# Patient Record
Sex: Male | Born: 1955 | Race: White | Hispanic: No | Marital: Married | Smoking: Former smoker
Health system: Southern US, Community
[De-identification: ages and names within clinical notes are randomized; demographics above are authoritative.]

## PROBLEM LIST (undated history)

## (undated) DIAGNOSIS — M1990 Unspecified osteoarthritis, unspecified site: Secondary | ICD-10-CM

## (undated) DIAGNOSIS — K279 Peptic ulcer, site unspecified, unspecified as acute or chronic, without hemorrhage or perforation: Secondary | ICD-10-CM

## (undated) DIAGNOSIS — I10 Essential (primary) hypertension: Secondary | ICD-10-CM

## (undated) DIAGNOSIS — W319XXA Contact with unspecified machinery, initial encounter: Secondary | ICD-10-CM

## (undated) DIAGNOSIS — D649 Anemia, unspecified: Secondary | ICD-10-CM

## (undated) DIAGNOSIS — C801 Malignant (primary) neoplasm, unspecified: Secondary | ICD-10-CM

## (undated) DIAGNOSIS — K219 Gastro-esophageal reflux disease without esophagitis: Secondary | ICD-10-CM

## (undated) DIAGNOSIS — K284 Chronic or unspecified gastrojejunal ulcer with hemorrhage: Secondary | ICD-10-CM

## (undated) DIAGNOSIS — K274 Chronic or unspecified peptic ulcer, site unspecified, with hemorrhage: Secondary | ICD-10-CM

## (undated) DIAGNOSIS — J189 Pneumonia, unspecified organism: Secondary | ICD-10-CM

## (undated) DIAGNOSIS — E43 Unspecified severe protein-calorie malnutrition: Secondary | ICD-10-CM

## (undated) DIAGNOSIS — G894 Chronic pain syndrome: Secondary | ICD-10-CM

## (undated) HISTORY — PX: JOINT REPLACEMENT: SHX530

## (undated) HISTORY — PX: TONGUE BIOPSY: SHX1075

## (undated) HISTORY — PX: LEG SURGERY: SHX1003

## (undated) HISTORY — PX: TONGUE SURGERY: SHX810

---

## 2000-11-16 ENCOUNTER — Inpatient Hospital Stay (HOSPITAL_COMMUNITY): Admission: EM | Admit: 2000-11-16 | Discharge: 2000-11-17 | Payer: Self-pay | Admitting: Emergency Medicine

## 2005-03-25 ENCOUNTER — Ambulatory Visit: Payer: Self-pay | Admitting: Otolaryngology

## 2005-03-26 ENCOUNTER — Other Ambulatory Visit: Payer: Self-pay

## 2005-03-27 ENCOUNTER — Inpatient Hospital Stay: Payer: Self-pay | Admitting: Otolaryngology

## 2005-04-10 ENCOUNTER — Ambulatory Visit: Payer: Self-pay | Admitting: Radiation Oncology

## 2005-05-10 ENCOUNTER — Ambulatory Visit: Payer: Self-pay | Admitting: Radiation Oncology

## 2005-05-26 ENCOUNTER — Emergency Department: Payer: Self-pay | Admitting: General Practice

## 2005-05-26 ENCOUNTER — Other Ambulatory Visit: Payer: Self-pay

## 2005-06-10 ENCOUNTER — Ambulatory Visit: Payer: Self-pay | Admitting: Radiation Oncology

## 2005-07-11 ENCOUNTER — Ambulatory Visit: Payer: Self-pay | Admitting: Radiation Oncology

## 2005-08-13 ENCOUNTER — Ambulatory Visit: Payer: Self-pay | Admitting: Radiation Oncology

## 2005-08-28 ENCOUNTER — Other Ambulatory Visit: Payer: Self-pay

## 2005-08-28 ENCOUNTER — Emergency Department: Payer: Self-pay | Admitting: Unknown Physician Specialty

## 2005-09-05 ENCOUNTER — Ambulatory Visit: Payer: Self-pay | Admitting: Radiation Oncology

## 2005-11-13 ENCOUNTER — Ambulatory Visit: Payer: Self-pay | Admitting: Radiation Oncology

## 2006-01-30 ENCOUNTER — Ambulatory Visit: Payer: Self-pay | Admitting: Radiation Oncology

## 2006-03-19 ENCOUNTER — Other Ambulatory Visit: Payer: Self-pay

## 2006-03-19 ENCOUNTER — Emergency Department: Payer: Self-pay | Admitting: Emergency Medicine

## 2006-03-24 ENCOUNTER — Ambulatory Visit: Payer: Self-pay | Admitting: Radiation Oncology

## 2006-04-03 ENCOUNTER — Ambulatory Visit: Payer: Self-pay | Admitting: Radiation Oncology

## 2006-04-10 ENCOUNTER — Ambulatory Visit: Payer: Self-pay | Admitting: Radiation Oncology

## 2006-05-25 ENCOUNTER — Ambulatory Visit: Payer: Self-pay | Admitting: Oncology

## 2006-06-10 ENCOUNTER — Ambulatory Visit: Payer: Self-pay | Admitting: Oncology

## 2006-08-17 ENCOUNTER — Inpatient Hospital Stay: Payer: Self-pay | Admitting: General Surgery

## 2006-09-15 ENCOUNTER — Ambulatory Visit: Payer: Self-pay | Admitting: Pain Medicine

## 2006-12-18 ENCOUNTER — Ambulatory Visit: Payer: Self-pay | Admitting: Radiation Oncology

## 2007-03-08 ENCOUNTER — Ambulatory Visit: Payer: Self-pay | Admitting: Radiation Oncology

## 2007-09-19 ENCOUNTER — Other Ambulatory Visit: Payer: Self-pay

## 2007-09-19 ENCOUNTER — Emergency Department: Payer: Self-pay | Admitting: Emergency Medicine

## 2007-12-15 ENCOUNTER — Inpatient Hospital Stay (HOSPITAL_COMMUNITY): Admission: RE | Admit: 2007-12-15 | Discharge: 2007-12-18 | Payer: Self-pay | Admitting: Orthopedic Surgery

## 2008-12-01 ENCOUNTER — Ambulatory Visit (HOSPITAL_COMMUNITY): Admission: RE | Admit: 2008-12-01 | Discharge: 2008-12-01 | Payer: Self-pay | Admitting: Orthopedic Surgery

## 2008-12-25 ENCOUNTER — Ambulatory Visit: Payer: Self-pay | Admitting: General Surgery

## 2008-12-28 ENCOUNTER — Ambulatory Visit: Payer: Self-pay | Admitting: General Surgery

## 2011-03-25 NOTE — Op Note (Signed)
NAME:  Grant Lee, Grant Lee         ACCOUNT NO.:  [id number]   MEDICAL RECORD NO.:  [id number]          PATIENT TYPE:  INP   LOCATION:  NA                           FACILITY:  [facility]   PHYSICIAN:  Ollen Gross, M.D.    DATE OF BIRTH:  November 29, 1955   DATE OF PROCEDURE:  12/15/2007  DATE OF DISCHARGE:                               OPERATIVE REPORT   PREOPERATIVE DIAGNOSIS:  Avascular necrosis, right hip.   POSTOPERATIVE DIAGNOSIS:  Avascular necrosis, right hip.   PROCEDURE:  Right total hip arthroplasty.   SURGEON:  Ollen Gross, M.D.   ASSISTANT:  Alexzandrew L. Perkins, P.A.C.   ANESTHESIA:  General.   ESTIMATED BLOOD LOSS:  400 mL.   DRAINS:  Hemovac x1.   COMPLICATIONS:  None.   CONDITION:  Stable to recovery.   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  He has failed nonoperative management and presents for  total hip arthroplasty.   PROCEDURE IN DETAIL:  After the successful administration of general  anesthetic, the patient was placed in the left lateral decubitus  position with the right side up and held with the hip positioner.  The  right lower extremity was isolated from his perineum with plastic drapes  and prepped and draped in the usual sterile fashion.  A short  posterolateral incision was made with a 10 blade through the  subcutaneous tissue to the level of fascia lata which was incised in  line with the skin incision.  The sciatic nerve was palpated and  protected and the short external rotators isolated off the femur.  Capsulectomy is performed and the hip was dislocated.  The center of the  femoral head is marked and a trial prosthesis placed such that the  center of the trial head corresponds to the center of the native femoral  head.  An osteotomy line was marked on the femoral neck  and osteotomy  made with an oscillating saw.  The femoral head removed and the femur  retracted anteriorly to gain acetabular exposure.   The acetabular retractors were placed and labrum and osteophytes  removed.  Reaming starts at 45 mm in coursing increments of 2 to 55 mm  and then a 56 mm Pinnacle acetabular shell was placed in anatomic  position and transfixed with two dome screws.  A trial 36 mm neutral +4  liner was placed.   The femur was prepared with the canal finder and irrigation.  Axial  reaming is performed to 17.5 mm with proximal reaming to 23F and the  sleeve machined to an extra/extra large.  A 23F extra/extra large sleeve  is placed with a 22 x 17 stem, a 36 plus 12 neck, about 10 degrees  beyond native anteversion.  A 36 plus 0 head is placed and the hip was  reduced.  Easy reduction was with a 36 plus 3 which had more appropriate  soft tissue tension.  She had fantastic stability with full extension,  full external rotation, 70 degrees flexion, 40 degrees adduction, 90  degrees internal rotation and 90 degrees of flexion, and 70 degrees of  internal rotation.  By placing the right leg on top of the left, it felt  as though the leg lengths were equal.  The hip was then dislocated and  trials removed.  The permanent apex hole eliminator and permanent 36 mm  neutral plus 4 Marathon liner was placed.  On the femoral side, the 2F  extra/extra large sleeve is placed with a 22 x 17 stem and 36 plus 12  neck 10 degrees beyond native anteversion.  A 36 plus 3 head is placed  and the hip was reduced with the same stability parameters.  The wounds  were copiously irrigated with saline solution and the short rotators  reattached to the femur through drill holes.  The fascia lata was closed  over a Hemovac drain with interrupted #1 Vicryl, the subcu closed with 1-  0 and 2-0 Vicryl and subcuticular running 4-0 Monocryl.  The drain was  hooked to suction.  The incision cleaned  and dried and Steri-Strips and  sterile dressing applied.  His right lower extremity was then placed  into a knee immobilizer.  He was awakened and transferred to recovery in  stable condition.      Ollen Gross, M.D.  Electronically Signed     FA/MEDQ  D:  12/15/2007  T:  12/16/2007  Job:  [id number]

## 2011-03-25 NOTE — H&P (Signed)
NAME:  Grant Lee, Grant Lee         ACCOUNT NO.:  [id number]   MEDICAL RECORD NO.:  [id number]          PATIENT TYPE:  INP   LOCATION:  NA                           [facility]ACILITY:  [facility]   PHYSICIAN:  Ollen Gross, M.D.    DATE OF BIRTH:  1956/08/06   DATE OF ADMISSION:  12/15/2007  DATE OF DISCHARGE:                              HISTORY & PHYSICAL   Date of office visit history and physical December 09, 2007.   CHIEF COMPLAINT:  Right hip pain.   HISTORY OF PRESENT ILLNESS:  The patient is a 55 year old male who has  seen by Dr. Lequita Halt for ongoing right hip and leg pain.  He is referred  over by Dr. Ethelene Hal for second opinion.  His problems date back to  industrial accident on the job for 2004 when his leg got crushed under a  heavy piece of equipment with very bad tibia and ankle fractures.  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  He has been treated with  injections per Dr. Isaiah Serge for the RSD.  He has had progressively  worsening problems with the hip above with loss of motion, difficulty  weightbearing.  He had a fall about a year and a half ago.  At that  point, he was told he had a previous fracture of his hip.  He was seen  in second opinion and found to have high-grade osteonecrosis with  collapse.  There was no obvious evidence of fracture, though.  It is  very possibly he had some has post-traumatic osteonecrosis in nature,  but given the appearance, I do feel this is more of a longstanding  process.  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  It is felt at this point the  only predictable way to improve his pain and function is hip  replacement.  Risks and benefits have been discussed, and he elects to  proceed with surgery.   ALLERGIES:  NO KNOWN DRUG ALLERGIES.   CURRENT MEDICATIONS:  Lisinopril and oxycodone.   PAST MEDICAL HISTORY:  1. Mouth/tongue cancer.  2. Hypertension.  3. Allergic rhinitis.  4. Hypercholesterolemia.  5. Gastroesophageal reflux disease.  6. Episodic/situational depression.  7. History of alcohol use.  8. History of tobacco use.   PAST SURGICAL HISTORY:  Multiple leg and ankle surgery secondary to  industrial accident between 2004 and 2007.  He also has had mouth  surgery in 2006 for oral/tongue cancer.   FAMILY HISTORY:  Heart disease and leukemia in his grandfather.   SOCIAL HISTORY:  Married, currently disabled.  Dipped for 18 years.  Still smokes about 15 cigarettes a day.  Has about 6-8 beers daily.  Two  children.  Wife will be assisting with care after surgery.   REVIEW OF SYSTEMS:  GENERAL:  No fevers, chills, or night sweats.  NEUROLOGICAL:  No seizures, syncope, or paralysis.  RESPIRATORY:  A  little bit of shortness breath on exertion but no shortness breath at  rest.  No productive cough or hemoptysis.  CARDIOVASCULAR:  No chest  pain, angina, orthopnea.  GASTROINTESTINAL:  No nausea, vomiting,  diarrhea, or constipation.  Does have reflux.  GENITOURINARY:  No  dysuria, hematuria, or discharge.  MUSCULOSKELETAL:  Right hip.   VITAL SIGNS:  Pulse 78, respirations 14, blood pressure 110/74.  GENERAL:  A 55 year old white male, tall frame, no acute distress.  He  is alert, oriented and cooperative.  HEENT: Normocephalic.  He has had previous mouth oral surgery.  EOMs  intact.  NECK:  Is supple.  CHEST:  Clear anterior and posterior chest walls.  HEART:  Regular rate and rhythm.  No murmur, S1-S2 noted.  ABDOMEN:  Soft, nontender.  Bowel sounds present.  RECTAL/BREAST/GENITALIA:  Not done, not pertinent to present illness.  EXTREMITIES:  Right hip:  He has about 90 degrees of flexion.  There is  zero internal rotation, zero external  rotation, about 10-20 degrees  abduction, but he does ambulate with an antalgic gait.   IMPRESSION:  1. Osteonecrosis/avascular necrosis of the right hip.  2. History of tongue cancer.  3. Hypertension.  4. Allergic rhinitis.  5. Hypercholesterolemia.  6. Gastroesophageal reflux disease.  7. Episodic/situational depression.  8. History of alcohol use.  9. History of tobacco use.   PLAN:  The patient was admitted to [facility] to undergo a  right total replacement arthroplasty.  Surgery will be performed by Dr.  Ollen Gross.      Alexzandrew L. Perkins, P.A.C.      Ollen Gross, M.D.  Electronically Signed    ALP/MEDQ  D:  12/14/2007  T:  12/15/2007  Job:  [id number]   cc:   Ollen Gross, M.D.  Fax: [phone]   Lonie Peak, P.A.-C.  [facility]  [facility]  [address]  [address], [place], [place] [address]  [phone]

## 2011-03-28 NOTE — Discharge Summary (Signed)
[facility]  Patient:    Grant Lee, Grant Lee                  MRN: [id number] Adm. Date:  [id number] Disc. Date: 11/17/00 Attending:  Junious Silk Dictator:   Tereso Newcomer, P.A.                           Discharge Summary  DATE OF BIRTH:  1956-04-30  REASON FOR ADMISSION:  Chest pain.  PROCEDURES PERFORMED THIS ADMISSION: 1. Gaited exercise treadmill Cardiolite.  The patient exercised for 7 minutes    47 seconds with a maximum heart rate of 157.  Images revealed no ischemia,    no scarring, EF 44%. 2. Duplex carotid ultrasound revealed no significant ICA stenosis bilaterally.    Anterior vertebral artery flow bilaterally.  DISCHARGE DIAGNOSES: 1. Chest pain. 2. Hypertension. 3. Positive tobacco use. 4. Positive cocaine use and positive marijuana use. 5. Probable gastroesophageal reflux disease. 6. Alcohol abuse.  The patient drinks a six-pack of beer per day.  ADMISSION HISTORY:  This 55 year old male with no known CAD was admitted on November 16, 2000, with substernal chest pain for three days.  He describes it as indigestion associated with intermittent nausea, shortness of breath, and sweats.  His symptoms were better after nitroglycerin in the emergency room. His EKG was without acute changes.  ALLERGIES:  No known drug allergies.  INITIAL PHYSICAL EXAMINATION:  GENERAL:  Well-developed, well-nourished male in no acute distress.  VITAL SIGNS:  Blood pressure 139/94, pulse 80, temperature 98.3.  NECK:  Without bruits or JVD.  CHEST:  Clear.  CARDIAC: Regular rate and rhythm without murmurs or rubs.  Normal S1 and S2.  ABDOMEN:  Nontender.  EXTREMITIES:  Without edema.  HOSPITAL COURSE:  The patient was admitted for chest pain.  He denied any exertional chest pain or shortness of breath.  He noted that, over the past three days, he had chest pain in the left chest and numbness in his left upper extremity as well  as intermittent diaphoresis.  He denied any pleuritic or positional pain or pain related to food.  He was ruled out for myocardial infarction by enzymes.  Carotid Dopplers were checked due to his left upper extremity numbness, and these were negative for any internal artery stenosis as noted above.  On November 27, 2000, he went for his treadmill Cardiolite test.  The images were negative as noted above.  Due to the negative Cardiolite and carotid Dopplers, it was decided he was stable enough for discharge to home.  The patient was counselled on the importance of stopping tobacco, cocaine, and alcohol abuse.  He was also advised that he should find a primary care physician, and he should probably see this physician within one to two weeks after discharge.  He will go home on Protonix as well as his Avapro for hypertension and a baby aspirin a day.  LABORATORY DATA:  White count 8300, hemoglobin 14.9, hematocrit 43.6, platelet count 304,000.  Sodium 137, potassium 3.8, chloride 106, CO2 21, BUN 10, creatinine 1.1, glucose 100, total protein 6.3, albumin 3.4, AST 58, ALT 53, alkaline phosphatase 84.  INR 0.8.  TSH 1.849.  Urine drug screen positive for THC, positive for cocaine.  DISCHARGE MEDICATIONS: 1. Avapro 150 mg q.d. 2. Protonix 40 mg q.d. 3. Coated aspirin 81 mg q.d.  ACTIVITY:  As tolerated.  DIET:  Low fat, low  sodium.  WOUND CARE:  Not applicable.  FOLLOWUP:  The patient has been advised that he should seek out a family doctor and see that physician in one to two weeks.  He lives near [place]. He knows of a few physicians in that area that he may ask for.  He has been advised to call our internal medicine office in [place] if he is unable to find a physician, and he has been provided with that phone number.  He will need to follow up for probable GERD to assess his response to Protonix. DD:  11/17/00 TD:  11/17/00 Job: [id number]

## 2011-03-28 NOTE — Discharge Summary (Signed)
NAME:  Grant Lee, Grant Lee         ACCOUNT NO.:  [id number]   MEDICAL RECORD NO.:  [id number]          PATIENT TYPE:  INP   LOCATION:  1535                         FACILITY:  [facility]   PHYSICIAN:  Ollen Gross, M.D.    DATE OF BIRTH:  11-Jan-1956   DATE OF ADMISSION:  12/15/2007  DATE OF DISCHARGE:  12/18/2007                               DISCHARGE SUMMARY   ADMITTING DIAGNOSES:  1. Osteonecrosis or avascular necrosis, right hip.  2. History of tongue cancer.  3. Hypertension.  4. Allergic rhinitis.  5. Hypercholesterolemia.  6. Gastroesophageal reflux disease.  7. Episodic situational depression.  8. History of alcohol use.  9. History tobacco use.   DISCHARGE DIAGNOSES:  1. Avascular necrosis, right hip, status post right total hip      arthroplasty.  2. Postoperative blood loss anemia, did not require transfusion.  3. Postoperative hyponatremia, improved.  4. History of tongue cancer.  5. Hypertension.  6. Allergic rhinitis.  7. Hypercholesterolemia.  8. Gastroesophageal reflux disease.  9. Episodic situational depression.  10.History of alcohol use.  11.History tobacco use.   PROCEDURE:  December 15, 2007, right total hip.  Surgeon: Dr. Lequita Halt.  Assistant:  Avel Peace, PA-C.  Anesthesia:  General.   CONSULTATIONS:  None.Marland Kitchen   BRIEF HISTORY:  Grant Lee is a 55 year old male who developed severe  osteonecrosis of the right hip posttraumatic in origin and now has  severe right hip pain and dysfunction.  Failed nonoperative management,  now presents for total arthroplasty.   LABORATORY DATA:  Preop CBC showed hemoglobin of 14.8, hematocrit 44.3,  white cell count 7.4, red cell count 4.45, platelets 287.  Chemistry  panel on admission all within normal limits.  Serial CBCs were followed.  Hemoglobin did drop down a 10.3-9.8.  Last hemoglobin 9.3, hematocrit  26.4.  BMET followed.  Sodium did drop down to 133, back up to 136.  Preop PT 12.8, INR 0.9 with a PTT of 31.   Serial pro times followed.  Last PT 19, INR 1.6.  Preop UA negative.  Blood group type O+.   EKG dated December 07, 2007:  Normal sinus rhythm, normal axis, no acute  ST changes.  Poor R-wave progression is chronic   HOSPITAL COURSE:  The patient was admitted to [facility] and  tolerated procedure well, later transferred to the recovery room and  then orthopedic floor. Started on PCA and p.o. analgesic for pain  control following surgery. Did not get much sleep on the evening of  surgery due to pain.  He could tell, though, the deep pain that he had  preop was better, Left his PCA for one more day due to the pain level.  Hemovac drain placed under the surgery was pulled.  Started out partial  weightbearing 25-50%.  We held his ACE inhibitor. Would resume it if  there was any increase in his pressure.  He was normotensive postop with  a systolic of 117. Had a history of alcohol abuse, so we added p.r.n.  medications for that.  Had a little bit of low sodium, had good output,  so we decreased his  fluids. By day #2, the sodium had improved.  Hemoglobin was 9.8.  He was asymptomatic.  Incision looked good after  the dressing change.  Blood pressure remained stable with systolic of  116. Excellent urinary output. Continued to progress well with  physical  therapy, walking greater than 200 feet, did extremely well.  Continued  to progress, and by day #3 was meeting goals, tolerating his medications  and was discharged home.   DISCHARGE PLAN:  1. The patient was discharged home on December 18, 2007.  2. Discharge diagnoses:  Please see above.  3. Discharge medications:  Percocet, Robaxin, Coumadin, Nu-Iron/  4. Activity:  Partial weightbearing 25 50% right leg. Hip precautions.      Total hip protocol.  Do not submerge incision under water.  5. Follow up with Dr. Lequita Halt 2 weeks.   DISPOSITION:  Home.   CONDITION ON DISCHARGE:  Improved.      Grant Lee,  P.A.C.      Ollen Gross, M.D.  Electronically Signed    ALP/MEDQ  D:  01/18/2008  T:  01/19/2008  Job:  [id number]   cc:   Lonie Peak, PA-C  [facility]  [facility]  [address]  [place], [place] [address]

## 2011-07-31 LAB — URINALYSIS, ROUTINE W REFLEX MICROSCOPIC
Glucose, UA: NEGATIVE
Ketones, ur: NEGATIVE
Nitrite: NEGATIVE
Protein, ur: NEGATIVE

## 2011-07-31 LAB — ABO/RH: ABO/RH(D): O POS

## 2011-07-31 LAB — TYPE AND SCREEN: ABO/RH(D): O POS

## 2011-07-31 LAB — APTT: aPTT: 31

## 2011-08-01 LAB — PROTIME-INR
INR: 1.1
INR: 1.6 — ABNORMAL HIGH
INR: 1.7 — ABNORMAL HIGH
Prothrombin Time: 14.7
Prothrombin Time: 20.9 — ABNORMAL HIGH

## 2011-08-01 LAB — BASIC METABOLIC PANEL
BUN: 4 — ABNORMAL LOW
CO2: 26
Calcium: 8.2 — ABNORMAL LOW
Calcium: 8.2 — ABNORMAL LOW
Creatinine, Ser: 0.73
Creatinine, Ser: 0.79
GFR calc Af Amer: >60
GFR calc non Af Amer: >60
GFR calc non Af Amer: >60
Glucose, Bld: 105 — ABNORMAL HIGH
Potassium: 3.8
Sodium: 136

## 2011-08-01 LAB — CBC
Hemoglobin: 9.3 — ABNORMAL LOW
Hemoglobin: 9.8 — ABNORMAL LOW
MCHC: 35.4
Platelets: 245
RBC: 2.85 — ABNORMAL LOW
RDW: 13
RDW: 13.2
WBC: 5.3
WBC: 8.4
WBC: 8.5

## 2011-09-10 ENCOUNTER — Emergency Department: Payer: Self-pay | Admitting: *Deleted

## 2011-09-30 ENCOUNTER — Inpatient Hospital Stay: Payer: Self-pay | Admitting: Internal Medicine

## 2011-10-06 LAB — PATHOLOGY REPORT

## 2011-12-01 ENCOUNTER — Ambulatory Visit: Payer: Self-pay | Admitting: Physician Assistant

## 2012-08-28 ENCOUNTER — Emergency Department: Payer: Self-pay | Admitting: Emergency Medicine

## 2012-10-01 ENCOUNTER — Emergency Department: Payer: Self-pay | Admitting: Emergency Medicine

## 2013-01-24 ENCOUNTER — Ambulatory Visit: Payer: Self-pay | Admitting: Pain Medicine

## 2013-06-04 ENCOUNTER — Emergency Department: Payer: Self-pay | Admitting: Emergency Medicine

## 2013-06-04 LAB — URINALYSIS, COMPLETE
Bacteria: NONE SEEN
Blood: NEGATIVE
Hyaline Cast: 4
Ph: 5 (ref 4.5–8.0)
RBC,UR: 1 /HPF (ref 0–5)

## 2013-06-04 LAB — COMPREHENSIVE METABOLIC PANEL
Albumin: 3.6 g/dL (ref 3.4–5.0)
Anion Gap: 4 — ABNORMAL LOW (ref 7–16)
Bilirubin,Total: 0.3 mg/dL (ref 0.2–1.0)
Calcium, Total: 9 mg/dL (ref 8.5–10.1)
Co2: 28 mmol/L (ref 21–32)
EGFR (African American): >60
Glucose: 106 mg/dL — ABNORMAL HIGH (ref 65–99)

## 2013-06-04 LAB — CBC
MCH: 31 pg (ref 26.0–34.0)
MCV: 98 fL (ref 80–100)
Platelet: 267 10*3/uL (ref 150–440)

## 2013-07-19 ENCOUNTER — Ambulatory Visit: Payer: Self-pay | Admitting: Physician Assistant

## 2014-02-20 ENCOUNTER — Emergency Department: Payer: Self-pay | Admitting: Emergency Medicine

## 2014-02-20 LAB — CBC WITH DIFFERENTIAL/PLATELET
BASOS ABS: 0.3 10*3/uL — AB (ref 0.0–0.1)
BASOS PCT: 2.8 %
EOS PCT: 0 %
Eosinophil #: 0 10*3/uL (ref 0.0–0.7)
HCT: 42.6 % (ref 40.0–52.0)
HGB: 13.5 g/dL (ref 13.0–18.0)
Lymphocyte #: 0.3 10*3/uL — ABNORMAL LOW (ref 1.0–3.6)
Lymphocyte %: 2.3 %
MCH: 31.5 pg (ref 26.0–34.0)
MCHC: 31.8 g/dL — AB (ref 32.0–36.0)
MCV: 99 fL (ref 80–100)
MONO ABS: 0.9 x10 3/mm (ref 0.2–1.0)
MONOS PCT: 8 %
NEUTROS PCT: 86.9 %
Neutrophil #: 10.2 10*3/uL — ABNORMAL HIGH (ref 1.4–6.5)
Platelet: 245 10*3/uL (ref 150–440)
RBC: 4.29 10*6/uL — ABNORMAL LOW (ref 4.40–5.90)
RDW: 13.5 % (ref 11.5–14.5)
WBC: 11.8 10*3/uL — AB (ref 3.8–10.6)

## 2014-02-20 LAB — COMPREHENSIVE METABOLIC PANEL
ALK PHOS: 89 U/L
ALT: 45 U/L (ref 12–78)
ANION GAP: 10 (ref 7–16)
Albumin: 3.6 g/dL (ref 3.4–5.0)
BUN: 6 mg/dL — ABNORMAL LOW (ref 7–18)
Bilirubin,Total: 0.6 mg/dL (ref 0.2–1.0)
CHLORIDE: 102 mmol/L (ref 98–107)
CO2: 20 mmol/L — AB (ref 21–32)
CREATININE: 0.77 mg/dL (ref 0.60–1.30)
Calcium, Total: 8.7 mg/dL (ref 8.5–10.1)
EGFR (African American): >60
EGFR (Non-African Amer.): >60
Glucose: 106 mg/dL — ABNORMAL HIGH (ref 65–99)
Osmolality: 263 (ref 275–301)
POTASSIUM: 3.7 mmol/L (ref 3.5–5.1)
SGOT(AST): 56 U/L — ABNORMAL HIGH (ref 15–37)
Sodium: 132 mmol/L — ABNORMAL LOW (ref 136–145)
TOTAL PROTEIN: 7.9 g/dL (ref 6.4–8.2)

## 2014-02-20 LAB — URINALYSIS, COMPLETE
Bacteria: NONE SEEN
Bilirubin,UR: NEGATIVE
Blood: NEGATIVE
GLUCOSE, UR: NEGATIVE mg/dL (ref 0–75)
Leukocyte Esterase: NEGATIVE
NITRITE: NEGATIVE
PH: 6 (ref 4.5–8.0)
PROTEIN: NEGATIVE
RBC, UR: NONE SEEN /HPF (ref 0–5)
SPECIFIC GRAVITY: 1.015 (ref 1.003–1.030)
Squamous Epithelial: <1
WBC UR: 2 /HPF (ref 0–5)

## 2014-02-20 LAB — LIPASE, BLOOD: LIPASE: 88 U/L (ref 73–393)

## 2014-02-21 LAB — CLOSTRIDIUM DIFFICILE(ARMC)

## 2014-03-23 ENCOUNTER — Ambulatory Visit: Payer: Self-pay | Admitting: Physician Assistant

## 2014-10-09 DIAGNOSIS — R634 Abnormal weight loss: Secondary | ICD-10-CM | POA: Insufficient documentation

## 2014-10-19 ENCOUNTER — Ambulatory Visit: Payer: Self-pay | Admitting: Unknown Physician Specialty

## 2015-01-22 ENCOUNTER — Ambulatory Visit: Payer: Self-pay | Admitting: Orthopedic Surgery

## 2015-01-24 ENCOUNTER — Emergency Department: Payer: Self-pay | Admitting: Emergency Medicine

## 2015-02-15 DIAGNOSIS — Z96641 Presence of right artificial hip joint: Secondary | ICD-10-CM | POA: Diagnosis not present

## 2015-02-15 DIAGNOSIS — M25551 Pain in right hip: Secondary | ICD-10-CM | POA: Diagnosis not present

## 2015-02-21 DIAGNOSIS — G90521 Complex regional pain syndrome I of right lower limb: Secondary | ICD-10-CM | POA: Diagnosis not present

## 2015-02-21 DIAGNOSIS — Z1389 Encounter for screening for other disorder: Secondary | ICD-10-CM | POA: Diagnosis not present

## 2015-02-21 DIAGNOSIS — I10 Essential (primary) hypertension: Secondary | ICD-10-CM | POA: Diagnosis not present

## 2015-02-21 DIAGNOSIS — M25559 Pain in unspecified hip: Secondary | ICD-10-CM | POA: Diagnosis not present

## 2015-02-21 DIAGNOSIS — F418 Other specified anxiety disorders: Secondary | ICD-10-CM | POA: Diagnosis not present

## 2015-03-26 DIAGNOSIS — Z681 Body mass index (BMI) 19 or less, adult: Secondary | ICD-10-CM | POA: Diagnosis not present

## 2015-03-26 DIAGNOSIS — J019 Acute sinusitis, unspecified: Secondary | ICD-10-CM | POA: Diagnosis not present

## 2015-03-26 DIAGNOSIS — G90521 Complex regional pain syndrome I of right lower limb: Secondary | ICD-10-CM | POA: Diagnosis not present

## 2015-04-11 DIAGNOSIS — C029 Malignant neoplasm of tongue, unspecified: Secondary | ICD-10-CM | POA: Diagnosis not present

## 2015-04-11 DIAGNOSIS — B370 Candidal stomatitis: Secondary | ICD-10-CM | POA: Diagnosis not present

## 2015-04-11 DIAGNOSIS — Z681 Body mass index (BMI) 19 or less, adult: Secondary | ICD-10-CM | POA: Diagnosis not present

## 2015-04-11 DIAGNOSIS — G4700 Insomnia, unspecified: Secondary | ICD-10-CM | POA: Diagnosis not present

## 2015-04-11 DIAGNOSIS — J019 Acute sinusitis, unspecified: Secondary | ICD-10-CM | POA: Diagnosis not present

## 2015-05-18 DIAGNOSIS — G90521 Complex regional pain syndrome I of right lower limb: Secondary | ICD-10-CM | POA: Diagnosis not present

## 2015-05-18 DIAGNOSIS — Z681 Body mass index (BMI) 19 or less, adult: Secondary | ICD-10-CM | POA: Diagnosis not present

## 2015-05-18 DIAGNOSIS — K140 Glossitis: Secondary | ICD-10-CM | POA: Diagnosis not present

## 2015-05-18 DIAGNOSIS — I10 Essential (primary) hypertension: Secondary | ICD-10-CM | POA: Diagnosis not present

## 2015-05-18 DIAGNOSIS — J329 Chronic sinusitis, unspecified: Secondary | ICD-10-CM | POA: Diagnosis not present

## 2015-05-28 DIAGNOSIS — K121 Other forms of stomatitis: Secondary | ICD-10-CM | POA: Diagnosis not present

## 2015-05-28 DIAGNOSIS — C024 Malignant neoplasm of lingual tonsil: Secondary | ICD-10-CM | POA: Diagnosis not present

## 2015-06-11 ENCOUNTER — Inpatient Hospital Stay: Payer: Medicare Other | Attending: Oncology | Admitting: Oncology

## 2015-06-11 ENCOUNTER — Encounter: Payer: Self-pay | Admitting: Oncology

## 2015-06-11 ENCOUNTER — Inpatient Hospital Stay: Payer: Medicare Other

## 2015-06-11 ENCOUNTER — Other Ambulatory Visit: Payer: Self-pay | Admitting: *Deleted

## 2015-06-11 VITALS — BP 152/100 | HR 71 | Temp 95.8°F | Wt 142.9 lb

## 2015-06-11 DIAGNOSIS — C029 Malignant neoplasm of tongue, unspecified: Secondary | ICD-10-CM

## 2015-06-11 DIAGNOSIS — E860 Dehydration: Secondary | ICD-10-CM | POA: Insufficient documentation

## 2015-06-11 DIAGNOSIS — K269 Duodenal ulcer, unspecified as acute or chronic, without hemorrhage or perforation: Secondary | ICD-10-CM | POA: Insufficient documentation

## 2015-06-11 DIAGNOSIS — Z79899 Other long term (current) drug therapy: Secondary | ICD-10-CM | POA: Insufficient documentation

## 2015-06-11 DIAGNOSIS — Z85810 Personal history of malignant neoplasm of tongue: Secondary | ICD-10-CM | POA: Diagnosis not present

## 2015-06-11 DIAGNOSIS — Z923 Personal history of irradiation: Secondary | ICD-10-CM | POA: Insufficient documentation

## 2015-06-11 DIAGNOSIS — I10 Essential (primary) hypertension: Secondary | ICD-10-CM | POA: Diagnosis not present

## 2015-06-11 DIAGNOSIS — R948 Abnormal results of function studies of other organs and systems: Secondary | ICD-10-CM | POA: Diagnosis not present

## 2015-06-11 DIAGNOSIS — R1310 Dysphagia, unspecified: Secondary | ICD-10-CM | POA: Diagnosis not present

## 2015-06-11 DIAGNOSIS — Z87891 Personal history of nicotine dependence: Secondary | ICD-10-CM | POA: Insufficient documentation

## 2015-06-11 DIAGNOSIS — K219 Gastro-esophageal reflux disease without esophagitis: Secondary | ICD-10-CM | POA: Diagnosis not present

## 2015-06-11 DIAGNOSIS — E780 Pure hypercholesterolemia: Secondary | ICD-10-CM | POA: Insufficient documentation

## 2015-06-11 DIAGNOSIS — C024 Malignant neoplasm of lingual tonsil: Secondary | ICD-10-CM | POA: Diagnosis not present

## 2015-06-11 DIAGNOSIS — R42 Dizziness and giddiness: Secondary | ICD-10-CM | POA: Diagnosis not present

## 2015-06-11 DIAGNOSIS — K121 Other forms of stomatitis: Secondary | ICD-10-CM | POA: Diagnosis not present

## 2015-06-11 LAB — CBC WITH DIFFERENTIAL/PLATELET
Basophils Absolute: 0 10*3/uL (ref 0–0.1)
Basophils Relative: 0 %
EOS PCT: 1 %
Eosinophils Absolute: 0 10*3/uL (ref 0–0.7)
HCT: 40.6 % (ref 40.0–52.0)
HEMOGLOBIN: 13.2 g/dL (ref 13.0–18.0)
LYMPHS ABS: 1.2 10*3/uL (ref 1.0–3.6)
Lymphocytes Relative: 20 %
MCH: 32.9 pg (ref 26.0–34.0)
MCHC: 32.5 g/dL (ref 32.0–36.0)
MCV: 101.1 fL — AB (ref 80.0–100.0)
Monocytes Absolute: 0.7 10*3/uL (ref 0.2–1.0)
Monocytes Relative: 12 %
NEUTROS PCT: 67 %
Neutro Abs: 3.9 10*3/uL (ref 1.4–6.5)
Platelets: 351 10*3/uL (ref 150–440)
RBC: 4.02 MIL/uL — AB (ref 4.40–5.90)
RDW: 15.6 % — AB (ref 11.5–14.5)
WBC: 5.9 10*3/uL (ref 3.8–10.6)

## 2015-06-11 LAB — COMPREHENSIVE METABOLIC PANEL
ALK PHOS: 104 U/L (ref 38–126)
ALT: 18 U/L (ref 17–63)
ANION GAP: 7 (ref 5–15)
AST: 37 U/L (ref 15–41)
Albumin: 3.1 g/dL — ABNORMAL LOW (ref 3.5–5.0)
CALCIUM: 8.5 mg/dL — AB (ref 8.9–10.3)
CHLORIDE: 99 mmol/L — AB (ref 101–111)
CO2: 28 mmol/L (ref 22–32)
Creatinine, Ser: 0.72 mg/dL (ref 0.61–1.24)
GFR calc Af Amer: >60 mL/min (ref >60)
GLUCOSE: 107 mg/dL — AB (ref 65–99)
Potassium: 4.8 mmol/L (ref 3.5–5.1)
SODIUM: 134 mmol/L — AB (ref 135–145)
Total Bilirubin: 0.3 mg/dL (ref 0.3–1.2)
Total Protein: 7.3 g/dL (ref 6.5–8.1)

## 2015-06-11 LAB — TSH: TSH: 1.02 u[IU]/mL (ref 0.350–4.500)

## 2015-06-11 MED ORDER — SODIUM CHLORIDE 0.9 % IV SOLN
10.0000 mg | Freq: Once | INTRAVENOUS | Status: AC
  Administered 2015-06-11: 10 mg via INTRAVENOUS
  Filled 2015-06-11: qty 1

## 2015-06-11 MED ORDER — SODIUM CHLORIDE 0.9 % IV SOLN
Freq: Once | INTRAVENOUS | Status: AC
  Administered 2015-06-11: 12:00:00 via INTRAVENOUS
  Filled 2015-06-11: qty 1000

## 2015-06-11 NOTE — Progress Notes (Signed)
[place] @ [facility] [phone]  Fax:(336) [place] OB: 1955-12-28  MR#: [id number]  MHW#:[id number]  Patient Care Team: Cyndi Bender, PA-C as PCP - General (Physician Assistant)  CHIEF COMPLAINT:  Chief Complaint  Patient presents with  . New Evaluation   1.  Has a history of cancer of the tongue in 2006.  Patient underwent resection followed by radiation therapy 2.  Increasing difficulty in swallowing for last 2 or 3 weeks.  Patient had upper endoscopy done in December of 2015. 3.  Significant weight loss   VISIT DIAGNOSIS:  Significant weight loss. Difficulty swallowing. History of carcinoma of tongue    Oncology Flowsheet 06/11/2015  dexamethasone (DECADRON) IV 10 mg    INTERVAL HISTORY:  I received a phone call from ENT surgeon to see this 59 year old gentleman who had been seen by me several years ago.  Patient had a history of carcinoma of lung (exact state not known) old chart being reviewed.  At present old records are not available on EMR. According to patient he had been doing fairly good.  He quit smoking in November.  December had upper endoscopy because of difficulty swallowing.  No abnormality detected patient underwent esophageal dilation. Recently he has increasing difficulty in swallowing but that is in right at the level off larynx and beginning of esophagus. Has not lost significant amount of weight.  Soreness in the mouth.  Patient has been treated with antifungal antibiotics without much relief.  Patient has lost approximately 4050 pounds of weight. Also has increasing cough.  Yellowish expectoration.  Patient had been a chronic smoker and quit smoking in November of 2015  REVIEW OF SYSTEMS:   Gen. status: Patient is feeling weak and tired.  Has lost significant weight Poor appetite Increasing difficulty swallowing as mentioned in history of present illness HEENT: As mentioned in history of present  illness patient had history of carcinoma of tongue status post radiation therapy recently having increasing soreness in the mouth. Lungs: Increasing cough shortness of breath yellowish expectoration no fever no hemoptysis GI no nausea no vomiting no diarrhea GU no dysuria hematuria skin: No rash neurological system no tingling numbness.. [place] close skeletal system no bony pain  As per HPI. Otherwise, a complete review of systems is negatve.  PAST MEDICAL HISTORY: Carcinoma of tongue Hypertension Hypercholesterolemia Previous substance abuse Gastroesophageal reflux disease  PAST SURGICAL HISTORY: Treated for carcinoma of tongue FAMILY HISTORY There is no significant family history of breast cancer, ovarian cancer, colon cancer    ADVANCED DIRECTIVES:  Patient does not have any living will or healthcare power of attorney.  Information was given .  Available resources had been discussed.  We will follow-up on subsequent appointments regarding this issue  HEALTH MAINTENANCE: History  Substance Use Topics  . Smoking status: Former Research scientist (life sciences)  . Smokeless tobacco: Not on file  . Alcohol Use: Not on file    Quit smoking in November of 2015.  History of smoking for several years in the past.  No Known Allergies  Current Outpatient Prescriptions  Medication Sig Dispense Refill  . lisinopril (PRINIVIL,ZESTRIL) 10 MG tablet 10 mg once daily.    Marland Kitchen omeprazole (PRILOSEC) 40 MG capsule 40 mg once daily.    . Oxycodone HCl 20 MG TABS 20 mg every 6 (six) hours.    . pravastatin (PRAVACHOL) 40 MG tablet 40 mg once daily.    . traZODone (DESYREL) 50 MG tablet 50 mg nightly.  No current facility-administered medications for this visit.    OBJECTIVE: PHYSICAL EXAM: Gen. status: Patient is seen lean and cachectic. HEENT: No soreness in the mouth.  But swelling which is diffuse Lymphatic system: Supraclavicular, cervical, axillary, inguinal lymph nodes are not palpable Lungs: Bilateral  rhonchi and occasional crepitation. Cardiac: Tachycardia Examination of the skin revealed no evidence of significant rashes, suspicious appearing nevi or other concerning lesions.. Abdominal exam revealed normal bowel sounds. The abdomen was soft, non-tender, and without masses, organomegaly, or appreciable enlargement of the abdominal aorta.. Psychiatric system: Depression and anxiety  Filed Vitals:   06/11/15 1053  BP: 152/100  Pulse: 71  Temp: 95.8 F (35.4 C)     There is no height on file to calculate BMI.    ECOG FS:1 - Symptomatic but completely ambulatory  LAB RESULTS:  Appointment on 06/11/2015  Component Date Value Ref Range Status  . WBC 06/11/2015 5.9  3.8 - 10.6 K/uL Final  . RBC 06/11/2015 4.02* 4.40 - 5.90 MIL/uL Final  . Hemoglobin 06/11/2015 13.2  13.0 - 18.0 g/dL Final  . HCT 06/11/2015 40.6  40.0 - 52.0 % Final  . MCV 06/11/2015 101.1* 80.0 - 100.0 fL Final  . MCH 06/11/2015 32.9  26.0 - 34.0 pg Final  . MCHC 06/11/2015 32.5  32.0 - 36.0 g/dL Final  . RDW 06/11/2015 15.6* 11.5 - 14.5 % Final  . Platelets 06/11/2015 351  150 - 440 K/uL Final  . Neutrophils Relative % 06/11/2015 67   Final  . Neutro Abs 06/11/2015 3.9  1.4 - 6.5 K/uL Final  . Lymphocytes Relative 06/11/2015 20   Final  . Lymphs Abs 06/11/2015 1.2  1.0 - 3.6 K/uL Final  . Monocytes Relative 06/11/2015 12   Final  . Monocytes Absolute 06/11/2015 0.7  0.2 - 1.0 K/uL Final  . Eosinophils Relative 06/11/2015 1   Final  . Eosinophils Absolute 06/11/2015 0.0  0 - 0.7 K/uL Final  . Basophils Relative 06/11/2015 0   Final  . Basophils Absolute 06/11/2015 0.0  0 - 0.1 K/uL Final  . Sodium 06/11/2015 134* 135 - 145 mmol/L Final  . Potassium 06/11/2015 4.8  3.5 - 5.1 mmol/L Final  . Chloride 06/11/2015 99* 101 - 111 mmol/L Final  . CO2 06/11/2015 28  22 - 32 mmol/L Final  . Glucose, Bld 06/11/2015 107* 65 - 99 mg/dL Final  . BUN 06/11/2015 <5* 6 - 20 mg/dL Final  . Creatinine, Ser 06/11/2015 0.72  0.61  - 1.24 mg/dL Final  . Calcium 06/11/2015 8.5* 8.9 - 10.3 mg/dL Final  . Total Protein 06/11/2015 7.3  6.5 - 8.1 g/dL Final  . Albumin 06/11/2015 3.1* 3.5 - 5.0 g/dL Final  . AST 06/11/2015 37  15 - 41 U/L Final  . ALT 06/11/2015 18  17 - 63 U/L Final  . Alkaline Phosphatase 06/11/2015 104  38 - 126 U/L Final  . Total Bilirubin 06/11/2015 0.3  0.3 - 1.2 mg/dL Final  . GFR calc non Af Amer 06/11/2015 >60  >60 mL/min Final  . GFR calc Af Amer 06/11/2015 >60  >60 mL/min Final   Comment: (NOTE) The eGFR has been calculated using the CKD EPI equation. This calculation has not been validated in all clinical situations. eGFR's persistently <60 mL/min signify possible Chronic Kidney Disease.   . Anion gap 06/11/2015 7  5 - 15 Final  . TSH 06/11/2015 1.020  0.350 - 4.500 uIU/mL Final       ASSESSMENT:  1.  Carcinoma of tongue in 2006 status post resection and radiation therapy exit staging not known and old records being off pain for review 2.  Difficulty in swallowing 2-3 weeks duration.  Had a normal upper endoscopy in December of 2015 with esophageal dilated Patient history of peptic ulcer disease 3, significant weight loss 4, previous history of chronic drug abuse.  Chronic tobacco abuse  PLAN:   Patient may be mildly dehydrated so intravenous fluids.  Intravenous steroid and nausea medication was given.   PET scan for complete restaging evaluation  If needed upper endoscopy will be repeated  Patient expressed understanding and was in agreement with this plan. He also understands that He can call clinic at any time with any questions, concerns, or complaints.    No matching staging information was found for the patient.  Forest Gleason, MD   06/11/2015 1:24 PM

## 2015-06-11 NOTE — Progress Notes (Signed)
Patient referred by Dr. Tami Ribas regarding ongoing sore throat for 2 weeks.  Patient states he is unable to eat and has lost 40 lbs in past 7 mos.  Patient does not have living will.  Declined information.  Former smoker.  States he stopped in November 2015.

## 2015-06-12 LAB — T4: T4, Total: 5.9 ug/dL (ref 4.5–12.0)

## 2015-06-13 ENCOUNTER — Ambulatory Visit: Payer: Self-pay | Admitting: Oncology

## 2015-06-14 ENCOUNTER — Ambulatory Visit
Admission: RE | Admit: 2015-06-14 | Discharge: 2015-06-14 | Disposition: A | Discharge status: Home / Self Care | Payer: Medicare Other | Source: Ambulatory Visit | Attending: Oncology | Admitting: Oncology

## 2015-06-14 DIAGNOSIS — C029 Malignant neoplasm of tongue, unspecified: Secondary | ICD-10-CM | POA: Insufficient documentation

## 2015-06-14 DIAGNOSIS — C024 Malignant neoplasm of lingual tonsil: Secondary | ICD-10-CM | POA: Diagnosis not present

## 2015-06-14 DIAGNOSIS — C760 Malignant neoplasm of head, face and neck: Secondary | ICD-10-CM | POA: Diagnosis not present

## 2015-06-14 LAB — GLUCOSE, CAPILLARY: GLUCOSE-CAPILLARY: 71 mg/dL (ref 65–99)

## 2015-06-14 MED ORDER — FLUDEOXYGLUCOSE F - 18 (FDG) INJECTION
13.1100 | Freq: Once | INTRAVENOUS | Status: AC | PRN
  Administered 2015-06-14: 13.11 via INTRAVENOUS

## 2015-06-18 ENCOUNTER — Inpatient Hospital Stay (HOSPITAL_BASED_OUTPATIENT_CLINIC_OR_DEPARTMENT_OTHER): Payer: Medicare Other | Admitting: Oncology

## 2015-06-18 ENCOUNTER — Encounter: Payer: Self-pay | Admitting: Oncology

## 2015-06-18 VITALS — BP 153/91 | HR 80 | Temp 97.6°F | Wt 142.0 lb

## 2015-06-18 DIAGNOSIS — R1310 Dysphagia, unspecified: Secondary | ICD-10-CM | POA: Diagnosis not present

## 2015-06-18 DIAGNOSIS — Z79899 Other long term (current) drug therapy: Secondary | ICD-10-CM

## 2015-06-18 DIAGNOSIS — R948 Abnormal results of function studies of other organs and systems: Secondary | ICD-10-CM

## 2015-06-18 DIAGNOSIS — C029 Malignant neoplasm of tongue, unspecified: Secondary | ICD-10-CM

## 2015-06-18 DIAGNOSIS — E860 Dehydration: Secondary | ICD-10-CM | POA: Diagnosis not present

## 2015-06-18 DIAGNOSIS — Z87891 Personal history of nicotine dependence: Secondary | ICD-10-CM | POA: Diagnosis not present

## 2015-06-18 DIAGNOSIS — K219 Gastro-esophageal reflux disease without esophagitis: Secondary | ICD-10-CM | POA: Diagnosis not present

## 2015-06-18 DIAGNOSIS — I10 Essential (primary) hypertension: Secondary | ICD-10-CM | POA: Diagnosis not present

## 2015-06-18 DIAGNOSIS — E780 Pure hypercholesterolemia: Secondary | ICD-10-CM | POA: Diagnosis not present

## 2015-06-18 DIAGNOSIS — Z923 Personal history of irradiation: Secondary | ICD-10-CM | POA: Diagnosis not present

## 2015-06-18 DIAGNOSIS — Z85810 Personal history of malignant neoplasm of tongue: Secondary | ICD-10-CM

## 2015-06-18 NOTE — Progress Notes (Signed)
Patient does not have living will. Former smoker.  Patient here for PET results.  States he has throbbing pain from his hips down. Also complains of sore throat.  Further complains of burning on urination with some odor.

## 2015-06-19 DIAGNOSIS — C01 Malignant neoplasm of base of tongue: Secondary | ICD-10-CM | POA: Diagnosis not present

## 2015-06-19 DIAGNOSIS — R070 Pain in throat: Secondary | ICD-10-CM | POA: Diagnosis not present

## 2015-06-21 ENCOUNTER — Ambulatory Visit
Admission: RE | Admit: 2015-06-21 | Discharge: 2015-06-21 | Disposition: A | Discharge status: Home / Self Care | Payer: Medicare Other | Source: Ambulatory Visit | Attending: Oncology | Admitting: Oncology

## 2015-06-21 DIAGNOSIS — C029 Malignant neoplasm of tongue, unspecified: Secondary | ICD-10-CM | POA: Insufficient documentation

## 2015-06-21 HISTORY — DX: Essential (primary) hypertension: I10

## 2015-06-21 MED ORDER — IOHEXOL 300 MG/ML  SOLN
75.0000 mL | Freq: Once | INTRAMUSCULAR | Status: AC | PRN
  Administered 2015-06-21: 75 mL via INTRAVENOUS

## 2015-06-22 ENCOUNTER — Other Ambulatory Visit: Payer: Medicare Other

## 2015-06-22 ENCOUNTER — Encounter: Payer: Self-pay | Admitting: *Deleted

## 2015-06-22 NOTE — Patient Instructions (Signed)
  Your procedure is scheduled on: 06-26-15 Report to [place] To find out your arrival time please call [phone] between 1PM - 3PM on 06-25-15  Remember: Instructions that are not followed completely may result in serious medical risk, up to and including death, or upon the discretion of your surgeon and anesthesiologist your surgery may need to be rescheduled.    __X__ 1. Do not eat food or drink liquids after midnight. No gum chewing or hard candies.     __X__ 2. No Alcohol for 24 hours before or after surgery.   ____ 3. Bring all medications with you on the day of surgery if instructed.    ____ 4. Notify your doctor if there is any change in your medical condition     (cold, fever, infections).     Do not wear jewelry, make-up, hairpins, clips or nail polish.  Do not wear lotions, powders, or perfumes. You may wear deodorant.  Do not shave 48 hours prior to surgery. Men may shave face and neck.  Do not bring valuables to the hospital.    [facility] is not responsible for any belongings or valuables.               Contacts, dentures or bridgework may not be worn into surgery.  Leave your suitcase in the car. After surgery it may be brought to your room.  For patients admitted to the hospital, discharge time is determined by your  treatment team.   Patients discharged the day of surgery will not be allowed to drive home.   Please read over the following fact sheets that you were given:   _X___ Take these medicines the morning of surgery with A SIP OF WATER:    1. PRILOSEC  2. TAKE AN EXTRA PRILOSEC Monday NIGHT  3.   4.  5.  6.  ____ Fleet Enema (as directed)   ____ Use CHG Soap as directed  ____ Use inhalers on the day of surgery  ____ Stop metformin 2 days prior to surgery    ____ Take 1/2 of usual insulin dose the night before surgery and none on the morning of surgery.   ____ Stop Coumadin/Plavix/aspirin-N/A  ____ Stop  Anti-inflammatories-NO NSAIDS OR ASA PRODUCTS-TYLENOL/OXYCODONE OK   _X___ Stop supplements until after surgery-STOP PROBIOTIC NOW  ____ Bring C-Pap to the hospital.

## 2015-06-23 ENCOUNTER — Encounter: Payer: Self-pay | Admitting: Oncology

## 2015-06-23 NOTE — Progress Notes (Signed)
[place] @ [facility] [phone]  Fax:([phone]) [place] OB: February 04, 1956  MR#: [id number]  FBP#:[id number]  Patient Care Team: Cyndi Bender, PA-C as PCP - General (Physician Assistant)  CHIEF COMPLAINT:  Chief Complaint  Patient presents with  . Follow-up   1.  Has a history of cancer of the tongue in 2006.  Patient underwent resection followed by radiation therapy 2.  Increasing difficulty in swallowing for last 2 or 3 weeks.  Patient had upper endoscopy done in December of 2015. 3.  Significant weight loss 4.  Abnormal PET scan (August of 2016)  VISIT DIAGNOSIS:  Significant weight loss. Difficulty swallowing. History of carcinoma of tongue    Oncology Flowsheet 06/11/2015  dexamethasone (DECADRON) IV 10 mg    INTERVAL HISTORY:  I received a phone call from ENT surgeon to see this 59 year old gentleman who had been seen by me several years ago.  Patient had a history of carcinoma of lung (exact state not known) old chart being reviewed.  At present old records are not available on EMR. According to patient he had been doing fairly good.  He quit smoking in November.  December had upper endoscopy because of difficulty swallowing.  No abnormality detected patient underwent esophageal dilation. Recently he has increasing difficulty in swallowing but that is in right at the level off larynx and beginning of esophagus. Has not lost significant amount of weight.  Soreness in the mouth.  Patient has been treated with antifungal antibiotics without much relief.  Patient has lost approximately 40-50 pounds of weight. Also has increasing cough.  Yellowish expectoration.  Patient had been a chronic smoker and quit smoking in November of 2015 June 18, 2015 Patient is here for ongoing evaluation and treatment consideration.  Since last evaluation patient had a PET scan done.  Patient continues to have problems with pain and difficulty  swallowing.  PET scan has been reviewed independently and shows abnormality on the left side of the base of the tongue extending all the way to epiglottis  REVIEW OF SYSTEMS:   Gen. status: Patient is feeling weak and tired.  Has lost significant weight Poor appetite Increasing difficulty swallowing as mentioned in history of present illness HEENT: As mentioned in history of present illness patient had history of carcinoma of tongue status post radiation therapy recently having increasing soreness in the mouth. Lungs: Increasing cough shortness of breath yellowish expectoration no fever no hemoptysis GI no nausea no vomiting no diarrhea GU no dysuria hematuria skin: No rash neurological system no tingling numbness.. [place] close skeletal system no bony pain  As per HPI. Otherwise, a complete review of systems is negatve.  PAST MEDICAL HISTORY: Carcinoma of tongue Hypertension Hypercholesterolemia Previous substance abuse Gastroesophageal reflux disease  PAST SURGICAL HISTORY: Treated for carcinoma of tongue FAMILY HISTORY There is no significant family history of breast cancer, ovarian cancer, colon cancer    ADVANCED DIRECTIVES:  Patient does not have any living will or healthcare power of attorney.  Information was given .  Available resources had been discussed.  We will follow-up on subsequent appointments regarding this issue  HEALTH MAINTENANCE: Social History  Substance Use Topics  . Smoking status: Former Smoker -- 1.00 packs/day for 0 years    Types: Cigarettes    Quit date: 09/21/2014  . Smokeless tobacco: None  . Alcohol Use: Yes     Comment: beer/wine every day    Quit smoking in November of 2015.  History  of smoking for several years in the past.  No Known Allergies  Current Outpatient Prescriptions  Medication Sig Dispense Refill  . lisinopril (PRINIVIL,ZESTRIL) 10 MG tablet 10 mg once at bedtime    . omeprazole (PRILOSEC) 40 MG capsule 40 mg once in the  morning    . Oxycodone HCl 20 MG TABS 30 mg every 6 (six) hours.    . pravastatin (PRAVACHOL) 40 MG tablet 40 mg once bedtime    . traZODone (DESYREL) 50 MG tablet 50 mg nightly.    . Probiotic Product (PROBIOTIC DAILY) CAPS Take by mouth.     No current facility-administered medications for this visit.    OBJECTIVE: PHYSICAL EXAM: Gen. status: Patient is seen lean and cachectic. HEENT: No soreness in the mouth.  But swelling which is diffuse Lymphatic system: Supraclavicular, cervical, axillary, inguinal lymph nodes are not palpable Lungs: Bilateral rhonchi and occasional crepitation. Cardiac: Tachycardia Examination of the skin revealed no evidence of significant rashes, suspicious appearing nevi or other concerning lesions.. Abdominal exam revealed normal bowel sounds. The abdomen was soft, non-tender, and without masses, organomegaly, or appreciable enlargement of the abdominal aorta.. Psychiatric system: Depression and anxiety  Filed Vitals:   06/18/15 1532  BP: 153/91  Pulse: 80  Temp: 97.6 F (36.4 C)     There is no height on file to calculate BMI.    ECOG FS:1 - Symptomatic but completely ambulatory  LAB RESULTS:  No visits with results within 2 Day(s) from this visit. Latest known visit with results is:  Hospital Outpatient Visit on 06/14/2015  Component Date Value Ref Range Status  . Glucose-Capillary 06/14/2015 71  65 - 99 mg/dL Final       ASSESSMENT:  1.  Carcinoma of tongue in 2006 status post resection and radiation therapy exit staging not known and old records being off pain for review 2.  Difficulty in swallowing 2-3 weeks duration.  Had a normal upper endoscopy in December of 2015 with esophageal dilated Patient history of peptic ulcer disease 3, significant weight loss 4, previous history of chronic drug abuse.  Chronic tobacco abuse  PLAN:   PET scan has been reviewed independently and reviewed with the patient  There is significant abnormality  on the left side of the base of the tongue extending all the way to epiglottis.  Case was discussed in tumor conference as well as I give a phone call to Dr. Tami Ribas.  Dr. Tami Ribas will arrange for endoscopy as well as biopsy Total duration of visit was 45 minutes.  30% or more time was spent in counseling patient and family regarding prognosis and options of treatment and available resources Patient will be followed after biopsies done   No matching staging information was found for the patient.  Forest Gleason, MD   06/23/2015 9:00 AM

## 2015-06-26 ENCOUNTER — Encounter
Admission: RE | Disposition: A | Discharge status: Home / Self Care | Payer: Self-pay | Source: Ambulatory Visit | Attending: Unknown Physician Specialty

## 2015-06-26 ENCOUNTER — Ambulatory Visit
Admission: RE | Admit: 2015-06-26 | Discharge: 2015-06-26 | Disposition: A | Discharge status: Home / Self Care | Payer: Medicare Other | Source: Ambulatory Visit | Attending: Unknown Physician Specialty | Admitting: Unknown Physician Specialty

## 2015-06-26 ENCOUNTER — Encounter: Payer: Self-pay | Admitting: *Deleted

## 2015-06-26 ENCOUNTER — Ambulatory Visit: Payer: Medicare Other | Admitting: Anesthesiology

## 2015-06-26 DIAGNOSIS — K219 Gastro-esophageal reflux disease without esophagitis: Secondary | ICD-10-CM | POA: Diagnosis not present

## 2015-06-26 DIAGNOSIS — C01 Malignant neoplasm of base of tongue: Secondary | ICD-10-CM | POA: Diagnosis not present

## 2015-06-26 DIAGNOSIS — I10 Essential (primary) hypertension: Secondary | ICD-10-CM | POA: Diagnosis not present

## 2015-06-26 DIAGNOSIS — Z87891 Personal history of nicotine dependence: Secondary | ICD-10-CM | POA: Diagnosis not present

## 2015-06-26 DIAGNOSIS — D649 Anemia, unspecified: Secondary | ICD-10-CM | POA: Insufficient documentation

## 2015-06-26 DIAGNOSIS — Z79899 Other long term (current) drug therapy: Secondary | ICD-10-CM | POA: Insufficient documentation

## 2015-06-26 DIAGNOSIS — Z85810 Personal history of malignant neoplasm of tongue: Secondary | ICD-10-CM | POA: Diagnosis not present

## 2015-06-26 HISTORY — DX: Malignant (primary) neoplasm, unspecified: C80.1

## 2015-06-26 HISTORY — DX: Anemia, unspecified: D64.9

## 2015-06-26 HISTORY — DX: Unspecified osteoarthritis, unspecified site: M19.90

## 2015-06-26 HISTORY — PX: DIRECT LARYNGOSCOPY: SHX5326

## 2015-06-26 HISTORY — DX: Chronic or unspecified gastrojejunal ulcer with hemorrhage: K28.4

## 2015-06-26 HISTORY — DX: Gastro-esophageal reflux disease without esophagitis: K21.9

## 2015-06-26 HISTORY — DX: Contact with unspecified machinery, initial encounter: W31.9XXA

## 2015-06-26 HISTORY — DX: Pneumonia, unspecified organism: J18.9

## 2015-06-26 HISTORY — DX: Chronic or unspecified peptic ulcer, site unspecified, with hemorrhage: K27.4

## 2015-06-26 SURGERY — LARYNGOSCOPY, DIRECT
Anesthesia: General | Wound class: Clean Contaminated

## 2015-06-26 MED ORDER — LIDOCAINE HCL (CARDIAC) 20 MG/ML IV SOLN
INTRAVENOUS | Status: DC | PRN
  Administered 2015-06-26: 50 mg via INTRAVENOUS

## 2015-06-26 MED ORDER — SUCCINYLCHOLINE CHLORIDE 20 MG/ML IJ SOLN
INTRAMUSCULAR | Status: DC | PRN
  Administered 2015-06-26: 80 mg via INTRAVENOUS

## 2015-06-26 MED ORDER — OXYCODONE-ACETAMINOPHEN 7.5-325 MG PO TABS
1.0000 | ORAL_TABLET | ORAL | Status: DC | PRN
  Administered 2015-06-26: 1 via ORAL

## 2015-06-26 MED ORDER — LIDOCAINE-EPINEPHRINE 1 %-1:100000 IJ SOLN
INTRAMUSCULAR | Status: AC
  Filled 2015-06-26: qty 1

## 2015-06-26 MED ORDER — PROPOFOL 10 MG/ML IV BOLUS
INTRAVENOUS | Status: DC | PRN
  Administered 2015-06-26: 150 mg via INTRAVENOUS
  Administered 2015-06-26: 50 mg via INTRAVENOUS

## 2015-06-26 MED ORDER — ONDANSETRON HCL 4 MG/2ML IJ SOLN: 4.0000 mg | Freq: Once | INTRAMUSCULAR | Status: DC | PRN

## 2015-06-26 MED ORDER — ONDANSETRON HCL 4 MG/2ML IJ SOLN
INTRAMUSCULAR | Status: DC | PRN
  Administered 2015-06-26: 4 mg via INTRAVENOUS

## 2015-06-26 MED ORDER — LACTATED RINGERS IV SOLN
INTRAVENOUS | Status: DC
  Administered 2015-06-26: 09:00:00 via INTRAVENOUS

## 2015-06-26 MED ORDER — DEXAMETHASONE SODIUM PHOSPHATE 4 MG/ML IJ SOLN
INTRAMUSCULAR | Status: DC | PRN
  Administered 2015-06-26: 10 mg via INTRAVENOUS

## 2015-06-26 MED ORDER — FENTANYL CITRATE (PF) 100 MCG/2ML IJ SOLN: 25.0000 ug | INTRAMUSCULAR | Status: DC | PRN

## 2015-06-26 MED ORDER — PHENYLEPHRINE HCL 10 % OP SOLN
Freq: Once | OPHTHALMIC | Status: AC
  Administered 2015-06-26: 1 mL via TOPICAL
  Filled 2015-06-26: qty 10

## 2015-06-26 MED ORDER — OXYCODONE-ACETAMINOPHEN 7.5-325 MG PO TABS: 1.0000 | ORAL_TABLET | ORAL | Status: DC | PRN

## 2015-06-26 MED ORDER — FENTANYL CITRATE (PF) 100 MCG/2ML IJ SOLN
INTRAMUSCULAR | Status: DC | PRN
  Administered 2015-06-26 (×2): 50 ug via INTRAVENOUS

## 2015-06-26 MED ORDER — OXYCODONE-ACETAMINOPHEN 7.5-325 MG PO TABS
ORAL_TABLET | ORAL | Status: AC
  Filled 2015-06-26: qty 1

## 2015-06-26 MED ORDER — ROCURONIUM BROMIDE 100 MG/10ML IV SOLN
INTRAVENOUS | Status: DC | PRN
  Administered 2015-06-26: 10 mg via INTRAVENOUS

## 2015-06-26 MED ORDER — MIDAZOLAM HCL 2 MG/2ML IJ SOLN
INTRAMUSCULAR | Status: DC | PRN
  Administered 2015-06-26: 2 mg via INTRAVENOUS

## 2015-06-26 SURGICAL SUPPLY — 17 items
CANISTER SUCT 1200ML W/VALVE (MISCELLANEOUS) ×3 IMPLANT
CUP MEDICINE 2OZ PLAST GRAD ST (MISCELLANEOUS) IMPLANT
DRAPE TABLE BACK 80X90 (DRAPES) IMPLANT
DRESSING TELFA 4X3 1S ST N-ADH (GAUZE/BANDAGES/DRESSINGS) ×3 IMPLANT
GLOVE BIO SURGEON STRL SZ7.5 (GLOVE) ×6 IMPLANT
GOWN STRL REUS W/ TWL LRG LVL3 (GOWN DISPOSABLE) ×1 IMPLANT
GOWN STRL REUS W/TWL LRG LVL3 (GOWN DISPOSABLE) ×2
NDL SAFETY 18GX1.5 (NEEDLE) ×3 IMPLANT
NEEDLE HYPO 25GX1X1/2 BEV (NEEDLE) IMPLANT
PACK HEAD/NECK (MISCELLANEOUS) ×3 IMPLANT
PATTIES SURGICAL .5 X.5 (GAUZE/BANDAGES/DRESSINGS) ×3 IMPLANT
SOL ANTI-FOG 6CC FOG-OUT (MISCELLANEOUS) ×1 IMPLANT
SOL FOG-OUT ANTI-FOG 6CC (MISCELLANEOUS) ×2
SPONGE XRAY 4X4 16PLY STRL (MISCELLANEOUS) IMPLANT
SYRINGE 10CC LL (SYRINGE) IMPLANT
TUBING CONNECTING 10 (TUBING) IMPLANT
TUBING CONNECTING 10' (TUBING)

## 2015-06-26 NOTE — Op Note (Signed)
06/26/2015  10:57 AM      Grant Lee  [id number]   Pre-Op Dx: HISTORY OF MALIGNANT NEOPLASM BASE OF TONGUE  Post-op Dx: SAME  Proc: Direct laryngoscopy with tongue base biopsy.   Surg:  Beverly Gust T  Anes:  GOT  EBL:  Less than 20 mL  Comp:  None  Findings:  Firm necrotic mass left tongue base. Middle tongue base right tongue base unremarkable  Procedure: [place] was identified in the holding area and taken to the operating room. He was placed in supine position after general endotracheal anesthesia the table was turned 90. A soft gauze was placed against the upper edentulous maxilla a Dedo laryngoscope was introduced into the airway. Examination of the oropharynx was unremarkable. Examination the right tongue base was unremarkable the vallecula and middle tongue base are clear the left tongue base and vallecula had necrotic debris overlying what appeared to be a firm mass. The suction was used to clean the debris free from this area of the tongue base. Cup forceps and then used to perform biopsies of the left tongue base mass middle tongue base vallecula and right tongue base. Cottonoid pledgets with a phenylephrine lidocaine solution were then placed over the biopsy sites for hemostasis. The larynx was also examined and appeared to be unremarkable. The patient was then returned to anesthesia where he was extubated in the operative room take the pacu in  stable condition.  Dispo:   Excellent condition  Plan:  Discharge to PACU then to home  Specimens: Left tongue base, middle tongue base, right tongue base.  Cultures: None  Grant Lee T  06/26/2015 10:57 AM

## 2015-06-26 NOTE — Anesthesia Procedure Notes (Signed)
Procedure Name: Intubation Date/Time: 06/26/2015 10:39 AM Performed by: Rolla Plate Pre-anesthesia Checklist: Patient identified, Patient being monitored, Timeout performed, Emergency Drugs available and Suction available Patient Re-evaluated:Patient Re-evaluated prior to inductionOxygen Delivery Method: Circle system utilized Preoxygenation: Pre-oxygenation with 100% oxygen Intubation Type: IV induction Ventilation: Mask ventilation without difficulty Laryngoscope Size: Miller and 2 Grade View: Grade I Tube type: MLT Tube size: 6.5 mm Number of attempts: 1 Airway Equipment and Method: Stylet Placement Confirmation: ETT inserted through vocal cords under direct vision,  positive ETCO2 and breath sounds checked- equal and bilateral Secured at: 24 cm Tube secured with: Tape Dental Injury: Teeth and Oropharynx as per pre-operative assessment

## 2015-06-26 NOTE — H&P (Signed)
  H+P  Reviewed and will be scanned in later. No changes noted. 

## 2015-06-26 NOTE — Discharge Instructions (Signed)
AMBULATORY SURGERY  °DISCHARGE INSTRUCTIONS ° ° °1) The drugs that you were given will stay in your system until tomorrow so for the next 24 hours you should not: ° °A) Drive an automobile °B) Make any legal decisions °C) Drink any alcoholic beverage ° ° °2) You may resume regular meals tomorrow.  Today it is better to start with liquids and gradually work up to solid foods. ° °You may eat anything you prefer, but it is better to start with liquids, then soup and crackers, and gradually work up to solid foods. ° ° °3) Please notify your doctor immediately if you have any unusual bleeding, trouble breathing, redness and pain at the surgery site, drainage, fever, or pain not relieved by medication. ° ° ° °4) Additional Instructions: ° ° ° ° ° ° ° °Please contact your physician with any problems or Same Day Surgery at [phone], Monday through Friday 6 am to 4 pm, or [place] at [place] Main number at [phone].AMBULATORY SURGERY  °DISCHARGE INSTRUCTIONS ° ° °5) The drugs that you were given will stay in your system until tomorrow so for the next 24 hours you should not: ° °D) Drive an automobile °E) Make any legal decisions °F) Drink any alcoholic beverage ° ° °6) You may resume regular meals tomorrow.  Today it is better to start with liquids and gradually work up to solid foods. ° °You may eat anything you prefer, but it is better to start with liquids, then soup and crackers, and gradually work up to solid foods. ° ° °7) Please notify your doctor immediately if you have any unusual bleeding, trouble breathing, redness and pain at the surgery site, drainage, fever, or pain not relieved by medication. ° ° ° °8) Additional Instructions: ° ° ° ° ° ° ° °Please contact your physician with any problems or Same Day Surgery at [phone], Monday through Friday 6 am to 4 pm, or [place] at [place] Main number at [phone]. °

## 2015-06-26 NOTE — Anesthesia Postprocedure Evaluation (Signed)
  Anesthesia Post-op Note  Patient: Grant Lee  Procedure(s) Performed: Procedure(s): Laryngoscopy with tongue base biopsy  (N/A)  Anesthesia type:General  Patient location: PACU  Post pain: Pain level controlled  Post assessment: Post-op Vital signs reviewed, Patient's Cardiovascular Status Stable, Respiratory Function Stable, Patent Airway and No signs of Nausea or vomiting  Post vital signs: Reviewed and stable  Last Vitals:  Filed Vitals:   06/26/15 1205  BP: 151/87  Pulse: 68  Temp: 36.8 C  Resp: 16    Level of consciousness: awake, alert  and patient cooperative  Complications: No apparent anesthesia complications

## 2015-06-26 NOTE — Anesthesia Preprocedure Evaluation (Signed)
Anesthesia Evaluation  Patient identified by MRN, date of birth, ID band Patient awake    Reviewed: Allergy & Precautions, NPO status , Patient's Chart, lab work & pertinent test results  History of Anesthesia Complications Negative for: history of anesthetic complications  Airway Mallampati: II  TM Distance: >3 FB Neck ROM: Full    Dental  (+) Edentulous Upper, Edentulous Lower   Pulmonary former smoker (quit x 9 months),          Cardiovascular hypertension, Pt. on medications     Neuro/Psych    GI/Hepatic PUD, GERD-  Medicated,  Endo/Other    Renal/GU      Musculoskeletal   Abdominal   Peds  Hematology  (+) anemia ,   Anesthesia Other Findings   Reproductive/Obstetrics                             Anesthesia Physical Anesthesia Plan  ASA: III  Anesthesia Plan: General   Post-op Pain Management:    Induction: Intravenous  Airway Management Planned: Oral ETT  Additional Equipment:   Intra-op Plan:   Post-operative Plan:   Informed Consent: I have reviewed the patients History and Physical, chart, labs and discussed the procedure including the risks, benefits and alternatives for the proposed anesthesia with the patient or authorized representative who has indicated his/her understanding and acceptance.     Plan Discussed with:   Anesthesia Plan Comments:         Anesthesia Quick Evaluation

## 2015-06-26 NOTE — Transfer of Care (Signed)
Immediate Anesthesia Transfer of Care Note  Patient: Grant Lee [place]  Procedure(s) Performed: Procedure(s): Laryngoscopy with tongue base biopsy  (N/A)  Patient Location: PACU  Anesthesia Type:General  Level of Consciousness: awake  Airway & Oxygen Therapy: Patient Spontanous Breathing and Patient connected to face mask oxygen  Post-op Assessment: Report given to RN  Post vital signs: Reviewed  Last Vitals:  Filed Vitals:   06/26/15 1108  BP: 133/92  Pulse:   Temp:   Resp: 21    Complications: No apparent anesthesia complications

## 2015-06-26 NOTE — Progress Notes (Signed)
Pain  Level 9    States wants to go home

## 2015-06-28 LAB — SURGICAL PATHOLOGY

## 2015-07-03 ENCOUNTER — Inpatient Hospital Stay: Payer: Medicare Other

## 2015-07-03 ENCOUNTER — Inpatient Hospital Stay
Admission: EM | Admit: 2015-07-03 | Discharge: 2015-07-07 | DRG: 640 | Disposition: A | Discharge status: Home / Self Care | Payer: Medicare Other | Attending: Internal Medicine | Admitting: Internal Medicine

## 2015-07-03 ENCOUNTER — Encounter: Payer: Self-pay | Admitting: Emergency Medicine

## 2015-07-03 DIAGNOSIS — Z79891 Long term (current) use of opiate analgesic: Secondary | ICD-10-CM

## 2015-07-03 DIAGNOSIS — K219 Gastro-esophageal reflux disease without esophagitis: Secondary | ICD-10-CM | POA: Diagnosis not present

## 2015-07-03 DIAGNOSIS — K1379 Other lesions of oral mucosa: Secondary | ICD-10-CM | POA: Diagnosis not present

## 2015-07-03 DIAGNOSIS — Z96641 Presence of right artificial hip joint: Secondary | ICD-10-CM | POA: Diagnosis not present

## 2015-07-03 DIAGNOSIS — Z8701 Personal history of pneumonia (recurrent): Secondary | ICD-10-CM | POA: Diagnosis not present

## 2015-07-03 DIAGNOSIS — Z87891 Personal history of nicotine dependence: Secondary | ICD-10-CM | POA: Diagnosis not present

## 2015-07-03 DIAGNOSIS — K259 Gastric ulcer, unspecified as acute or chronic, without hemorrhage or perforation: Secondary | ICD-10-CM | POA: Diagnosis not present

## 2015-07-03 DIAGNOSIS — Y842 Radiological procedure and radiotherapy as the cause of abnormal reaction of the patient, or of later complication, without mention of misadventure at the time of the procedure: Secondary | ICD-10-CM | POA: Diagnosis present

## 2015-07-03 DIAGNOSIS — Z85810 Personal history of malignant neoplasm of tongue: Secondary | ICD-10-CM

## 2015-07-03 DIAGNOSIS — R05 Cough: Secondary | ICD-10-CM | POA: Diagnosis not present

## 2015-07-03 DIAGNOSIS — C3490 Malignant neoplasm of unspecified part of unspecified bronchus or lung: Secondary | ICD-10-CM | POA: Diagnosis present

## 2015-07-03 DIAGNOSIS — Z8711 Personal history of peptic ulcer disease: Secondary | ICD-10-CM | POA: Diagnosis not present

## 2015-07-03 DIAGNOSIS — R948 Abnormal results of function studies of other organs and systems: Secondary | ICD-10-CM | POA: Diagnosis not present

## 2015-07-03 DIAGNOSIS — K269 Duodenal ulcer, unspecified as acute or chronic, without hemorrhage or perforation: Secondary | ICD-10-CM | POA: Diagnosis not present

## 2015-07-03 DIAGNOSIS — G894 Chronic pain syndrome: Secondary | ICD-10-CM | POA: Diagnosis present

## 2015-07-03 DIAGNOSIS — M1990 Unspecified osteoarthritis, unspecified site: Secondary | ICD-10-CM | POA: Diagnosis present

## 2015-07-03 DIAGNOSIS — K2970 Gastritis, unspecified, without bleeding: Secondary | ICD-10-CM | POA: Diagnosis present

## 2015-07-03 DIAGNOSIS — C140 Malignant neoplasm of pharynx, unspecified: Secondary | ICD-10-CM | POA: Diagnosis not present

## 2015-07-03 DIAGNOSIS — J690 Pneumonitis due to inhalation of food and vomit: Secondary | ICD-10-CM | POA: Diagnosis not present

## 2015-07-03 DIAGNOSIS — R4702 Dysphasia: Secondary | ICD-10-CM | POA: Diagnosis not present

## 2015-07-03 DIAGNOSIS — I10 Essential (primary) hypertension: Secondary | ICD-10-CM | POA: Diagnosis not present

## 2015-07-03 DIAGNOSIS — Z681 Body mass index (BMI) 19 or less, adult: Secondary | ICD-10-CM

## 2015-07-03 DIAGNOSIS — Z923 Personal history of irradiation: Secondary | ICD-10-CM | POA: Diagnosis not present

## 2015-07-03 DIAGNOSIS — K149 Disease of tongue, unspecified: Secondary | ICD-10-CM | POA: Diagnosis not present

## 2015-07-03 DIAGNOSIS — D649 Anemia, unspecified: Secondary | ICD-10-CM | POA: Diagnosis present

## 2015-07-03 DIAGNOSIS — C029 Malignant neoplasm of tongue, unspecified: Secondary | ICD-10-CM | POA: Diagnosis not present

## 2015-07-03 DIAGNOSIS — Z79899 Other long term (current) drug therapy: Secondary | ICD-10-CM | POA: Diagnosis not present

## 2015-07-03 DIAGNOSIS — R1310 Dysphagia, unspecified: Secondary | ICD-10-CM | POA: Diagnosis present

## 2015-07-03 DIAGNOSIS — R112 Nausea with vomiting, unspecified: Secondary | ICD-10-CM | POA: Diagnosis not present

## 2015-07-03 DIAGNOSIS — R634 Abnormal weight loss: Secondary | ICD-10-CM | POA: Diagnosis not present

## 2015-07-03 DIAGNOSIS — C060 Malignant neoplasm of cheek mucosa: Secondary | ICD-10-CM | POA: Diagnosis not present

## 2015-07-03 DIAGNOSIS — E43 Unspecified severe protein-calorie malnutrition: Principal | ICD-10-CM | POA: Diagnosis present

## 2015-07-03 DIAGNOSIS — E46 Unspecified protein-calorie malnutrition: Secondary | ICD-10-CM | POA: Diagnosis not present

## 2015-07-03 DIAGNOSIS — E860 Dehydration: Secondary | ICD-10-CM | POA: Diagnosis present

## 2015-07-03 DIAGNOSIS — R059 Cough, unspecified: Secondary | ICD-10-CM

## 2015-07-03 DIAGNOSIS — Z431 Encounter for attention to gastrostomy: Secondary | ICD-10-CM | POA: Diagnosis not present

## 2015-07-03 DIAGNOSIS — R633 Feeding difficulties: Secondary | ICD-10-CM | POA: Diagnosis not present

## 2015-07-03 DIAGNOSIS — C01 Malignant neoplasm of base of tongue: Secondary | ICD-10-CM | POA: Diagnosis not present

## 2015-07-03 DIAGNOSIS — K2950 Unspecified chronic gastritis without bleeding: Secondary | ICD-10-CM | POA: Diagnosis not present

## 2015-07-03 LAB — CBC WITH DIFFERENTIAL/PLATELET
BASOS PCT: 0 %
Basophils Absolute: 0 10*3/uL (ref 0–0.1)
EOS ABS: 0 10*3/uL (ref 0–0.7)
Eosinophils Relative: 0 %
HCT: 35.8 % — ABNORMAL LOW (ref 40.0–52.0)
Hemoglobin: 11.9 g/dL — ABNORMAL LOW (ref 13.0–18.0)
Lymphocytes Relative: 13 %
Lymphs Abs: 1.2 10*3/uL (ref 1.0–3.6)
MCH: 33.7 pg (ref 26.0–34.0)
MCHC: 33.3 g/dL (ref 32.0–36.0)
MCV: 101 fL — ABNORMAL HIGH (ref 80.0–100.0)
MONO ABS: 0.8 10*3/uL (ref 0.2–1.0)
MONOS PCT: 8 %
Neutro Abs: 7.6 10*3/uL — ABNORMAL HIGH (ref 1.4–6.5)
Neutrophils Relative %: 79 %
Platelets: 327 10*3/uL (ref 150–440)
RBC: 3.54 MIL/uL — ABNORMAL LOW (ref 4.40–5.90)
RDW: 14.1 % (ref 11.5–14.5)
WBC: 9.7 10*3/uL (ref 3.8–10.6)

## 2015-07-03 LAB — COMPREHENSIVE METABOLIC PANEL
ALBUMIN: 2.6 g/dL — AB (ref 3.5–5.0)
ALK PHOS: 81 U/L (ref 38–126)
ALT: 8 U/L — AB (ref 17–63)
AST: 23 U/L (ref 15–41)
Anion gap: 7 (ref 5–15)
BUN: <5 mg/dL — ABNORMAL LOW (ref 6–20)
CALCIUM: 8.3 mg/dL — AB (ref 8.9–10.3)
CO2: 27 mmol/L (ref 22–32)
CREATININE: 0.73 mg/dL (ref 0.61–1.24)
Chloride: 102 mmol/L (ref 101–111)
GFR calc Af Amer: >60 mL/min (ref >60)
GFR calc non Af Amer: >60 mL/min (ref >60)
GLUCOSE: 82 mg/dL (ref 65–99)
Potassium: 4 mmol/L (ref 3.5–5.1)
SODIUM: 136 mmol/L (ref 135–145)
Total Bilirubin: 0.1 mg/dL — ABNORMAL LOW (ref 0.3–1.2)
Total Protein: 6 g/dL — ABNORMAL LOW (ref 6.5–8.1)

## 2015-07-03 MED ORDER — CETYLPYRIDINIUM CHLORIDE 0.05 % MT LIQD
7.0000 mL | Freq: Two times a day (BID) | OROMUCOSAL | Status: DC
  Administered 2015-07-03 – 2015-07-05 (×3): 7 mL via OROMUCOSAL

## 2015-07-03 MED ORDER — DIPHENHYDRAMINE HCL 50 MG/ML IJ SOLN
25.0000 mg | Freq: Once | INTRAMUSCULAR | Status: AC
  Administered 2015-07-03: 25 mg via INTRAVENOUS
  Filled 2015-07-03: qty 1

## 2015-07-03 MED ORDER — HYDROMORPHONE HCL 1 MG/ML IJ SOLN
1.0000 mg | INTRAMUSCULAR | Status: DC | PRN
  Administered 2015-07-03 – 2015-07-04 (×9): 1 mg via INTRAVENOUS
  Filled 2015-07-03 (×9): qty 1

## 2015-07-03 MED ORDER — PIPERACILLIN-TAZOBACTAM 3.375 G IVPB
3.3750 g | Freq: Three times a day (TID) | INTRAVENOUS | Status: DC
  Administered 2015-07-03 – 2015-07-04 (×3): 3.375 g via INTRAVENOUS
  Filled 2015-07-03 (×6): qty 50

## 2015-07-03 MED ORDER — MORPHINE SULFATE (PF) 2 MG/ML IV SOLN: 2.0000 mg | INTRAVENOUS | Status: DC | PRN

## 2015-07-03 MED ORDER — ENOXAPARIN SODIUM 40 MG/0.4ML ~~LOC~~ SOLN
40.0000 mg | SUBCUTANEOUS | Status: DC
  Administered 2015-07-03 – 2015-07-05 (×3): 40 mg via SUBCUTANEOUS
  Filled 2015-07-03 (×4): qty 0.4

## 2015-07-03 MED ORDER — LIDOCAINE VISCOUS 2 % MT SOLN: 15.0000 mL | OROMUCOSAL | Status: DC | PRN

## 2015-07-03 MED ORDER — SENNA 8.6 MG PO TABS: 1.0000 | ORAL_TABLET | Freq: Two times a day (BID) | ORAL | Status: DC

## 2015-07-03 MED ORDER — DOCUSATE SODIUM 100 MG PO CAPS: 100.0000 mg | ORAL_CAPSULE | Freq: Two times a day (BID) | ORAL | Status: DC

## 2015-07-03 MED ORDER — LISINOPRIL 10 MG PO TABS: 10.0000 mg | ORAL_TABLET | Freq: Every day | ORAL | Status: DC

## 2015-07-03 MED ORDER — IPRATROPIUM-ALBUTEROL 0.5-2.5 (3) MG/3ML IN SOLN
3.0000 mL | RESPIRATORY_TRACT | Status: DC
  Administered 2015-07-03 (×2): 3 mL via RESPIRATORY_TRACT
  Filled 2015-07-03 (×2): qty 3

## 2015-07-03 MED ORDER — OXYCODONE-ACETAMINOPHEN 7.5-325 MG PO TABS: 1.0000 | ORAL_TABLET | ORAL | Status: DC | PRN

## 2015-07-03 MED ORDER — DEXTROSE-NACL 5-0.45 % IV SOLN
INTRAVENOUS | Status: DC
  Administered 2015-07-03 – 2015-07-07 (×11): via INTRAVENOUS

## 2015-07-03 MED ORDER — PRAVASTATIN SODIUM 20 MG PO TABS: 40.0000 mg | ORAL_TABLET | Freq: Every day | ORAL | Status: DC

## 2015-07-03 MED ORDER — TRAZODONE HCL 50 MG PO TABS: 50.0000 mg | ORAL_TABLET | Freq: Every day | ORAL | Status: DC

## 2015-07-03 MED ORDER — HYDROMORPHONE HCL 1 MG/ML IJ SOLN
1.0000 mg | Freq: Once | INTRAMUSCULAR | Status: AC
  Administered 2015-07-03: 1 mg via INTRAVENOUS
  Filled 2015-07-03: qty 1

## 2015-07-03 MED ORDER — BUDESONIDE-FORMOTEROL FUMARATE 160-4.5 MCG/ACT IN AERO
2.0000 | INHALATION_SPRAY | Freq: Two times a day (BID) | RESPIRATORY_TRACT | Status: DC
  Administered 2015-07-03 – 2015-07-06 (×7): 2 via RESPIRATORY_TRACT
  Filled 2015-07-03 (×2): qty 6

## 2015-07-03 MED ORDER — HYDROCOD POLST-CPM POLST ER 10-8 MG/5ML PO SUER: 5.0000 mL | Freq: Two times a day (BID) | ORAL | Status: DC | PRN

## 2015-07-03 MED ORDER — SODIUM CHLORIDE 0.9 % IV BOLUS (SEPSIS)
1000.0000 mL | Freq: Once | INTRAVENOUS | Status: AC
  Administered 2015-07-03: 1000 mL via INTRAVENOUS

## 2015-07-03 MED ORDER — PANTOPRAZOLE SODIUM 40 MG PO TBEC: 40.0000 mg | DELAYED_RELEASE_TABLET | Freq: Every day | ORAL | Status: DC

## 2015-07-03 MED ORDER — GUAIFENESIN ER 600 MG PO TB12
600.0000 mg | ORAL_TABLET | Freq: Two times a day (BID) | ORAL | Status: DC
  Filled 2015-07-03 (×2): qty 1

## 2015-07-03 MED ORDER — ONDANSETRON HCL 4 MG/2ML IJ SOLN
4.0000 mg | Freq: Once | INTRAMUSCULAR | Status: AC
  Administered 2015-07-03: 4 mg via INTRAVENOUS
  Filled 2015-07-03: qty 2

## 2015-07-03 MED ORDER — ACETAMINOPHEN 10 MG/ML IV SOLN
1000.0000 mg | Freq: Once | INTRAVENOUS | Status: AC
  Administered 2015-07-03: 1000 mg via INTRAVENOUS
  Filled 2015-07-03: qty 100

## 2015-07-03 MED ORDER — MORPHINE SULFATE (PF) 2 MG/ML IV SOLN
2.0000 mg | INTRAVENOUS | Status: DC | PRN
  Administered 2015-07-03: 16:00:00 2 mg via INTRAVENOUS
  Filled 2015-07-03: qty 1

## 2015-07-03 MED ORDER — RISAQUAD PO CAPS: 1.0000 | ORAL_CAPSULE | Freq: Every day | ORAL | Status: DC

## 2015-07-03 MED ORDER — CHLORHEXIDINE GLUCONATE 0.12 % MT SOLN
15.0000 mL | Freq: Two times a day (BID) | OROMUCOSAL | Status: DC
  Administered 2015-07-03 – 2015-07-06 (×4): 15 mL via OROMUCOSAL
  Filled 2015-07-03 (×7): qty 15

## 2015-07-03 NOTE — Plan of Care (Signed)
Problem: Discharge Progression Outcomes Goal: Discharge plan in place and appropriate Individualization of Care Pt prefers to be called Grant Lee Pt with Hx of throat cancer, recent weight loss of more than 40 pounds in 2 months, unable to swollow without pain,  HIGH fall precautions r/t mulitple falls from weakness at home    Goal: Other Discharge Outcomes/Goals Plan of Care Progress to Goal:  Pt admitted to room 125, oriented to room, pt with c/o pain and morphine given per orders, pt failed bedside swallow test, made NPO per MD.

## 2015-07-03 NOTE — ED Provider Notes (Signed)
Time Seen: Approximately 10 AM  I have reviewed the triage notes  Chief Complaint: Nausea   History of Present Illness: Grant Lee is a 59 y.o. male who was referred here by his ENT physician for further evaluation by the hospitalist team and also by the patient's oncologist. 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 Patient has had decreased food and fluid intake and or some suggestion that he may need to chemotherapy and radiation therapy. He reports some nausea but no persistent vomiting or diarrhea. He denies any changes as for this or trouble with mechanical swallowing. He is able tolerate his own secretions. He's lost approximately 35 pounds in the last 4 months.   Past Medical History  Diagnosis Date  . Hypertension   . Pneumonia     2004  . GERD (gastroesophageal reflux disease)   . Arthritis   . Cancer   . Anemia     blood transfusion 2012  . Bleeding ulcer   . Machinery accident     LEG INJURY    Patient Active Problem List   Diagnosis Date Noted  . Malnutrition 07/03/2015  . Aspiration pneumonitis 07/03/2015  . DU (duodenal ulcer) 06/11/2015  . Tongue cancer 06/11/2015  . Abnormal loss of weight 10/09/2014  . Can't get food down 10/09/2014    Past Surgical History  Procedure Laterality Date  . Leg surgery    . Joint replacement      right hip  . Tongue biopsy    . Tongue surgery      multiple surgeries  . Direct laryngoscopy N/A 06/26/2015    Procedure: Laryngoscopy with tongue base biopsy ;  Surgeon: Beverly Gust, MD;  Location: [facility] ORS;  Service: ENT;  Laterality: N/A;    Past Surgical History  Procedure Laterality Date  . Leg surgery    . Joint replacement      right hip  . Tongue biopsy    . Tongue surgery      multiple surgeries  . Direct laryngoscopy N/A 06/26/2015    Procedure: Laryngoscopy with tongue base biopsy ;  Surgeon: Beverly Gust, MD;  Location: [facility] ORS;  Service: ENT;   Laterality: N/A;    No current outpatient prescriptions on file.  Allergies:  Review of patient's allergies indicates no known allergies.  Family History: History reviewed. No pertinent family history.  Social History: Social History  Substance Use Topics  . Smoking status: Former Smoker -- 1.00 packs/day for 0 years    Types: Cigarettes    Quit date: 09/21/2014  . Smokeless tobacco: None  . Alcohol Use: Yes     Comment: beer/wine every day     Review of Systems:   10 point review of systems was performed and was otherwise negative:  Constitutional: No fever Eyes: No visual disturbances ENT: No sore throat, ear pain, dry mouth Cardiac: No chest pain Respiratory: No shortness of breath, wheezing, or stridor Abdomen: No abdominal pain, no vomiting, No diarrhea Endocrine: Weight loss mentioned in H&P, No night sweats Extremities: No peripheral edema, cyanosis Skin: No rashes, easy bruising Neurologic: No focal weakness, trouble with speech or swollowing Urologic: No dysuria, Hematuria, or urinary frequency   Physical Exam:  ED Triage Vitals  Enc Vitals Group     BP 07/03/15 0947 150/98 mmHg     Pulse Rate 07/03/15 0947 79     Resp 07/03/15 0947 18     Temp 07/03/15 0947 98.1 F (  36.7 C)     Temp Source 07/03/15 0947 Oral     SpO2 07/03/15 0953 100 %     Weight 07/03/15 0953 143 lb (64.864 kg)     Height 07/03/15 0953 '6\' 2"'$  (1.88 m)     Head Cir --      Peak Flow --      Pain Score 07/03/15 0954 10     Pain Loc --      Pain Edu? --      Excl. in [place]? --     General: Awake , Alert , and Oriented times 3; GCS 15 Head: Normal cephalic , atraumatic Eyes: Pupils equal , round, reactive to light Nose/Throat: No nasal drainage, patent upper airway without erythema or exudate. Induration especially the left side of his tongue extending posteriorly. No soft palate abnormalities. Patent upper airway Neck: Supple, Full range of motion, No anterior adenopathy or  palpable thyroid masses. No stridor Lungs: Clear to ascultation without wheezes , rhonchi, or rales Heart: Regular rate, regular rhythm without murmurs , gallops , or rubs Abdomen: Soft, non tender without rebound, guarding , or rigidity; bowel sounds positive and symmetric in all 4 quadrants. No organomegaly .        Extremities: 2 plus symmetric pulses. No edema, clubbing or cyanosis Neurologic: normal ambulation, Motor symmetric without deficits, sensory intact Skin: warm, dry, no rashes   Labs:   All laboratory work was reviewed including any pertinent negatives or positives listed below:  Labs Reviewed  COMPREHENSIVE METABOLIC PANEL - Abnormal; Notable for the following:    BUN <5 (*)    Calcium 8.3 (*)    Total Protein 6.0 (*)    Albumin 2.6 (*)    ALT 8 (*)    Total Bilirubin 0.1 (*)    All other components within normal limits  CBC WITH DIFFERENTIAL/PLATELET - Abnormal; Notable for the following:    RBC 3.54 (*)    Hemoglobin 11.9 (*)    HCT 35.8 (*)    MCV 101.0 (*)    Neutro Abs 7.6 (*)    All other components within normal limits  CULTURE, EXPECTORATED SPUTUM-ASSESSMENT  OCCULT BLOOD X 1 CARD TO LAB, STOOL   Laboratory work was reviewed which showed low protein.  Procedures: None   Critical Care: None  ED Course: Patient had IV inserted and was given IV fluids along with anti-medic and pain medication. Patient had consultation with the hospitalist team and otherwise appears to be hemodynamically stable and has a stable airway. 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

## 2015-07-03 NOTE — ED Notes (Signed)
Pt states he has lost 35 lbs in the past 4 months.  His CA doctor told him to come in for admission to get gtube placed.

## 2015-07-03 NOTE — Consult Note (Signed)
Subjective: Patient seen for possible PEG placement, malnutrition, in the setting of recently diagnosed recurrent head and neck cancer. Patient seen and examined please see full GI consult by Ms Jacqulyn Liner.  Objective: Vital signs in last 24 hours: Temp:  [98 F (36.7 C)-98.4 F (36.9 C)] 98.4 F (36.9 C) (08/23 7341) Pulse Rate:  [66-90] 73 (08/23 2058) Resp:  [18-19] 19 (08/23 1449) BP: (128-159)/(76-108) 159/90 mmHg (08/23 2058) SpO2:  [89 %-100 %] 100 % (08/23 2058) Weight:  [64.864 kg (143 lb)] 64.864 kg (143 lb) (08/23 0953) Blood pressure 159/90, pulse 73, temperature 98.4 F (36.9 C), temperature source Oral, resp. rate 19, height '6\' 2"'$  (1.88 m), weight 64.864 kg (143 lb), SpO2 100 %.   Intake/Output from previous day:    Intake/Output this shift:     General appearance:  Thin, no acute distress Resp:  Clear to auscultation Cardio:  Regular rate and rhythm GI:  Soft nontender nondistended bowel sounds positive normoactive Extremities:  No clubbing cyanosis or edema   Lab Results: Results for orders placed or performed during the hospital encounter of 07/03/15 (from the past 24 hour(s))  Comprehensive metabolic panel     Status: Abnormal   Collection Time: 07/03/15 11:04 AM  Result Value Ref Range   Sodium 136 135 - 145 mmol/L   Potassium 4.0 3.5 - 5.1 mmol/L   Chloride 102 101 - 111 mmol/L   CO2 27 22 - 32 mmol/L   Glucose, Bld 82 65 - 99 mg/dL   BUN <5 (L) 6 - 20 mg/dL   Creatinine, Ser 0.73 0.61 - 1.24 mg/dL   Calcium 8.3 (L) 8.9 - 10.3 mg/dL   Total Protein 6.0 (L) 6.5 - 8.1 g/dL   Albumin 2.6 (L) 3.5 - 5.0 g/dL   AST 23 15 - 41 U/L   ALT 8 (L) 17 - 63 U/L   Alkaline Phosphatase 81 38 - 126 U/L   Total Bilirubin 0.1 (L) 0.3 - 1.2 mg/dL   GFR calc non Af Amer >60 >60 mL/min   GFR calc Af Amer >60 >60 mL/min   Anion gap 7 5 - 15  CBC with Differential/Platelet     Status: Abnormal   Collection Time: 07/03/15 11:04 AM  Result Value Ref Range   WBC 9.7 3.8 -  10.6 K/uL   RBC 3.54 (L) 4.40 - 5.90 MIL/uL   Hemoglobin 11.9 (L) 13.0 - 18.0 g/dL   HCT 35.8 (L) 40.0 - 52.0 %   MCV 101.0 (H) 80.0 - 100.0 fL   MCH 33.7 26.0 - 34.0 pg   MCHC 33.3 32.0 - 36.0 g/dL   RDW 14.1 11.5 - 14.5 %   Platelets 327 150 - 440 K/uL   Neutrophils Relative % 79 %   Neutro Abs 7.6 (H) 1.4 - 6.5 K/uL   Lymphocytes Relative 13 %   Lymphs Abs 1.2 1.0 - 3.6 K/uL   Monocytes Relative 8 %   Monocytes Absolute 0.8 0.2 - 1.0 K/uL   Eosinophils Relative 0 %   Eosinophils Absolute 0.0 0 - 0.7 K/uL   Basophils Relative 0 %   Basophils Absolute 0.0 0 - 0.1 K/uL      Recent Labs  07/03/15 1104  WBC 9.7  HGB 11.9*  HCT 35.8*  PLT 327   BMET  Recent Labs  07/03/15 1104  NA 136  K 4.0  CL 102  CO2 27  GLUCOSE 82  BUN <5*  CREATININE 0.73  CALCIUM 8.3*   LFT  Recent Labs  07/03/15 1104  PROT 6.0*  ALBUMIN 2.6*  AST 23  ALT 8*  ALKPHOS 81  BILITOT 0.1*   PT/INR No results for input(s): LABPROT, INR in the last 72 hours. Hepatitis Panel No results for input(s): HEPBSAG, HCVAB, HEPAIGM, HEPBIGM in the last 72 hours. C-Diff No results for input(s): CDIFFTOX in the last 72 hours. No results for input(s): CDIFFPCR in the last 72 hours.   Studies/Results: Dg Chest 2 View  07/03/2015   CLINICAL DATA:  Cough, history of tongue cancer  EXAM: CHEST  2 VIEW  COMPARISON:  01/24/2015  FINDINGS: Cardiomediastinal silhouette is stable. Hyperinflation again noted. Acute infiltrate or pulmonary edema. Stable chronic interstitial prominence. Old left rib fracture again noted.  IMPRESSION: No active disease. Hyperinflation again noted. Stable chronic interstitial prominence.   Electronically Signed   By: Lahoma Crocker M.D.   On: 07/03/2015 14:13    Scheduled Inpatient Medications:   . antiseptic oral rinse  7 mL Mouth Rinse q12n4p  . budesonide-formoterol  2 puff Inhalation BID  . chlorhexidine  15 mL Mouth Rinse BID  . enoxaparin (LOVENOX) injection  40 mg  Subcutaneous Q24H  . ipratropium-albuterol  3 mL Nebulization Q4H  . piperacillin-tazobactam (ZOSYN)  IV  3.375 g Intravenous Q8H    Continuous Inpatient Infusions:   . dextrose 5 % and 0.45% NaCl 125 mL/hr at 07/03/15 1633    PRN Inpatient Medications:  HYDROmorphone (DILAUDID) injection  Miscellaneous:   Assessment:  1. Weight loss, malnutrition,  dysphagia in the setting of recurrent tongue cancer. Agree with alternative to peroral  Feedings. I will need to discuss with Dr Tami Ribas.  Placement of peg endoscopically involves pulling a "bumper" attached to the feeding tube down the esophagus after site location with EGD.  Agree with speech evaluation to determine extent of disability.  I will plan for about Thursday depending on the above.    Plan:  As noted above.   Lollie Sails MD 07/03/2015, 9:31 PM

## 2015-07-03 NOTE — Progress Notes (Signed)
Order for bedside swollowing study performed, pt unable to tolerate po water without coughing and painful swollowing. Dr. Ether Griffins notified, pt made NPO Donnita Falls, RN 07/03/2015 4:02 PM.

## 2015-07-03 NOTE — Progress Notes (Signed)
Patient requests sleep medication, NPO. MD notified, to put in orders

## 2015-07-03 NOTE — ED Notes (Addendum)
Pt sent in by PCP/oncologist to check in for admission for placement of feeding tube and port. Pt reports pain "12/10" in his throat (location of cancer). He states that he takes percocet for his pain but is out of it. He also reports constant nausea, but denies vomiting or diarrhea. Pt is alert & oriented with NAD.

## 2015-07-03 NOTE — Progress Notes (Signed)
[facility] NAME: Grant Lee    MR#:  [id number]  DATE OF BIRTH:  1956/03/09  DATE OF ADMISSION:  07/03/2015  PRIMARY CARE PHYSICIAN: Fae Pippin   REQUESTING/REFERRING PHYSICIAN:   CHIEF COMPLAINT:   Chief Complaint  Patient presents with  . Nausea    HISTORY OF PRESENT ILLNESS: Grant Lee  is a 59 y.o. male with a known history of right tongue cancer status post operation in 2006, who presents to the hospital with complaints of difficulty swallowing, pain in her throat area on the left and significant weight loss. Upon intubation, he was doing well up until approximately 8 weeks or so ago when he started having difficulty swallowing. He lost approximately 35-40 pounds over the past 8 weeks. He feels dehydrated and has significant pain in the throat area on the left. He cannot swallow well. Aggressive pain. He underwent direct laryngoscopy was tongue base biopsy on 06/26/2015 by Dr. Tami Ribas, which revealed invasive squamous cell carcinoma. Patient underwent PET scanning on 06/14/2015 which revealed left side of base of tongue hypermetabolic focus extending to epiglottis. Patient now presents with hopes that he will undergo radiation or chemotherapy for his lung cancer and the PEG tube is going to be placed during this admission. The patient is complaining of cough and yellow to green sputum production for the past few weeks now. PET scan done a few weeks ago reveals aspiration pneumonitis.   PAST MEDICAL HISTORY:   Past Medical History  Diagnosis Date  . Hypertension   . Pneumonia     2004  . GERD (gastroesophageal reflux disease)   . Arthritis   . Cancer   . Anemia     blood transfusion 2012  . Bleeding ulcer   . Machinery accident     LEG INJURY    PAST SURGICAL HISTORY:  Past Surgical History  Procedure Laterality Date  . Leg surgery    . Joint replacement      right hip  . Tongue biopsy     . Tongue surgery      multiple surgeries  . Direct laryngoscopy N/A 06/26/2015    Procedure: Laryngoscopy with tongue base biopsy ;  Surgeon: Beverly Gust, MD;  Location: [facility] ORS;  Service: ENT;  Laterality: N/A;    SOCIAL HISTORY:  Social History  Substance Use Topics  . Smoking status: Former Smoker -- 1.00 packs/day for 0 years    Types: Cigarettes    Quit date: 09/21/2014  . Smokeless tobacco: Not on file  . Alcohol Use: Yes     Comment: beer/wine every day    FAMILY HISTORY: No significant family history of breast cancer, ovarian cancer or colon cancer  DRUG ALLERGIES: No Known Allergies  Review of Systems  Constitutional: Positive for fever, chills, weight loss and malaise/fatigue.  HENT: Positive for sore throat. Negative for congestion.   Eyes: Positive for blurred vision. Negative for double vision.  Respiratory: Positive for cough, sputum production and wheezing. Negative for shortness of breath.   Cardiovascular: Negative for chest pain, palpitations, orthopnea, leg swelling and PND.  Gastrointestinal: Positive for nausea. Negative for vomiting, abdominal pain, diarrhea, constipation, blood in stool and melena.  Genitourinary: Negative for dysuria, urgency, frequency and hematuria.  Musculoskeletal: Positive for neck pain. Negative for falls.  Skin: Negative for rash.  Neurological: Positive for dizziness and weakness.  Psychiatric/Behavioral: Negative for depression and memory loss. The patient is not nervous/anxious.  MEDICATIONS AT HOME:  Prior to Admission medications   Medication Sig Start Date End Date Taking? Authorizing Provider  lisinopril (PRINIVIL,ZESTRIL) 10 MG tablet 10 mg once at bedtime 08/29/14  Yes Historical Provider, MD  omeprazole (PRILOSEC) 40 MG capsule 40 mg once in the morning 09/29/14  Yes Historical Provider, MD  Oxycodone HCl 20 MG TABS 30 mg every 6 (six) hours. 09/12/14  Yes Historical Provider, MD  oxyCODONE-acetaminophen  (PERCOCET) 7.5-325 MG per tablet Take 1 tablet by mouth every 4 (four) hours as needed for severe pain. 06/26/15  Yes Beverly Gust, MD  pravastatin (PRAVACHOL) 40 MG tablet 40 mg once bedtime 08/28/14  Yes Historical Provider, MD  Probiotic Product (PROBIOTIC DAILY) CAPS Take 1 capsule by mouth daily.    Yes Historical Provider, MD  traZODone (DESYREL) 50 MG tablet 50 mg nightly. 09/12/14  Yes Historical Provider, MD      PHYSICAL EXAMINATION:   VITAL SIGNS: Blood pressure 145/91, pulse 72, temperature 98.1 F (36.7 C), temperature source Oral, resp. rate 18, height '6\' 2"'$  (1.88 m), weight 64.864 kg (143 lb), SpO2 100 %.  GENERAL:  59 y.o.-year-old patient lying in the bed with no acute distress. Thin and malnourished with muscle wasting EYES: Pupils equal, round, reactive to light and accommodation. No scleral icterus. Extraocular muscles intact.  HEENT: Head atraumatic, normocephalic. Oropharynx and nasopharynx clear. Patient has a well-healed scar on the right side of his neck. Some thickening of the skin and subcutaneous tissues on the left of the neck at the  lower jaw, no lymphadenopathy. Tenderness to palpation was felt just beneath the mandibular angle.  NECK:  Supple, no jugular venous distention. No thyroid enlargement, tenderness on palpation in upper neck area on the left.  LUNGS: Diminished breath sounds bilaterally, no wheezing, right sided rales,rhonchi and crepitations noted on auscultation anteriorly, less so posteriorly. No use of accessory muscles of respiration. Intermittent cough and sputum production CARDIOVASCULAR: S1, S2 normal. No murmurs, rubs, or gallops.  ABDOMEN: Soft, nontender, nondistended. Bowel sounds present. No organomegaly or mass.  EXTREMITIES: No pedal edema, cyanosis, or clubbing.  NEUROLOGIC: Cranial nerves II through XII are intact. Muscle strength 5/5 in all extremities. Sensation intact. Gait not checked.  PSYCHIATRIC: The patient is alert and oriented  x 3.  SKIN: No obvious rash, lesion, or ulcer.   LABORATORY PANEL:   CBC  Recent Labs Lab 07/03/15 1104  WBC 9.7  HGB 11.9*  HCT 35.8*  PLT 327  MCV 101.0*  MCH 33.7  MCHC 33.3  RDW 14.1  LYMPHSABS 1.2  MONOABS 0.8  EOSABS 0.0  BASOSABS 0.0   ------------------------------------------------------------------------------------------------------------------  Chemistries   Recent Labs Lab 07/03/15 1104  NA 136  K 4.0  CL 102  CO2 27  GLUCOSE 82  BUN <5*  CREATININE 0.73  CALCIUM 8.3*  AST 23  ALT 8*  ALKPHOS 81  BILITOT 0.1*   ------------------------------------------------------------------------------------------------------------------  Cardiac Enzymes No results for input(s): TROPONINI in the last 168 hours. ------------------------------------------------------------------------------------------------------------------  RADIOLOGY: No results found.  EKG: Orders placed or performed in visit on 09/30/11  . EKG 12-Lead    IMPRESSION AND PLAN:  Active Problems:   Malnutrition   Aspiration pneumonitis 1. Severe protein calorie malnutrition due to odynophagia, dysphagia due to tongue cancer, admit patient to medical floor, start him on IV fluids. Get speech therapist evaluation, ask constant neurologist to see patient for PEG tube placement, dietary consultation after PEG is placed to initiate tube feedings 2. Dysphagia, odynophagia, as above. Medications  will be continued as needed. Speech therapist evaluation will be done. Patient would benefit from modified  swallowing study 3. Aspiration pneumonitis. Initiate patient on Zosyn, get sputum culture 4. Anemia, guaiac follow with hydration and transfuse patient as needed 5. Tongue cancer get oncology consultation for advice in regards to therapy   All the records are reviewed and case discussed with ED provider. Management plans discussed with the patient, family and they are in agreement.  CODE  STATUS:  Full code  TOTAL TIME TAKING CARE OF THIS PATIENT: 60 minutes.  Evaluation, treatment plan was discussed with patient as well as his wife, all questions were answered, and they voiced understanding  Grant Lee M.D on 07/03/2015 at 1:34 PM  Between 7am to 6pm - Pager - [phone] After 6pm go to [email or web address] - password [email or web address] [facility]  Office  [phone]  CC: Primary care physician; Cyndi Bender, [name]

## 2015-07-03 NOTE — Progress Notes (Signed)
Patient c/o headache, patient is NPO, hx of recent bleeding ulcer. MD to put in orders for IV Tylenol.

## 2015-07-03 NOTE — Consult Note (Signed)
Consultation  Referring Provider:     Dr Ether Griffins Admit date   07/03/15 Consult date   07/03/15      Reason for Consultation:     PEG placement/malnutrition         HPI:   Grant Lee is a 59 y.o. 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 Over the last year or so,devlopled some dysphagia/odynophagia. Had EGD 10/19/14 for same with negative findings, done by Dr Tiffany Kocher. Has been on PPI. Stopped tobacco 11/15. Has been on Omeprazole '40mg'$  po qd for GERD and history PUD 2012.  Symptoms increased significantly over the last few weeks, associated with weight loss of 35-40lb. Had PET scan 06/14/15 showing hypermetabolic area from tongue base to epiglottis concerning for tumor regrowth, aspiration pneumonia, and post surgical changes to the groin. Head CT 06/21/15 unremarkable. 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 CXR 07/03/15 unremarkable. Currently on Zosyn for above pneumonia. NPO due to significant coughing and choking on speech therapist exam today.  Patient states speech eval to continue later, and that he understands he may have both chemotherapy and radiation and may need a PEG to aid with nutrition. . States that otherwise he feels well in his abdomen, and denies prior abdominal surgeries, abdominal pain/NVD/bowel habit changes/melena/hematochezia, and all other GI complaints.               Past Medical History  Diagnosis Date  . Hypertension   . Pneumonia     2004  . GERD (gastroesophageal reflux disease)   . Arthritis   . Cancer   . Anemia     blood transfusion 2012  . Bleeding ulcer   . Machinery accident     LEG INJURY    Past Surgical History  Procedure Laterality Date  . Leg surgery    . Joint replacement      right hip  . Tongue biopsy    . Tongue surgery      multiple surgeries  . Direct laryngoscopy N/A 06/26/2015    Procedure:  Laryngoscopy with tongue base biopsy ;  Surgeon: Beverly Gust, MD;  Location: [facility] ORS;  Service: ENT;  Laterality: N/A;    History reviewed. No pertinent family history. Denies family history of GI malignancies  Social History  Substance Use Topics  . Smoking status: Former Smoker -- 1.00 packs/day for 0 years    Types: Cigarettes    Quit date: 09/21/2014  . Smokeless tobacco: None  . Alcohol Use: Yes     States he is not currently intaking any etoh    Prior to Admission medications   Medication Sig Start Date End Date Taking? Authorizing Provider  lisinopril (PRINIVIL,ZESTRIL) 10 MG tablet 10 mg once at bedtime 08/29/14  Yes Historical Provider, MD  omeprazole (PRILOSEC) 40 MG capsule 40 mg once in the morning 09/29/14  Yes Historical Provider, MD  Oxycodone HCl 20 MG TABS 30 mg every 6 (six) hours. 09/12/14  Yes Historical Provider, MD  oxyCODONE-acetaminophen (PERCOCET) 7.5-325 MG per tablet Take 1 tablet by mouth every 4 (four) hours as needed for severe pain. 06/26/15  Yes Beverly Gust, MD  pravastatin (PRAVACHOL) 40 MG tablet 40 mg once bedtime 08/28/14  Yes Historical Provider, MD  Probiotic Product (PROBIOTIC DAILY) CAPS Take 1 capsule by mouth daily.    Yes Historical Provider, MD  traZODone (DESYREL) 50 MG tablet 50 mg nightly. 09/12/14  Yes Historical Provider,  MD    Current Facility-Administered Medications  Medication Dose Route Frequency Provider Last Rate Last Dose  . antiseptic oral rinse (CPC / CETYLPYRIDINIUM CHLORIDE 0.05%) solution 7 mL  7 mL Mouth Rinse q12n4p Theodoro Grist, MD      . budesonide-formoterol (SYMBICORT) 160-4.5 MCG/ACT inhaler 2 puff  2 puff Inhalation BID Theodoro Grist, MD      . chlorhexidine (PERIDEX) 0.12 % solution 15 mL  15 mL Mouth Rinse BID Theodoro Grist, MD      . dextrose 5 %-0.45 % sodium chloride infusion   Intravenous Continuous Theodoro Grist, MD      . enoxaparin (LOVENOX) injection 40 mg  40 mg Subcutaneous Q24H Theodoro Grist, MD       . ipratropium-albuterol (DUONEB) 0.5-2.5 (3) MG/3ML nebulizer solution 3 mL  3 mL Nebulization Q4H Theodoro Grist, MD   3 mL at 07/03/15 1611  . morphine 2 MG/ML injection 2 mg  2 mg Intravenous Q3H PRN Theodoro Grist, MD      . piperacillin-tazobactam (ZOSYN) IVPB 3.375 g  3.375 g Intravenous Q8H Theodoro Grist, MD        Allergies as of 07/03/2015  . (No Known Allergies)     Review of Systems:     Gen: Fatigue/ malaise. Denies any fever, chills, sweats, anorexia, weakness, , and sleep disorder CV: Denies chest pain, angina, palpitations, syncope, orthopnea, PND, peripheral edema, and claudication. Resp: Intermittent cough and yellow phlegm. Denies dyspnea at rest, dyspnea with exercise,  wheezing, coughing up blood, and pleurisy. GI: Denies vomiting blood, jaundice, and fecal incontinence.   Denies dysphagia or odynophagia. GU : Denies urinary burning, blood in urine, urinary frequency, urinary hesitancy, nocturnal urination, and urinary incontinence. MS: Denies joint pain, limitation of movement, and swelling, stiffness, low back pain, extremity pain. Denies muscle weakness, cramps, atrophy.  Derm: Denies rash, itching, dry skin, hives, moles, warts, or unhealing ulcers.  Psych: Some anxiety and sadness regarding disease recurrence. Denies depression,memory loss, suicidal ideation, hallucinations, paranoia, and confusion. Heme: Denies bruising, bleeding, and enlarged lymph nodes. Neuro:  Denies any headaches, dizziness, paresthesias. Endo:  Denies any problems with DM, thyroid, adrenal function.    Physical Exam:  Vital signs in last 24 hours: Temp:  [98 F (36.7 C)-98.1 F (36.7 C)] 98 F (36.7 C) (08/23 1449) Pulse Rate:  [66-90] 84 (08/23 1449) Resp:  [18-19] 19 (08/23 1449) BP: (128-159)/(76-108) 142/76 mmHg (08/23 1449) SpO2:  [89 %-100 %] 99 % (08/23 1449) Weight:  [64.864 kg (143 lb)] 64.864 kg (143 lb) (08/23 0953) Last BM Date: 07/03/15 General:   Pleasant man in  NAD Head:  Normocephalic and atraumatic. Eyes:   No icterus.   Conjunctiva pink. Ears:  Normal auditory acuity. Mouth: Mucosa pink moist, no lesions. Neck:  Supple; no masses felt. Do note well healed scar to right side of neck.  Lungs:  Respirations even and unlabored. Lungs clear to auscultation bilaterally.   No wheezes, crackles, or rhonchi.  Heart:  S1S2, RRR, no MRG. No edema. Abdomen:   Flat, soft, nondistended, nontender. Normal bowel sounds. No appreciable masses or hepatomegaly. No rebound signs or other peritoneal signs. Rectal:  Not performed.  Msk:  MAEW x4, No clubbing or cyanosis. Strength 5/5. Symmetrical without gross deformities. Neurologic:  Alert and  oriented x4;  Cranial nerves II-XII intact.  Skin:  Warm, dry, pink without significant lesions or rashes. Psych:  Alert and cooperative. Normal affect.  LAB RESULTS:  Recent Labs  07/03/15 1104  WBC 9.7  HGB 11.9*  HCT 35.8*  PLT 327   BMET  Recent Labs  07/03/15 1104  NA 136  K 4.0  CL 102  CO2 27  GLUCOSE 82  BUN <5*  CREATININE 0.73  CALCIUM 8.3*   LFT  Recent Labs  07/03/15 1104  PROT 6.0*  ALBUMIN 2.6*  AST 23  ALT 8*  ALKPHOS 81  BILITOT 0.1*   PT/INR No results for input(s): LABPROT, INR in the last 72 hours.  STUDIES: Dg Chest 2 View  07/03/2015   CLINICAL DATA:  Cough, history of tongue cancer  EXAM: CHEST  2 VIEW  COMPARISON:  01/24/2015  FINDINGS: Cardiomediastinal silhouette is stable. Hyperinflation again noted. Acute infiltrate or pulmonary edema. Stable chronic interstitial prominence. Old left rib fracture again noted.  IMPRESSION: No active disease. Hyperinflation again noted. Stable chronic interstitial prominence.   Electronically Signed   By: Lahoma Crocker M.D.   On: 07/03/2015 14:13       Impression / Plan:   1. Weight loss, dysphagia/odynophagia, malnutrition. Will discuss PEG placement with Dr Gustavo Lah. Recommend completing speech evaluation to determine if patient  is able to eat anything safely, also consider NG feedings v. TPN as well.  Thank you very much for this consult. These services were provided by Stephens November, NP-C, in collaboration with Lollie Sails, MD, with whom I have discussed this patient in full.   Stephens November, NP-C  Addendum: did discuss with Dr Gustavo Lah: will need to coordinate with Dr Rayann Heman and plan for PEG placement in the next 1-2d.  Will assess PT/INR in am due to etoh history

## 2015-07-03 NOTE — Progress Notes (Signed)
ANTIBIOTIC CONSULT NOTE - INITIAL  Pharmacy Consult for zosyn Indication: aspiration pneumonia  No Known Allergies  Patient Measurements: Height: '6\' 2"'$  (188 cm) Weight: 143 lb (64.864 kg) IBW/kg (Calculated) : 82.2  Vital Signs: Temp: 98.1 F (36.7 C) (08/23 0953) Temp Source: Oral (08/23 0953) BP: 146/90 mmHg (08/23 1330) Pulse Rate: 78 (08/23 1330)  Labs:  Recent Labs  07/03/15 1104  WBC 9.7  HGB 11.9*  PLT 327  CREATININE 0.73   Estimated Creatinine Clearance: 91.3 mL/min (by C-G formula based on Cr of 0.73). No results for input(s): VANCOTROUGH, VANCOPEAK, VANCORANDOM, GENTTROUGH, GENTPEAK, GENTRANDOM, TOBRATROUGH, TOBRAPEAK, TOBRARND, AMIKACINPEAK, AMIKACINTROU, AMIKACIN in the last 72 hours.   Microbiology: No results found for this or any previous visit (from the past 720 hour(s)).  Medical History: Past Medical History  Diagnosis Date  . Hypertension   . Pneumonia     2004  . GERD (gastroesophageal reflux disease)   . Arthritis   . Cancer   . Anemia     blood transfusion 2012  . Bleeding ulcer   . Machinery accident     LEG INJURY   Assessment: Pharmacy consulted to dose zosyn for aspiration pneumonia in this 59 year old male.  Plan:  Ordered zosyn 3.375gm IV Q8H extended infusion. Pharmacy to follow per consult.  Rexene Edison, PharmD Clinical Pharmacist  07/03/2015,2:12 PM

## 2015-07-03 NOTE — Consult Note (Signed)
ONCOLOGY CONSULTATION NOTE  -  Reason for Consultation: Recently diagnosed recurrent head and neck cancer, admitted with unintentional weight loss, inability to eat/drink, dehydration.   History of Present Illness:  Patient is a 59 year old gentleman with past medical history as stated below, patient has a history of right-sided tongue cancer in 2006. States that he did continue intermittently smoking up until December 2015 and has quit since then. More recently he was found to have positive left tongue base lesion and underwent biopsy on 06/26/2015 which is positive for invasive squamous cell carcinoma. Patient states that he has been trying to eat better but has had 30-40 pound unintentional weight loss. He has become extremely weak and intermittently lightheaded on getting up and ambulating. Denies any falls, loss of consciousness or headaches. No bleeding symptoms. He has been referred to emergency room by her ENT doctor Tami Ribas and has been hospitalized for IV hydration and is planned for GI consultation to consider PEG tube placement. Currently this finishes. Denies fever or chills. No vomiting or diarrhea.   Past Medical History  Diagnosis Date  . Hypertension   . Pneumonia     2004  . GERD (gastroesophageal reflux disease)   . Arthritis   . Cancer   . Anemia     blood transfusion 2012  . Bleeding ulcer   . Machinery accident     LEG INJURY    Patient Active Problem List   Diagnosis Date Noted  . Malnutrition 07/03/2015  . Aspiration pneumonitis 07/03/2015  . DU (duodenal ulcer) 06/11/2015  . Tongue cancer 06/11/2015  . Abnormal loss of weight 10/09/2014  . Can't get food down 10/09/2014    Past Surgical History  Procedure Laterality Date  . Leg surgery    . Joint replacement      right hip  . Tongue biopsy    . Tongue surgery      multiple surgeries  . Direct laryngoscopy N/A  06/26/2015    Procedure: Laryngoscopy with tongue base biopsy ; Surgeon: Beverly Gust, MD; Location: [facility] ORS; Service: ENT; Laterality: N/A;    Past Surgical History  Procedure Laterality Date  . Leg surgery    . Joint replacement      right hip  . Tongue biopsy    . Tongue surgery      multiple surgeries  . Direct laryngoscopy N/A 06/26/2015    Procedure: Laryngoscopy with tongue base biopsy ; Surgeon: Beverly Gust, MD; Location: [facility] OR[facility]; Service: ENT; Laterality: N/A;    No current outpatient prescriptions on file.  Allergies:  Review of patient's allergies indicates no known allergies.  Family History: History reviewed. No pertinent family history.  Social History: Social History  Substance Use Topics  . Smoking status: Former Smoker -- 1.00 packs/day for 0 years    Types: Cigarettes    Quit date: 09/21/2014  . Smokeless tobacco: None  . Alcohol Use: Yes     Comment: beer/wine every day     Review of Systems: Constitutional: As in history of present illness above. No fever or chills.   HEENT: No headaches, epistaxis, ear or jaw pain. Cardiac: No chest pain Respiratory: No shortness of breath, wheezing, or stridor Abdomen: No abdominal pain, no vomiting, No diarrhea Endocrine: Weight loss. No polyuria polydipsia Extremities: No new swelling or pain Skin: No rashes, easy bruising Urologic: No dysuria, Hematuria. Neurologic: No focal weakness or seizures  Physical Exam -   Vitals - 98, 84, 19, 1 4276, 95% on room air General: Patient is weak, poorly nourished thin individual, resting in bed, otherwise alert and oriented in no acute distress. No icterus.  HEENT - extraocular movements intact. No oral thrush, mouth is dry. Thickening left side of his tongue extending posteriorly. No stridor. CVS: S1 and S2,  regular Lungs: Clear to ascultation without wheezes , rhonchi, or rales Abdomen: Soft, nontender. No hepatomegaly .  Extremities:  No edema, clubbing or cyanosis Neurologic: Cranial nerves intact, moves all extremities spontaneously, gait not checked. Skin: no rashes   Labs:    COMPREHENSIVE METABOLIC PANEL - Abnormal; Notable for the following:    BUN <5 (*)    Calcium 8.3 (*)    Total Protein 6.0 (*)    Albumin 2.6 (*)    ALT 8 (*)    Total Bilirubin 0.1 (*)    All other components within normal limits  CBC WITH DIFFERENTIAL/PLATELET - Abnormal; Notable for the following:    RBC 3.54 (*)    Hemoglobin 11.9 (*)    HCT 35.8 (*)    MCV 101.0 (*)    Neutro Abs 7.6 (*)    All other components within normal limits       06/26/15 - Pathology Report: DIAGNOSIS:  A. TONGUE, LEFT BASE; BIOPSY:  INVASIVE SQUAMOUS CELL CARCINOMA, KERATINIZING TYPE. ULCERATION AND NECROSIS.  B. TONGUE, MIDDLE BASE: BIOPSY: NEGATIVE FOR DYSPLASIA AND MALIGNANCY.  C. TONGUE, RIGHT BASE; BIOPSY: NEGATIVE FOR DYSPLASIA AND MALIGNANCY.   IMPRESSION / RECOMMENDATIONS: Patient is a 59 year old gentleman with prior history of right-sided tongue cancer in 2006, more recently diagnosed to have a mass in the left tongue base abnormal PET scan and biopsy positive for invasive squamous cell carcinoma. Patient is currently admitted to the hospital with 30-40 pound unintentional weight loss despite trying to eat some, has difficulty eating and drinking due to malignancy. Agree with ongoing supportive treatment, IV fluids for dehydration, GI consultation for PEG tube placement since he would have further difficulty adequately taking oral nutrition once he starts on radiation and/or chemotherapy. Pain is fairly well-controlled at this time, continue on current medication. Will notify his primary oncologist Dr.Choksi for continuing follow-up when he returns on Thursday and  make further plan of management. Patient and family pleasant explained above, they are agreeable to this plan. Thank you for the referral, please feel free to contact me if any additional questions.

## 2015-07-04 ENCOUNTER — Inpatient Hospital Stay: Payer: Medicare Other

## 2015-07-04 LAB — CBC
HCT: 35 % — ABNORMAL LOW (ref 40.0–52.0)
Hemoglobin: 11.7 g/dL — ABNORMAL LOW (ref 13.0–18.0)
MCH: 34.1 pg — AB (ref 26.0–34.0)
MCHC: 33.5 g/dL (ref 32.0–36.0)
MCV: 101.8 fL — AB (ref 80.0–100.0)
PLATELETS: 295 10*3/uL (ref 150–440)
RBC: 3.44 MIL/uL — AB (ref 4.40–5.90)
RDW: 14.3 % (ref 11.5–14.5)
WBC: 10.6 10*3/uL (ref 3.8–10.6)

## 2015-07-04 LAB — PROTIME-INR
INR: 1.15
PROTHROMBIN TIME: 14.9 s (ref 11.4–15.0)

## 2015-07-04 LAB — BASIC METABOLIC PANEL
Anion gap: 4 — ABNORMAL LOW (ref 5–15)
CALCIUM: 8.1 mg/dL — AB (ref 8.9–10.3)
CHLORIDE: 106 mmol/L (ref 101–111)
CO2: 27 mmol/L (ref 22–32)
CREATININE: 0.59 mg/dL — AB (ref 0.61–1.24)
GFR calc Af Amer: >60 mL/min (ref >60)
GFR calc non Af Amer: >60 mL/min (ref >60)
GLUCOSE: 124 mg/dL — AB (ref 65–99)
Potassium: 3.6 mmol/L (ref 3.5–5.1)
Sodium: 137 mmol/L (ref 135–145)

## 2015-07-04 MED ORDER — IPRATROPIUM-ALBUTEROL 0.5-2.5 (3) MG/3ML IN SOLN: 3.0000 mL | RESPIRATORY_TRACT | Status: DC | PRN

## 2015-07-04 MED ORDER — ACETAMINOPHEN 10 MG/ML IV SOLN
1000.0000 mg | Freq: Once | INTRAVENOUS | Status: AC
  Administered 2015-07-04: 1000 mg via INTRAVENOUS
  Filled 2015-07-04: qty 100

## 2015-07-04 MED ORDER — PROMETHAZINE HCL 25 MG/ML IJ SOLN
25.0000 mg | Freq: Once | INTRAMUSCULAR | Status: AC
  Administered 2015-07-04: 11:00:00 25 mg via INTRAMUSCULAR
  Filled 2015-07-04: qty 1

## 2015-07-04 MED ORDER — MEPERIDINE HCL 25 MG/ML IJ SOLN
25.0000 mg | Freq: Once | INTRAMUSCULAR | Status: AC
  Administered 2015-07-04: 25 mg via INTRAMUSCULAR
  Filled 2015-07-04: qty 1

## 2015-07-04 MED ORDER — ENALAPRILAT 1.25 MG/ML IV SOLN
0.6250 mg | Freq: Four times a day (QID) | INTRAVENOUS | Status: DC
  Administered 2015-07-04 – 2015-07-05 (×4): 0.625 mg via INTRAVENOUS
  Filled 2015-07-04 (×8): qty 0.5

## 2015-07-04 MED ORDER — LORAZEPAM 2 MG/ML IJ SOLN
1.0000 mg | Freq: Once | INTRAMUSCULAR | Status: AC
  Administered 2015-07-04: 1 mg via INTRAVENOUS
  Filled 2015-07-04: qty 1

## 2015-07-04 MED ORDER — HYDROMORPHONE HCL 1 MG/ML IJ SOLN
2.0000 mg | INTRAMUSCULAR | Status: DC | PRN
  Administered 2015-07-04 – 2015-07-07 (×18): 2 mg via INTRAVENOUS
  Filled 2015-07-04 (×18): qty 2

## 2015-07-04 MED ORDER — FENTANYL 25 MCG/HR TD PT72
25.0000 ug | MEDICATED_PATCH | TRANSDERMAL | Status: DC
  Administered 2015-07-04: 11:00:00 25 ug via TRANSDERMAL
  Filled 2015-07-04: qty 1

## 2015-07-04 NOTE — Consult Note (Signed)
Subjective: Patient seen for peg placement.  Patient had SLP evaluation today.  Patient denies nausea or abdominal pain.  Objective: Vital signs in last 24 hours: Temp:  [98.2 F (36.8 C)-98.3 F (36.8 C)] 98.3 F (36.8 C) (08/24 2020) Pulse Rate:  [51-70] 70 (08/24 2020) BP: (160-177)/(78-98) 177/98 mmHg (08/24 2020) SpO2:  [99 %] 99 % (08/24 1310) Blood pressure 177/98, pulse 70, temperature 98.3 F (36.8 C), temperature source Oral, resp. rate 19, height '6\' 2"'$  (1.88 m), weight 64.864 kg (143 lb), SpO2 99 %.   Intake/Output from previous day: 08/23 0701 - 08/24 0700 In: -  Out: 250 [Urine:250]  Intake/Output this shift:     General appearance:  Thin,nad Resp:  bcta Cardio:  rrr GI:  Soft, nt/nd, bowel sounds positive normoactive.  Extremities:  No cce   Lab Results: Results for orders placed or performed during the hospital encounter of 07/03/15 (from the past 24 hour(s))  Basic metabolic panel     Status: Abnormal   Collection Time: 07/04/15  5:39 AM  Result Value Ref Range   Sodium 137 135 - 145 mmol/L   Potassium 3.6 3.5 - 5.1 mmol/L   Chloride 106 101 - 111 mmol/L   CO2 27 22 - 32 mmol/L   Glucose, Bld 124 (H) 65 - 99 mg/dL   BUN <5 (L) 6 - 20 mg/dL   Creatinine, Ser 0.59 (L) 0.61 - 1.24 mg/dL   Calcium 8.1 (L) 8.9 - 10.3 mg/dL   GFR calc non Af Amer >60 >60 mL/min   GFR calc Af Amer >60 >60 mL/min   Anion gap 4 (L) 5 - 15  CBC     Status: Abnormal   Collection Time: 07/04/15  5:39 AM  Result Value Ref Range   WBC 10.6 3.8 - 10.6 K/uL   RBC 3.44 (L) 4.40 - 5.90 MIL/uL   Hemoglobin 11.7 (L) 13.0 - 18.0 g/dL   HCT 35.0 (L) 40.0 - 52.0 %   MCV 101.8 (H) 80.0 - 100.0 fL   MCH 34.1 (H) 26.0 - 34.0 pg   MCHC 33.5 32.0 - 36.0 g/dL   RDW 14.3 11.5 - 14.5 %   Platelets 295 150 - 440 K/uL  Protime-INR     Status: None   Collection Time: 07/04/15  5:39 AM  Result Value Ref Range   Prothrombin Time 14.9 11.4 - 15.0 seconds   INR 1.15       Recent Labs  07/03/15 1104 07/04/15 0539  WBC 9.7 10.6  HGB 11.9* 11.7*  HCT 35.8* 35.0*  PLT 327 295   BMET  Recent Labs  07/03/15 1104 07/04/15 0539  NA 136 137  K 4.0 3.6  CL 102 106  CO2 27 27  GLUCOSE 82 124*  BUN <5* <5*  CREATININE 0.73 0.59*  CALCIUM 8.3* 8.1*   LFT  Recent Labs  07/03/15 1104  PROT 6.0*  ALBUMIN 2.6*  AST 23  ALT 8*  ALKPHOS 81  BILITOT 0.1*   PT/INR  Recent Labs  07/04/15 0539  LABPROT 14.9  INR 1.15   Hepatitis Panel No results for input(s): HEPBSAG, HCVAB, HEPAIGM, HEPBIGM in the last 72 hours. C-Diff No results for input(s): CDIFFTOX in the last 72 hours. No results for input(s): CDIFFPCR in the last 72 hours.   Studies/Results: Dg Chest 2 View  07/03/2015   CLINICAL DATA:  Cough, history of tongue cancer  EXAM: CHEST  2 VIEW  COMPARISON:  01/24/2015  FINDINGS: Cardiomediastinal silhouette is  stable. Hyperinflation again noted. Acute infiltrate or pulmonary edema. Stable chronic interstitial prominence. Old left rib fracture again noted.  IMPRESSION: No active disease. Hyperinflation again noted. Stable chronic interstitial prominence.   Electronically Signed   By: Lahoma Crocker M.D.   On: 07/03/2015 14:13    Scheduled Inpatient Medications:   . antiseptic oral rinse  7 mL Mouth Rinse q12n4p  . budesonide-formoterol  2 puff Inhalation BID  . chlorhexidine  15 mL Mouth Rinse BID  . enalaprilat  0.625 mg Intravenous 4 times per day  . enoxaparin (LOVENOX) injection  40 mg Subcutaneous Q24H  . fentaNYL  25 mcg Transdermal Q72H    Continuous Inpatient Infusions:   . dextrose 5 % and 0.45% NaCl 125 mL/hr at 07/04/15 1526    PRN Inpatient Medications:  HYDROmorphone (DILAUDID) injection, ipratropium-albuterol  Miscellaneous:   Assessment:  1) malnutrition in the setting of recurrent tongue cancer.  Case discussed with Dr Tami Ribas, posterior pharynx open enough for scope and passage of Peg during placement.   Plan:  1)peg scheduled  for Friday.  I will discuss SLP testing with SLP for possible dietary recs before then.   Lollie Sails MD 07/04/2015, 9:41 PM

## 2015-07-04 NOTE — Care Management (Signed)
Case management consult placed for medication needs.  Patient requested that I come back at a later time has he is resting at the time of attempted assessment

## 2015-07-04 NOTE — Progress Notes (Signed)
Dr. Lavetta Nielsen notified of pt complaint of headache, orders received.

## 2015-07-04 NOTE — Procedures (Signed)
Objective Swallowing Evaluation: Other (Comment) (MBSS)  Patient Details  Name: Grant Lee MRN: [id number] Date of Birth: December 11, 1955  Today's Date: 07/04/2015 Time: SLP Start Time (ACUTE ONLY): 1030-SLP Stop Time (ACUTE ONLY): 1130 SLP Time Calculation (min) (ACUTE ONLY): 60 min  Past Medical History:  Past Medical History  Diagnosis Date  . Hypertension   . Pneumonia     2004  . GERD (gastroesophageal reflux disease)   . Arthritis   . Cancer   . Anemia     blood transfusion 2012  . Bleeding ulcer   . Machinery accident     LEG INJURY   Past Surgical History:  Past Surgical History  Procedure Laterality Date  . Leg surgery    . Joint replacement      right hip  . Tongue biopsy    . Tongue surgery      multiple surgeries  . Direct laryngoscopy N/A 06/26/2015    Procedure: Laryngoscopy with tongue base biopsy ;  Surgeon: Beverly Gust, MD;  Location: [facility] ORS;  Service: ENT;  Laterality: N/A;   HPI:  Other Pertinent Information: Grant Lee is a 59 y.o. male with a known history of right tongue cancer status post operation in 2006, who presents to the hospital with complaints of difficulty swallowing, pain in her throat area on the left and significant weight loss. Upon intubation, he was doing well up until approximately 8 weeks or so ago when he started having difficulty swallowing. He lost approximately 35-40 pounds over the past 8 weeks. He feels dehydrated and has significant pain in the throat area on the left. He cannot swallow well. Aggressive pain. He underwent direct laryngoscopy was tongue base biopsy on 06/26/2015 by Dr. Tami Ribas, which revealed invasive squamous cell carcinoma. Patient underwent PET scanning on 06/14/2015 which revealed left side of base of tongue hypermetabolic focus extending to epiglottis. Patient now presents with hopes that he will undergo radiation or chemotherapy for his lung cancer and the PEG tube is going to be placed  during this admission. The patient is complaining of cough and yellow to green sputum production for the past few weeks now. PET scan done a few weeks ago reveals aspiration pneumonitis.   No Data Recorded  Assessment / Plan / Recommendation CHL IP CLINICAL IMPRESSIONS 07/04/2015  Therapy Diagnosis Mild oral phase dysphagia;Severe pharyngeal phase dysphagia  Clinical Impression Pt presents at significant risk of aspiration w/ all tested consistencies. Pt demonstrated mild oral phase deficits c/b decreased mastication of solid d/t pt edentulous. Pt presents w/severe pharyngeal dysphagia c/b delay in swallow initiation, reduced anterior laryngeal moblity, decreased airway closure and decreased clearing of residue. Pt also observed to have difficult to distinguish pyriform sinuse and epiglottis. Unsure if this is because of previous surgery on tongue cancer. These deficits resulted in laryngeal penetration and/or aspiration with all tested constistencies. The majority of laryngeal penetration and aspiration was silent. Cues to cough or throat clear appeared to clear most of aspirated material. Attempted multiple swallows, decreased bolus size and chin tuck. Multiple swallows decreased pharyngeal residue but did not clear and even small amounts of residue were observed to travel from pyriforms into airway where it was aspirated into posterior trachea. Recommend continue w/NPO with ice chips and strict aspiration precautions for pleasure. ST to f/u re: further exercises to aid in maintaining pharyngeal swallow as pt may begin further radiation tx.       CHL IP TREATMENT RECOMMENDATION 07/04/2015  Treatment Recommendations Therapy as outlined in  treatment plan below     CHL IP DIET RECOMMENDATION 07/04/2015  SLP Diet Recommendations NPO;Ice chips PRN after oral care  Liquid Administration via (None)  Medication Administration (None)  Compensations Other (Comment)  Postural Changes and/or Swallow Maneuvers  (None)     CHL IP OTHER RECOMMENDATIONS 07/04/2015  Recommended Consults Consider GI evaluation  Oral Care Recommendations Oral care QID  Other Recommendations (None)     No flowsheet data found.   CHL IP FREQUENCY AND DURATION 07/04/2015  Speech Therapy Frequency (ACUTE ONLY) min 3x week  Treatment Duration 1 week     Pertinent Vitals/Pain denies    SLP Swallow Goals No flowsheet data found.  No flowsheet data found.    CHL IP REASON FOR REFERRAL 07/04/2015  Reason for Referral Objectively evaluate swallowing function     CHL IP ORAL PHASE 07/04/2015  Lips (None)  Tongue (None)  Mucous membranes (None)  Nutritional status (None)  Other (None)  Oxygen therapy (None)  Oral Phase Impaired  Oral - Pudding Teaspoon (None)  Oral - Pudding Cup (None)  Oral - Honey Teaspoon (None)  Oral - Honey Cup (None)  Oral - Honey Syringe (None)  Oral - Nectar Teaspoon (None)  Oral - Nectar Cup (None)  Oral - Nectar Straw (None)  Oral - Nectar Syringe (None)  Oral - Ice Chips (None)  Oral - Thin Teaspoon (None)  Oral - Thin Cup (None)  Oral - Thin Straw (None)  Oral - Thin Syringe (None)  Oral - Puree (None)  Oral - Mechanical Soft (None)  Oral - Regular (None)  Oral - Multi-consistency (None)  Oral - Pill (None)  Oral Phase - Comment (None)  Pt demonstrated mild oral phase deficits d/t decreased mastication d/t pt edentulous.    CHL IP PHARYNGEAL PHASE 07/04/2015  Pharyngeal Phase Impaired  Pharyngeal - Pudding Teaspoon (None)  Penetration/Aspiration details (pudding teaspoon) (None)  Pharyngeal - Pudding Cup (None)  Penetration/Aspiration details (pudding cup) (None)  Pharyngeal - Honey Teaspoon (None)  Penetration/Aspiration details (honey teaspoon) (None)  Pharyngeal - Honey Cup (None)  Penetration/Aspiration details (honey cup) (None)  Pharyngeal - Honey Syringe (None)  Penetration/Aspiration details (honey syringe) (None)  Pharyngeal - Nectar Teaspoon (None)   Penetration/Aspiration details (nectar teaspoon) (None)  Pharyngeal - Nectar Cup (None)  Penetration/Aspiration details (nectar cup) (None)  Pharyngeal - Nectar Straw (None)  Penetration/Aspiration details (nectar straw) (None)  Pharyngeal - Nectar Syringe (None)  Penetration/Aspiration details (nectar syringe) (None)  Pharyngeal - Ice Chips (None)  Penetration/Aspiration details (ice chips) (None)  Pharyngeal - Thin Teaspoon (None)  Penetration/Aspiration details (thin teaspoon) (None)  Pharyngeal - Thin Cup (None)  Penetration/Aspiration details (thin cup) (None)  Pharyngeal - Thin Straw (None)  Penetration/Aspiration details (thin straw) (None)  Pharyngeal - Thin Syringe (None)  Penetration/Aspiration details (thin syringe') (None)  Pharyngeal - Puree (None)  Penetration/Aspiration details (puree) (None)  Pharyngeal - Mechanical Soft (None)  Penetration/Aspiration details (mechanical soft) (None)  Pharyngeal - Regular (None)  Penetration/Aspiration details (regular) (None)  Pharyngeal - Multi-consistency (None)  Penetration/Aspiration details (multi-consistency) (None)  Pharyngeal - Pill (None)  Penetration/Aspiration details (pill) (None)  Pharyngeal Comment Pt presents w/severe pharyngeal dysphagia c/b deficits in swallow initiation, reduced anterior laryngeal moblity, decreased airway closure and decreased clearing of residue. These deficits resulted in laryngeal penetration and/or aspiration with all tested constistencies. The majority of laryngeal penetration and aspiration was silent. Cues to cough or throat clear appeared to clear most of aspirated material. Attempted multiple swallows, decreased bolus size and  chin tuck. Multiple swallows decreased pharyngeal residue but did not clear and even small amounts of residue were observed to travel from pyriforms into airway where it was aspirated into posterior trachea      CHL IP CERVICAL ESOPHAGEAL PHASE 07/04/2015  Cervical  Esophageal Phase WFL  Pudding Teaspoon (None)  Pudding Cup (None)  Honey Teaspoon (None)  Honey Cup (None)  Honey Straw (None)  Nectar Teaspoon (None)  Nectar Cup (None)  Nectar Straw (None)  Nectar Sippy Cup (None)  Thin Teaspoon (None)  Thin Cup (None)  Thin Straw (None)  Thin Sippy Cup (None)  Cervical Esophageal Comment (None)    No flowsheet data found.         ,Maaran 07/04/2015, 12:59 PM

## 2015-07-04 NOTE — Progress Notes (Signed)
Initial Nutrition Assessment  DOCUMENTATION CODES:   Severe malnutrition in context of chronic illness  INTERVENTION:   Coordination of Care: will await poc and placement of PEG tube and will make recommendations accordingly. Will also recommend checking Mg and P as pt will be at risk for refeeding syndrome when nutrition able to be started.   NUTRITION DIAGNOSIS:   Malnutrition related to cancer and cancer related treatments, dysphagia as evidenced by percent weight loss, energy intake < 75% for > or equal to 1 month, severe depletion of body fat, severe depletion of muscle mass.  GOAL:   Patient will meet greater than or equal to 90% of their needs  MONITOR:    (Energy Intake, Anthropometrics, digestive system, Electrolyte and Renal Profile)  REASON FOR ASSESSMENT:   Consult, Malnutrition Screening Tool Poor PO  ASSESSMENT:   Pt admitted with dysphagia secondary to tongue cancer. Possible PEG placement tomorrow. Pt s/p MBSS today, no diet order recommended at this time,  Past Medical History  Diagnosis Date  . Hypertension   . Pneumonia     2004  . GERD (gastroesophageal reflux disease)   . Arthritis   . Cancer   . Anemia     blood transfusion 2012  . Bleeding ulcer   . Machinery accident     LEG INJURY    Diet Order:  Diet NPO time specified    Current Nutrition: Pt NPO  Food/Nutrition-Related History: Pt reports the last thing he had was 2.5 days ago and 'didn't really have much of anything then.' Pt reports poor po intake progressively for the past couple of months secondary to dysphagia. Pt reports no supplements consumed PTA.   Medications: fentanyl, dilaudid, D5 0.45%NS at 119m/hr (providing 510kcals in 24 hours)  Electrolyte/Renal Profile and Glucose Profile:   Recent Labs Lab 07/03/15 1104 07/04/15 0539  NA 136 137  K 4.0 3.6  CL 102 106  CO2 27 27  BUN <5* <5*  CREATININE 0.73 0.59*  CALCIUM 8.3* 8.1*  GLUCOSE 82 124*   Protein  Profile:  Recent Labs Lab 07/03/15 1104  ALBUMIN 2.6*    Gastrointestinal Profile: Last BM: 07/03/2015   Nutrition-Focused Physical Exam Findings: Nutrition-Focused physical exam completed. Findings are moderate-severe fat depletion, moderate-severe muscle depletion, and no edema.     Weight Change: Pt reports weight of 176lbs 6 months ago (19% weight loss)  Height:   Ht Readings from Last 1 Encounters:  07/03/15 '6\' 2"'$  (1.88 m)    Weight:   Wt Readings from Last 1 Encounters:  07/03/15 143 lb (64.864 kg)    Ideal Body Weight:   86kg  BMI:  Body mass index is 18.35 kg/(m^2).  Estimated Nutritional Needs:   Kcal:  BEE: 1744kcals, TEE: (IF 1.1-1.3)(AF 1.2) 2296-2713kcals, using IBW of 86kg  Protein:  86-103g of protein (1.0-1.2g/kg) using IBW of 86kg  Fluid:  2150-25869mof fluid (25-3017mg) using IBW of 86kg  EDUCATION NEEDS:   Education needs no appropriate at this time  HIGKawela BayD, LDN Pager [phone]

## 2015-07-04 NOTE — Progress Notes (Signed)
[facility] NAME: Grant Lee    MR#:  [id number]  DATE OF BIRTH:  1956/10/26  SUBJECTIVE:  Came in with poor po intake due to dysphagia. Dry oral mucosa  REVIEW OF SYSTEMS:   Review of Systems  Constitutional: Positive for malaise/fatigue. Negative for fever, chills and weight loss.  HENT: Negative for ear discharge, ear pain and nosebleeds.   Eyes: Negative for blurred vision, pain and discharge.  Respiratory: Negative for sputum production, shortness of breath, wheezing and stridor.   Cardiovascular: Negative for chest pain, palpitations, orthopnea and PND.  Gastrointestinal: Positive for nausea. Negative for vomiting, abdominal pain and diarrhea.  Genitourinary: Negative for urgency and frequency.  Musculoskeletal: Negative for back pain and joint pain.  Neurological: Positive for weakness and headaches. Negative for sensory change, speech change and focal weakness.  Psychiatric/Behavioral: Negative for depression and hallucinations. The patient is not nervous/anxious.   All other systems reviewed and are negative.  Tolerating Diet:no  DRUG ALLERGIES:  No Known Allergies  VITALS:  Blood pressure 177/91, pulse 51, temperature 98.2 F (36.8 C), temperature source Oral, resp. rate 19, height '6\' 2"'$  (1.88 m), weight 64.864 kg (143 lb), SpO2 99 %.  PHYSICAL EXAMINATION:   Physical Exam  GENERAL:  59 y.o.-year-old patient lying in the bed with no acute distress. Cachectic, thin EYES: Pupils equal, round, reactive to light and accommodation. No scleral icterus. Extraocular muscles intact.  HEENT: Head atraumatic, normocephalic. Oropharynx and nasopharynx clear. Oral chronic changes due to cancer/radiation NECK:  Supple, no jugular venous distention. No thyroid enlargement, no tenderness.  LUNGS: Normal breath sounds bilaterally, no wheezing, rales, rhonchi. No use of accessory muscles of respiration.   CARDIOVASCULAR: S1, S2 normal. No murmurs, rubs, or gallops.  ABDOMEN: Soft, nontender, nondistended. Bowel sounds present. No organomegaly or mass.  EXTREMITIES: No cyanosis, clubbing or edema b/l.    NEUROLOGIC: Cranial nerves II through XII are intact. No focal Motor or sensory deficits b/l.   PSYCHIATRIC: The patient is alert and oriented x 3.  SKIN: No obvious rash, lesion, or ulcer.    LABORATORY PANEL:   CBC  Recent Labs Lab 07/04/15 0539  WBC 10.6  HGB 11.7*  HCT 35.0*  PLT 295    Chemistries   Recent Labs Lab 07/03/15 1104 07/04/15 0539  NA 136 137  K 4.0 3.6  CL 102 106  CO2 27 27  GLUCOSE 82 124*  BUN <5* <5*  CREATININE 0.73 0.59*  CALCIUM 8.3* 8.1*  AST 23  --   ALT 8*  --   ALKPHOS 81  --   BILITOT 0.1*  --     Cardiac Enzymes No results for input(s): TROPONINI in the last 168 hours.  RADIOLOGY:  Dg Chest 2 View  07/03/2015   CLINICAL DATA:  Cough, history of tongue cancer  EXAM: CHEST  2 VIEW  COMPARISON:  01/24/2015  FINDINGS: Cardiomediastinal silhouette is stable. Hyperinflation again noted. Acute infiltrate or pulmonary edema. Stable chronic interstitial prominence. Old left rib fracture again noted.  IMPRESSION: No active disease. Hyperinflation again noted. Stable chronic interstitial prominence.   Electronically Signed   By: Lahoma Crocker M.D.   On: 07/03/2015 14:13     ASSESSMENT AND PLAN:  59 y.o. male with a known history of right tongue cancer status post operation in 2006, who presents to the hospital with complaints of difficulty swallowing, pain in her throat area on the left and significant weight loss  1. Severe protein calorie malnutrition due to odynophagia, dysphagia due to tongue cancer -seen by Oncology and GI and PEG placement is recommended -pt taking ice for pleasure due to severe dry oral mucosa. -seen by Speech therapist   2. Dysphagia, odynophagia, as above. Medications will be continued as needed. Speech therapist  evaluation  Noted and pt underwent modified swallowing study -shows s/o aspiration No cxr findings of aspiration -d/c iv zosyn  3. 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 Anemia, guaiac follow with hydration and transfuse patient as needed  Case discussed with Care Management/Social Worker. Management plans discussed with the patient, family and they are in agreement.  CODE STATUS: full  DVT Prophylaxis: lovenox  TOTAL TIME TAKING CARE OF THIS PATIENT: 35 minutes.  >50% time spent on counselling and coordination of care pt and RN  POSSIBLE D/C IN 1-2 DAYS, DEPENDING ON CLINICAL CONDITION.   Grant Lee M.D on 07/04/2015 at 1:42 PM  Between 7am to 6pm - Pager - [phone]  After 6pm go to [email or web address] - password [email or web address] [facility]  Office  [phone]  CC: Primary care physician; Cyndi Bender, [name]

## 2015-07-04 NOTE — Plan of Care (Signed)
Problem: Discharge Progression Outcomes Goal: Other Discharge Outcomes/Goals Outcome: Progressing Patient is alert and oriented, c/o pain in this throat. Remains NPO, awaiting swallow screen. Dilaudid given q2 hours per patient request. Pt began complaining of headache. 1 g of Tylenol IV given which helped to relieve the headache around 2330. Around 0215 headache is worsening again. Dr. Jannifer [facility] notified. D/t diet status, hx of bleeding ulcers, unable to proceed with further medication at this time. D5 with 1/2 NS at 125 ml/hr continued.

## 2015-07-05 ENCOUNTER — Ambulatory Visit: Payer: Medicare Other | Admitting: Radiation Oncology

## 2015-07-05 DIAGNOSIS — C01 Malignant neoplasm of base of tongue: Secondary | ICD-10-CM

## 2015-07-05 DIAGNOSIS — E43 Unspecified severe protein-calorie malnutrition: Secondary | ICD-10-CM | POA: Insufficient documentation

## 2015-07-05 HISTORY — DX: Unspecified severe protein-calorie malnutrition: E43

## 2015-07-05 MED ORDER — ACETAMINOPHEN 10 MG/ML IV SOLN
1000.0000 mg | Freq: Once | INTRAVENOUS | Status: AC
  Administered 2015-07-05: 12:00:00 1000 mg via INTRAVENOUS
  Filled 2015-07-05: qty 100

## 2015-07-05 MED ORDER — PHENOL 1.4 % MT LIQD
1.0000 | OROMUCOSAL | Status: DC | PRN
Start: 2015-07-05 — End: 2015-07-07
  Administered 2015-07-06 (×3): 1 via OROMUCOSAL
  Filled 2015-07-05 (×3): qty 177

## 2015-07-05 MED ORDER — ENALAPRILAT 1.25 MG/ML IV SOLN
1.2500 mg | Freq: Four times a day (QID) | INTRAVENOUS | Status: DC
  Administered 2015-07-05 – 2015-07-07 (×7): 1.25 mg via INTRAVENOUS
  Filled 2015-07-05 (×12): qty 1

## 2015-07-05 MED ORDER — FENTANYL 50 MCG/HR TD PT72
50.0000 ug | MEDICATED_PATCH | TRANSDERMAL | Status: DC
  Administered 2015-07-05: 10:00:00 50 ug via TRANSDERMAL
  Filled 2015-07-05: qty 1

## 2015-07-05 NOTE — Plan of Care (Signed)
Problem: Discharge Progression Outcomes Goal: Other Discharge Outcomes/Goals Plan of care progress to goal: - Pain control via PRN pain medication. - Tylenol IV given for headache with improvement. - Assist with urinal. - Continues IV fluids. Will continue to monitor.

## 2015-07-05 NOTE — Care Management Important Message (Signed)
Important Message  Patient Details  Name: Grant Lee MRN: [id number] Date of Birth: Sep 28, 1956   Medicare Important Message Given:  Yes-second notification given    [name] 07/05/2015, 10:46 AM

## 2015-07-05 NOTE — Consult Note (Signed)
ONCOLOGY CONSULTATION NOTE  -  Reason for Consultation: Recently diagnosed recurrent head and neck cancer, admitted with unintentional weight loss, inability to eat/drink, dehydration.   History of Present Illness:  Patient is a 59 year old gentleman with past medical history as stated below, patient has a history of right-sided tongue cancer in 2006. States that he did continue intermittently smoking up until December 2015 and has quit since then. More recently he was found to have positive left tongue base lesion and underwent biopsy on 06/26/2015 which is positive for invasive squamous cell carcinoma. Patient states that he has been trying to eat better but has had 30-40 pound unintentional weight loss. He has become extremely weak and intermittently lightheaded on getting up and ambulating. Denies any falls, loss of consciousness or headaches. No bleeding symptoms. He has been referred to emergency room by her ENT doctor Tami Ribas and has been hospitalized for IV hydration and is planned for GI consultation to consider PEG tube placement. Currently this finishes. Denies fever or chills. No vomiting or diarrhea.   Past Medical History  Diagnosis Date  . Hypertension   . Pneumonia     2004  . GERD (gastroesophageal reflux disease)   . Arthritis   . Cancer   . Anemia     blood transfusion 2012  . Bleeding ulcer   . Machinery accident     LEG INJURY    Patient Active Problem List   Diagnosis Date Noted  . Malnutrition 07/03/2015  . Aspiration pneumonitis 07/03/2015  . DU (duodenal ulcer) 06/11/2015  . Tongue cancer 06/11/2015  . Abnormal loss of weight 10/09/2014  . Can't get food down 10/09/2014    Past Surgical History  Procedure Laterality Date  . Leg surgery    . Joint replacement      right hip  . Tongue biopsy    . Tongue surgery      multiple surgeries  . Direct laryngoscopy N/A  06/26/2015    Procedure: Laryngoscopy with tongue base biopsy ; Surgeon: Beverly Gust, MD; Location: [facility] ORS; Service: ENT; Laterality: N/A;    Past Surgical History  Procedure Laterality Date  . Leg surgery    . Joint replacement      right hip  . Tongue biopsy    . Tongue surgery      multiple surgeries  . Direct laryngoscopy N/A 06/26/2015    Procedure: Laryngoscopy with tongue base biopsy ; Surgeon: Beverly Gust, MD; Location: [facility] ORS; Service: ENT; Laterality: N/A;    No current outpatient prescriptions on file.  Allergies:  Review of patient's allergies indicates no known allergies.  Family History: History reviewed. No pertinent family history.  Social History: Social History  Substance Use Topics  . Smoking status: Former Smoker -- 1.00 packs/day for 0 years    Types: Cigarettes    Quit date: 09/21/2014  . Smokeless tobacco: None  . Alcohol Use: Yes     Comment: beer/wine every day     Review of Systems: Constitutional: As in history of present illness above. No fever or chills.   HEENT: No headaches, epistaxis, ear or jaw pain. Cardiac: No chest pain Respiratory: No shortness of breath, wheezing, or stridor Abdomen: No abdominal pain, no vomiting, No diarrhea Endocrine: Weight loss. No polyuria polydipsia Extremities: No new swelling or pain Skin: No rashes, easy bruising Urologic: No dysuria, Hematuria. Neurologic: No focal weakness or seizures  Physical Exam -   Vitals - 98, 84, 19, 1 4276, 95% on room air General: Patient is weak, poorly nourished thin individual, resting in bed, otherwise alert and oriented in no acute distress. No icterus.  HEENT - extraocular movements intact. No oral thrush, mouth is dry. Thickening left side of his tongue extending posteriorly. No stridor. CVS: S1 and S2,  regular Lungs: Clear to ascultation without wheezes , rhonchi, or rales Abdomen: Soft, nontender. No hepatomegaly .  Extremities:  No edema, clubbing or cyanosis Neurologic: Cranial nerves intact, moves all extremities spontaneously, gait not checked. Skin: no rashes   Labs:    COMPREHENSIVE METABOLIC PANEL - Abnormal; Notable for the following:    BUN <5 (*)    Calcium 8.3 (*)    Total Protein 6.0 (*)    Albumin 2.6 (*)    ALT 8 (*)    Total Bilirubin 0.1 (*)    All other components within normal limits  CBC WITH DIFFERENTIAL/PLATELET - Abnormal; Notable for the following:    RBC 3.54 (*)    Hemoglobin 11.9 (*)    HCT 35.8 (*)    MCV 101.0 (*)    Neutro Abs 7.6 (*)    All other components within normal limits       06/26/15 - Pathology Report: DIAGNOSIS:  A. TONGUE, LEFT BASE; BIOPSY:  INVASIVE SQUAMOUS CELL CARCINOMA, KERATINIZING TYPE. ULCERATION AND NECROSIS.  B. TONGUE, MIDDLE BASE: BIOPSY: NEGATIVE FOR DYSPLASIA AND MALIGNANCY.  C. TONGUE, RIGHT BASE; BIOPSY: NEGATIVE FOR DYSPLASIA AND MALIGNANCY.   IMPRESSION / RECOMMENDATIONS: 59 year old gentleman with history of second primary tongue cancer.  T3 N0 M0 tumor Possible chemotherapy with concurrent radiation therapy has been planned.  Patient has mild nutrition and severe dysphagia secondary to malignancy Proceed with break tube placement tomorrow.  Discussed situation with ENT surgeon. We will hold Lovenox tonight for surgery tomorrow morning The patient has not been seen by any general surgeon and port placement has not been planned then vascular surgeon consultation has been ordered for port placement. The patient and can be discharged after patient is started PEG tube feeding to be started for chemoradiation therapy next week. Case was discussed in tumor conference today

## 2015-07-05 NOTE — Progress Notes (Signed)
Nutrition Follow-up  DOCUMENTATION CODES:   Severe malnutrition in context of chronic illness  INTERVENTION:   EN: Per MD note, pt to get PEG placement tomorrow. Once MD approval to initiate enteral feedings, will recommend Jevity 1.5 at 6 cans per day, recommending one and a half cans QID at goal. Will recommend starting at half of can (165m) QID and titrating by half of can (1245m until goal rate met, pending labs as pt is at high risk for refeeding syndrome. 6 cans of Jevity 1.5 will provide 2133kcals and 91g protein. Will recommend free water flushes of 5061mefore and after each bolus with total free water providing  1481m44m fee water. Will recommend additional flushes once pt off IVF. Spoke with MD, PatePosey Prontoeeable to checking Mg and P tomorrow am with lab work. Coordination of Care: will request new weight again tomorrow am.   NUTRITION DIAGNOSIS:   Malnutrition related to cancer and cancer related treatments, dysphagia as evidenced by percent weight loss, energy intake < 75% for > or equal to 1 month, severe depletion of body fat, severe depletion of muscle mass; to be addressed with placement of PEG tube.  GOAL:   Patient will meet greater than or equal to 90% of their needs; ongoing  MONITOR:    (Energy Intake, Anthropometrics, digestive system, Electrolyte and Renal Profile)   ASSESSMENT:   Per MD note, pt to have PEG placed Friday 07/06/2015.  Diet Order:  Diet NPO time specified    Current Nutrition: Pt remains NPO   Gastrointestinal Profile: Last BM: 07/03/2015   Medications: D5 0.45%NS at 100mL62m(providing 408kcals in 24 hours)  Electrolyte/Renal Profile and Glucose Profile:   Recent Labs Lab 07/03/15 1104 07/04/15 0539  NA 136 137  K 4.0 3.6  CL 102 106  CO2 27 27  BUN <5* <5*  CREATININE 0.73 0.59*  CALCIUM 8.3* 8.1*  GLUCOSE 82 124*   Protein Profile:   Recent Labs Lab 07/03/15 1104  ALBUMIN 2.6*     Weight Trend since  Admission: Filed Weights   07/03/15 0953  Weight: 143 lb (64.864 kg)    Ideal Body Weight:  86 kg  BMI:  Body mass index is 18.35 kg/(m^2).   Estimated Nutritional Needs:   Kcal:  BEE: 1744kcals, TEE: (IF 1.1-1.3)(AF 1.2) 2296-2713kcals, using IBW of 86kg  Protein:  86-103g of protein (1.0-1.2g/kg) using IBW of 86kg  Fluid:  2150-2580mL 9mluid (25-30mL/k58msing IBW of 86kg   EDUCATION NEEDS:   Education needs no appropriate at this time    HIGH Ca[place]DN Pager [phone]

## 2015-07-05 NOTE — Progress Notes (Signed)
[place] at [place] NAME: Grant Lee    MR#:  [id number]  DATE OF BIRTH:  [birth year]  SUBJECTIVE:  Came in with poor po intake due to dysphagia. Dry oral mucosa C/o headache  REVIEW OF SYSTEMS:   Review of Systems  Constitutional: Positive for malaise/fatigue. Negative for fever, chills and weight loss.  HENT: Negative for ear discharge, ear pain and nosebleeds.   Eyes: Negative for blurred vision, pain and discharge.  Respiratory: Negative for sputum production, shortness of breath, wheezing and stridor.   Cardiovascular: Negative for chest pain, palpitations, orthopnea and PND.  Gastrointestinal: Positive for nausea. Negative for vomiting, abdominal pain and diarrhea.  Genitourinary: Negative for urgency and frequency.  Musculoskeletal: Negative for back pain and joint pain.  Neurological: Positive for weakness and headaches. Negative for sensory change, speech change and focal weakness.  Psychiatric/Behavioral: Negative for depression and hallucinations. The patient is not nervous/anxious.   All other systems reviewed and are negative.  Tolerating Diet:no  DRUG ALLERGIES:  No Known Allergies  VITALS:  Blood pressure 167/98, pulse 66, temperature 98.2 F (36.8 C), temperature source Oral, resp. rate 18, height '6\' 2"'$  (1.88 m), weight 64.864 kg (143 lb), SpO2 98 %.  PHYSICAL EXAMINATION:   Physical Exam  GENERAL:  59 y.o.-year-old patient lying in the bed with no acute distress. Cachectic, thin EYES: Pupils equal, round, reactive to light and accommodation. No scleral icterus. Extraocular muscles intact.  HEENT: Head atraumatic, normocephalic. Oropharynx and nasopharynx clear. Oral chronic changes due to cancer/radiation NECK:  Supple, no jugular venous distention. No thyroid enlargement, no tenderness.  LUNGS: Normal breath sounds bilaterally, no wheezing, rales, rhonchi. No use of accessory muscles of respiration.   CARDIOVASCULAR: S1, S2 normal. No murmurs, rubs, or gallops.  ABDOMEN: Soft, nontender, nondistended. Bowel sounds present. No organomegaly or mass.  EXTREMITIES: No cyanosis, clubbing or edema b/l.    NEUROLOGIC: Cranial nerves II through XII are intact. No focal Motor or sensory deficits b/l.   PSYCHIATRIC: The patient is alert and oriented x 3.  SKIN: No obvious rash, lesion, or ulcer.    LABORATORY PANEL:   CBC  Recent Labs Lab 07/04/15 0539  WBC 10.6  HGB 11.7*  HCT 35.0*  PLT 295    Chemistries   Recent Labs Lab 07/03/15 1104 07/04/15 0539  NA 136 137  K 4.0 3.6  CL 102 106  CO2 27 27  GLUCOSE 82 124*  BUN <5* <5*  CREATININE 0.73 0.59*  CALCIUM 8.3* 8.1*  AST 23  --   ALT 8*  --   ALKPHOS 81  --   BILITOT 0.1*  --     Cardiac Enzymes No results for input(s): TROPONINI in the last 168 hours.  RADIOLOGY:  Dg Chest 2 View  07/03/2015   CLINICAL DATA:  Cough, history of tongue cancer  EXAM: CHEST  2 VIEW  COMPARISON:  01/24/2015  FINDINGS: Cardiomediastinal silhouette is stable. Hyperinflation again noted. Acute infiltrate or pulmonary edema. Stable chronic interstitial prominence. Old left rib fracture again noted.  IMPRESSION: No active disease. Hyperinflation again noted. Stable chronic interstitial prominence.   Electronically Signed   By: Lahoma Crocker M.D.   On: 07/03/2015 14:13     ASSESSMENT AND PLAN:  59 y.o. male with a known history of right tongue cancer status post operation in 2006, who presents to the hospital with complaints of difficulty swallowing, pain in her throat area on the left and significant  weight loss  1. Severe protein calorie malnutrition due to odynophagia, dysphagia due to tongue cancer -seen by Oncology and GI and PEG placement is recommended (on Friday)0 -pt taking ice for pleasure due to severe dry oral mucosa. -seen by Speech therapist   2. Dysphagia, odynophagia, as above. Medications will be continued as needed. Speech  therapist evaluation  Noted and pt underwent modified swallowing study -shows s/o aspiration No cxr findings of aspiration -d/c iv zosyn  3. 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 Anemia, guaiac follow with hydration and transfuse patient as needed  5. Oral pain with headache Prn dilaudid IV, fentanyl patch 50 mcg and IV tylenol x1 for headache  Case discussed with Care Management/Social Worker. Management plans discussed with the patient, family and they are in agreement.  CODE STATUS: full  DVT Prophylaxis: lovenox  TOTAL TIME TAKING CARE OF THIS PATIENT: 35 minutes.  >50% time spent on counselling and coordination of care pt and RN  POSSIBLE D/C IN 1-2 DAYS, DEPENDING ON CLINICAL CONDITION.   Karlye Ihrig M.D on 07/05/2015 at 12:50 PM  Between 7am to 6pm - Pager - [phone]  After 6pm go to [email or web address] - password [email or web address] [facility]  Office  [phone]  CC: Primary care physician; Cyndi Bender, [name]

## 2015-07-05 NOTE — Progress Notes (Addendum)
Notified Dr Posey Pronto that pt c/o severe headache. Pt verbalized that tylenol IV helped last night. MD verbalized to order Tylenol IV.

## 2015-07-05 NOTE — Care Management (Signed)
Patient presents with malnutrition.  Plan for PEG tube placement tomorrow.  Patient lives at home with his wife.  Patient obtains his medication at [facility].  Patient states that he is able to obtain all of his medications, and has no financial concerns.  Patient does not require home O2, and has no home equipment.  RNCM to follow for discharge planning.

## 2015-07-05 NOTE — Consult Note (Signed)
Subjective: Patient seen for  Dysphagia, malnutrition, need for PEG.  Stable, denies abdominalpain or nausea.  Results of MBSS discussed with SLP and patient.   Objective: Vital signs in last 24 hours: Temp:  [97.9 F (36.6 C)-98.3 F (36.8 C)] 98.2 F (36.8 C) (08/25 0537) Pulse Rate:  [59-70] 66 (08/25 0833) Resp:  [18] 18 (08/25 0537) BP: (157-183)/(92-116) 183/116 mmHg (08/25 1448) SpO2:  [98 %-99 %] 98 % (08/25 0537) Blood pressure 183/116, pulse 66, temperature 98.2 F (36.8 C), temperature source Oral, resp. rate 18, height '6\' 2"'$  (1.88 m), weight 64.864 kg (143 lb), SpO2 98 %.   Intake/Output from previous day: 08/24 0701 - 08/25 0700 In: 2979.2 [I.V.:2979.2] Out: 2075 [Urine:2075]  Intake/Output this shift: Total I/O In: -  Out: 650 [Urine:650]   General appearance:  Thin no acute distress Resp:  cta Cardio:  rrr GI:  Soft, nt/nd, bowel sounds positive  Extremities:  No cce   Lab Results: No results found for this or any previous visit (from the past 24 hour(s)).    Recent Labs  07/03/15 1104 07/04/15 0539  WBC 9.7 10.6  HGB 11.9* 11.7*  HCT 35.8* 35.0*  PLT 327 295   BMET  Recent Labs  07/03/15 1104 07/04/15 0539  NA 136 137  K 4.0 3.6  CL 102 106  CO2 27 27  GLUCOSE 82 124*  BUN <5* <5*  CREATININE 0.73 0.59*  CALCIUM 8.3* 8.1*   LFT  Recent Labs  07/03/15 1104  PROT 6.0*  ALBUMIN 2.6*  AST 23  ALT 8*  ALKPHOS 81  BILITOT 0.1*   PT/INR  Recent Labs  07/04/15 0539  LABPROT 14.9  INR 1.15   Hepatitis Panel No results for input(s): HEPBSAG, HCVAB, HEPAIGM, HEPBIGM in the last 72 hours. C-Diff No results for input(s): CDIFFTOX in the last 72 hours. No results for input(s): CDIFFPCR in the last 72 hours.   Studies/Results: No results found.  Scheduled Inpatient Medications:   . antiseptic oral rinse  7 mL Mouth Rinse q12n4p  . budesonide-formoterol  2 puff Inhalation BID  . chlorhexidine  15 mL Mouth Rinse BID  .  enalaprilat  1.25 mg Intravenous 4 times per day  . enoxaparin (LOVENOX) injection  40 mg Subcutaneous Q24H  . fentaNYL  50 mcg Transdermal Q72H    Continuous Inpatient Infusions:   . dextrose 5 % and 0.45% NaCl 100 mL/hr at 07/05/15 1156    PRN Inpatient Medications:  HYDROmorphone (DILAUDID) injection, ipratropium-albuterol, phenol  Miscellaneous:   Assessment:  1) dysphagia, malnutritiion due to recurrent tongue ca.   Plan:  1) EGD/PEG tomorrow.  I have discussed the risks benefits and complications of procedures to include not limited to bleeding, infection, perforation and the risk of sedation and the patient wishes to proceed.  Lollie Sails MD 07/05/2015, 3:26 PM

## 2015-07-06 ENCOUNTER — Inpatient Hospital Stay: Payer: Medicare Other | Admitting: Anesthesiology

## 2015-07-06 ENCOUNTER — Encounter
Admission: EM | Disposition: A | Discharge status: Home / Self Care | Payer: Self-pay | Source: Home / Self Care | Attending: Internal Medicine

## 2015-07-06 ENCOUNTER — Telehealth: Payer: Self-pay | Admitting: *Deleted

## 2015-07-06 ENCOUNTER — Encounter: Payer: Self-pay | Admitting: *Deleted

## 2015-07-06 DIAGNOSIS — E860 Dehydration: Secondary | ICD-10-CM

## 2015-07-06 DIAGNOSIS — R4702 Dysphasia: Secondary | ICD-10-CM

## 2015-07-06 DIAGNOSIS — C029 Malignant neoplasm of tongue, unspecified: Secondary | ICD-10-CM | POA: Insufficient documentation

## 2015-07-06 DIAGNOSIS — R634 Abnormal weight loss: Secondary | ICD-10-CM

## 2015-07-06 HISTORY — PX: ESOPHAGOGASTRODUODENOSCOPY: SHX5428

## 2015-07-06 LAB — PHOSPHORUS: PHOSPHORUS: 3.9 mg/dL (ref 2.5–4.6)

## 2015-07-06 LAB — MAGNESIUM: Magnesium: 1.5 mg/dL — ABNORMAL LOW (ref 1.7–2.4)

## 2015-07-06 SURGERY — EGD (ESOPHAGOGASTRODUODENOSCOPY)
Anesthesia: General

## 2015-07-06 SURGERY — PORTA CATH INSERTION
Anesthesia: Moderate Sedation

## 2015-07-06 MED ORDER — PROPOFOL 10 MG/ML IV BOLUS
INTRAVENOUS | Status: DC | PRN
  Administered 2015-07-06: 80 mg via INTRAVENOUS
  Administered 2015-07-06: 50 mg via INTRAVENOUS

## 2015-07-06 MED ORDER — FENTANYL CITRATE (PF) 100 MCG/2ML IJ SOLN
INTRAMUSCULAR | Status: DC | PRN
  Administered 2015-07-06: 50 ug via INTRAVENOUS

## 2015-07-06 MED ORDER — MIDAZOLAM HCL 2 MG/2ML IJ SOLN
INTRAMUSCULAR | Status: DC | PRN
  Administered 2015-07-06: 1 mg via INTRAVENOUS

## 2015-07-06 MED ORDER — CEFAZOLIN SODIUM 1-5 GM-% IV SOLN
1.0000 g | Freq: Once | INTRAVENOUS | Status: AC
  Administered 2015-07-06: 1 g via INTRAVENOUS
  Filled 2015-07-06: qty 50

## 2015-07-06 MED ORDER — MAGNESIUM SULFATE 2 GM/50ML IV SOLN
2.0000 g | Freq: Once | INTRAVENOUS | Status: AC
  Administered 2015-07-06: 11:00:00 2 g via INTRAVENOUS
  Filled 2015-07-06: qty 50

## 2015-07-06 MED ORDER — PHENYLEPHRINE HCL 10 MG/ML IJ SOLN
INTRAMUSCULAR | Status: DC | PRN
  Administered 2015-07-06 (×2): 100 ug via INTRAVENOUS
  Administered 2015-07-06: 50 ug via INTRAVENOUS

## 2015-07-06 MED ORDER — LABETALOL HCL 5 MG/ML IV SOLN
INTRAVENOUS | Status: DC | PRN
  Administered 2015-07-06 (×2): 5 mg via INTRAVENOUS

## 2015-07-06 MED ORDER — KETAMINE HCL 50 MG/ML IJ SOLN
INTRAMUSCULAR | Status: DC | PRN
  Administered 2015-07-06: 25 mg via INTRAMUSCULAR

## 2015-07-06 MED ORDER — PROPOFOL INFUSION 10 MG/ML OPTIME
INTRAVENOUS | Status: DC | PRN
  Administered 2015-07-06: 160 ug/kg/min via INTRAVENOUS

## 2015-07-06 MED ORDER — LIDOCAINE HCL (CARDIAC) 20 MG/ML IV SOLN
INTRAVENOUS | Status: DC | PRN
  Administered 2015-07-06: 40 mg via INTRAVENOUS

## 2015-07-06 MED ORDER — SODIUM CHLORIDE 0.9 % IV SOLN
INTRAVENOUS | Status: DC
Start: 2015-07-06 — End: 2015-07-06
  Administered 2015-07-06: 13:00:00 via INTRAVENOUS

## 2015-07-06 NOTE — Transfer of Care (Signed)
Immediate Anesthesia Transfer of Care Note  Patient: Grant Lee  Procedure(s) Performed: Procedure(s) with comments: ESOPHAGOGASTRODUODENOSCOPY (EGD) and percutaneous gastrostomy tyube placement (N/A) - Residual need to be done in the afternoon on 07/06/2015. Combined procedure with Dr. Gustavo Lah and Dr Rayann Heman  Patient Location: PACU  Anesthesia Type:General  Level of Consciousness: sedated  Airway & Oxygen Therapy: Patient Spontanous Breathing and Patient connected to nasal cannula oxygen  Post-op Assessment: Report given to RN  Post vital signs: Reviewed and stable  Last Vitals:  Filed Vitals:   07/06/15 1307  BP: 162/107  Pulse: 76  Temp: 36.4 C  Resp: 20    Complications: No apparent anesthesia complications

## 2015-07-06 NOTE — Progress Notes (Signed)
Notified Dr patel that Magnesium 1.5. MD verbalized to order 2grams Magnesium IV once.

## 2015-07-06 NOTE — Op Note (Addendum)
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 Rayann Heman, MD Referring MD:      Galen Daft. Deforest Hoyles, MD (Referring MD) Medicines:         Monitored Anesthesia Care Complications:     No immediate complications. Procedure:         Pre-Anesthesia Assessment:                    - ASA Grade Assessment: III - A patient with severe                     systemic disease.                    After obtaining informed consent, the endoscope was passed                     under direct vision. Throughout the procedure, the                     patient's blood pressure, pulse, and oxygen saturations                     were monitored continuously. The Endoscope was introduced                     through the mouth, and advanced to the third part of                     duodenum. The upper GI endoscopy was accomplished without                     difficulty. The patient tolerated the procedure well. Findings:      radiation changes are noted in the posterior pharynx and eschgar in the       left pharynx.      The exam of the esophagus was otherwise normal.      Patchy mild inflammation characterized by congestion (edema), erythema       and friability was found in the gastric body, in the prepyloric region       of the stomach and at the pylorus.      The examined duodenum was normal.      The cardia and gastric fundus were normal on retroflexion.      Placement of a direct stick  PEG with 4 T-fasteners was successfully       completed. The external bumper was at the 2.0 cm marking on the tube. Impression:        - Gastritis.                    - Normal  examined duodenum.                    - A direct stick PEG placement was successfully completed.                    - No specimens collected. Recommendation:    - Refer to a dietitian                    - do not use peg until ok by MD Lollie Sails, MD 07/06/2015 2:53:33 PM This report has been signed electronically. Mellody Life, MD Number of Addenda: 0 Note Initiated On: 07/06/2015 12:49 PM      [facility]

## 2015-07-06 NOTE — Progress Notes (Signed)
   07/06/15 1000  Clinical Encounter Type  Visited With Patient and family together  Visit Type Initial  Provided pastoral support and presence to patient and his spouse during visit.  [place] [phone]

## 2015-07-06 NOTE — H&P (Addendum)
Subjective: Patient seen for malnutrition, dysphagia. She denies any abdominal pain or nausea.  Objective: Vital signs in last 24 hours: Temp:  [97.8 F (36.6 C)-98.5 F (36.9 C)] 97.8 F (36.6 C) (08/26 0820) Pulse Rate:  [71-80] 79 (08/26 0820) Resp:  [18] 18 (08/26 0820) BP: (166-183)/(94-116) 173/98 mmHg (08/26 0910) SpO2:  [97 %-99 %] 98 % (08/26 0507) Weight:  [61.054 kg (134 lb 9.6 oz)-61.689 kg (136 lb)] 61.054 kg (134 lb 9.6 oz) (08/26 0507) Blood pressure 173/98, pulse 79, temperature 97.8 F (36.6 C), temperature source Oral, resp. rate 18, height '6\' 2"'$  (1.88 m), weight 61.054 kg (134 lb 9.6 oz), SpO2 98 %.   Intake/Output from previous day: 08/25 0701 - 08/26 0700 In: -  Out: 2500 [Urine:2500]  Intake/Output this shift:     General appearance:  Thin 59 year old male no acute distress Resp:  Laterally clear to auscultation Cardio:  Regular rate and rhythm GI:  Soft nontender nondistended bowel sounds positive normoactive Extremities:  No clubbing cyanosis or edema   Lab Results: Results for orders placed or performed during the hospital encounter of 07/03/15 (from the past 24 hour(s))  Magnesium     Status: Abnormal   Collection Time: 07/06/15  7:51 AM  Result Value Ref Range   Magnesium 1.5 (L) 1.7 - 2.4 mg/dL  Phosphorus     Status: None   Collection Time: 07/06/15  7:51 AM  Result Value Ref Range   Phosphorus 3.9 2.5 - 4.6 mg/dL      Recent Labs  07/04/15 0539  WBC 10.6  HGB 11.7*  HCT 35.0*  PLT 295   BMET  Recent Labs  07/04/15 0539  NA 137  K 3.6  CL 106  CO2 27  GLUCOSE 124*  BUN <5*  CREATININE 0.59*  CALCIUM 8.1*   LFT No results for input(s): PROT, ALBUMIN, AST, ALT, ALKPHOS, BILITOT, BILIDIR, IBILI in the last 72 hours. PT/INR  Recent Labs  07/04/15 0539  LABPROT 14.9  INR 1.15   Hepatitis Panel No results for input(s): HEPBSAG, HCVAB, HEPAIGM, HEPBIGM in the last 72 hours. C-Diff No results for input(s): CDIFFTOX  in the last 72 hours. No results for input(s): CDIFFPCR in the last 72 hours.   Studies/Results: No results found.  Scheduled Inpatient Medications:   . [facility] Hold] antiseptic oral rinse  7 mL Mouth Rinse q12n4p  . [MAR Hold] budesonide-formoterol  2 puff Inhalation BID  . [MAR Hold] chlorhexidine  15 mL Mouth Rinse BID  . [MAR Hold] enalaprilat  1.25 mg Intravenous 4 times per day  . [MAR Hold] fentaNYL  50 mcg Transdermal Q72H    Continuous Inpatient Infusions:   . sodium chloride 75 mL/hr at 07/06/15 1232  . dextrose 5 % and 0.45% NaCl 100 mL/hr at 07/06/15 1012    PRN Inpatient Medications:  [MAR Hold]  HYDROmorphone (DILAUDID) injection, [MAR Hold] ipratropium-albuterol, [MAR Hold] phenol  Miscellaneous:   Assessment:  1. malnutrition and dysphagia in the setting of recurrent tongue cancer.  Plan:  EGD and PEG placement. Bind procedure with Dr. Rayann Heman.  I have discussed the risks benefits and complications of procedures to include not limited to bleeding, infection, perforation and the risk of sedation and the patient wishes to proceed.  Lollie Sails MD 07/06/2015, 1:07 PM

## 2015-07-06 NOTE — Progress Notes (Signed)
[place] at [place] NAME: Grant Lee    MR#:  [id number]  DATE OF BIRTH:  1956-02-03  SUBJECTIVE:  Came in with poor po intake due to dysphagia. Dry oral mucosa Feels some better today Family in the room  REVIEW OF SYSTEMS:   Review of Systems  Constitutional: Positive for malaise/fatigue. Negative for fever, chills and weight loss.  HENT: Negative for ear discharge, ear pain and nosebleeds.   Eyes: Negative for blurred vision, pain and discharge.  Respiratory: Negative for sputum production, shortness of breath, wheezing and stridor.   Cardiovascular: Negative for chest pain, palpitations, orthopnea and PND.  Gastrointestinal: Positive for nausea. Negative for vomiting, abdominal pain and diarrhea.  Genitourinary: Negative for urgency and frequency.  Musculoskeletal: Negative for back pain and joint pain.  Neurological: Positive for weakness and headaches. Negative for sensory change, speech change and focal weakness.  Psychiatric/Behavioral: Negative for depression and hallucinations. The patient is not nervous/anxious.   All other systems reviewed and are negative.  Tolerating Diet:no  DRUG ALLERGIES:  No Known Allergies  VITALS:  Blood pressure 173/98, pulse 79, temperature 97.8 F (36.6 C), temperature source Oral, resp. rate 18, height '6\' 2"'$  (1.88 m), weight 61.054 kg (134 lb 9.6 oz), SpO2 98 %.  PHYSICAL EXAMINATION:   Physical Exam  GENERAL:  59 y.o.-year-old patient lying in the bed with no acute distress. Cachectic, thin EYES: Pupils equal, round, reactive to light and accommodation. No scleral icterus. Extraocular muscles intact.  HEENT: Head atraumatic, normocephalic. Oropharynx and nasopharynx clear. Oral chronic changes due to cancer/radiation NECK:  Supple, no jugular venous distention. No thyroid enlargement, no tenderness.  LUNGS: Normal breath sounds bilaterally, no wheezing, rales, rhonchi. No  use of accessory muscles of respiration.  CARDIOVASCULAR: S1, S2 normal. No murmurs, rubs, or gallops.  ABDOMEN: Soft, nontender, nondistended. Bowel sounds present. No organomegaly or mass.  EXTREMITIES: No cyanosis, clubbing or edema b/l.    NEUROLOGIC: Cranial nerves II through XII are intact. No focal Motor or sensory deficits b/l.   PSYCHIATRIC: The patient is alert and oriented x 3.  SKIN: No obvious rash, lesion, or ulcer.    LABORATORY PANEL:   CBC  Recent Labs Lab 07/04/15 0539  WBC 10.6  HGB 11.7*  HCT 35.0*  PLT 295    Chemistries   Recent Labs Lab 07/03/15 1104 07/04/15 0539 07/06/15 0751  NA 136 137  --   K 4.0 3.6  --   CL 102 106  --   CO2 27 27  --   GLUCOSE 82 124*  --   BUN <5* <5*  --   CREATININE 0.73 0.59*  --   CALCIUM 8.3* 8.1*  --   MG  --   --  1.5*  AST 23  --   --   ALT 8*  --   --   ALKPHOS 81  --   --   BILITOT 0.1*  --   --     Cardiac Enzymes No results for input(s): TROPONINI in the last 168 hours.  RADIOLOGY:  No results found.   ASSESSMENT AND PLAN:  59 y.o. male with a known history of right tongue cancer status post operation in 2006, who presents to the hospital with complaints of difficulty swallowing, pain in her throat area on the left and significant weight loss  1. Severe protein calorie malnutrition due to odynophagia, dysphagia due to tongue cancer -seen by Oncology and GI and  PEG placement is recommended (on Friday) -pt taking ice for pleasure due to severe dry oral mucosa. -seen by Speech therapist   2. Dysphagia, odynophagia, as above. Medications will be continued as needed. Speech therapist evaluation  Noted and pt underwent modified swallowing study -shows s/o aspiration No cxr findings of aspiration -d/c iv zosyn  3. 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 Anemia, guaiac follow with hydration and transfuse patient as needed  5. Oral pain with  headache Prn dilaudid IV, fentanyl patch 50 mcg and IV tylenol x1 for headache  Case discussed with Care Management/Social Worker. Management plans discussed with the patient, family and they are in agreement.  CODE STATUS: full  DVT Prophylaxis: lovenox  TOTAL TIME TAKING CARE OF THIS PATIENT: 35 minutes.  >50% time spent on counselling and coordination of care pt and RN  POSSIBLE D/C IN 1-2 DAYS, DEPENDING ON CLINICAL CONDITION.   Caralee Morea M.D on 07/06/2015 at 11:19 AM  Between 7am to 6pm - Pager - [phone]  After 6pm go to [email or web address] - password [email or web address] [place] [facility]  Office  [phone]  CC: Primary care physician; Cyndi Bender, [name]

## 2015-07-06 NOTE — Progress Notes (Signed)
Dr Posey Pronto verbalized to order PT consult.

## 2015-07-06 NOTE — Evaluation (Signed)
Physical Therapy Evaluation Patient Details Name: Grant Lee MRN: [id number] DOB: 1956-03-09 Today's Date: 07/06/2015   History of Present Illness  DAUNDRE BIEL is a 59 y.o. 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 He presents to hospital with difficulty swallowing. He has been evaluated by Speech for modified swallow test and gastroenterology. Patient is scheduled for PEG tube placement today.   Clinical Impression  59 yo Male with history of tongue cancer, presents to therapy with difficulty swallowing. Patient reports being independent in all self care ADLs prior to admittance. Currently he is modified independent in bed mobility, supervision for transfers and CGA with gait tasks using SPC. Patient ambulates with unsteady gait pattern, narrow base of support and staggered gait. He would benefit from additional skilled PT intervention to improve gait safety and reduce loss of balance. PT recommends home health PT upon discharge. Patient is thinking about it. He would benefit from additional PT to reduce fall risk and improve gait safety.    Follow Up Recommendations Home health PT    Equipment Recommendations  None recommended by PT    Recommendations for Other Services       Precautions / Restrictions Precautions Precautions: Fall Restrictions Weight Bearing Restrictions: No      Mobility  Bed Mobility Overal bed mobility: Independent             General bed mobility comments: uses rails  Transfers Overall transfer level: Needs assistance Equipment used: Straight cane Transfers: Sit to/from Stand Sit to Stand: Supervision         General transfer comment: with cues for hand placement for safety  Ambulation/Gait Ambulation/Gait assistance: Min guard Ambulation Distance (Feet): 160 Feet Assistive device: Straight cane Gait Pattern/deviations: Step-through pattern;Decreased step length -  right;Decreased step length - left     General Gait Details: decreased gait speed, narrow base of support and decreased foot clearance with shuffled gait  Stairs            Wheelchair Mobility    Modified Rankin (Stroke Patients Only)       Balance Overall balance assessment: Needs assistance Sitting-balance support: No upper extremity supported Sitting balance-Leahy Scale: Good     Standing balance support: Single extremity supported Standing balance-Leahy Scale: Fair Standing balance comment: requires assistive device for gait safety and CGA                             Pertinent Vitals/Pain Pain Assessment: No/denies pain    Home Living Family/patient expects to be discharged to:: Private residence Living Arrangements: Spouse/significant other Available Help at Discharge: Family Type of Home: House Home Access: Stairs to enter Entrance Stairs-Rails: Right;Left;Can reach both Entrance Stairs-Number of Steps: 3-6 Home Layout: One level Home Equipment: Walker - 2 wheels;Cane - single point;Shower seat;Bedside commode      Prior Function Level of Independence: Independent with assistive device(s)         Comments: reports being able to walk with SPC independently; independent in self care ADLs     Hand Dominance        Extremity/Trunk Assessment   Upper Extremity Assessment: Overall WFL for tasks assessed           Lower Extremity Assessment: Overall WFL for tasks assessed;Generalized weakness (BLE grossly 4/5 except RLE: knee 4-/5)      Cervical / Trunk Assessment: Kyphotic  Communication   Communication: No difficulties  Cognition Arousal/Alertness: Awake/alert Behavior During Therapy: WFL for tasks assessed/performed Overall Cognitive Status: Within Functional Limits for tasks assessed                      General Comments      Exercises        Assessment/Plan    PT Assessment Patient needs continued PT  services  PT Diagnosis Difficulty walking;Generalized weakness   PT Problem List Decreased strength;Decreased activity tolerance;Decreased balance;Decreased mobility  PT Treatment Interventions Gait training;Stair training;Functional mobility training;Therapeutic activities;Therapeutic exercise;Balance training   PT Goals (Current goals can be found in the Care Plan section) Acute Rehab PT Goals Patient Stated Goal: to go home PT Goal Formulation: With patient Time For Goal Achievement: 07/20/15 Potential to Achieve Goals: Good    Frequency Min 2X/week   Barriers to discharge Inaccessible home environment stair to enter,but able to reach railing; wife home 24/7    Co-evaluation               End of Session Equipment Utilized During Treatment: Gait belt Activity Tolerance: Patient tolerated treatment well Patient left: in bed;with call bell/phone within reach;with family/visitor present (pt refused bed alarm)           Time: 0211-1735 PT Time Calculation (min) (ACUTE ONLY): 9 min   Charges:   PT Evaluation $Initial PT Evaluation Tier I: 1 Procedure     PT G Codes:        Hopkins,Jaquanda Wickersham, PT, DPT 07/06/2015, 10:53 AM

## 2015-07-06 NOTE — Anesthesia Postprocedure Evaluation (Signed)
  Anesthesia Post-op Note  Patient: Grant Lee  Procedure(s) Performed: Procedure(s) with comments: ESOPHAGOGASTRODUODENOSCOPY (EGD) and percutaneous gastrostomy tyube placement (N/A) - Residual need to be done in the afternoon on 07/06/2015. Combined procedure with Dr. Gustavo Lah and Dr Rayann Heman  Anesthesia type:General  Patient location: PACU  Post pain: Pain level controlled  Post assessment: Post-op Vital signs reviewed, Patient's Cardiovascular Status Stable, Respiratory Function Stable, Patent Airway and No signs of Nausea or vomiting  Post vital signs: Reviewed and stable  Last Vitals:  Filed Vitals:   07/06/15 1307  BP: 162/107  Pulse: 76  Temp: 36.4 C  Resp: 20    Level of consciousness: awake, alert  and patient cooperative  Complications: No apparent anesthesia complications

## 2015-07-06 NOTE — Plan of Care (Signed)
Problem: Discharge Progression Outcomes Goal: Other Discharge Outcomes/Goals Outcome: Progressing VSS. PEG placed today, dressing dry and intact. Pain managed with dilaudid. Denies n/v.

## 2015-07-06 NOTE — Telephone Encounter (Signed)
Spoke with patient regarding appt on Monday 07/09/15 @ 1030 am.  Patient stated that he make the appointment unless he was still in the hospital.

## 2015-07-06 NOTE — Progress Notes (Signed)
Nutrition Follow-up  DOCUMENTATION CODES:   Severe malnutrition in context of chronic illness  INTERVENTION:   EN: Pt getting PEG today. Will await approval to initiate EN. Spoke with MD, Posey Pronto. Pt will go home at discharge therefore will continue to recommend 6 cans of Jevity 1.5 bolus feedings per day. RD to educate family tomorrow once PEG is placed on tube feedings.  Coordination of Care: MD Posey Pronto agreeable to checking BMP and Mg and P again tomorrow to monitor electrolytes. MD aware of possible risk of refeeding syndrome once tube feedings started. Will follow electrolytes.    NUTRITION DIAGNOSIS:   Malnutrition related to cancer and cancer related treatments, dysphagia as evidenced by percent weight loss, energy intake < 75% for > or equal to 1 month, severe depletion of body fat, severe depletion of muscle mass.  GOAL:   Patient will meet greater than or equal to 90% of their needs  MONITOR:    (Energy Intake, Anthropometrics, digestive system, Electrolyte and Renal Profile)   ASSESSMENT:   Per MD note, pt to have PEG placed Friday 07/06/2015.   Diet Order:  Diet NPO time specified    Gastrointestinal Profile: Last BM: 07/03/2015   Medications: NS at 7m/hr, MgS given  Electrolyte/Renal Profile and Glucose Profile:   Recent Labs Lab 07/03/15 1104 07/04/15 0539 07/06/15 0751  NA 136 137  --   K 4.0 3.6  --   CL 102 106  --   CO2 27 27  --   BUN <5* <5*  --   CREATININE 0.73 0.59*  --   CALCIUM 8.3* 8.1*  --   MG  --   --  1.5*  PHOS  --   --  3.9  GLUCOSE 82 124*  --    Protein Profile:  Recent Labs Lab 07/03/15 1104  ALBUMIN 2.6*    Weight Trend since Admission: Filed Weights   07/03/15 0953 07/05/15 1330 07/06/15 0507  Weight: 143 lb (64.864 kg) 136 lb (61.689 kg) 134 lb 9.6 oz (61.054 kg)    Ideal Body Weight:  86 kg  BMI:  Body mass index is 17.27 kg/(m^2).  Estimated Nutritional Needs:   Kcal:  BEE: 1744kcals, TEE: (IF 1.1-1.3)(AF  1.2) 2296-2713kcals, using IBW of 86kg  Protein:  86-103g of protein (1.0-1.2g/kg) using IBW of 86kg  Fluid:  2150-25832mof fluid (25-3044mg) using IBW of 86kg  EDUCATION NEEDS:   Education needs no appropriate at this time  HIGHewlettD, LDN Pager [phone]

## 2015-07-06 NOTE — Progress Notes (Signed)
Unable to get consent today patient had anesthesia while undergoing an endoscopy.  Patient's port can be done early next week as an outpatient if needed.

## 2015-07-06 NOTE — Progress Notes (Signed)
Speech Therapy Note: attempted to see pt today, however, he is out of room at procedure. ST will f/u next 1-3 days w/ further education and exs.

## 2015-07-06 NOTE — Consult Note (Signed)
ONCOLOGY CONSULTATION NOTE  -  Reason for Consultation: Recently diagnosed recurrent head and neck cancer, admitted with unintentional weight loss, inability to eat/drink, dehydration.   History of Present Illness:  Patient is a 59 year old gentleman with past medical history as stated below, patient has a history of right-sided tongue cancer in 2006. States that he did continue intermittently smoking up until December 2015 and has quit since then. More recently he was found to have positive left tongue base lesion and underwent biopsy on 06/26/2015 which is positive for invasive squamous cell carcinoma. Patient states that he has been trying to eat better but has had 30-40 pound unintentional weight loss. He has become extremely weak and intermittently lightheaded on getting up and ambulating. Denies any falls, loss of consciousness or headaches. No bleeding symptoms. He has been referred to emergency room by her ENT doctor Tami Ribas and has been hospitalized for IV hydration and is planned for GI consultation to consider PEG tube placement. Currently this finishes. Denies fever or chills. No vomiting or diarrhea.   Past Medical History  Diagnosis Date  . Hypertension   . Pneumonia     2004  . GERD (gastroesophageal reflux disease)   . Arthritis   . Cancer   . Anemia     blood transfusion 2012  . Bleeding ulcer   . Machinery accident     LEG INJURY    Patient Active Problem List   Diagnosis Date Noted  . Malnutrition 07/03/2015  . Aspiration pneumonitis 07/03/2015  . DU (duodenal ulcer) 06/11/2015  . Tongue cancer 06/11/2015  . Abnormal loss of weight 10/09/2014  . Can't get food down 10/09/2014    Past Surgical History  Procedure Laterality Date  . Leg surgery    . Joint replacement      right hip  . Tongue biopsy    . Tongue surgery      multiple surgeries  . Direct laryngoscopy N/A  06/26/2015    Procedure: Laryngoscopy with tongue base biopsy ; Surgeon: Beverly Gust, MD; Location: [facility] ORS; Service: ENT; Laterality: N/A;    Past Surgical History  Procedure Laterality Date  . Leg surgery    . Joint replacement      right hip  . Tongue biopsy    . Tongue surgery      multiple surgeries  . Direct laryngoscopy N/A 06/26/2015    Procedure: Laryngoscopy with tongue base biopsy ; Surgeon: Beverly Gust, MD; Location: [facility] ORS; Service: ENT; Laterality: N/A;    No current outpatient prescriptions on file.  Allergies:  Review of patient's allergies indicates no known allergies.  Family History: History reviewed. No pertinent family history.  Social History: Social History  Substance Use Topics  . Smoking status: Former Smoker -- 1.00 packs/day for 0 years    Types: Cigarettes    Quit date: 09/21/2014  . Smokeless tobacco: None  . Alcohol Use: Yes     Comment: beer/wine every day     Review of Systems: Constitutional: As in history of present illness above. No fever or chills.   HEENT: No headaches, epistaxis, ear or jaw pain. Cardiac: No chest pain Respiratory: No shortness of breath, wheezing, or stridor Abdomen: No abdominal pain, no vomiting, No diarrhea Endocrine: Weight loss. No polyuria polydipsia Extremities: No new swelling or pain Skin: No rashes, easy bruising Urologic: No dysuria, Hematuria. Neurologic: No focal weakness or seizures  GENERAL: No distress, well nourished. Sitting in bed reading magazines. SKIN:  No rashes or significant lesions  HEAD: Normocephalic, No masses, lesions, tenderness or abnormalities palpable LUNGS: Clear to auscultation, no crackles or wheezes HEART: Regular rate & rhythm, no murmurs, no gallops, S1 normal and S2 normal  ABDOMEN: Abdomen soft, non-tender, normal  bowel sounds, no masses or organomegaly and no hepatosplenomegaly  MSK: No CVA tenderness and no tenderness on percussion of the back or rib cage. EXTREMITIES: No edema, no skin discoloration or tenderness NEURO: Alert & oriented, no focal motor/sensory deficits.   Labs:    COMPREHENSIVE METABOLIC PANEL - Abnormal; Notable for the following:    BUN <5 (*)    Calcium 8.3 (*)    Total Protein 6.0 (*)    Albumin 2.6 (*)    ALT 8 (*)    Total Bilirubin 0.1 (*)    All other components within normal limits  CBC WITH DIFFERENTIAL/PLATELET - Abnormal; Notable for the following:    RBC 3.54 (*)    Hemoglobin 11.9 (*)    HCT 35.8 (*)    MCV 101.0 (*)    Neutro Abs 7.6 (*)    All other components within normal limits       06/26/15 - Pathology Report: DIAGNOSIS:  A. TONGUE, LEFT BASE; BIOPSY:  INVASIVE SQUAMOUS CELL CARCINOMA, KERATINIZING TYPE. ULCERATION AND NECROSIS.  B. TONGUE, MIDDLE BASE: BIOPSY: NEGATIVE FOR DYSPLASIA AND MALIGNANCY.  C. TONGUE, RIGHT BASE; BIOPSY: NEGATIVE FOR DYSPLASIA AND MALIGNANCY.   IMPRESSION / RECOMMENDATIONS: 1. Second primary tongue cancer. T3 N0 M0 tumor. Patient has been scheduled for outpatient follow-up with Dr. Oliva Bustard on Thursday, September 1 and for chemotherapy with cisplatin on Friday, September 2, to be treated with concurrent radiation therapy. 2. Malnutrition. Secondary to severe dysphasia related to malignancy. Patient is to have PEG tube placed today. 3. Difficulty with venous access. Vascular consult was entered yesterday and called this morning to Dr. Delana Meyer. Hopefully will have Port-A-Cath placed as soon as possible.   If patient discharges over the weekend please note follow-up appointments as above.   Dr. Oliva Bustard was available for consultation and review of plan of care for this patient.

## 2015-07-06 NOTE — Anesthesia Preprocedure Evaluation (Addendum)
Anesthesia Evaluation  Patient identified by MRN, date of birth, ID band Patient awake    Reviewed: Allergy & Precautions, NPO status , Patient's Chart, lab work & pertinent test results  History of Anesthesia Complications Negative for: history of anesthetic complications  Airway Mallampati: III  TM Distance: >3 FB Neck ROM: Full   Comment: Had a base of tongue lesion biopsied 06/26/15--left base, still sore and perhaps friable. Cannot swallow. Dental  (+) Edentulous Upper, Edentulous Lower   Pulmonary former smoker,    Pulmonary exam normal       Cardiovascular hypertension, Pt. on medications Normal cardiovascular exam    Neuro/Psych    GI/Hepatic PUD, GERD-  Medicated,  Endo/Other    Renal/GU      Musculoskeletal   Abdominal (+) + scaphoid  Abdomen: soft.    Peds  Hematology  (+) anemia ,   Anesthesia Other Findings   Reproductive/Obstetrics                           Anesthesia Physical  Anesthesia Plan  ASA: IV  Anesthesia Plan: General   Post-op Pain Management:    Induction: Intravenous  Airway Management Planned: Nasal Cannula  Additional Equipment:   Intra-op Plan:   Post-operative Plan:   Informed Consent: I have reviewed the patients History and Physical, chart, labs and discussed the procedure including the risks, benefits and alternatives for the proposed anesthesia with the patient or authorized representative who has indicated his/her understanding and acceptance.     Plan Discussed with: CRNA and Surgeon  Anesthesia Plan Comments: (I am worried about the GI scope irritating the recent biopsy site at the left base of tongue and causing bleeding. I have discussed this possibility with the patient and the endoscopist. We will have the glidescope in the room in case we have to intubate him. We may have to abort the case if there is significant bleeding.)       Anesthesia Quick Evaluation

## 2015-07-06 NOTE — Plan of Care (Signed)
Problem: Discharge Progression Outcomes Goal: Other Discharge Outcomes/Goals Outcome: Progressing Pt is alert and oriented, c/o throat pain, 2 mg Dilaudid given with relief. Phenol spray given as needed. Pt refuses bed alarm, educated and encouraged to call for assistance. D 5 with 1/2 NS continued at 100 ml/hr. Remains NPO at this time.

## 2015-07-07 LAB — BASIC METABOLIC PANEL
ANION GAP: 5 (ref 5–15)
BUN: <5 mg/dL — ABNORMAL LOW (ref 6–20)
CHLORIDE: 105 mmol/L (ref 101–111)
CO2: 29 mmol/L (ref 22–32)
Calcium: 8.3 mg/dL — ABNORMAL LOW (ref 8.9–10.3)
Creatinine, Ser: 0.69 mg/dL (ref 0.61–1.24)
GFR calc non Af Amer: >60 mL/min (ref >60)
Glucose, Bld: 96 mg/dL (ref 65–99)
Potassium: 3.5 mmol/L (ref 3.5–5.1)
Sodium: 139 mmol/L (ref 135–145)

## 2015-07-07 LAB — MAGNESIUM: Magnesium: 1.9 mg/dL (ref 1.7–2.4)

## 2015-07-07 LAB — PHOSPHORUS: PHOSPHORUS: 3.6 mg/dL (ref 2.5–4.6)

## 2015-07-07 MED ORDER — PRAVASTATIN SODIUM 20 MG PO TABS: 40.0000 mg | ORAL_TABLET | Freq: Every day | ORAL | Status: DC

## 2015-07-07 MED ORDER — PHENOL 1.4 % MT LIQD: 1.0000 | OROMUCOSAL | Status: AC | PRN

## 2015-07-07 MED ORDER — TRAZODONE HCL 50 MG PO TABS: 50.0000 mg | ORAL_TABLET | Freq: Every day | ORAL | Status: DC

## 2015-07-07 MED ORDER — JEVITY 1.5 CAL/FIBER PO LIQD: 237.0000 mL | Freq: Four times a day (QID) | ORAL | Status: DC

## 2015-07-07 MED ORDER — CHLORHEXIDINE GLUCONATE 0.12 % MT SOLN: 15.0000 mL | Freq: Two times a day (BID) | OROMUCOSAL | Status: DC

## 2015-07-07 MED ORDER — LISINOPRIL 10 MG PO TABS: 10.0000 mg | ORAL_TABLET | Freq: Every day | ORAL | Status: DC

## 2015-07-07 MED ORDER — OXYCODONE-ACETAMINOPHEN 7.5-325 MG PO TABS: 1.0000 | ORAL_TABLET | ORAL | Status: DC | PRN

## 2015-07-07 MED ORDER — OXYCODONE HCL 5 MG PO TABS
30.0000 mg | ORAL_TABLET | Freq: Four times a day (QID) | ORAL | Status: DC | PRN
  Administered 2015-07-07: 30 mg
  Filled 2015-07-07: qty 6

## 2015-07-07 NOTE — Progress Notes (Signed)
Pt was discharged today before I could see him.   I will have him come to clinic on Monday or Tuesday of next week for loosening of the external bumper and site check.

## 2015-07-07 NOTE — Procedures (Signed)
PEG Procedure Note:  Ind:  Malnutrition, ENT ca.   I performed the PEG procedure in conjunction with Dr. Gustavo Lah.  Due to the presence of throat cancer, the introducer technique was used instead of the standard pull-through technique.  After upper endoscopy, an abdominal site was identified with transillumination and finger indentation. The site was prepped in a sterile fashion. Site was then injected with 4 mL's of 1% lidocaine. 4 T-fasteners were inserted into the stomach. An small incision was made with the scalpel in the skin and the needle introducer was advanced into the stomach. A wire was then passed through the introducer and the introducer was removed. The dilator was then advanced over the wire and dilated to allow insertion of the introducer tube. The dilator was then removed and an 22 [place] Kangaroo G-tube was fed through the introducer tube. The balloon was inflated and the external bumper was pulled snug to 2 cm. The introducer tube was peeled away.  The external bumper was noted to slide freely along the G-tube without providing traction. 1 suture was placed to hold the external bumper near the skin.  Estimated blood loss: 5 mL's There was no pain postprocedure. Patient was returned to the floor in good condition.

## 2015-07-07 NOTE — Discharge Summary (Signed)
[facility] NAME: Grant Lee    MR#:  [id number]  DATE OF BIRTH:  10-03-1956  DATE OF ADMISSION:  07/03/2015 ADMITTING PHYSICIAN: Theodoro Grist, MD  DATE OF DISCHARGE: 07/06/2015  PRIMARY CARE PHYSICIAN: Cyndi Bender, PA-C    ADMISSION DIAGNOSIS:  Cough [R05] Cancer of tongue [C02.9]  DISCHARGE DIAGNOSIS:  Recurrent cancer of tongue Severe protein cal malnutrition due to dysphagia s/p PEG placement  SECONDARY DIAGNOSIS:   Past Medical History  Diagnosis Date  . Hypertension   . Pneumonia     2004  . GERD (gastroesophageal reflux disease)   . Arthritis   . Cancer   . Anemia     blood transfusion 2012  . Bleeding ulcer   . Machinery accident     [facility] COURSE:   59 y.o. male with a known history of right tongue cancer status post operation in 2006, who presents to the hospital with complaints of difficulty swallowing, pain in her throat area on the left and significant weight loss  1. Severe protein calorie malnutrition due to odynophagia, dysphagia due to tongue cancer -seen by Oncology and GI and PEG placement is recommended. toelrated proceudre on august 26th -dietitian to start PEG bolus feeding -pt taking ice for pleasure due to severe dry oral mucosa. -seen by Speech therapist   2. Dysphagia, odynophagia, as above. Medications will be continued as needed. Speech therapist evaluation  -MBSS shows s/o aspiration -No cxr findings of aspiration  3. 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 Pt has f/u appt for next week -pt to get port placed as outpt per dr Ronalee Belts  4. Anemia-chronic Stable  5. Chronic Oral pain Prn poe oxycodone and percocet(per pt's home dose schedule)   DISCHARGE CONDITIONS:   fiar  CONSULTS OBTAINED:  Treatment Team:  Leia Alf, MD Lollie Sails, MD  DRUG ALLERGIES:  No Known Allergies  DISCHARGE  MEDICATIONS:   Current Discharge Medication List    START taking these medications   Details  chlorhexidine (PERIDEX) 0.12 % solution 15 mLs by Mouth Rinse route 2 (two) times daily. Qty: 120 mL, Refills: 0    phenol (CHLORASEPTIC) 1.4 % LIQD Use as directed 1 spray in the mouth or throat as needed for throat irritation / pain. Qty: 177 mL, Refills: 0      CONTINUE these medications which have NOT CHANGED   Details  lisinopril (PRINIVIL,ZESTRIL) 10 MG tablet 10 mg once at bedtime    omeprazole (PRILOSEC) 40 MG capsule 40 mg once in the morning    Oxycodone HCl 20 MG TABS 30 mg every 6 (six) hours.    oxyCODONE-acetaminophen (PERCOCET) 7.5-325 MG per tablet Take 1 tablet by mouth every 4 (four) hours as needed for severe pain. Qty: 30 tablet, Refills: 0    pravastatin (PRAVACHOL) 40 MG tablet 40 mg once bedtime    Probiotic Product (PROBIOTIC DAILY) CAPS Take 1 capsule by mouth daily.     traZODone (DESYREL) 50 MG tablet 50 mg nightly.        If you experience worsening of your admission symptoms, develop shortness of breath, life threatening emergency, suicidal or homicidal thoughts you must seek medical attention immediately by calling 911 or calling your MD immediately  if symptoms less severe.  You Must read complete instructions/literature along with all the possible adverse reactions/side effects for all the Medicines you take and that have been prescribed to  you. Take any new Medicines after you have completely understood and accept all the possible adverse reactions/side effects.   Please note  You were cared for by a hospitalist during your hospital stay. If you have any questions about your discharge medications or the care you received while you were in the hospital after you are discharged, you can call the unit and asked to speak with the hospitalist on call if the hospitalist that took care of you is not available. Once you are discharged, your primary care  physician will handle any further medical issues. Please note that NO REFILLS for any discharge medications will be authorized once you are discharged, as it is imperative that you return to your primary care physician (or establish a relationship with a primary care physician if you do not have one) for your aftercare needs so that they can reassess your need for medications and monitor your lab values. Today   SUBJECTIVE   No new complaints  VITAL SIGNS:  Blood pressure 162/100, pulse 76, temperature 98.1 F (36.7 C), temperature source Oral, resp. rate 18, height '6\' 2"'$  (1.88 m), weight 61.054 kg (134 lb 9.6 oz), SpO2 97 %.  I/O:   Intake/Output Summary (Last 24 hours) at 07/07/15 0736 Last data filed at 07/07/15 0150  Gross per 24 hour  Intake    800 ml  Output    725 ml  Net     75 ml    PHYSICAL EXAMINATION:   GENERAL: 59 y.o.-year-old patient lying in the bed with no acute distress. Cachectic, thin EYES: Pupils equal, round, reactive to light and accommodation. No scleral icterus. Extraocular muscles intact.  HEENT: Head atraumatic, normocephalic. Oropharynx and nasopharynx clear. Oral chronic changes due to cancer/radiation NECK: Supple, no jugular venous distention. No thyroid enlargement, no tenderness.  LUNGS: Normal breath sounds bilaterally, no wheezing, rales, rhonchi. No use of accessory muscles of respiration.  CARDIOVASCULAR: S1, S2 normal. No murmurs, rubs, or gallops.  ABDOMEN: Soft, nontender, nondistended. Bowel sounds present. No organomegaly or mass. PEG + EXTREMITIES: No cyanosis, clubbing or edema b/l.  NEUROLOGIC: Cranial nerves II through XII are intact. No focal Motor or sensory deficits b/l.  PSYCHIATRIC: The patient is alert and oriented x 3.  SKIN: No obvious rash, lesion, or ulcer.  DATA REVIEW:   CBC   Recent Labs Lab 07/04/15 0539  WBC 10.6  HGB 11.7*  HCT 35.0*  PLT 295    Chemistries   Recent Labs Lab 07/03/15 1104   07/07/15 0606  NA 136  < > 139  K 4.0  < > 3.5  CL 102  < > 105  CO2 27  < > 29  GLUCOSE 82  < > 96  BUN <5*  < > <5*  CREATININE 0.73  < > 0.69  CALCIUM 8.3*  < > 8.3*  MG  --   < > 1.9  AST 23  --   --   ALT 8*  --   --   ALKPHOS 81  --   --   BILITOT 0.1*  --   --   < > = values in this interval not displayed.  Microbiology Results   No results found for this or any previous visit (from the past 240 hour(s)).  RADIOLOGY:  No results found.   Management plans discussed with the patient, family and they are in agreement.  CODE STATUS:     Code Status Orders        Start  Ordered   07/03/15 1514  Full code   Continuous     07/03/15 1513      TOTAL TIME TAKING CARE OF THIS PATIENT: 40 minutes.    Grant Lee M.D on 07/07/2015 at 7:36 AM  Between 7am to 6pm - Pager - [phone] After 6pm go to [email or web address] - password [email or web address] [facility]  Office  [phone]  CC: Primary care physician; Cyndi Bender, [name]

## 2015-07-07 NOTE — Plan of Care (Signed)
Problem: Discharge Progression Outcomes Goal: Other Discharge Outcomes/Goals Outcome: Progressing Plan of Care Progress to Goal:   Pt has reported pain multiple times during shift. BP is elevated. PEG site is clean dry and intact. No other signs of distress noted. Will continue to monitor.

## 2015-07-07 NOTE — Progress Notes (Signed)
SLP Note  Patient Details Name: Grant Lee MRN: [id number] DOB: 1956-10-10  Speech Therapy Note: Reviewed chart and spoke w/pt and family. Pt to be discharged home today following education on PEG feedings. Pt reports he is feeling a lot better. Discussed swallowing and oral motor exercises with pt as well as continue to swallow ice chips. Pt reports that he recalls using exercises to aid in jaw flexibility during last round of radiation tx. Recommend pt have repeat MBSS in next 2-3 mos. Pt in agreement. Pt is aware that he needs to inform MD if any further swallowing changes occur.            [place],Maaran 07/07/2015, 9:41 AM

## 2015-07-07 NOTE — Progress Notes (Signed)
Nutrition Follow-up  DOCUMENTATION CODES:   Severe malnutrition in context of chronic illness  INTERVENTION:  EN: Spoke with Dr. Posey Pronto and planning disharge today following tube feeding instruction. Discussed tube feeding regimen with pt and wife at bedside.  Tube feeding at home booklet given.  All questions answered. Teachback used.   NUTRITION DIAGNOSIS:   Malnutrition related to cancer and cancer related treatments, dysphagia as evidenced by percent weight loss, energy intake < 75% for > or equal to 1 month, severe depletion of body fat, severe depletion of muscle mass.    GOAL:   Patient will meet greater than or equal to 90% of their needs    MONITOR:    (Energy Intake, Anthropometrics, digestive system, Electrolyte and Renal Profile)  REASON FOR ASSESSMENT:   Consult, Malnutrition Screening Tool Poor PO  ASSESSMENT:   Per MD note, pt to have PEG placed Friday 07/06/2015.   Current Nutrition: NPO    Medications: reviewed  Electrolyte/Renal Profile and Glucose Profile:   Recent Labs Lab 07/03/15 1104 07/04/15 0539 07/06/15 0751 07/07/15 0606  NA 136 137  --  139  K 4.0 3.6  --  3.5  CL 102 106  --  105  CO2 27 27  --  29  BUN <5* <5*  --  <5*  CREATININE 0.73 0.59*  --  0.69  CALCIUM 8.3* 8.1*  --  8.3*  MG  --   --  1.5* 1.9  PHOS  --   --  3.9 3.6  GLUCOSE 82 124*  --  96   Protein Profile:  Recent Labs Lab 07/03/15 1104  ALBUMIN 2.6*     Weight Trend since Admission: Filed Weights   07/03/15 0953 07/05/15 1330 07/06/15 0507  Weight: 143 lb (64.864 kg) 136 lb (61.689 kg) 134 lb 9.6 oz (61.054 kg)      Diet Order:  Diet NPO time specified  Skin:   reviewed   Height:   Ht Readings from Last 1 Encounters:  07/03/15 '6\' 2"'$  (1.88 m)    Weight:   Wt Readings from Last 1 Encounters:  07/06/15 134 lb 9.6 oz (61.054 kg)    Ideal Body Weight:  86 kg  BMI:  Body mass index is 17.27 kg/(m^2).  Estimated Nutritional Needs:    Kcal:  BEE: 1744kcals, TEE: (IF 1.1-1.3)(AF 1.2) 2296-2713kcals, using IBW of 86kg  Protein:  86-103g of protein (1.0-1.2g/kg) using IBW of 86kg  Fluid:  2150-2552m of fluid (25-32mkg) using IBW of 86kg  EDUCATION NEEDS:   Education needs no appropriate at this time  MODERATE Care Level  Margrete Delude B. AlZenia ResidesRDSpring RidgeLDRuthvillepager)

## 2015-07-07 NOTE — Care Management Note (Signed)
Case Management Note  Patient Details  Name: SABRI TEAL MRN: [id number] Date of Birth: 13-Dec-1955  Subjective/Objective:       Faxed and called to [facility] after they were chosen from the list of providers by Mrs Snelgrove. Requested home health RN and PT. Requested that RN see Mr Bua on Sunday per new PEG and to replenish home supply of Jevity feeding cans.             Action/Plan:   Expected Discharge Date:                  Expected Discharge Plan:  Home/Self Care  In-House Referral:     Discharge planning Services  CM Consult  Post Acute Care Choice:    Choice offered to:     DME Arranged:    DME Agency:     HH Arranged:    HH Agency:     Status of Service:  In process, will continue to follow  Medicare Important Message Given:  Yes-second notification given Date Medicare IM Given:    Medicare IM give by:    Date Additional Medicare IM Given:    Additional Medicare Important Message give by:     If discussed at [place] of Stay Meetings, dates discussed:    Additional Comments:  Corrisa Gibby A, RN 07/07/2015, 10:43 AM

## 2015-07-07 NOTE — Discharge Instructions (Signed)
NPO, ice chips prn for pleasure PEG care per instructions Cancer of the Tongue Cancer of the tongue occurs when a group of cells on the tongue become abnormal and start to grow out of control. Most of the time, tongue cancer starts in very thin, flat cells that cover the surface of your tongue (squamous cells). Cancer cells can spread and form a mass of cells called a tumor. The tumor may spread deeper into the tongue, or it may spread to other areas of the body (metastasize). RISK FACTORS The exact cause of cancer of the tongue is not known. However, some risk factors make this more likely:  Use of tobacco products, including cigarettes, pipes, cigars, smokeless (chewing) tobacco, and snuff. This is the number one risk factor of cancer of the tongue.  Male gender.  Age of 53 years or older.  Poor oral hygiene (not brushing or flossing your teeth regularly).  Frequent use of alcohol.  Human papillomavirus (HPV) infection.  Family history of tongue cancer. SYMPTOMS Tongue cancer can start in 1 of 2 places. It can start at the front part of the tongue or at the base of the tongue at the back of your throat.Symptoms of cancer at the front part of your tongue may include:  A lump or sore on your tongue that may be painful (especially when you eat or speak) and may not heal. It also may bleed easily if you bite it or touch it.  Numbness on your tongue.  Difficulty moving your tongue.  Pain when chewing.  Difficulty pronouncing or saying certain words or making certain sounds.  A bad odor in your mouth.  A lump on your neck. Cancer at the base of the tongue is harder to see. Symptoms may not show up as soon as they do for cancer at the front part of your tongue. Symptoms may include:  A feeling in your throat that you are choking (especially when you are lying down).  Difficulty swallowing or pain when you swallow.  A  muffled voice.  Ear pain.  Pain when you try to open your mouth or difficulty opening your mouth.  A lump on your neck. DIAGNOSIS To diagnose tongue cancer, your caregiver may perform the following exams:  A physical exam of your mouth, throat, and neck for a sore or lump. Your caregiver may use a mirror with a long handle or a thin, flexible tube with a tiny light and camera at the end (fiberscope) to see the back of your mouth.  Removal and exam of a small number of cells (biopsy) from your tongue or a lump on your neck. The cells are checked for cancerous formations under a microscope.  Imaging exams, such as X-rays of your mouth and neck. The images can show if there is an abnormal mass. If you do have cancer, your caregiver will stage your cancer. Staging provides an idea of how advanced your cancer is. The stage will depend on how much your cancer has grown and if it has metastasized. The meaning of the stage depends on the type of cancer. For tongue cancer:  Stage I means the cancer is the size of a peanut or smaller. It has not metastasized.  Stage II means the cancer is larger than a peanut, but not larger than a walnut. It has not metastasized.  Stage III means the cancer has grown larger than a walnut. It may have spread to a lymph node or lymph gland on the same  side of your neck as the cancer. (Lymph is a fluid that carries white blood cells all over your body. White blood cells fight infection.)  Stage IV means the cancer has spread to nearby areas. It may have spread heavily into your lymph glands. TREATMENT Treatment for tongue cancer can vary. It will depend on the stage and location of your cancer and your overall health. Treatment options may include:  Radiation therapy. This uses waves of nuclear energy to kill cancer cells on your tongue. It may be used for stage I and II cancers. It often is used for cancers at the base of your tongue.  Surgery:  Surgery is done  if your tumor is small, has not spread, and is at the front of your tongue.  Surgery may be done to remove tumors that have spread into your neck or lymph nodes.  A combination of surgery, radiation, and drugs that kills cancer cells (chemotherapy). This may be done for stage III and stage IV cancers. SEEK MEDICAL CARE IF:  Your tongue hurts or is numb.  The way you speak changes.  The way you swallow changes.  You notice a lump on your neck.  You have an oral temperature above 100.5 F (38.1 C). SEEK IMMEDIATE MEDICAL CARE IF:   You have pain that gets worse.  Your tongue or mouth bleeds.  Your lip, mouth, or neck swells.  You have trouble swallowing.  You have trouble breathing.  You have an oral temperature above 102 F (38.9 C). Document Released: 02/11/2011 Document Revised: 01/19/2012 Document Reviewed: 02/11/2011 [facility] Patient Information 2015 [place], [place]. This information is not intended to replace advice given to you by your health care provider. Make sure you discuss any questions you have with your health care provider.  Chemotherapy Many people are apprehensive about chemotherapy due to concerns over uncomfortable side effects. However, managements for side effects have come a long way. Many side effects once associated with chemotherapy can be prevented and/or controlled. WHAT IS CHEMOTHERAPY? Chemotherapy is the general term for any treatment involving the use of chemical agents. Chemotherapy can be given through a vein, most commonly through an implanted port* or PICC line.* It can also be delivered by mouth (orally) in the form of a pill. The main goal of chemotherapy is to kill cancer cells and stop them from growing. It can destroy and eliminate cancer cells where the cancer started (primary tumor location) and throughout the body, often far away from the original cancer. It is a treatment that not only targets the original cancer location, but also the  entire body (systemic treatment) for full effect and results. Chemotherapy works by destroying cancer cells. Unfortunately, it cannot tell the difference between a cancer cell and some healthy cells. This results in the death of noncancerous cells, such as hair and blood cells. Harm to healthy cells is what causes side effects. These cells usually repair themselves after chemotherapy. Because some drugs work better together rather than alone, 2 or more drugs are often given at the same time. This is called combination chemotherapy. Depending on the type of cancer and how advanced it is, chemotherapy can be used for different goals:  Cure the cancer.  Keep the cancer from spreading.  Slow the cancer's growth.  Kill cancer cells that may have spread to other parts of the body from the original tumor.  Relieve symptoms caused by cancer. You and your caregiver will decide what drug or combination of drugs you  will get. Your caregiver will choose the doses, how the drugs will be given, how often, and how long you will get treatment. All of these decisions will depend on the type of cancer, where it is, how big it is, and how it is affecting your normal body functions and overall health. *Implanted port - A device that is implanted under your skin so that medicines may be delivered directly into your blood system. *PICC line (peripherally inserted central catheter) - A long, slender, flexible tube. This tube is often inserted into a vein, typically in the upper arm. The tip stops in the large central vein that leads to your heart. Document Released: 08/24/2007 Document Revised: 01/19/2012 Document Reviewed: 02/08/2009 [facility] Patient Information 2015 [place], [place]. This information is not intended to replace advice given to you by your health care provider. Make sure you discuss any questions you have with your health care provider.

## 2015-07-07 NOTE — Progress Notes (Signed)
Home Health PT, and RN for new ostomy teaching and monitoring. V.O. Dr Gus Height Patel/Eames Dibiasio, RN, BSN, CM, 0n 07/07/15 @ 10:30am.

## 2015-07-08 DIAGNOSIS — E43 Unspecified severe protein-calorie malnutrition: Secondary | ICD-10-CM | POA: Diagnosis not present

## 2015-07-08 DIAGNOSIS — I10 Essential (primary) hypertension: Secondary | ICD-10-CM | POA: Diagnosis not present

## 2015-07-08 DIAGNOSIS — D649 Anemia, unspecified: Secondary | ICD-10-CM | POA: Diagnosis not present

## 2015-07-08 DIAGNOSIS — K219 Gastro-esophageal reflux disease without esophagitis: Secondary | ICD-10-CM | POA: Diagnosis not present

## 2015-07-08 DIAGNOSIS — Z431 Encounter for attention to gastrostomy: Secondary | ICD-10-CM | POA: Diagnosis not present

## 2015-07-08 DIAGNOSIS — C029 Malignant neoplasm of tongue, unspecified: Secondary | ICD-10-CM | POA: Diagnosis not present

## 2015-07-08 DIAGNOSIS — R1310 Dysphagia, unspecified: Secondary | ICD-10-CM | POA: Diagnosis not present

## 2015-07-09 ENCOUNTER — Encounter: Payer: Self-pay | Admitting: Radiation Oncology

## 2015-07-09 ENCOUNTER — Ambulatory Visit
Admission: RE | Admit: 2015-07-09 | Discharge: 2015-07-09 | Disposition: A | Discharge status: Home / Self Care | Payer: Medicare Other | Source: Ambulatory Visit | Attending: Radiation Oncology | Admitting: Radiation Oncology

## 2015-07-09 VITALS — BP 106/97 | HR 90 | Temp 96.9°F | Wt 142.5 lb

## 2015-07-09 DIAGNOSIS — I10 Essential (primary) hypertension: Secondary | ICD-10-CM | POA: Diagnosis not present

## 2015-07-09 DIAGNOSIS — C029 Malignant neoplasm of tongue, unspecified: Secondary | ICD-10-CM

## 2015-07-09 DIAGNOSIS — Z87891 Personal history of nicotine dependence: Secondary | ICD-10-CM | POA: Insufficient documentation

## 2015-07-09 DIAGNOSIS — E43 Unspecified severe protein-calorie malnutrition: Secondary | ICD-10-CM | POA: Diagnosis not present

## 2015-07-09 DIAGNOSIS — Z510 Encounter for antineoplastic radiation therapy: Secondary | ICD-10-CM | POA: Insufficient documentation

## 2015-07-09 DIAGNOSIS — C01 Malignant neoplasm of base of tongue: Secondary | ICD-10-CM | POA: Diagnosis not present

## 2015-07-09 DIAGNOSIS — R1310 Dysphagia, unspecified: Secondary | ICD-10-CM | POA: Diagnosis not present

## 2015-07-09 DIAGNOSIS — D649 Anemia, unspecified: Secondary | ICD-10-CM | POA: Diagnosis not present

## 2015-07-09 DIAGNOSIS — K219 Gastro-esophageal reflux disease without esophagitis: Secondary | ICD-10-CM | POA: Diagnosis not present

## 2015-07-09 DIAGNOSIS — Z431 Encounter for attention to gastrostomy: Secondary | ICD-10-CM | POA: Diagnosis not present

## 2015-07-09 NOTE — Consult Note (Signed)
Except an outstanding is perfect of Radiation Oncology NEW PATIENT EVALUATION  Name: Grant Lee  MRN: [id number]  Date:   07/09/2015     DOB: 1956/10/02   This 59 y.o. male patient presents to the clinic for initial evaluation of recurrent base of tongue cancer.  REFERRING PHYSICIAN: Cyndi Bender, PA-C  CHIEF COMPLAINT: No chief complaint on file.   DIAGNOSIS: The encounter diagnosis was Tongue cancer.   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 He recently presented with increasing dysphagia and was found to have a lesion of the left tongue base biopsy was positive on 06/26/2015 for invasive squamous cell carcinoma. PET CT scan was performed showing hypermetabolic activity and thickening of the inferior aspect of the left base of tongue consistent with local tumor recurrence. 2 tumor extension left base of tongue inferiorly to involve the soft tissue of the midline anterior to the epiglottis. There is also an no evidence of metastatic disease in his neck nodes. Patient's had an unintentional 30-40 pound weight loss has continued to smoke intermittently was recently admitted for PEG tube placement after he was admitted for IV hydration and GI consult for weight loss. He is seen today for consideration of radiation treatment. PEG tube is functioning well. He has some CT evidence of aspiration pneumonia and has been cautioned not to eat food and reline his PEG tube for nutritional support at this time.  PLANNED TREATMENT REGIMEN: Concurrent chemoradiation with curative intent salvage radiation therapy to area of base of tongue by PET CT criteria  PAST MEDICAL HISTORY:  has a past medical history of Hypertension; Pneumonia; GERD  (gastroesophageal reflux disease); Arthritis; Cancer; Anemia; Bleeding ulcer; and Machinery accident.    PAST SURGICAL HISTORY:  Past Surgical History  Procedure Laterality Date  . Leg surgery    . Joint replacement      right hip  . Tongue biopsy    . Tongue surgery      multiple surgeries  . Direct laryngoscopy N/A 06/26/2015    Procedure: Laryngoscopy with tongue base biopsy ;  Surgeon: Beverly Gust, MD;  Location: [facility] ORS;  Service: ENT;  Laterality: N/A;    FAMILY HISTORY: family history is not on file.  SOCIAL HISTORY:  reports that he quit smoking about 9 months ago. His smoking use included Cigarettes. He smoked 1.00 pack per day for 0 years. He does not have any smokeless tobacco history on file. He reports that he drinks alcohol. He reports that he does not use illicit drugs.  ALLERGIES: Review of patient's allergies indicates no known allergies.  MEDICATIONS:  Current Outpatient Prescriptions  Medication Sig Dispense Refill  . chlorhexidine (PERIDEX) 0.12 % solution 15 mLs by Mouth Rinse route 2 (two) times daily. 120 mL 0  . lisinopril (PRINIVIL,ZESTRIL) 10 MG tablet 10 mg once at bedtime    . omeprazole (PRILOSEC) 40 MG capsule 40 mg once in the morning    . Oxycodone HCl 20 MG TABS 30 mg every 6 (six) hours.    Marland Kitchen oxyCODONE-acetaminophen (PERCOCET) 7.5-325 MG per tablet Take 1 tablet by mouth every 4 (four) hours as needed for severe pain. 30 tablet 0  . phenol (CHLORASEPTIC) 1.4 % LIQD Use as directed 1 spray in the mouth or throat as  needed for throat irritation / pain. 177 mL 0  . pravastatin (PRAVACHOL) 40 MG tablet 40 mg once bedtime    . Probiotic Product (PROBIOTIC DAILY) CAPS Take 1 capsule by mouth daily.     . traZODone (DESYREL) 50 MG tablet 50 mg nightly.     No current facility-administered medications for this encounter.    ECOG PERFORMANCE STATUS:  1 - Symptomatic but completely ambulatory  REVIEW OF SYSTEMS: Except for weight loss and  dysphasia Patient denies any weight loss, fatigue, weakness, fever, chills or night sweats. Patient denies any loss of vision, blurred vision. Patient denies any ringing  of the ears or hearing loss. No irregular heartbeat. Patient denies heart murmur or history of fainting. Patient denies any chest pain or pain radiating to her upper extremities. Patient denies any shortness of breath, difficulty breathing at night, cough or hemoptysis. Patient denies any swelling in the lower legs. Patient denies any nausea vomiting, vomiting of blood, or coffee ground material in the vomitus. Patient denies any stomach pain. Patient states has had normal bowel movements no significant constipation or diarrhea. Patient denies any dysuria, hematuria or significant nocturia. Patient denies any problems walking, swelling in the joints or loss of balance. Patient denies any skin changes, loss of hair or loss of weight. Patient denies any excessive worrying or anxiety or significant depression. Patient denies any problems with insomnia. Patient denies excessive thirst, polyuria, polydipsia. Patient denies any swollen glands, patient denies easy bruising or easy bleeding. Patient denies any recent infections, allergies or URI. Patient "s visual fields have not changed significantly in recent time.    PHYSICAL EXAM: BP 106/97 mmHg  Pulse 90  Temp(Src) 96.9 F (36.1 C)  Wt 142 lb 8.4 oz (64.65 kg) Oral cavity is clear no oral mucosal lesions are identified. Indirect mirror examination shows some asymmetry to the tongue base although no overt lesion is noted. No evidence of subject gastric cervical or supraclavicular adenopathy is appreciated. Patient is a functioning PEG tube placed. Well-developed well-nourished patient in NAD. HEENT reveals PERLA, EOMI, discs not visualized.  Oral cavity is clear. No oral mucosal lesions are identified. Neck is clear without evidence of cervical or supraclavicular adenopathy. Lungs are clear  to A&P. Cardiac examination is essentially unremarkable with regular rate and rhythm without murmur rub or thrill. Abdomen is benign with no organomegaly or masses noted. Motor sensory and DTR levels are equal and symmetric in the upper and lower extremities. Cranial nerves II through XII are grossly intact. Proprioception is intact. No peripheral adenopathy or edema is identified. No motor or sensory levels are noted. Crude visual fields are within normal range.   LABORATORY DATA: Pathology report reviewed showing invasive squamous cell carcinoma keratinizing type with ulceration and necrosis.    RADIOLOGY RESULTS: PET CT scan is reviewed   IMPRESSION: Recurrent squamous cell carcinoma the base of tongue in patient with squamous cell carcinoma of the tongue back in 2006 now seen for salvage treatment  PLAN: At this time I to go ahead with salvage radiation therapy using I am RT treatment planning and delivery to spare critical structures such as a salivary glands spinal cord and previously irradiated areas. Would plan on delivering 6000 cGy over 6 weeks. I would also favor using concurrent chemotherapy as radiation sensitizer. Risks and benefits of treatment including increasing dysphagia secondary to radiation esophagitis, fatigue, alteration of blood counts, and risks associated with retreatment of previously irradiated areas all were explained in detail to the patient.  He seems to comprehend my treatment plan well. I have set him up for CT simulation later this week. Have discussed the case personally with medical oncology. Case was also presented at our weekly tumor conference where similar opinion was rendered.  I would like to take this opportunity for allowing me to participate in the care of your patient.Armstead Peaks., MD

## 2015-07-10 ENCOUNTER — Ambulatory Visit
Admission: RE | Admit: 2015-07-10 | Discharge: 2015-07-10 | Disposition: A | Discharge status: Home / Self Care | Payer: Medicare Other | Source: Ambulatory Visit | Attending: Gastroenterology | Admitting: Gastroenterology

## 2015-07-10 ENCOUNTER — Other Ambulatory Visit: Payer: Self-pay | Admitting: Gastroenterology

## 2015-07-10 ENCOUNTER — Ambulatory Visit
Admission: RE | Admit: 2015-07-10 | Discharge: 2015-07-10 | Disposition: A | Discharge status: Home / Self Care | Payer: Medicare Other | Source: Ambulatory Visit | Attending: Radiation Oncology | Admitting: Radiation Oncology

## 2015-07-10 ENCOUNTER — Encounter: Payer: Self-pay | Admitting: Gastroenterology

## 2015-07-10 DIAGNOSIS — R634 Abnormal weight loss: Secondary | ICD-10-CM | POA: Diagnosis not present

## 2015-07-10 DIAGNOSIS — G8918 Other acute postprocedural pain: Secondary | ICD-10-CM | POA: Diagnosis not present

## 2015-07-10 DIAGNOSIS — J984 Other disorders of lung: Secondary | ICD-10-CM | POA: Diagnosis not present

## 2015-07-10 DIAGNOSIS — C029 Malignant neoplasm of tongue, unspecified: Secondary | ICD-10-CM | POA: Diagnosis not present

## 2015-07-10 DIAGNOSIS — R5082 Postprocedural fever: Secondary | ICD-10-CM | POA: Diagnosis not present

## 2015-07-10 DIAGNOSIS — I700 Atherosclerosis of aorta: Secondary | ICD-10-CM | POA: Insufficient documentation

## 2015-07-10 DIAGNOSIS — Z4682 Encounter for fitting and adjustment of non-vascular catheter: Secondary | ICD-10-CM | POA: Diagnosis not present

## 2015-07-10 DIAGNOSIS — Z510 Encounter for antineoplastic radiation therapy: Secondary | ICD-10-CM | POA: Diagnosis not present

## 2015-07-10 DIAGNOSIS — Z931 Gastrostomy status: Secondary | ICD-10-CM | POA: Insufficient documentation

## 2015-07-10 DIAGNOSIS — Z87891 Personal history of nicotine dependence: Secondary | ICD-10-CM | POA: Diagnosis not present

## 2015-07-10 DIAGNOSIS — C01 Malignant neoplasm of base of tongue: Secondary | ICD-10-CM | POA: Diagnosis not present

## 2015-07-10 DIAGNOSIS — C760 Malignant neoplasm of head, face and neck: Secondary | ICD-10-CM | POA: Diagnosis not present

## 2015-07-10 MED ORDER — IOHEXOL 300 MG/ML  SOLN
85.0000 mL | Freq: Once | INTRAMUSCULAR | Status: AC | PRN
  Administered 2015-07-10: 85 mL via INTRAVENOUS

## 2015-07-11 ENCOUNTER — Ambulatory Visit: Payer: Medicare Other | Admitting: Oncology

## 2015-07-12 ENCOUNTER — Inpatient Hospital Stay: Payer: Medicare Other | Admitting: Oncology

## 2015-07-12 DIAGNOSIS — Z510 Encounter for antineoplastic radiation therapy: Secondary | ICD-10-CM | POA: Diagnosis not present

## 2015-07-12 DIAGNOSIS — Z431 Encounter for attention to gastrostomy: Secondary | ICD-10-CM | POA: Diagnosis not present

## 2015-07-12 DIAGNOSIS — Z87891 Personal history of nicotine dependence: Secondary | ICD-10-CM | POA: Diagnosis not present

## 2015-07-12 DIAGNOSIS — K219 Gastro-esophageal reflux disease without esophagitis: Secondary | ICD-10-CM | POA: Diagnosis not present

## 2015-07-12 DIAGNOSIS — E43 Unspecified severe protein-calorie malnutrition: Secondary | ICD-10-CM | POA: Diagnosis not present

## 2015-07-12 DIAGNOSIS — C029 Malignant neoplasm of tongue, unspecified: Secondary | ICD-10-CM | POA: Diagnosis not present

## 2015-07-12 DIAGNOSIS — I10 Essential (primary) hypertension: Secondary | ICD-10-CM | POA: Diagnosis not present

## 2015-07-12 DIAGNOSIS — D649 Anemia, unspecified: Secondary | ICD-10-CM | POA: Diagnosis not present

## 2015-07-12 DIAGNOSIS — R1310 Dysphagia, unspecified: Secondary | ICD-10-CM | POA: Diagnosis not present

## 2015-07-13 ENCOUNTER — Inpatient Hospital Stay: Payer: Medicare Other

## 2015-07-13 DIAGNOSIS — C029 Malignant neoplasm of tongue, unspecified: Secondary | ICD-10-CM | POA: Diagnosis not present

## 2015-07-13 DIAGNOSIS — Z681 Body mass index (BMI) 19 or less, adult: Secondary | ICD-10-CM | POA: Diagnosis not present

## 2015-07-13 DIAGNOSIS — Z931 Gastrostomy status: Secondary | ICD-10-CM | POA: Diagnosis not present

## 2015-07-13 DIAGNOSIS — R1310 Dysphagia, unspecified: Secondary | ICD-10-CM | POA: Diagnosis not present

## 2015-07-13 DIAGNOSIS — K219 Gastro-esophageal reflux disease without esophagitis: Secondary | ICD-10-CM | POA: Diagnosis not present

## 2015-07-17 DIAGNOSIS — Z510 Encounter for antineoplastic radiation therapy: Secondary | ICD-10-CM | POA: Diagnosis not present

## 2015-07-17 DIAGNOSIS — C029 Malignant neoplasm of tongue, unspecified: Secondary | ICD-10-CM | POA: Diagnosis not present

## 2015-07-17 DIAGNOSIS — Z87891 Personal history of nicotine dependence: Secondary | ICD-10-CM | POA: Diagnosis not present

## 2015-07-17 DIAGNOSIS — C01 Malignant neoplasm of base of tongue: Secondary | ICD-10-CM | POA: Diagnosis not present

## 2015-07-18 DIAGNOSIS — K219 Gastro-esophageal reflux disease without esophagitis: Secondary | ICD-10-CM | POA: Diagnosis not present

## 2015-07-18 DIAGNOSIS — I10 Essential (primary) hypertension: Secondary | ICD-10-CM | POA: Diagnosis not present

## 2015-07-18 DIAGNOSIS — E43 Unspecified severe protein-calorie malnutrition: Secondary | ICD-10-CM | POA: Diagnosis not present

## 2015-07-18 DIAGNOSIS — D649 Anemia, unspecified: Secondary | ICD-10-CM | POA: Diagnosis not present

## 2015-07-18 DIAGNOSIS — R1310 Dysphagia, unspecified: Secondary | ICD-10-CM | POA: Diagnosis not present

## 2015-07-18 DIAGNOSIS — Z431 Encounter for attention to gastrostomy: Secondary | ICD-10-CM | POA: Diagnosis not present

## 2015-07-18 DIAGNOSIS — C029 Malignant neoplasm of tongue, unspecified: Secondary | ICD-10-CM | POA: Diagnosis not present

## 2015-07-19 ENCOUNTER — Inpatient Hospital Stay: Payer: Medicare Other | Attending: Oncology | Admitting: Oncology

## 2015-07-19 ENCOUNTER — Inpatient Hospital Stay: Payer: Medicare Other

## 2015-07-19 VITALS — BP 103/66 | HR 85 | Temp 96.1°F | Wt 140.0 lb

## 2015-07-19 DIAGNOSIS — Z5111 Encounter for antineoplastic chemotherapy: Secondary | ICD-10-CM | POA: Insufficient documentation

## 2015-07-19 DIAGNOSIS — C029 Malignant neoplasm of tongue, unspecified: Secondary | ICD-10-CM

## 2015-07-19 DIAGNOSIS — R1310 Dysphagia, unspecified: Secondary | ICD-10-CM | POA: Diagnosis not present

## 2015-07-19 DIAGNOSIS — I10 Essential (primary) hypertension: Secondary | ICD-10-CM | POA: Diagnosis not present

## 2015-07-19 DIAGNOSIS — Z79899 Other long term (current) drug therapy: Secondary | ICD-10-CM | POA: Diagnosis not present

## 2015-07-19 DIAGNOSIS — Z87898 Personal history of other specified conditions: Secondary | ICD-10-CM | POA: Insufficient documentation

## 2015-07-19 DIAGNOSIS — F329 Major depressive disorder, single episode, unspecified: Secondary | ICD-10-CM | POA: Insufficient documentation

## 2015-07-19 DIAGNOSIS — R634 Abnormal weight loss: Secondary | ICD-10-CM | POA: Diagnosis not present

## 2015-07-19 DIAGNOSIS — Z87891 Personal history of nicotine dependence: Secondary | ICD-10-CM | POA: Diagnosis not present

## 2015-07-19 DIAGNOSIS — E780 Pure hypercholesterolemia: Secondary | ICD-10-CM | POA: Insufficient documentation

## 2015-07-19 DIAGNOSIS — Z931 Gastrostomy status: Secondary | ICD-10-CM | POA: Diagnosis not present

## 2015-07-19 LAB — CBC WITH DIFFERENTIAL/PLATELET
BASOS PCT: 0 %
Basophils Absolute: 0 10*3/uL (ref 0–0.1)
EOS ABS: 0.1 10*3/uL (ref 0–0.7)
Eosinophils Relative: 1 %
HEMATOCRIT: 37.1 % — AB (ref 40.0–52.0)
Hemoglobin: 12.3 g/dL — ABNORMAL LOW (ref 13.0–18.0)
LYMPHS ABS: 1.8 10*3/uL (ref 1.0–3.6)
Lymphocytes Relative: 23 %
MCH: 32.8 pg (ref 26.0–34.0)
MCHC: 33.2 g/dL (ref 32.0–36.0)
MCV: 98.9 fL (ref 80.0–100.0)
MONO ABS: 0.8 10*3/uL (ref 0.2–1.0)
MONOS PCT: 10 %
Neutro Abs: 5.3 10*3/uL (ref 1.4–6.5)
Neutrophils Relative %: 66 %
Platelets: 439 10*3/uL (ref 150–440)
RBC: 3.75 MIL/uL — ABNORMAL LOW (ref 4.40–5.90)
RDW: 13.7 % (ref 11.5–14.5)
WBC: 8 10*3/uL (ref 3.8–10.6)

## 2015-07-19 LAB — BASIC METABOLIC PANEL
Anion gap: 8 (ref 5–15)
BUN: 15 mg/dL (ref 6–20)
CALCIUM: 9.5 mg/dL (ref 8.9–10.3)
CO2: 30 mmol/L (ref 22–32)
CREATININE: 0.9 mg/dL (ref 0.61–1.24)
Chloride: 95 mmol/L — ABNORMAL LOW (ref 101–111)
GFR calc non Af Amer: >60 mL/min (ref >60)
Glucose, Bld: 88 mg/dL (ref 65–99)
Potassium: 5 mmol/L (ref 3.5–5.1)
SODIUM: 133 mmol/L — AB (ref 135–145)

## 2015-07-19 MED ORDER — FENTANYL 25 MCG/HR TD PT72: 25.0000 ug | MEDICATED_PATCH | TRANSDERMAL | Status: DC

## 2015-07-19 NOTE — Progress Notes (Signed)
Patient does not have living will.  Former smoker. 

## 2015-07-20 ENCOUNTER — Encounter: Payer: Self-pay | Admitting: Oncology

## 2015-07-20 ENCOUNTER — Inpatient Hospital Stay: Payer: Medicare Other

## 2015-07-20 VITALS — BP 96/58 | HR 72 | Temp 97.0°F

## 2015-07-20 DIAGNOSIS — Z5111 Encounter for antineoplastic chemotherapy: Secondary | ICD-10-CM | POA: Diagnosis not present

## 2015-07-20 DIAGNOSIS — C029 Malignant neoplasm of tongue, unspecified: Secondary | ICD-10-CM | POA: Diagnosis not present

## 2015-07-20 DIAGNOSIS — Z87898 Personal history of other specified conditions: Secondary | ICD-10-CM | POA: Diagnosis not present

## 2015-07-20 DIAGNOSIS — Z931 Gastrostomy status: Secondary | ICD-10-CM | POA: Diagnosis not present

## 2015-07-20 DIAGNOSIS — I10 Essential (primary) hypertension: Secondary | ICD-10-CM | POA: Diagnosis not present

## 2015-07-20 DIAGNOSIS — F329 Major depressive disorder, single episode, unspecified: Secondary | ICD-10-CM | POA: Diagnosis not present

## 2015-07-20 DIAGNOSIS — Z87891 Personal history of nicotine dependence: Secondary | ICD-10-CM | POA: Diagnosis not present

## 2015-07-20 DIAGNOSIS — R1310 Dysphagia, unspecified: Secondary | ICD-10-CM | POA: Diagnosis not present

## 2015-07-20 DIAGNOSIS — R634 Abnormal weight loss: Secondary | ICD-10-CM | POA: Diagnosis not present

## 2015-07-20 DIAGNOSIS — Z79899 Other long term (current) drug therapy: Secondary | ICD-10-CM | POA: Diagnosis not present

## 2015-07-20 DIAGNOSIS — E780 Pure hypercholesterolemia: Secondary | ICD-10-CM | POA: Diagnosis not present

## 2015-07-20 MED ORDER — FOSAPREPITANT DIMEGLUMINE INJECTION 150 MG
Freq: Once | INTRAVENOUS | Status: AC
  Administered 2015-07-20: 12:00:00 via INTRAVENOUS
  Filled 2015-07-20: qty 5

## 2015-07-20 MED ORDER — SODIUM CHLORIDE 0.9 % IV SOLN
30.0000 mg/m2 | Freq: Once | INTRAVENOUS | Status: AC
  Administered 2015-07-20: 55 mg via INTRAVENOUS
  Filled 2015-07-20: qty 55

## 2015-07-20 MED ORDER — PALONOSETRON HCL INJECTION 0.25 MG/5ML
0.2500 mg | Freq: Once | INTRAVENOUS | Status: AC
  Administered 2015-07-20: 0.25 mg via INTRAVENOUS
  Filled 2015-07-20: qty 5

## 2015-07-20 MED ORDER — SODIUM CHLORIDE 0.9 % IV SOLN
Freq: Once | INTRAVENOUS | Status: AC
  Administered 2015-07-20: 09:00:00 via INTRAVENOUS
  Filled 2015-07-20: qty 1000

## 2015-07-20 MED ORDER — DEXTROSE-NACL 5-0.45 % IV SOLN
Freq: Once | INTRAVENOUS | Status: AC
  Administered 2015-07-20: 09:00:00 via INTRAVENOUS
  Filled 2015-07-20: qty 1000

## 2015-07-20 NOTE — Progress Notes (Signed)
[place] @ [facility] [phone]  Fax:([phone]) [place] OB: 07-19-1956  MR#: [id number]  GYF#:[id number]  Patient Care Team: Cyndi Bender, PA-C as PCP - General (Physician Assistant) Beverly Gust, MD (Unknown Physician Specialty)  CHIEF COMPLAINT:  Chief Complaint  Patient presents with  . Follow-up   1.  Has a history of cancer of the tongue in 2006.  Patient underwent resection followed by radiation therapy 2.  Increasing difficulty in swallowing for last 2 or 3 weeks.  Patient had upper endoscopy done in December of 2015. 3.  Significant weight loss 4.  Abnormal PET scan (August of 2016) 5.  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  (September 2 016) T3 N0 M0 tumor Status post PEG tube placement (September, 2016)  VISIT DIAGNOSIS:  Significant weight loss. Difficulty swallowing. History of carcinoma of tongue    Oncology Flowsheet 06/11/2015 06/26/2015 07/03/2015 07/04/2015 07/05/2015 07/20/2015  Day, Cycle - - - - - Day 1, Cycle 1  CISplatin (PLATINOL) IV - - - - - 30 mg/m2  dexamethasone (DECADRON) IJ - - - - - -  dexamethasone (DECADRON) IV 10 mg - - - - [ 12 mg ]  enoxaparin (LOVENOX) Joseph - - 40 mg 40 mg 40 mg -  fosaprepitant (EMEND) IV - - - - - [ 150 mg ]  LORazepam (ATIVAN) IV - - - 1 mg - -  ondansetron (ZOFRAN) IV - - 4 mg - - -  palonosetron (ALOXI) IV - - - - - 0.25 mg  promethazine (PHENERGAN) IM - - - 25 mg - -    INTERVAL HISTORY:  I received a phone call from ENT surgeon to see this 59 year old gentleman who had been seen by me several years ago.  Patient had a history of carcinoma of lung (exact state not known) old chart being reviewed.  At present old records are not available on EMR. According to patient he had been doing fairly good.  He quit smoking in November.  December had upper endoscopy because of difficulty swallowing.   No abnormality detected patient underwent esophageal dilation. Recently he has increasing difficulty in swallowing but that is in right at the level off larynx and beginning of esophagus. Has not lost significant amount of weight.  Soreness in the mouth.  Patient has been treated with antifungal antibiotics without much relief.  Patient has lost approximately 40-50 pounds of weight. Also has increasing cough.  Yellowish expectoration.  Patient had been a chronic smoker and quit smoking in November of 2015 June 18, 2015 shows pattern Patient is here for ongoing evaluation and treatment consideration.  Since last evaluation patient had a PET scan done.  Patient continues to have problems with pain and difficulty swallowing.  PET scan has been reviewed independently and shows abnormality on the left side of the base of the tongue extending all the way to epiglottis July 19, 2015 Patient came today further follow-up was hospitalized at a PEG tube placement.  Patient is extremely depressed.  Losing weight.  Patient is right now getting 6-8 cans of feeding.  Pain is somewhat better.  But continues to lose weight Starting radiation therapy on Monday (July 30, 2015)   REVIEW OF SYSTEMS:   Gen. status: Patient is feeling weak and tired.  Has lost significant weight Poor appetite patient is very depressed.  Losing weight. Increasing difficulty swallowing as mentioned in history  of present illness HEENT: As mentioned in history of present illness patient had history of carcinoma of tongue status post radiation therapy recently having increasing soreness in the mouth. Lungs: Increasing cough shortness of breath yellowish expectoration no fever no hemoptysis GI no nausea no vomiting no diarrhea GU no dysuria hematuria skin: No rash neurological system no tingling numbness.. [place] close skeletal system no bony pain Lower extremity no swelling Skin: No rash Abdomen: Had a PEG tube placement  As per  HPI. Otherwise, a complete review of systems is negatve.  PAST MEDICAL HISTORY: Carcinoma of tongue Hypertension Hypercholesterolemia Previous substance abuse Gastroesophageal reflux disease  PAST SURGICAL HISTORY: Treated for carcinoma of tongue FAMILY HISTORY There is no significant family history of breast cancer, ovarian cancer, colon cancer    ADVANCED DIRECTIVES:  Patient does not have any living will or healthcare power of attorney.  Information was given .  Available resources had been discussed.  We will follow-up on subsequent appointments regarding this issue  HEALTH MAINTENANCE: Social History  Substance Use Topics  . Smoking status: Former Smoker -- 1.00 packs/day for 0 years    Types: Cigarettes    Quit date: 09/21/2014  . Smokeless tobacco: None  . Alcohol Use: Yes     Comment: beer/wine every day    Quit smoking in November of 2015.  History of smoking for several years in the past.  No Known Allergies  Current Outpatient Prescriptions  Medication Sig Dispense Refill  . chlorhexidine (PERIDEX) 0.12 % solution 15 mLs by Mouth Rinse route 2 (two) times daily. 120 mL 0  . lisinopril (PRINIVIL,ZESTRIL) 10 MG tablet 10 mg once at bedtime    . omeprazole (PRILOSEC) 40 MG capsule 40 mg once in the morning    . Oxycodone HCl 20 MG TABS 30 mg every 6 (six) hours.    Marland Kitchen oxyCODONE-acetaminophen (PERCOCET) 7.5-325 MG per tablet Take 1 tablet by mouth every 4 (four) hours as needed for severe pain. 30 tablet 0  . phenol (CHLORASEPTIC) 1.4 % LIQD Use as directed 1 spray in the mouth or throat as needed for throat irritation / pain. 177 mL 0  . pravastatin (PRAVACHOL) 40 MG tablet 40 mg once bedtime    . Probiotic Product (PROBIOTIC DAILY) CAPS Take 1 capsule by mouth daily.     . traZODone (DESYREL) 50 MG tablet 50 mg nightly.    . fentaNYL (DURAGESIC - DOSED MCG/HR) 25 MCG/HR patch Place 1 patch (25 mcg total) onto the skin every 3 (three) days. 10 patch 0   No current  facility-administered medications for this visit.    OBJECTIVE: PHYSICAL EXAM: Gen. status: Patient is seen lean and cachectic. HEENT: No soreness in the mouth.  But swelling which is diffuse Lymphatic system: Supraclavicular, cervical, axillary, inguinal lymph nodes are not palpable Lungs: Bilateral rhonchi and occasional crepitation. Cardiac: Tachycardia Examination of the skin revealed no evidence of significant rashes, suspicious appearing nevi or other concerning lesions.. Abdominal exam revealed normal bowel sounds. The abdomen was soft, non-tender, and without masses, organomegaly, or appreciable enlargement of the abdominal aorta..   Peg  placement Psychiatric system: Depression and anxiety  Filed Vitals:   07/19/15 1212  BP: 103/66  Pulse: 85  Temp: 96.1 F (35.6 C)     Body mass index is 17.97 kg/(m^2).    ECOG FS:1 - Symptomatic but completely ambulatory  LAB RESULTS:  Appointment on 07/19/2015  Component Date Value Ref Range Status  . WBC 07/19/2015 8.0  3.8 -  10.6 K/uL Final  . RBC 07/19/2015 3.75* 4.40 - 5.90 MIL/uL Final  . Hemoglobin 07/19/2015 12.3* 13.0 - 18.0 g/dL Final  . HCT 07/19/2015 37.1* 40.0 - 52.0 % Final  . MCV 07/19/2015 98.9  80.0 - 100.0 fL Final  . MCH 07/19/2015 32.8  26.0 - 34.0 pg Final  . MCHC 07/19/2015 33.2  32.0 - 36.0 g/dL Final  . RDW 07/19/2015 13.7  11.5 - 14.5 % Final  . Platelets 07/19/2015 439  150 - 440 K/uL Final  . Neutrophils Relative % 07/19/2015 66   Final  . Neutro Abs 07/19/2015 5.3  1.4 - 6.5 K/uL Final  . Lymphocytes Relative 07/19/2015 23   Final  . Lymphs Abs 07/19/2015 1.8  1.0 - 3.6 K/uL Final  . Monocytes Relative 07/19/2015 10   Final  . Monocytes Absolute 07/19/2015 0.8  0.2 - 1.0 K/uL Final  . Eosinophils Relative 07/19/2015 1   Final  . Eosinophils Absolute 07/19/2015 0.1  0 - 0.7 K/uL Final  . Basophils Relative 07/19/2015 0   Final  . Basophils Absolute 07/19/2015 0.0  0 - 0.1 K/uL Final  . Sodium  07/19/2015 133* 135 - 145 mmol/L Final  . Potassium 07/19/2015 5.0  3.5 - 5.1 mmol/L Final  . Chloride 07/19/2015 95* 101 - 111 mmol/L Final  . CO2 07/19/2015 30  22 - 32 mmol/L Final  . Glucose, Bld 07/19/2015 88  65 - 99 mg/dL Final  . BUN 07/19/2015 15  6 - 20 mg/dL Final  . Creatinine, Ser 07/19/2015 0.90  0.61 - 1.24 mg/dL Final  . Calcium 07/19/2015 9.5  8.9 - 10.3 mg/dL Final  . GFR calc non Af Amer 07/19/2015 >60  >60 mL/min Final  . GFR calc Af Amer 07/19/2015 >60  >60 mL/min Final   Comment: (NOTE) The eGFR has been calculated using the CKD EPI equation. This calculation has not been validated in all clinical situations. eGFR's persistently <60 mL/min signify possible Chronic Kidney Disease.   . Anion gap 07/19/2015 8  5 - 15 Final       ASSESSMENT:  A. 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  Carcinoma of tongue in 2006 status post resection and radiation therapy exit staging not known and old records being off pain for review 2.  Difficulty in swallowing 2-3 weeks duration.  Had a normal upper endoscopy in December of 2015 with esophageal dilated Patient history of peptic ulcer disease 3, significant weight loss 4, previous history of chronic drug abuse.  Chronic tobacco abuse.    PLAN:   Patient had a PEG tube placement Continues to lose weight Consult dietitian to see if calorie skin be increased to more than 2000 2.  Depression HEENT there is no improvement may need antidepressant medication 3.  Recurrent carcinoma of tongue versus second primary.  Patient would start cis-platinum and radiation therapy Cis-platinum 30 mg/m 3 weeks one week off for total 6 cycles  Intent of chemotherapy is palliation and relief in symptoms and extending survival Patient was explained all the side effects of chemotherapy.   And informed consent has been obtained    No matching staging information was found for the patient.  Forest Gleason, MD   07/20/2015 3:56 PM

## 2015-07-23 ENCOUNTER — Ambulatory Visit: Payer: Medicare Other

## 2015-07-23 ENCOUNTER — Other Ambulatory Visit: Payer: Self-pay | Admitting: *Deleted

## 2015-07-23 ENCOUNTER — Ambulatory Visit
Admission: RE | Admit: 2015-07-23 | Discharge: 2015-07-23 | Disposition: A | Discharge status: Home / Self Care | Payer: Medicare Other | Source: Ambulatory Visit | Attending: Radiation Oncology | Admitting: Radiation Oncology

## 2015-07-23 ENCOUNTER — Telehealth: Payer: Self-pay | Admitting: *Deleted

## 2015-07-23 DIAGNOSIS — C029 Malignant neoplasm of tongue, unspecified: Secondary | ICD-10-CM

## 2015-07-23 MED ORDER — ONDANSETRON HCL 4 MG PO TABS: 4.0000 mg | ORAL_TABLET | ORAL | Status: DC | PRN

## 2015-07-23 NOTE — Telephone Encounter (Signed)
Had chemo end of last week and started vomiting Yesterday, he is still vomiting adn is also dizzy. He has been taking omeprazole which he thought is for nausea, states that is all he was given

## 2015-07-23 NOTE — Telephone Encounter (Signed)
Pt will be here at 9 for IVF prior to XRT tomorrow. Notified of rx being sent ot pharmacy  Spoke with wife as pt had gone to lie down

## 2015-07-24 ENCOUNTER — Ambulatory Visit: Payer: Medicare Other

## 2015-07-24 ENCOUNTER — Other Ambulatory Visit: Payer: Self-pay | Admitting: Vascular Surgery

## 2015-07-24 ENCOUNTER — Inpatient Hospital Stay: Payer: Medicare Other

## 2015-07-24 ENCOUNTER — Ambulatory Visit
Admission: RE | Admit: 2015-07-24 | Discharge: 2015-07-24 | Disposition: A | Discharge status: Home / Self Care | Payer: Medicare Other | Source: Ambulatory Visit | Attending: Radiation Oncology | Admitting: Radiation Oncology

## 2015-07-24 DIAGNOSIS — Z931 Gastrostomy status: Secondary | ICD-10-CM | POA: Diagnosis not present

## 2015-07-24 DIAGNOSIS — Z79899 Other long term (current) drug therapy: Secondary | ICD-10-CM | POA: Diagnosis not present

## 2015-07-24 DIAGNOSIS — Z5111 Encounter for antineoplastic chemotherapy: Secondary | ICD-10-CM | POA: Diagnosis not present

## 2015-07-24 DIAGNOSIS — C029 Malignant neoplasm of tongue, unspecified: Secondary | ICD-10-CM | POA: Diagnosis not present

## 2015-07-24 DIAGNOSIS — F329 Major depressive disorder, single episode, unspecified: Secondary | ICD-10-CM | POA: Diagnosis not present

## 2015-07-24 DIAGNOSIS — Z87898 Personal history of other specified conditions: Secondary | ICD-10-CM | POA: Diagnosis not present

## 2015-07-24 DIAGNOSIS — I10 Essential (primary) hypertension: Secondary | ICD-10-CM | POA: Diagnosis not present

## 2015-07-24 DIAGNOSIS — R1310 Dysphagia, unspecified: Secondary | ICD-10-CM | POA: Diagnosis not present

## 2015-07-24 DIAGNOSIS — Z510 Encounter for antineoplastic radiation therapy: Secondary | ICD-10-CM | POA: Diagnosis not present

## 2015-07-24 DIAGNOSIS — E780 Pure hypercholesterolemia: Secondary | ICD-10-CM | POA: Diagnosis not present

## 2015-07-24 DIAGNOSIS — R634 Abnormal weight loss: Secondary | ICD-10-CM | POA: Diagnosis not present

## 2015-07-24 DIAGNOSIS — Z87891 Personal history of nicotine dependence: Secondary | ICD-10-CM | POA: Diagnosis not present

## 2015-07-24 DIAGNOSIS — C01 Malignant neoplasm of base of tongue: Secondary | ICD-10-CM | POA: Diagnosis not present

## 2015-07-24 MED ORDER — SODIUM CHLORIDE 0.9 % IV SOLN
Freq: Once | INTRAVENOUS | Status: AC
  Administered 2015-07-24: 09:00:00 via INTRAVENOUS
  Filled 2015-07-24: qty 1000

## 2015-07-24 MED ORDER — SODIUM CHLORIDE 0.9 % IV SOLN
Freq: Once | INTRAVENOUS | Status: AC
  Administered 2015-07-24: 10:00:00 via INTRAVENOUS
  Filled 2015-07-24: qty 8

## 2015-07-25 ENCOUNTER — Ambulatory Visit
Admission: RE | Admit: 2015-07-25 | Discharge: 2015-07-25 | Disposition: A | Discharge status: Home / Self Care | Payer: Medicare Other | Source: Ambulatory Visit | Attending: Radiation Oncology | Admitting: Radiation Oncology

## 2015-07-25 DIAGNOSIS — C029 Malignant neoplasm of tongue, unspecified: Secondary | ICD-10-CM | POA: Diagnosis not present

## 2015-07-25 DIAGNOSIS — C01 Malignant neoplasm of base of tongue: Secondary | ICD-10-CM | POA: Diagnosis not present

## 2015-07-25 DIAGNOSIS — Z510 Encounter for antineoplastic radiation therapy: Secondary | ICD-10-CM | POA: Diagnosis not present

## 2015-07-25 DIAGNOSIS — Z87891 Personal history of nicotine dependence: Secondary | ICD-10-CM | POA: Diagnosis not present

## 2015-07-26 ENCOUNTER — Inpatient Hospital Stay: Payer: Medicare Other

## 2015-07-26 ENCOUNTER — Inpatient Hospital Stay (HOSPITAL_BASED_OUTPATIENT_CLINIC_OR_DEPARTMENT_OTHER): Payer: Medicare Other | Admitting: Oncology

## 2015-07-26 ENCOUNTER — Ambulatory Visit
Admission: RE | Admit: 2015-07-26 | Discharge: 2015-07-26 | Disposition: A | Discharge status: Home / Self Care | Payer: Medicare Other | Source: Ambulatory Visit | Attending: Radiation Oncology | Admitting: Radiation Oncology

## 2015-07-26 ENCOUNTER — Encounter: Payer: Self-pay | Admitting: Oncology

## 2015-07-26 VITALS — BP 96/58 | HR 84 | Temp 95.7°F | Resp 16 | Ht 74.0 in | Wt 145.5 lb

## 2015-07-26 DIAGNOSIS — Z5111 Encounter for antineoplastic chemotherapy: Secondary | ICD-10-CM | POA: Diagnosis not present

## 2015-07-26 DIAGNOSIS — Z931 Gastrostomy status: Secondary | ICD-10-CM | POA: Diagnosis not present

## 2015-07-26 DIAGNOSIS — Z510 Encounter for antineoplastic radiation therapy: Secondary | ICD-10-CM | POA: Diagnosis not present

## 2015-07-26 DIAGNOSIS — R1310 Dysphagia, unspecified: Secondary | ICD-10-CM

## 2015-07-26 DIAGNOSIS — C029 Malignant neoplasm of tongue, unspecified: Secondary | ICD-10-CM | POA: Diagnosis not present

## 2015-07-26 DIAGNOSIS — F329 Major depressive disorder, single episode, unspecified: Secondary | ICD-10-CM | POA: Diagnosis not present

## 2015-07-26 DIAGNOSIS — Z79899 Other long term (current) drug therapy: Secondary | ICD-10-CM | POA: Diagnosis not present

## 2015-07-26 DIAGNOSIS — Z87898 Personal history of other specified conditions: Secondary | ICD-10-CM | POA: Diagnosis not present

## 2015-07-26 DIAGNOSIS — Z87891 Personal history of nicotine dependence: Secondary | ICD-10-CM | POA: Diagnosis not present

## 2015-07-26 DIAGNOSIS — R634 Abnormal weight loss: Secondary | ICD-10-CM | POA: Diagnosis not present

## 2015-07-26 DIAGNOSIS — I10 Essential (primary) hypertension: Secondary | ICD-10-CM | POA: Diagnosis not present

## 2015-07-26 DIAGNOSIS — C01 Malignant neoplasm of base of tongue: Secondary | ICD-10-CM | POA: Diagnosis not present

## 2015-07-26 DIAGNOSIS — E780 Pure hypercholesterolemia: Secondary | ICD-10-CM | POA: Diagnosis not present

## 2015-07-26 LAB — CBC WITH DIFFERENTIAL/PLATELET
BASOS ABS: 0 10*3/uL (ref 0–0.1)
BASOS PCT: 0 %
EOS ABS: 0.1 10*3/uL (ref 0–0.7)
Eosinophils Relative: 1 %
HCT: 36.8 % — ABNORMAL LOW (ref 40.0–52.0)
HEMOGLOBIN: 12.4 g/dL — AB (ref 13.0–18.0)
Lymphocytes Relative: 25 %
Lymphs Abs: 2.4 10*3/uL (ref 1.0–3.6)
MCH: 32.9 pg (ref 26.0–34.0)
MCHC: 33.6 g/dL (ref 32.0–36.0)
MCV: 97.9 fL (ref 80.0–100.0)
Monocytes Absolute: 1 10*3/uL (ref 0.2–1.0)
Monocytes Relative: 11 %
NEUTROS PCT: 63 %
Neutro Abs: 6.1 10*3/uL (ref 1.4–6.5)
Platelets: 369 10*3/uL (ref 150–440)
RBC: 3.76 MIL/uL — AB (ref 4.40–5.90)
RDW: 13.3 % (ref 11.5–14.5)
WBC: 9.6 10*3/uL (ref 3.8–10.6)

## 2015-07-26 LAB — COMPREHENSIVE METABOLIC PANEL
ALBUMIN: 3.4 g/dL — AB (ref 3.5–5.0)
ALK PHOS: 56 U/L (ref 38–126)
ALT: 18 U/L (ref 17–63)
AST: 24 U/L (ref 15–41)
Anion gap: 6 (ref 5–15)
BUN: 16 mg/dL (ref 6–20)
CALCIUM: 8.7 mg/dL — AB (ref 8.9–10.3)
CHLORIDE: 97 mmol/L — AB (ref 101–111)
CO2: 28 mmol/L (ref 22–32)
CREATININE: 0.95 mg/dL (ref 0.61–1.24)
GFR calc Af Amer: >60 mL/min (ref >60)
GFR calc non Af Amer: >60 mL/min (ref >60)
GLUCOSE: 133 mg/dL — AB (ref 65–99)
Potassium: 4 mmol/L (ref 3.5–5.1)
SODIUM: 131 mmol/L — AB (ref 135–145)
Total Bilirubin: 0.3 mg/dL (ref 0.3–1.2)
Total Protein: 7 g/dL (ref 6.5–8.1)

## 2015-07-26 LAB — MAGNESIUM: Magnesium: 2 mg/dL (ref 1.7–2.4)

## 2015-07-26 MED ORDER — CITALOPRAM HYDROBROMIDE 20 MG PO TABS: 20.0000 mg | ORAL_TABLET | Freq: Every day | ORAL | Status: DC

## 2015-07-26 MED ORDER — FENTANYL 25 MCG/HR TD PT72: 50.0000 ug | MEDICATED_PATCH | TRANSDERMAL | Status: DC

## 2015-07-26 NOTE — Progress Notes (Signed)
[place] @ [facility] [phone]  Fax:([phone]) [place] OB: 29-Jul-1956  MR#: [id number]  IZT#:[id number]  Patient Care Team: Cyndi Bender, PA-C as PCP - General (Physician Assistant) Beverly Gust, MD (Unknown Physician Specialty)  CHIEF COMPLAINT:  Chief Complaint  Patient presents with  . Head/Neck cancer  . Pain in back and neck 9/10   1.  Has a history of cancer of the tongue in 2006.  Patient underwent resection followed by radiation therapy 2.  Increasing difficulty in swallowing for last 2 or 3 weeks.  Patient had upper endoscopy done in December of 2015. 3.  Significant weight loss 4.  Abnormal PET scan (August of 2016) 5.  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  (Cis-platinum and radiation therapy) (September 2 016) T3 N0 M0 tumor Status post PEG tube placement (September, 2016)  VISIT DIAGNOSIS:  Significant weight loss. Difficulty swallowing. History of carcinoma of tongue    Oncology Flowsheet 06/11/2015 06/26/2015 07/03/2015 07/04/2015 07/05/2015 07/20/2015 07/24/2015  Day, Cycle - - - - - Day 1, Cycle 1 -  CISplatin (PLATINOL) IV - - - - - 30 mg/m2 -  dexamethasone (DECADRON) IJ - - - - - - -  dexamethasone (DECADRON) IV 10 mg - - - - [ 12 mg ] [ 8 mg ]  enoxaparin (LOVENOX) [place] - - 40 mg 40 mg 40 mg - -  fosaprepitant (EMEND) IV - - - - - [ 150 mg ] -  LORazepam (ATIVAN) IV - - - 1 mg - - -  ondansetron (ZOFRAN) IV - - 4 mg - - - [ 16 mg ]  palonosetron (ALOXI) IV - - - - - 0.25 mg -  promethazine (PHENERGAN) IM - - - 25 mg - - -    INTERVAL HISTORY:  I received a phone call from ENT surgeon to see this 59 year old gentleman who had been seen by me several years ago.  Patient had a history of carcinoma of lung (exact state not known) old chart being reviewed.  At present old records are not available on EMR. According to patient he had  been doing fairly good.  He quit smoking in November.  December had upper endoscopy because of difficulty swallowing.  No abnormality detected patient underwent esophageal dilation. Recently he has increasing difficulty in swallowing but that is in right at the level off larynx and beginning of esophagus. Has not lost significant amount of weight.  Soreness in the mouth.  Patient has been treated with antifungal antibiotics without much relief.  Patient has lost approximately 40-50 pounds of weight. Also has increasing cough.  Yellowish expectoration.  Patient had been a chronic smoker and quit smoking in November of 2015 June 18, 2015 shows pattern Patient is here for ongoing evaluation and treatment consideration.  Since last evaluation patient had a PET scan done.  Patient continues to have problems with pain and difficulty swallowing.  PET scan has been reviewed independently and shows abnormality on the left side of the base of the tongue extending all the way to epiglottis July 19, 2015 Patient came today further follow-up was hospitalized at a PEG tube placement.  Patient is extremely depressed.  Losing weight.  Patient is right now getting 6-8 cans of feeding.  Pain is somewhat better.  But continues to lose weight Starting radiation therapy on Monday (July 30, 2015) July 26, 2015 Patient had a  severe nausea and received interim period of IV fluid and Zofran.  Nausea improved however patient continues to be very depressed.  Seeing dietitian tomorrow getting port placed tomorrow.  Continuing radiation therapy. Patient and family and number of questions regarding type of cancer and other issues Patient did gain 5 pounds of weight since last evaluation.   REVIEW OF SYSTEMS:   Gen. status: Patient is feeling weak and tired.  Has lost significant weight Poor appetite patient is very depressed.  Losing weight. Increasing difficulty swallowing as mentioned in history of present  illness HEENT: As mentioned in history of present illness patient had history of carcinoma of tongue status post radiation therapy recently having increasing soreness in the mouth. Lungs: Increasing cough shortness of breath yellowish expectoration no fever no hemoptysis GI no nausea no vomiting no diarrhea GU no dysuria hematuria skin: No rash neurological system no tingling numbness.. [place] close skeletal system no bony pain Lower extremity no swelling Skin: No rash Abdomen: Had a PEG tube placement  As per HPI. Otherwise, a complete review of systems is negatve.  PAST MEDICAL HISTORY: Carcinoma of tongue Hypertension Hypercholesterolemia Previous substance abuse Gastroesophageal reflux disease  PAST SURGICAL HISTORY: Treated for carcinoma of tongue FAMILY HISTORY There is no significant family history of breast cancer, ovarian cancer, colon cancer    ADVANCED DIRECTIVES:  Patient does not have any living will or healthcare power of attorney.  Information was given .  Available resources had been discussed.  We will follow-up on subsequent appointments regarding this issue  HEALTH MAINTENANCE: Social History  Substance Use Topics  . Smoking status: Former Smoker -- 1.00 packs/day for 0 years    Types: Cigarettes    Quit date: 09/21/2014  . Smokeless tobacco: None  . Alcohol Use: Yes     Comment: beer/wine every day    Quit smoking in November of 2015.  History of smoking for several years in the past.  No Known Allergies  Current Outpatient Prescriptions  Medication Sig Dispense Refill  . chlorhexidine (PERIDEX) 0.12 % solution 15 mLs by Mouth Rinse route 2 (two) times daily. 120 mL 0  . fentaNYL (DURAGESIC - DOSED MCG/HR) 25 MCG/HR patch Place 2 patches (50 mcg total) onto the skin every 3 (three) days. 10 patch 0  . lisinopril (PRINIVIL,ZESTRIL) 10 MG tablet 10 mg once at bedtime    . omeprazole (PRILOSEC) 40 MG capsule 40 mg once in the morning    . ondansetron  (ZOFRAN) 4 MG tablet Take 1 tablet (4 mg total) by mouth every 4 (four) hours as needed for nausea or vomiting. 45 tablet 2  . Oxycodone HCl 20 MG TABS 30 mg every 6 (six) hours.    . phenol (CHLORASEPTIC) 1.4 % LIQD Use as directed 1 spray in the mouth or throat as needed for throat irritation / pain. 177 mL 0  . pravastatin (PRAVACHOL) 40 MG tablet 40 mg once bedtime    . Probiotic Product (PROBIOTIC DAILY) CAPS Take 1 capsule by mouth daily.     . traZODone (DESYREL) 50 MG tablet 50 mg nightly.    . citalopram (CELEXA) 20 MG tablet Take 1 tablet (20 mg total) by mouth daily. 30 tablet 3  . oxyCODONE-acetaminophen (PERCOCET) 7.5-325 MG per tablet Take 1 tablet by mouth every 4 (four) hours as needed for severe pain. (Patient not taking: Reported on 07/26/2015) 30 tablet 0   No current facility-administered medications for this visit.    OBJECTIVE: PHYSICAL EXAM: Gen. status:  Patient is seen lean and cachectic. HEENT: No soreness in the mouth.  But swelling which is diffuse Lymphatic system: Supraclavicular, cervical, axillary, inguinal lymph nodes are not palpable Lungs: Bilateral rhonchi and occasional crepitation. Cardiac: Tachycardia Examination of the skin revealed no evidence of significant rashes, suspicious appearing nevi or other concerning lesions.. Abdominal exam revealed normal bowel sounds. The abdomen was soft, non-tender, and without masses, organomegaly, or appreciable enlargement of the abdominal aorta..   Peg  placement Psychiatric system: Depression and anxiety  Filed Vitals:   07/26/15 1113  BP: 96/58  Pulse: 84  Temp: 95.7 F (35.4 C)  Resp: 16     Body mass index is 18.67 kg/(m^2).    ECOG FS:1 - Symptomatic but completely ambulatory  LAB RESULTS:  Appointment on 07/26/2015  Component Date Value Ref Range Status  . WBC 07/26/2015 9.6  3.8 - 10.6 K/uL Final  . RBC 07/26/2015 3.76* 4.40 - 5.90 MIL/uL Final  . Hemoglobin 07/26/2015 12.4* 13.0 - 18.0 g/dL  Final  . HCT 07/26/2015 36.8* 40.0 - 52.0 % Final  . MCV 07/26/2015 97.9  80.0 - 100.0 fL Final  . MCH 07/26/2015 32.9  26.0 - 34.0 pg Final  . MCHC 07/26/2015 33.6  32.0 - 36.0 g/dL Final  . RDW 07/26/2015 13.3  11.5 - 14.5 % Final  . Platelets 07/26/2015 369  150 - 440 K/uL Final  . Neutrophils Relative % 07/26/2015 63   Final  . Neutro Abs 07/26/2015 6.1  1.4 - 6.5 K/uL Final  . Lymphocytes Relative 07/26/2015 25   Final  . Lymphs Abs 07/26/2015 2.4  1.0 - 3.6 K/uL Final  . Monocytes Relative 07/26/2015 11   Final  . Monocytes Absolute 07/26/2015 1.0  0.2 - 1.0 K/uL Final  . Eosinophils Relative 07/26/2015 1   Final  . Eosinophils Absolute 07/26/2015 0.1  0 - 0.7 K/uL Final  . Basophils Relative 07/26/2015 0   Final  . Basophils Absolute 07/26/2015 0.0  0 - 0.1 K/uL Final  . Sodium 07/26/2015 131* 135 - 145 mmol/L Final  . Potassium 07/26/2015 4.0  3.5 - 5.1 mmol/L Final  . Chloride 07/26/2015 97* 101 - 111 mmol/L Final  . CO2 07/26/2015 28  22 - 32 mmol/L Final  . Glucose, Bld 07/26/2015 133* 65 - 99 mg/dL Final  . BUN 07/26/2015 16  6 - 20 mg/dL Final  . Creatinine, Ser 07/26/2015 0.95  0.61 - 1.24 mg/dL Final  . Calcium 07/26/2015 8.7* 8.9 - 10.3 mg/dL Final  . Total Protein 07/26/2015 7.0  6.5 - 8.1 g/dL Final  . Albumin 07/26/2015 3.4* 3.5 - 5.0 g/dL Final  . AST 07/26/2015 24  15 - 41 U/L Final  . ALT 07/26/2015 18  17 - 63 U/L Final  . Alkaline Phosphatase 07/26/2015 56  38 - 126 U/L Final  . Total Bilirubin 07/26/2015 0.3  0.3 - 1.2 mg/dL Final  . GFR calc non Af Amer 07/26/2015 >60  >60 mL/min Final  . GFR calc Af Amer 07/26/2015 >60  >60 mL/min Final   Comment: (NOTE) The eGFR has been calculated using the CKD EPI equation. This calculation has not been validated in all clinical situations. eGFR's persistently <60 mL/min signify possible Chronic Kidney Disease.   . Anion gap 07/26/2015 6  5 - 15 Final  . Magnesium 07/26/2015 2.0  1.7 - 2.4 mg/dL Final        ASSESSMENT:  A. 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  Carcinoma of tongue in 2006 status post resection and radiation therapy exit staging not known and old records being off pain for review 2.  Difficulty in swallowing 2-3 weeks duration.  Had a normal upper endoscopy in December of 2015 with esophageal dilated Patient history of peptic ulcer disease 3, significant weight loss 4, previous history of chronic drug abuse.  Chronic tobacco abuse.    PLAN:   Patient had a PEG tube placement Continues to lose weight Consult dietitian to see if calorie skin be increased to more than 2000 2.  Depression HEENT there is no improvement may need antidepressant medication 3.  Recurrent carcinoma of tongue versus second primary.  Patient would start cis-platinum and radiation therapy Cis-platinum 30 mg/m 3 weeks one week off for total 6 cycles. ADDCelexa.  20 mg daily Continue chemotherapy with cis-platinum Patient will continue to get interim hydration Consult with dietitian pending Port placement pending tomorrow     No matching staging information was found for the patient.  Forest Gleason, MD   07/26/2015 11:44 AM

## 2015-07-27 ENCOUNTER — Ambulatory Visit
Admission: RE | Admit: 2015-07-27 | Discharge: 2015-07-27 | Disposition: A | Discharge status: Home / Self Care | Payer: Medicare Other | Source: Ambulatory Visit | Attending: Radiation Oncology | Admitting: Radiation Oncology

## 2015-07-27 ENCOUNTER — Encounter: Payer: Medicare Other | Attending: Physician Assistant | Admitting: Dietician

## 2015-07-27 ENCOUNTER — Encounter
Admission: RE | Disposition: A | Discharge status: Home / Self Care | Payer: Self-pay | Source: Ambulatory Visit | Attending: Vascular Surgery

## 2015-07-27 ENCOUNTER — Encounter: Payer: Self-pay | Admitting: *Deleted

## 2015-07-27 ENCOUNTER — Ambulatory Visit
Admission: RE | Admit: 2015-07-27 | Discharge: 2015-07-27 | Disposition: A | Discharge status: Home / Self Care | Payer: Medicare Other | Source: Ambulatory Visit | Attending: Vascular Surgery | Admitting: Vascular Surgery

## 2015-07-27 VITALS — Ht 74.0 in | Wt 141.8 lb

## 2015-07-27 DIAGNOSIS — C029 Malignant neoplasm of tongue, unspecified: Secondary | ICD-10-CM | POA: Insufficient documentation

## 2015-07-27 DIAGNOSIS — F329 Major depressive disorder, single episode, unspecified: Secondary | ICD-10-CM | POA: Insufficient documentation

## 2015-07-27 DIAGNOSIS — Z87891 Personal history of nicotine dependence: Secondary | ICD-10-CM | POA: Diagnosis not present

## 2015-07-27 DIAGNOSIS — Z79899 Other long term (current) drug therapy: Secondary | ICD-10-CM | POA: Insufficient documentation

## 2015-07-27 DIAGNOSIS — C01 Malignant neoplasm of base of tongue: Secondary | ICD-10-CM | POA: Diagnosis not present

## 2015-07-27 DIAGNOSIS — Z510 Encounter for antineoplastic radiation therapy: Secondary | ICD-10-CM | POA: Diagnosis not present

## 2015-07-27 DIAGNOSIS — C801 Malignant (primary) neoplasm, unspecified: Secondary | ICD-10-CM

## 2015-07-27 HISTORY — PX: PERIPHERAL VASCULAR CATHETERIZATION: SHX172C

## 2015-07-27 SURGERY — PORTA CATH INSERTION
Anesthesia: Moderate Sedation

## 2015-07-27 MED ORDER — FENTANYL CITRATE (PF) 100 MCG/2ML IJ SOLN
INTRAMUSCULAR | Status: AC
  Filled 2015-07-27: qty 2

## 2015-07-27 MED ORDER — SODIUM CHLORIDE 0.9 % IV SOLN: Freq: Once | INTRAVENOUS | Status: DC

## 2015-07-27 MED ORDER — FENTANYL CITRATE (PF) 100 MCG/2ML IJ SOLN
INTRAMUSCULAR | Status: DC | PRN
  Administered 2015-07-27: 25 ug via INTRAVENOUS
  Administered 2015-07-27: 50 ug via INTRAVENOUS
  Administered 2015-07-27: 25 ug via INTRAVENOUS

## 2015-07-27 MED ORDER — MIDAZOLAM HCL 5 MG/5ML IJ SOLN
INTRAMUSCULAR | Status: AC
  Filled 2015-07-27: qty 5

## 2015-07-27 MED ORDER — DEXTROSE 5 % IV SOLN
INTRAVENOUS | Status: AC
  Filled 2015-07-27: qty 1.5

## 2015-07-27 MED ORDER — LIDOCAINE-EPINEPHRINE (PF) 1 %-1:200000 IJ SOLN
INTRAMUSCULAR | Status: AC
  Filled 2015-07-27: qty 30

## 2015-07-27 MED ORDER — MIDAZOLAM HCL 2 MG/2ML IJ SOLN
INTRAMUSCULAR | Status: DC | PRN
  Administered 2015-07-27: 1 mg via INTRAVENOUS
  Administered 2015-07-27: 2 mg via INTRAVENOUS
  Administered 2015-07-27: 1 mg via INTRAVENOUS

## 2015-07-27 MED ORDER — CHLORHEXIDINE GLUCONATE CLOTH 2 % EX PADS: 6.0000 | MEDICATED_PAD | Freq: Once | CUTANEOUS | Status: DC

## 2015-07-27 MED ORDER — DEXTROSE 5 % IV SOLN: 1.5000 g | INTRAVENOUS | Status: DC

## 2015-07-27 MED ORDER — SODIUM CHLORIDE 0.9 % IV SOLN
INTRAVENOUS | Status: DC
  Administered 2015-07-27: 14:00:00 via INTRAVENOUS

## 2015-07-27 SURGICAL SUPPLY — 3 items
PACK ANGIOGRAPHY (CUSTOM PROCEDURE TRAY) ×3 IMPLANT
PORTACATH POWER 8F (Port) ×3 IMPLANT
TOWEL OR 17X26 4PK STRL BLUE (TOWEL DISPOSABLE) ×3 IMPLANT

## 2015-07-27 NOTE — Discharge Instructions (Signed)

## 2015-07-27 NOTE — Op Note (Signed)
OPERATIVE NOTE   PROCEDURE: 1. Placement of a right IJ Infuse-a-Port  PRE-OPERATIVE DIAGNOSIS: Carcinoma of the tongue  POST-OPERATIVE DIAGNOSIS: Same  SURGEON: Katha Cabal M.D.  ANESTHESIA: Conscious sedation combined with 1% lidocaine with epinephrine  ESTIMATED BLOOD LOSS: Minimal   FINDING(S): 1.  Patent vein  SPECIMEN(S): None  INDICATIONS:   Grant Lee is a 59 y.o. male who presents with carcinoma of the tongue. He will undergo chemotherapy and therefore needs appropriate IV access. Infuse-a-Port is being placed.  DESCRIPTION: After obtaining full informed written consent, the patient was brought back to the special procedure suite and placed in the supine position. The patient's right neck and chest wall are prepped and draped in sterile fashion. Appropriate timeout was called.  Ultrasound is placed in a sterile sleeve, ultrasound is utilized to avoid vascular injury as well as secondary to lack of appropriate landmarks. The internal jugular vein on the right is identified. It is echolucent and homogeneous as well as easily compressible indicating patency. 1% lidocaine is infiltrated into the soft tissue at the base of the neck as well as on the chest wall.  Under direct ultrasound visualization Seldinger needle is inserted into the internal jugular vein. J-wire is advanced under fluoroscopic guidance. A small counterincision was created at the wire insertion site. A transverse incision is created 2 fingerbreadths below the scapula and a pocket is fashioned using both blunt and sharp dissection. The pocket is tested for appropriate size with the hub of the Infuse-a-Port. The tunneling device is then used to pull the intravascular portion of the catheter from the pocket to the neck counterincision.  Dilator and peel-away sheath were then inserted over the wire and the wire is removed. Catheter is then advanced into the venous system without difficulty. Peel-away  sheath was then removed.  Catheter is then positioned under fluoroscopic guidance at the atrial caval junction. It is then transected connected to the hub and the hope is slipped into the subcutaneous pocket on the chest wall. The hub was then accessed percutaneously and aspirates easily and flushes well and is flushed with 30 cc of heparinized saline. The pocket incision is then closed in layers using interrupted 3-0 Vicryl for the subcutaneous tissues and 4-0 Monocryl subcuticular for skin closure. Dermabond is applied. The neck counterincision was closed with 4-0 Monocryl subcuticular and Dermabond as well.  The patient tolerated the procedure well and there were no immediate complications.  COMPLICATIONS: None  CONDITION: Unchanged  Katha Cabal M.D. [facility] Office: [phone]   07/27/2015, 4:38 PM

## 2015-07-27 NOTE — H&P (Signed)
[facility] History & Physical Update  The patient was interviewed and re-examined.  The patient's previous History and Physical has been reviewed and is unchanged.  There is no change in the plan of care. We plan to proceed with the scheduled procedure.  Schnier, Dolores Lory, MD  07/27/2015, 4:37 PM

## 2015-07-27 NOTE — Progress Notes (Signed)
Medical Nutrition Therapy: Visit start time: 0915  end time: 1000  Assessment:  Diagnosis: cancer, squamous cell; currently receiving radiation and chemotherapy Past medical history: HTN hyperlipidemia Psychosocial issues/ stress concerns: patient reports moderate stress level Preferred learning method:  . Auditory  Current weight: 141.8lbs  Height: 6'2" Medications, supplements: listed in chart Progress and evaluation: Patient reports cancer is in his throat, and he has had pain when swallowing. He is able to drink water, and voices desire to eat solid food.         He reports weight loss of about 40lbs in recent weeks.         He has had some nausea, but is taking anti-nausea medication and has also started an appetite stimulant.          He has not taken in any food by mouth in 4-5 weeks, is using Jevity 1.5 in feeding tube.  Physical activity: none  Dietary Intake:  Jevity 1.5 6 cans daily via feeding tube. Daily nutrition: 2130kcal, 90.6g protein, 306.6g CHO, 70.8g fat Nutrition Care Education: Topics covered: liquid and soft diet Basic nutrition: importance of protein sources and some fruit and/or vegetable intake Cancer/throat discomfort: encouraged eating at regular intervals, cool-temperature foods, avoidance of "roughage" fiber, acidic, and spicy foods.              Nutritional Diagnosis:  [place]-3.2 Unintentional weight loss As related to cancer--throat discomfort, some nausea.  As evidenced by patient report.  Intervention: Discussion as noted above.   Patient will begin following liquid and/or soft diet.    Will plan to contact patient by phone in 2-3 weeks to monitor progress.    Offered further RD consult if needed.   Education Materials given:  Marland Kitchen Full liquid diet, Mechanical soft diet (NCDA)  Learner/ who was taught:  . Patient  . Spouse/ partner  Level of understanding: Marland Kitchen Verbalizes/ demonstrates competency  Demonstrated degree of understanding via:   Teach  back Learning barriers: . None  Willingness to learn/ readiness for change: . Eager, change in progress  Monitoring and Evaluation:  Dietary intake and body weight      follow up: prn

## 2015-07-27 NOTE — Patient Instructions (Signed)
   Try some milkshakes or smoothies that have a protein source such as milk, yogurt, protein powder, or peanut butter, along with fruit.   Plan to eat a small meal or snack every 3-4 hours; Jevity counts as a full meal.   Aim for cool or cold foods to ease throat discomfort.  Use liquid, pureed, or soft foods to eat. Avoid acidic foods, hot foods, peppery foods, high fiber foods.

## 2015-07-30 ENCOUNTER — Ambulatory Visit
Admission: RE | Admit: 2015-07-30 | Discharge: 2015-07-30 | Disposition: A | Discharge status: Home / Self Care | Payer: Medicare Other | Source: Ambulatory Visit | Attending: Radiation Oncology | Admitting: Radiation Oncology

## 2015-07-30 ENCOUNTER — Ambulatory Visit: Payer: Medicare Other

## 2015-07-30 ENCOUNTER — Inpatient Hospital Stay: Payer: Medicare Other

## 2015-07-30 VITALS — BP 102/70 | HR 68 | Temp 96.0°F | Resp 16

## 2015-07-30 DIAGNOSIS — Z87898 Personal history of other specified conditions: Secondary | ICD-10-CM | POA: Diagnosis not present

## 2015-07-30 DIAGNOSIS — C01 Malignant neoplasm of base of tongue: Secondary | ICD-10-CM | POA: Diagnosis not present

## 2015-07-30 DIAGNOSIS — Z79899 Other long term (current) drug therapy: Secondary | ICD-10-CM | POA: Diagnosis not present

## 2015-07-30 DIAGNOSIS — R634 Abnormal weight loss: Secondary | ICD-10-CM | POA: Diagnosis not present

## 2015-07-30 DIAGNOSIS — C029 Malignant neoplasm of tongue, unspecified: Secondary | ICD-10-CM | POA: Diagnosis not present

## 2015-07-30 DIAGNOSIS — F329 Major depressive disorder, single episode, unspecified: Secondary | ICD-10-CM | POA: Diagnosis not present

## 2015-07-30 DIAGNOSIS — Z510 Encounter for antineoplastic radiation therapy: Secondary | ICD-10-CM | POA: Diagnosis not present

## 2015-07-30 DIAGNOSIS — Z87891 Personal history of nicotine dependence: Secondary | ICD-10-CM | POA: Diagnosis not present

## 2015-07-30 DIAGNOSIS — I10 Essential (primary) hypertension: Secondary | ICD-10-CM | POA: Diagnosis not present

## 2015-07-30 DIAGNOSIS — Z5111 Encounter for antineoplastic chemotherapy: Secondary | ICD-10-CM | POA: Diagnosis not present

## 2015-07-30 DIAGNOSIS — R1310 Dysphagia, unspecified: Secondary | ICD-10-CM | POA: Diagnosis not present

## 2015-07-30 DIAGNOSIS — Z931 Gastrostomy status: Secondary | ICD-10-CM | POA: Diagnosis not present

## 2015-07-30 DIAGNOSIS — E780 Pure hypercholesterolemia: Secondary | ICD-10-CM | POA: Diagnosis not present

## 2015-07-30 MED ORDER — SODIUM CHLORIDE 0.9 % IV SOLN
30.0000 mg/m2 | Freq: Once | INTRAVENOUS | Status: AC
  Administered 2015-07-30: 55 mg via INTRAVENOUS
  Filled 2015-07-30: qty 55

## 2015-07-30 MED ORDER — SODIUM CHLORIDE 0.9 % IJ SOLN
10.0000 mL | INTRAMUSCULAR | Status: DC | PRN
  Administered 2015-07-30: 10 mL
  Filled 2015-07-30: qty 10

## 2015-07-30 MED ORDER — PALONOSETRON HCL INJECTION 0.25 MG/5ML
0.2500 mg | Freq: Once | INTRAVENOUS | Status: AC
  Administered 2015-07-30: 0.25 mg via INTRAVENOUS
  Filled 2015-07-30: qty 5

## 2015-07-30 MED ORDER — SODIUM CHLORIDE 0.9 % IV SOLN
Freq: Once | INTRAVENOUS | Status: AC
  Administered 2015-07-30: 10:00:00 via INTRAVENOUS
  Filled 2015-07-30: qty 1000

## 2015-07-30 MED ORDER — SODIUM CHLORIDE 0.9 % IV SOLN
Freq: Once | INTRAVENOUS | Status: AC
  Administered 2015-07-30: 12:00:00 via INTRAVENOUS
  Filled 2015-07-30: qty 5

## 2015-07-30 MED ORDER — HEPARIN SOD (PORK) LOCK FLUSH 100 UNIT/ML IV SOLN
500.0000 [IU] | Freq: Once | INTRAVENOUS | Status: AC | PRN
  Administered 2015-07-30: 500 [IU]
  Filled 2015-07-30: qty 5

## 2015-07-30 MED ORDER — POTASSIUM CHLORIDE 2 MEQ/ML IV SOLN
Freq: Once | INTRAVENOUS | Status: AC
  Administered 2015-07-30: 10:00:00 via INTRAVENOUS
  Filled 2015-07-30: qty 1000

## 2015-07-31 ENCOUNTER — Ambulatory Visit: Payer: Medicare Other

## 2015-08-01 ENCOUNTER — Inpatient Hospital Stay: Payer: Medicare Other

## 2015-08-01 ENCOUNTER — Ambulatory Visit
Admission: RE | Admit: 2015-08-01 | Discharge: 2015-08-01 | Disposition: A | Discharge status: Home / Self Care | Payer: Medicare Other | Source: Ambulatory Visit | Attending: Radiation Oncology | Admitting: Radiation Oncology

## 2015-08-01 VITALS — BP 110/72 | HR 64 | Temp 96.1°F | Resp 18

## 2015-08-01 DIAGNOSIS — Z87898 Personal history of other specified conditions: Secondary | ICD-10-CM | POA: Diagnosis not present

## 2015-08-01 DIAGNOSIS — I10 Essential (primary) hypertension: Secondary | ICD-10-CM | POA: Diagnosis not present

## 2015-08-01 DIAGNOSIS — Z510 Encounter for antineoplastic radiation therapy: Secondary | ICD-10-CM | POA: Diagnosis not present

## 2015-08-01 DIAGNOSIS — C01 Malignant neoplasm of base of tongue: Secondary | ICD-10-CM | POA: Diagnosis not present

## 2015-08-01 DIAGNOSIS — Z5111 Encounter for antineoplastic chemotherapy: Secondary | ICD-10-CM | POA: Diagnosis not present

## 2015-08-01 DIAGNOSIS — R1310 Dysphagia, unspecified: Secondary | ICD-10-CM | POA: Diagnosis not present

## 2015-08-01 DIAGNOSIS — Z931 Gastrostomy status: Secondary | ICD-10-CM | POA: Diagnosis not present

## 2015-08-01 DIAGNOSIS — F329 Major depressive disorder, single episode, unspecified: Secondary | ICD-10-CM | POA: Diagnosis not present

## 2015-08-01 DIAGNOSIS — C029 Malignant neoplasm of tongue, unspecified: Secondary | ICD-10-CM

## 2015-08-01 DIAGNOSIS — Z87891 Personal history of nicotine dependence: Secondary | ICD-10-CM | POA: Diagnosis not present

## 2015-08-01 DIAGNOSIS — R634 Abnormal weight loss: Secondary | ICD-10-CM | POA: Diagnosis not present

## 2015-08-01 DIAGNOSIS — Z79899 Other long term (current) drug therapy: Secondary | ICD-10-CM | POA: Diagnosis not present

## 2015-08-01 DIAGNOSIS — E780 Pure hypercholesterolemia: Secondary | ICD-10-CM | POA: Diagnosis not present

## 2015-08-01 MED ORDER — SODIUM CHLORIDE 0.9 % IV SOLN
Freq: Once | INTRAVENOUS | Status: AC
  Administered 2015-08-01: 12:00:00 via INTRAVENOUS
  Filled 2015-08-01: qty 1000

## 2015-08-01 MED ORDER — LIDOCAINE-PRILOCAINE 2.5-2.5 % EX CREA: 1.0000 "application " | TOPICAL_CREAM | CUTANEOUS | Status: DC | PRN

## 2015-08-01 MED ORDER — HEPARIN SOD (PORK) LOCK FLUSH 100 UNIT/ML IV SOLN
500.0000 [IU] | Freq: Once | INTRAVENOUS | Status: AC
  Administered 2015-08-01: 500 [IU] via INTRAVENOUS

## 2015-08-01 MED ORDER — SODIUM CHLORIDE 0.9 % IV SOLN
Freq: Once | INTRAVENOUS | Status: AC
  Administered 2015-08-01: 12:00:00 via INTRAVENOUS
  Filled 2015-08-01: qty 4

## 2015-08-01 MED ORDER — HEPARIN SOD (PORK) LOCK FLUSH 100 UNIT/ML IV SOLN
INTRAVENOUS | Status: AC
  Filled 2015-08-01: qty 5

## 2015-08-01 MED ORDER — SODIUM CHLORIDE 0.9 % IJ SOLN
10.0000 mL | INTRAMUSCULAR | Status: DC | PRN
  Filled 2015-08-01: qty 10

## 2015-08-02 ENCOUNTER — Ambulatory Visit: Payer: Medicare Other

## 2015-08-03 ENCOUNTER — Emergency Department: Payer: Medicare Other

## 2015-08-03 ENCOUNTER — Inpatient Hospital Stay
Admission: EM | Admit: 2015-08-03 | Discharge: 2015-08-09 | DRG: 392 | Disposition: A | Discharge status: Home / Self Care | Payer: Medicare Other | Attending: Internal Medicine | Admitting: Internal Medicine

## 2015-08-03 ENCOUNTER — Ambulatory Visit: Payer: Medicare Other

## 2015-08-03 ENCOUNTER — Encounter: Payer: Self-pay | Admitting: Emergency Medicine

## 2015-08-03 DIAGNOSIS — B965 Pseudomonas (aeruginosa) (mallei) (pseudomallei) as the cause of diseases classified elsewhere: Secondary | ICD-10-CM | POA: Diagnosis present

## 2015-08-03 DIAGNOSIS — I781 Nevus, non-neoplastic: Secondary | ICD-10-CM | POA: Diagnosis present

## 2015-08-03 DIAGNOSIS — R1110 Vomiting, unspecified: Secondary | ICD-10-CM | POA: Diagnosis not present

## 2015-08-03 DIAGNOSIS — Z87891 Personal history of nicotine dependence: Secondary | ICD-10-CM | POA: Diagnosis not present

## 2015-08-03 DIAGNOSIS — K2980 Duodenitis without bleeding: Secondary | ICD-10-CM | POA: Diagnosis present

## 2015-08-03 DIAGNOSIS — K319 Disease of stomach and duodenum, unspecified: Secondary | ICD-10-CM | POA: Diagnosis not present

## 2015-08-03 DIAGNOSIS — Z79899 Other long term (current) drug therapy: Secondary | ICD-10-CM | POA: Diagnosis not present

## 2015-08-03 DIAGNOSIS — K2210 Ulcer of esophagus without bleeding: Secondary | ICD-10-CM | POA: Diagnosis not present

## 2015-08-03 DIAGNOSIS — N390 Urinary tract infection, site not specified: Secondary | ICD-10-CM | POA: Diagnosis not present

## 2015-08-03 DIAGNOSIS — Z931 Gastrostomy status: Secondary | ICD-10-CM | POA: Diagnosis not present

## 2015-08-03 DIAGNOSIS — C140 Malignant neoplasm of pharynx, unspecified: Secondary | ICD-10-CM | POA: Diagnosis not present

## 2015-08-03 DIAGNOSIS — G8929 Other chronic pain: Secondary | ICD-10-CM | POA: Diagnosis present

## 2015-08-03 DIAGNOSIS — Z8711 Personal history of peptic ulcer disease: Secondary | ICD-10-CM

## 2015-08-03 DIAGNOSIS — I10 Essential (primary) hypertension: Secondary | ICD-10-CM | POA: Diagnosis not present

## 2015-08-03 DIAGNOSIS — K2970 Gastritis, unspecified, without bleeding: Secondary | ICD-10-CM | POA: Diagnosis not present

## 2015-08-03 DIAGNOSIS — C069 Malignant neoplasm of mouth, unspecified: Secondary | ICD-10-CM | POA: Diagnosis present

## 2015-08-03 DIAGNOSIS — K269 Duodenal ulcer, unspecified as acute or chronic, without hemorrhage or perforation: Secondary | ICD-10-CM | POA: Diagnosis present

## 2015-08-03 DIAGNOSIS — D649 Anemia, unspecified: Secondary | ICD-10-CM | POA: Diagnosis present

## 2015-08-03 DIAGNOSIS — Z431 Encounter for attention to gastrostomy: Secondary | ICD-10-CM

## 2015-08-03 DIAGNOSIS — R112 Nausea with vomiting, unspecified: Principal | ICD-10-CM | POA: Diagnosis present

## 2015-08-03 DIAGNOSIS — J439 Emphysema, unspecified: Secondary | ICD-10-CM | POA: Diagnosis present

## 2015-08-03 DIAGNOSIS — F329 Major depressive disorder, single episode, unspecified: Secondary | ICD-10-CM | POA: Diagnosis present

## 2015-08-03 DIAGNOSIS — R1310 Dysphagia, unspecified: Secondary | ICD-10-CM | POA: Diagnosis not present

## 2015-08-03 DIAGNOSIS — K279 Peptic ulcer, site unspecified, unspecified as acute or chronic, without hemorrhage or perforation: Secondary | ICD-10-CM | POA: Diagnosis present

## 2015-08-03 DIAGNOSIS — C029 Malignant neoplasm of tongue, unspecified: Secondary | ICD-10-CM | POA: Diagnosis not present

## 2015-08-03 DIAGNOSIS — E860 Dehydration: Secondary | ICD-10-CM | POA: Diagnosis present

## 2015-08-03 DIAGNOSIS — R634 Abnormal weight loss: Secondary | ICD-10-CM | POA: Diagnosis not present

## 2015-08-03 DIAGNOSIS — K210 Gastro-esophageal reflux disease with esophagitis: Secondary | ICD-10-CM | POA: Diagnosis not present

## 2015-08-03 DIAGNOSIS — Z681 Body mass index (BMI) 19 or less, adult: Secondary | ICD-10-CM | POA: Diagnosis not present

## 2015-08-03 DIAGNOSIS — X58XXXA Exposure to other specified factors, initial encounter: Secondary | ICD-10-CM | POA: Diagnosis present

## 2015-08-03 DIAGNOSIS — Z96641 Presence of right artificial hip joint: Secondary | ICD-10-CM | POA: Diagnosis present

## 2015-08-03 DIAGNOSIS — K209 Esophagitis, unspecified without bleeding: Secondary | ICD-10-CM | POA: Diagnosis present

## 2015-08-03 DIAGNOSIS — M879 Osteonecrosis, unspecified: Secondary | ICD-10-CM | POA: Diagnosis not present

## 2015-08-03 DIAGNOSIS — E785 Hyperlipidemia, unspecified: Secondary | ICD-10-CM | POA: Diagnosis not present

## 2015-08-03 DIAGNOSIS — K208 Other esophagitis: Secondary | ICD-10-CM | POA: Diagnosis not present

## 2015-08-03 DIAGNOSIS — K259 Gastric ulcer, unspecified as acute or chronic, without hemorrhage or perforation: Secondary | ICD-10-CM | POA: Diagnosis present

## 2015-08-03 DIAGNOSIS — T66XXXA Radiation sickness, unspecified, initial encounter: Secondary | ICD-10-CM | POA: Diagnosis not present

## 2015-08-03 DIAGNOSIS — M1990 Unspecified osteoarthritis, unspecified site: Secondary | ICD-10-CM | POA: Diagnosis present

## 2015-08-03 DIAGNOSIS — K5900 Constipation, unspecified: Secondary | ICD-10-CM | POA: Diagnosis not present

## 2015-08-03 DIAGNOSIS — Z85810 Personal history of malignant neoplasm of tongue: Secondary | ICD-10-CM

## 2015-08-03 DIAGNOSIS — T451X5A Adverse effect of antineoplastic and immunosuppressive drugs, initial encounter: Secondary | ICD-10-CM | POA: Diagnosis not present

## 2015-08-03 DIAGNOSIS — G894 Chronic pain syndrome: Secondary | ICD-10-CM | POA: Diagnosis present

## 2015-08-03 DIAGNOSIS — E780 Pure hypercholesterolemia: Secondary | ICD-10-CM | POA: Diagnosis present

## 2015-08-03 DIAGNOSIS — Z79891 Long term (current) use of opiate analgesic: Secondary | ICD-10-CM

## 2015-08-03 DIAGNOSIS — E876 Hypokalemia: Secondary | ICD-10-CM | POA: Diagnosis not present

## 2015-08-03 DIAGNOSIS — M6281 Muscle weakness (generalized): Secondary | ICD-10-CM | POA: Diagnosis not present

## 2015-08-03 DIAGNOSIS — R531 Weakness: Secondary | ICD-10-CM

## 2015-08-03 LAB — COMPREHENSIVE METABOLIC PANEL WITH GFR
ALT: 12 U/L — ABNORMAL LOW (ref 17–63)
AST: 21 U/L (ref 15–41)
Albumin: 2.9 g/dL — ABNORMAL LOW (ref 3.5–5.0)
Alkaline Phosphatase: 76 U/L (ref 38–126)
Anion gap: 9 (ref 5–15)
BUN: 21 mg/dL — ABNORMAL HIGH (ref 6–20)
CO2: 27 mmol/L (ref 22–32)
Calcium: 8.4 mg/dL — ABNORMAL LOW (ref 8.9–10.3)
Chloride: 94 mmol/L — ABNORMAL LOW (ref 101–111)
Creatinine, Ser: 0.67 mg/dL (ref 0.61–1.24)
GFR calc Af Amer: >60 mL/min
GFR calc non Af Amer: >60 mL/min
Glucose, Bld: 89 mg/dL (ref 65–99)
Potassium: 3.4 mmol/L — ABNORMAL LOW (ref 3.5–5.1)
Sodium: 130 mmol/L — ABNORMAL LOW (ref 135–145)
Total Bilirubin: 1 mg/dL (ref 0.3–1.2)
Total Protein: 6.3 g/dL — ABNORMAL LOW (ref 6.5–8.1)

## 2015-08-03 LAB — URINALYSIS COMPLETE WITH MICROSCOPIC (ARMC ONLY)
Bacteria, UA: NONE SEEN
Bilirubin Urine: NEGATIVE
Glucose, UA: NEGATIVE mg/dL
Hgb urine dipstick: NEGATIVE
Nitrite: NEGATIVE
Protein, ur: 30 mg/dL — AB
Specific Gravity, Urine: 1.02 (ref 1.005–1.030)
pH: 7 (ref 5.0–8.0)

## 2015-08-03 LAB — CBC WITH DIFFERENTIAL/PLATELET
Basophils Absolute: 0 10*3/uL (ref 0–0.1)
Basophils Relative: 0 %
Eosinophils Absolute: 0.1 10*3/uL (ref 0–0.7)
Eosinophils Relative: 1 %
HCT: 30.2 % — ABNORMAL LOW (ref 40.0–52.0)
Hemoglobin: 10.5 g/dL — ABNORMAL LOW (ref 13.0–18.0)
Lymphocytes Relative: 13 %
Lymphs Abs: 1.4 10*3/uL (ref 1.0–3.6)
MCH: 33.2 pg (ref 26.0–34.0)
MCHC: 34.7 g/dL (ref 32.0–36.0)
MCV: 95.9 fL (ref 80.0–100.0)
Monocytes Absolute: 0.7 10*3/uL (ref 0.2–1.0)
Monocytes Relative: 7 %
Neutro Abs: 8.4 10*3/uL — ABNORMAL HIGH (ref 1.4–6.5)
Neutrophils Relative %: 79 %
Platelets: 204 10*3/uL (ref 150–440)
RBC: 3.15 MIL/uL — ABNORMAL LOW (ref 4.40–5.90)
RDW: 13.1 % (ref 11.5–14.5)
WBC: 10.6 10*3/uL (ref 3.8–10.6)

## 2015-08-03 LAB — LIPASE, BLOOD: Lipase: 126 U/L — ABNORMAL HIGH (ref 22–51)

## 2015-08-03 MED ORDER — METOCLOPRAMIDE HCL 5 MG/ML IJ SOLN
10.0000 mg | Freq: Once | INTRAMUSCULAR | Status: DC
Start: 2015-08-03 — End: 2015-08-03
  Filled 2015-08-03: qty 2

## 2015-08-03 MED ORDER — PRAVASTATIN SODIUM 20 MG PO TABS
40.0000 mg | ORAL_TABLET | Freq: Every day | ORAL | Status: DC
  Administered 2015-08-04 – 2015-08-08 (×3): 40 mg
  Filled 2015-08-03: qty 2
  Filled 2015-08-03: qty 1
  Filled 2015-08-03 (×2): qty 2
  Filled 2015-08-03: qty 1

## 2015-08-03 MED ORDER — METOCLOPRAMIDE HCL 5 MG/ML IJ SOLN
10.0000 mg | Freq: Four times a day (QID) | INTRAMUSCULAR | Status: DC
  Administered 2015-08-03 – 2015-08-09 (×22): 10 mg via INTRAVENOUS
  Filled 2015-08-03 (×22): qty 2

## 2015-08-03 MED ORDER — HYDROMORPHONE HCL 1 MG/ML IJ SOLN
1.0000 mg | Freq: Once | INTRAMUSCULAR | Status: AC
  Administered 2015-08-03: 1 mg via INTRAVENOUS
  Filled 2015-08-03: qty 1

## 2015-08-03 MED ORDER — OXYCODONE HCL 5 MG PO TABS
30.0000 mg | ORAL_TABLET | ORAL | Status: DC | PRN
  Administered 2015-08-03 – 2015-08-09 (×21): 30 mg
  Filled 2015-08-03 (×21): qty 6

## 2015-08-03 MED ORDER — PROMETHAZINE HCL 25 MG/ML IJ SOLN
25.0000 mg | Freq: Three times a day (TID) | INTRAMUSCULAR | Status: DC | PRN
  Administered 2015-08-04 – 2015-08-08 (×4): 25 mg via INTRAVENOUS
  Filled 2015-08-03 (×4): qty 1

## 2015-08-03 MED ORDER — ONDANSETRON HCL 4 MG/2ML IJ SOLN
4.0000 mg | Freq: Once | INTRAMUSCULAR | Status: AC
  Administered 2015-08-03: 4 mg via INTRAVENOUS
  Filled 2015-08-03: qty 2

## 2015-08-03 MED ORDER — ONDANSETRON HCL 4 MG/2ML IJ SOLN
4.0000 mg | Freq: Four times a day (QID) | INTRAMUSCULAR | Status: DC
  Administered 2015-08-03 – 2015-08-09 (×22): 4 mg via INTRAVENOUS
  Filled 2015-08-03 (×22): qty 2

## 2015-08-03 MED ORDER — LISINOPRIL 10 MG PO TABS
10.0000 mg | ORAL_TABLET | Freq: Every day | ORAL | Status: DC
  Administered 2015-08-03 – 2015-08-09 (×5): 10 mg
  Filled 2015-08-03 (×5): qty 1

## 2015-08-03 MED ORDER — MORPHINE SULFATE (PF) 2 MG/ML IV SOLN
2.0000 mg | INTRAVENOUS | Status: DC | PRN
  Administered 2015-08-04 – 2015-08-05 (×3): 2 mg via INTRAVENOUS
  Filled 2015-08-03 (×4): qty 1

## 2015-08-03 MED ORDER — HYDROMORPHONE HCL 1 MG/ML IJ SOLN
INTRAMUSCULAR | Status: AC
  Administered 2015-08-03: 1 mg via INTRAVENOUS
  Filled 2015-08-03: qty 1

## 2015-08-03 MED ORDER — CITALOPRAM HYDROBROMIDE 20 MG PO TABS
20.0000 mg | ORAL_TABLET | Freq: Every day | ORAL | Status: DC
  Administered 2015-08-03 – 2015-08-09 (×5): 20 mg
  Filled 2015-08-03 (×5): qty 1

## 2015-08-03 MED ORDER — SODIUM CHLORIDE 0.9 % IV BOLUS (SEPSIS)
1000.0000 mL | Freq: Once | INTRAVENOUS | Status: AC
  Administered 2015-08-03: 1000 mL via INTRAVENOUS

## 2015-08-03 MED ORDER — METOCLOPRAMIDE HCL 5 MG/ML IJ SOLN
10.0000 mg | Freq: Once | INTRAMUSCULAR | Status: AC
  Administered 2015-08-03: 10 mg via INTRAVENOUS

## 2015-08-03 MED ORDER — TRAZODONE HCL 100 MG PO TABS
100.0000 mg | ORAL_TABLET | Freq: Every day | ORAL | Status: DC
  Administered 2015-08-03 – 2015-08-08 (×5): 100 mg
  Filled 2015-08-03 (×6): qty 1

## 2015-08-03 MED ORDER — HYDROMORPHONE HCL 1 MG/ML IJ SOLN
1.0000 mg | Freq: Once | INTRAMUSCULAR | Status: AC
  Administered 2015-08-03: 1 mg via INTRAVENOUS

## 2015-08-03 MED ORDER — INFLUENZA VAC SPLIT QUAD 0.5 ML IM SUSY: 0.5000 mL | PREFILLED_SYRINGE | INTRAMUSCULAR | Status: DC

## 2015-08-03 MED ORDER — POTASSIUM CHLORIDE IN NACL 20-0.9 MEQ/L-% IV SOLN
INTRAVENOUS | Status: DC
  Administered 2015-08-03 – 2015-08-08 (×10): via INTRAVENOUS
  Filled 2015-08-03 (×15): qty 1000

## 2015-08-03 MED ORDER — GRANISETRON HCL 1 MG PO TABS
1.0000 mg | ORAL_TABLET | Freq: Once | ORAL | Status: AC
  Administered 2015-08-03: 1 mg via ORAL
  Filled 2015-08-03 (×2): qty 1

## 2015-08-03 MED ORDER — DEXTROSE 5 % IV SOLN
1.0000 g | INTRAVENOUS | Status: DC
  Administered 2015-08-03 – 2015-08-05 (×3): 1 g via INTRAVENOUS
  Filled 2015-08-03 (×4): qty 10

## 2015-08-03 MED ORDER — ENOXAPARIN SODIUM 40 MG/0.4ML ~~LOC~~ SOLN
40.0000 mg | SUBCUTANEOUS | Status: DC
  Administered 2015-08-03 – 2015-08-08 (×6): 40 mg via SUBCUTANEOUS
  Filled 2015-08-03 (×6): qty 0.4

## 2015-08-03 NOTE — ED Provider Notes (Signed)
[facility] Emergency Department Provider Note  ____________________________________________  Time seen: 1038  I have reviewed the triage vital signs and the nursing notes.   HISTORY  Chief Complaint Fatigue  nausea, vomiting, myalgia    HPI Grant Lee is a 59 y.o. male with a throat cancer who is undergoing treatment at [facility]. He began chemotherapy a week and a half ago and was doing okay until this past Monday. On Monday he began to feel nauseous. He has had intermittent episodes of vomiting. The patient does have himself G-tube feeds at home. He has slow this down because when he gives him himself the tube feed he has thrown up, however, he reports he throws up even when he does not give himself a tube feed. There does not seem to be a relationship between his 2 feet and emesis. He reports the emesis does not look like his tube feed diet.  In addition, he has a history of chronic pain and is on fairly high-dose oxycodone (30 mg every 4 hours). He is complaining of pain down to the right side.  He feels generally weak and fatigued. He called the cancer center who advised him to come to the emergency department.     Past Medical History  Diagnosis Date  . Hypertension   . Pneumonia     2004  . GERD (gastroesophageal reflux disease)   . Arthritis   . Anemia     blood transfusion 2012  . Bleeding ulcer   . Machinery accident     LEG INJURY  . Cancer     mouth 2006 then again 2016    Patient Active Problem List   Diagnosis Date Noted  . Chemotherapy induced nausea and vomiting 08/03/2015  . Cancer of tongue   . Protein-calorie malnutrition, severe 07/05/2015  . Malnutrition 07/03/2015  . Aspiration pneumonitis 07/03/2015  . DU (duodenal ulcer) 06/11/2015  . Tongue cancer 06/11/2015  . Abnormal loss of weight 10/09/2014  . Can't get food down 10/09/2014    Past Surgical History  Procedure Laterality Date   . Leg surgery    . Joint replacement      right hip  . Tongue biopsy    . Tongue surgery      multiple surgeries  . Direct laryngoscopy N/A 06/26/2015    Procedure: Laryngoscopy with tongue base biopsy ;  Surgeon: Beverly Gust, MD;  Location: [facility] ORS;  Service: ENT;  Laterality: N/A;  . Esophagogastroduodenoscopy N/A 07/06/2015    Procedure: ESOPHAGOGASTRODUODENOSCOPY (EGD) and percutaneous gastrostomy tyube placement;  Surgeon: Lollie Sails, MD;  Location: [facility] ENDOSCOPY;  Service: Endoscopy;  Laterality: N/A;  Residual need to be done in the afternoon on 07/06/2015. Combined procedure with Dr. Gustavo Lah and Dr Rayann Heman    No current outpatient prescriptions on file.  Allergies Review of patient's allergies indicates no known allergies.  Family History  Problem Relation Age of Onset  . CAD Mother   . CAD Father     Social History Social History  Substance Use Topics  . Smoking status: Former Smoker -- 1.00 packs/day for 0 years    Types: Cigarettes    Quit date: 09/21/2014  . Smokeless tobacco: None  . Alcohol Use: Yes     Comment: beer/wine every day    Review of Systems  Constitutional: Negative for fever. ENT: Tongue and throat cancer. Cardiovascular: Negative for chest pain. Respiratory: Negative for shortness of breath. Gastrointestinal: Nausea vomiting along with abdominal  pain. Patient does take a tube feeds. Genitourinary: Positive for dysuria, burning. Musculoskeletal: History of chronic pain. Ongoing pain down right side currently. Skin: Negative for rash. Neurological: Negative for headaches   10-point ROS otherwise negative.  ____________________________________________   PHYSICAL EXAM:  VITAL SIGNS: ED Triage Vitals  Enc Vitals Group     BP --      Pulse --      Resp --      Temp --      Temp src --      SpO2 --      Weight --      Height --      Head Cir --      Peak Flow --      Pain Score --      Pain Loc --      Pain Edu? --       Excl. in [place]? --     Constitutional:  Alert and oriented. Patient has some hiccups and appears uncomfortable. He appears slightly checked it and weak. ENT   Head: Normocephalic and atraumatic.   Nose: No congestion/rhinnorhea.   Mouth/Throat: Abnormal tongue with multiple bumps and lesions. Cardiovascular: Normal rate, regular rhythm, no murmur noted Respiratory:  Normal respiratory effort, no tachypnea.    Breath sounds are clear and equal bilaterally.  Gastrointestinal: G-tube in place. Abdomen is soft, but with noted tenderness on palpation.  Back: No muscle spasm, no tenderness, no CVA tenderness. Musculoskeletal: No deformity noted. Nontender with normal range of motion in all extremities.  No noted edema. Neurologic:  Normal speech and language. No gross focal neurologic deficits are appreciated.  Skin:  Skin is warm, dry. No rash noted. Psychiatric: Mood and affect are normal. Speech and behavior are normal.  ____________________________________________    LABS (pertinent positives/negatives)  Labs Reviewed  COMPREHENSIVE METABOLIC PANEL - Abnormal; Notable for the following:    Sodium 130 (*)    Potassium 3.4 (*)    Chloride 94 (*)    BUN 21 (*)    Calcium 8.4 (*)    Total Protein 6.3 (*)    Albumin 2.9 (*)    ALT 12 (*)    All other components within normal limits  CBC WITH DIFFERENTIAL/PLATELET - Abnormal; Notable for the following:    RBC 3.15 (*)    Hemoglobin 10.5 (*)    HCT 30.2 (*)    Neutro Abs 8.4 (*)    All other components within normal limits  URINALYSIS COMPLETEWITH MICROSCOPIC ([facility] ONLY) - Abnormal; Notable for the following:    Color, Urine YELLOW (*)    APPearance HAZY (*)    Ketones, ur 2+ (*)    Protein, ur 30 (*)    Leukocytes, UA 3+ (*)    Squamous Epithelial / LPF 0-5 (*)    All other components within normal limits  LIPASE, BLOOD - Abnormal; Notable for the following:    Lipase 126 (*)    All other components within normal  limits     ____________________________________________   EKG   ____________________________________________    RADIOLOGY  Abdominal series with chest x-ray IMPRESSION: No acute finding chest or abdomen.  Emphysema.  Avascular necrosis left hip.  ____________________________________________   INITIAL IMPRESSION / ASSESSMENT AND PLAN / ED COURSE  Pertinent labs & imaging results that were available during my care of the patient were reviewed by me and considered in my medical decision making (see chart for details).  Patient with general weakness, nausea,  and vomiting. He has a G-tube in place. He has some abdominal discomfort. We will check an x-ray of both his chest and abdomen to be sure he does not have signs of an obstruction.  ----------------------------------------- 12:24 PM on 08/03/2015 -----------------------------------------  X-ray is negative for obstruction. He does have signs of emphysema but no acute changes on his chest x-ray.  At this time, after Dilaudid and Zofran, the patient reports he is still feeling quite sick and he does not feel well enough to go home. We'll treat him with Reglan for his nausea to see if this helps. If it does not help, we will seek admission the hospital for ongoing treatment.  ----------------------------------------- 1:54 PM on 08/03/2015 -----------------------------------------  Patient with ongoing nausea and feels weak.  We will admit him to the hospital for his refractory symptoms. This is been discussed with Dr. Earleen Newport. ____________________________________________   FINAL CLINICAL IMPRESSION(S) / ED DIAGNOSES  Final diagnoses:  Refractory nausea and vomiting  General weakness   throat and mouth cancer    Ahmed Prima, MD 08/03/15 1553

## 2015-08-03 NOTE — Consult Note (Signed)
[facility]  [phone] [phone]  ID: Grant Lee OB: 1956/04/10  MR#: [id number]  QIO#:[id number]  Patient Care Team: Cyndi Bender, PA-C as PCP - General (Physician Assistant) Beverly Gust, MD (Unknown Physician Specialty)  CHIEF COMPLAINT:  Chief Complaint  Patient presents with  . Fatigue    INTERVAL HISTORY: Patient is a 59 year old male with known recurrence of the head and neck cancer currently undergoing concurrent chemotherapy with cisplatin as well as radiation. He also has a PEG tube in place.  Patient was tolerating his treatments well until approximately 2 days ago when he became increasingly nauseous and was unable to tolerate food either by mouth or by his PEG. He denies any fevers. He denies any pain. He has no neurologic complaints. He denies any chest pain or shortness of breath. He denies any diarrhea or constipation. Patient feels generally terrible, but offers no further specific complaints.  REVIEW OF SYSTEMS:   Review of Systems  Constitutional: Positive for weight loss and malaise/fatigue. Negative for fever.  HENT: Positive for sore throat.   Respiratory: Negative.   Cardiovascular: Negative.   Gastrointestinal: Positive for nausea and vomiting. Negative for diarrhea and constipation.  Musculoskeletal: Negative.   Neurological: Positive for weakness.    As per HPI. Otherwise, a complete review of systems is negatve.  PAST MEDICAL HISTORY: Past Medical History  Diagnosis Date  . Hypertension   . Pneumonia     2004  . GERD (gastroesophageal reflux disease)   . Arthritis   . Anemia     blood transfusion 2012  . Bleeding ulcer   . Machinery accident     LEG INJURY  . Cancer     mouth 2006 then again 2016    PAST SURGICAL HISTORY: Past Surgical History  Procedure Laterality Date  . Leg surgery    . Joint replacement      right hip  . Tongue biopsy    . Tongue surgery      multiple  surgeries  . Direct laryngoscopy N/A 06/26/2015    Procedure: Laryngoscopy with tongue base biopsy ;  Surgeon: Beverly Gust, MD;  Location: [facility] ORS;  Service: ENT;  Laterality: N/A;  . Esophagogastroduodenoscopy N/A 07/06/2015    Procedure: ESOPHAGOGASTRODUODENOSCOPY (EGD) and percutaneous gastrostomy tyube placement;  Surgeon: Lollie Sails, MD;  Location: [facility] ENDOSCOPY;  Service: Endoscopy;  Laterality: N/A;  Residual need to be done in the afternoon on 07/06/2015. Combined procedure with Dr. Gustavo Lah and Dr Rayann Heman    FAMILY HISTORY Family History  Problem Relation Age of Onset  . CAD Mother   . CAD Father        ADVANCED DIRECTIVES:    HEALTH MAINTENANCE: Social History  Substance Use Topics  . Smoking status: Former Smoker -- 1.00 packs/day for 0 years    Types: Cigarettes    Quit date: 09/21/2014  . Smokeless tobacco: None  . Alcohol Use: Yes     Comment: beer/wine every day     Colonoscopy:  PAP:  Bone density:  Lipid panel:  No Known Allergies  Current Facility-Administered Medications  Medication Dose Route Frequency Provider Last Rate Last Dose  . 0.9 % NaCl with KCl 20 mEq/ L  infusion   Intravenous Continuous Loletha Grayer, MD      . citalopram (CELEXA) tablet 20 mg  20 mg Per Tube Daily Loletha Grayer, MD      . enoxaparin (LOVENOX) injection 40 mg  40 mg Subcutaneous Q24H Loletha Grayer, MD      .  granisetron (KYTRIL) tablet 1 mg  1 mg Oral Once Loletha Grayer, MD      . Derrill Memo ON 08/04/2015] Influenza vac split quadrivalent PF (FLUARIX) injection 0.5 mL  0.5 mL Intramuscular Tomorrow-1000 Loletha Grayer, MD      . lisinopril (PRINIVIL,ZESTRIL) tablet 10 mg  10 mg Per Tube Daily Loletha Grayer, MD      . morphine 2 MG/ML injection 2 mg  2 mg Intravenous Q4H PRN Loletha Grayer, MD      . ondansetron [facility]) injection 4 mg  4 mg Intravenous 4 times per day Loletha Grayer, MD      . oxyCODONE (Oxy IR/ROXICODONE) immediate release tablet 30 mg  30  mg Per Tube Q4H PRN Loletha Grayer, MD      . pravastatin (PRAVACHOL) tablet 40 mg  40 mg Per Tube q1800 Loletha Grayer, MD      . promethazine (PHENERGAN) injection 25 mg  25 mg Intravenous Q8H PRN Loletha Grayer, MD      . traZODone (DESYREL) tablet 100 mg  100 mg Per Tube QHS Loletha Grayer, MD        OBJECTIVE: Filed Vitals:   08/03/15 1518  BP: 165/91  Pulse: 77  Temp: 100.1 F (37.8 C)  Resp: 20     Body mass index is 17.2 kg/(m^2).    ECOG FS:2 - Symptomatic, <50% confined to bed  General: Well-developed, well-nourished, no acute distress. Eyes: Pink conjunctiva, anicteric sclera. HEENT: Oropharynx is erythematous without exudate. Lungs: Clear to auscultation bilaterally. Heart: Regular rate and rhythm. No rubs, murmurs, or gallops. Abdomen: Soft, nontender, nondistended. No organomegaly noted, normoactive bowel sounds. PEG tube noted. Musculoskeletal: No edema, cyanosis, or clubbing. Neuro: Alert, answering all questions appropriately. Cranial nerves grossly intact. Skin: No rashes or petechiae noted. Psych: Normal affect.   LAB RESULTS:  Lab Results  Component Value Date   NA 130* 08/03/2015   K 3.4* 08/03/2015   CL 94* 08/03/2015   CO2 27 08/03/2015   GLUCOSE 89 08/03/2015   BUN 21* 08/03/2015   CREATININE 0.67 08/03/2015   CALCIUM 8.4* 08/03/2015   PROT 6.3* 08/03/2015   ALBUMIN 2.9* 08/03/2015   AST 21 08/03/2015   ALT 12* 08/03/2015   ALKPHOS 76 08/03/2015   BILITOT 1.0 08/03/2015   GFRNONAA >60 08/03/2015   GFRAA >60 08/03/2015    Lab Results  Component Value Date   WBC 10.6 08/03/2015   NEUTROABS 8.4* 08/03/2015   HGB 10.5* 08/03/2015   HCT 30.2* 08/03/2015   MCV 95.9 08/03/2015   PLT 204 08/03/2015     STUDIES: Ct Abdomen Pelvis W Contrast  07/10/2015   CLINICAL DATA:  Head and neck cancer. Weight loss. Percutaneous gastrostomy tube.  EXAM: CT ABDOMEN AND PELVIS WITH CONTRAST  TECHNIQUE: Multidetector CT imaging of the abdomen and  pelvis was performed using the standard protocol following bolus administration of intravenous contrast.  CONTRAST:  41m OMNIPAQUE IOHEXOL 300 MG/ML  SOLN  COMPARISON:  PET-CT 06/14/1999 16  FINDINGS: Lower chest: Bibasilar linear scarring at the lung bases. No airspace disease to suggest active infection. Mild bronchiectasis the lung bases.  Hepatobiliary: No focal hepatic lesion. No biliary duct dilatation. Gallbladder is normal. Common bile duct is normal.  Pancreas: Pancreas is normal. No ductal dilatation. No pancreatic inflammation.  Spleen: Normal spleen  Adrenals/urinary tract: Adrenal glands and kidneys are normal. The ureters and bladder normal.  Stomach/Bowel: Percutaneous gastrostomy tube within the stomach. The retention bulb is positioned properly within the gastric body. No  evidence of leak along the course of the gastrostomy tube. Contrast flows distally through the small bowel without obstruction. The appendix and cecum are normal. The colon and rectosigmoid colon are normal.  Vascular/Lymphatic: Abdominal aorta is normal caliber with atherosclerotic calcification. There is no retroperitoneal or periportal lymphadenopathy. No pelvic lymphadenopathy.  Reproductive: Prostate gland is normal.  Musculoskeletal: RIGHT hip arthroplasty. Avascular necrosis of the LEFT femoral head. No aggressive osseous lesion.  Other: No free fluid.  IMPRESSION: 1. Bibasilar linear scarring may well represent sequelae of prior aspiration pneumonitis; however there is no evidence of active pneumonia or pneumonitis. 2. Percutaneous gastrostomy tube appears in good position without evidence of malposition or infection. 3. No bowel obstruction. 4. No evidence of abscess or inflammation the abdomen or pelvis. 5.  Atherosclerotic calcification of the abdominal aorta.   Electronically Signed   By: Suzy Bouchard M.D.   On: 07/10/2015 18:55   Dg Abd Acute W/chest  08/03/2015   CLINICAL DATA:  History of head and neck  cancer. The patient is on chemotherapy with the last treatment 07/30/2015. Weakness and vomiting since that time. Weight loss. Cough.  EXAM: DG ABDOMEN ACUTE W/ 1V CHEST  COMPARISON:  CT abdomen and pelvis 05/10/2015. PET CT scan 06/14/2015.  FINDINGS: Single view of the chest demonstrates a Port-A-Cath in place from a right IJ approach. The lungs appear emphysematous but are clear. There is no pneumothorax or pleural effusion. Heart size is normal.  Two views of the abdomen show no free intraperitoneal air. The bowel gas pattern is unremarkable. Surgical clips in the upper abdomen and right hip replacement are noted. 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  IMPRESSION: No acute finding chest or abdomen.  Emphysema.  Avascular necrosis left hip.   Electronically Signed   By: Inge Rise M.D.   On: 08/03/2015 11:29    ASSESSMENT: Head and neck cancer, intractable nausea and vomiting.  PLAN:    1. Head and neck cancer: Patient last received chemotherapy with cisplatin on Monday, July 30, 2015. His next scheduled chemo is on September 23rd, but given his acute symptoms this appointment likely will be postponed. Patient is also receiving daily XRT which he has not received since Wednesday. Will also recommend holding radiation until acute symptoms resolve. If patient is discharged over the weekend, he has been instructed to keep his previously scheduled follow-up on Monday for further evaluation. 2. Intractable nausea and vomiting: Agree with scheduled Zofran. Will also add scheduled Reglan. If no improvement, may have to consider GI consult to evaluate PEG tube.  Appreciate consult, will follow.  Patient expressed understanding and was in agreement with this plan. He also understands that He can call clinic at any time with any questions, concerns, or complaints.   No matching staging information was found for the patient.  Lloyd Huger, MD   08/03/2015 4:27  PM

## 2015-08-03 NOTE — ED Notes (Signed)
Pt requesting something for pain.

## 2015-08-03 NOTE — Plan of Care (Signed)
Problem: Discharge Progression Outcomes Goal: Other Discharge Outcomes/Goals Outcome: Progressing Plan of care progress: -IV fluids hydration -monitor labs -PRN nausea med -PRN pain meds

## 2015-08-03 NOTE — ED Notes (Signed)
Pt has mouth and throat cancer and received chemotherapy Monday.  That evening, patient began vomiting and has not been able to get out of bed since Tuesday.  Pt reports weight loss this week.  Also reports burning on urination and coughing up "green stuff" x2 weeks.

## 2015-08-03 NOTE — H&P (Signed)
[facility] [place] NAME: Grant Lee    MR#:  [id number]  DATE OF BIRTH:  May 06, 1956  DATE OF ADMISSION:  08/03/2015  PRIMARY CARE PHYSICIAN: Fae Pippin   REQUESTING/REFERRING PHYSICIAN: Thomasene Lot  CHIEF COMPLAINT:   Chief Complaint  Patient presents with  . Fatigue    HISTORY OF PRESENT ILLNESS:  Grant Lee  is a 59 y.o. 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 He had chemotherapy on Monday and has been having nausea and vomiting ever since. He is unable to keep down his tube feeding. He's lost 6-7 pounds since. No blood in the vomit. He has been feeling really weak. He's been hurting all over in his arms chest stomach. He did have an episode of diarrhea the other day. Hospitalist services were contacted for further evaluation.  PAST MEDICAL HISTORY:   Past Medical History  Diagnosis Date  . Hypertension   . Pneumonia     2004  . GERD (gastroesophageal reflux disease)   . Arthritis   . Anemia     blood transfusion 2012  . Bleeding ulcer   . Machinery accident     LEG INJURY  . Cancer     mouth 2006 then again 2016    PAST SURGICAL HISTORY:   Past Surgical History  Procedure Laterality Date  . Leg surgery    . Joint replacement      right hip  . Tongue biopsy    . Tongue surgery      multiple surgeries  . Direct laryngoscopy N/A 06/26/2015    Procedure: Laryngoscopy with tongue base biopsy ;  Surgeon: Beverly Gust, MD;  Location: [facility] ORS;  Service: ENT;  Laterality: N/A;  . Esophagogastroduodenoscopy N/A 07/06/2015    Procedure: ESOPHAGOGASTRODUODENOSCOPY (EGD) and percutaneous gastrostomy tyube placement;  Surgeon: Lollie Sails, MD;  Location: [facility] ENDOSCOPY;  Service: Endoscopy;  Laterality: N/A;  Residual need to be done in the afternoon on 07/06/2015. Combined procedure with Dr. Gustavo Lah and Dr Rayann Heman    SOCIAL HISTORY:   Social  History  Substance Use Topics  . Smoking status: Former Smoker -- 1.00 packs/day for 0 years    Types: Cigarettes    Quit date: 09/21/2014  . Smokeless tobacco: Not on file  . Alcohol Use: Yes     Comment: beer/wine every day    FAMILY HISTORY:   Family History  Problem Relation Age of Onset  . CAD Mother   . CAD Father     DRUG ALLERGIES:  No Known Allergies  REVIEW OF SYSTEMS:  CONSTITUTIONAL: No fever, positive for fatigue and weakness. Positive for weight loss. Positive for chills EYES: No blurred or double vision. Wears glasses EARS, NOSE, AND THROAT: No tinnitus or ear pain. Positive for sore throat. RESPIRATORY: Positive for cough with greenish phlegm, no shortness of breath, wheezing or hemoptysis.  CARDIOVASCULAR: Occasional chest pain, no orthopnea, edema.  GASTROINTESTINAL: Positive for nausea, vomiting, and abdominal pain. No blood in bowel movements. Positive diarrhea the other day GENITOURINARY: Positive for dysuria, no hematuria.  ENDOCRINE: No polyuria, nocturia,  HEMATOLOGY: No anemia, easy bruising or bleeding SKIN: No rash or lesion. MUSCULOSKELETAL: Positive for arm pain  NEUROLOGIC: Positive for dizziness and weakness PSYCHIATRY: No anxiety or depression.   MEDICATIONS AT HOME:   Prior to Admission medications   Medication Sig Start Date End Date Taking? Authorizing Provider  acidophilus (RISAQUAD) CAPS capsule Take 1 capsule  by mouth daily.   Yes Historical Provider, MD  chlorhexidine (PERIDEX) 0.12 % solution 15 mLs by Mouth Rinse route 2 (two) times daily. 07/07/15  Yes Fritzi Mandes, MD  citalopram (CELEXA) 20 MG tablet Take 1 tablet (20 mg total) by mouth daily. 07/26/15  Yes Forest Gleason, MD  fentaNYL (DURAGESIC - DOSED MCG/HR) 25 MCG/HR patch Place 2 patches (50 mcg total) onto the skin every 3 (three) days. 07/26/15  Yes Forest Gleason, MD  lidocaine-prilocaine (EMLA) cream Apply 1 application topically as needed. Patient taking differently: Apply 1  application topically as needed (prior to accessing port).  08/01/15  Yes Forest Gleason, MD  lisinopril (PRINIVIL,ZESTRIL) 10 MG tablet Take 10 mg by mouth daily.   Yes Historical Provider, MD  omeprazole (PRILOSEC) 40 MG capsule Take 40 mg by mouth daily.   Yes Historical Provider, MD  ondansetron (ZOFRAN) 4 MG tablet Take 1 tablet (4 mg total) by mouth every 4 (four) hours as needed for nausea or vomiting. 07/23/15  Yes Forest Gleason, MD  oxycodone (ROXICODONE) 30 MG immediate release tablet Take 30 mg by mouth 5 (five) times daily.   Yes Historical Provider, MD  phenol (CHLORASEPTIC) 1.4 % LIQD Use as directed 1 spray in the mouth or throat as needed for throat irritation / pain. 07/07/15  Yes Fritzi Mandes, MD  pravastatin (PRAVACHOL) 40 MG tablet Take 40 mg by mouth daily.   Yes Historical Provider, MD  traZODone (DESYREL) 100 MG tablet Take 100 mg by mouth at bedtime.   Yes Historical Provider, MD  oxyCODONE-acetaminophen (PERCOCET) 7.5-325 MG per tablet Take 1 tablet by mouth every 4 (four) hours as needed for severe pain. Patient not taking: Reported on 07/27/2015 06/26/15   Beverly Gust, MD      VITAL SIGNS:  Blood pressure 135/84, pulse 83, temperature 98.3 F (36.8 C), temperature source Oral, height '6\' 2"'$  (1.88 m), weight 60.782 kg (134 lb), SpO2 96 %.  PHYSICAL EXAMINATION:  GENERAL:  59 y.o.-year-old patient lying in the bed with no acute distress.  EYES: Pupils equal, round, reactive to light and accommodation. No scleral icterus. Extraocular muscles intact.  HEENT: Head atraumatic, normocephalic. Oropharynx and nasopharynx clear.  NECK:  Supple, no jugular venous distention. No thyroid enlargement, no tenderness. Tightened skin over the neck from prior surgery and radiation LUNGS: Normal breath sounds bilaterally, no wheezing, rales,rhonchi or crepitation. No use of accessory muscles of respiration.  CARDIOVASCULAR: S1, S2 normal. No murmurs, rubs, or gallops.  ABDOMEN: Soft,  tenderness over the lower abdomen over bladder., nondistended. Bowel sounds present. No organomegaly or mass.  EXTREMITIES: No pedal edema, cyanosis, or clubbing.  NEUROLOGIC: Cranial nerves II through XII are intact. Muscle strength 5/5 in all extremities. Sensation intact. Gait not checked.  PSYCHIATRIC: The patient is alert and oriented x 3.  SKIN: No rash, lesion, or ulcer.   LABORATORY PANEL:   CBC  Recent Labs Lab 08/03/15 1130  WBC 10.6  HGB 10.5*  HCT 30.2*  PLT 204   ------------------------------------------------------------------------------------------------------------------  Chemistries   Recent Labs Lab 08/03/15 1130  NA 130*  K 3.4*  CL 94*  CO2 27  GLUCOSE 89  BUN 21*  CREATININE 0.67  CALCIUM 8.4*  AST 21  ALT 12*  ALKPHOS 76  BILITOT 1.0   ------------------------------------------------------------------------------------------------------------------  RADIOLOGY:  Dg Abd Acute W/chest  08/03/2015   CLINICAL DATA:  History of head and neck cancer. The patient is on chemotherapy with the last treatment 07/30/2015. Weakness and vomiting since that  time. Weight loss. Cough.  EXAM: DG ABDOMEN ACUTE W/ 1V CHEST  COMPARISON:  CT abdomen and pelvis 05/10/2015. PET CT scan 06/14/2015.  FINDINGS: Single view of the chest demonstrates a Port-A-Cath in place from a right IJ approach. The lungs appear emphysematous but are clear. There is no pneumothorax or pleural effusion. Heart size is normal.  Two views of the abdomen show no free intraperitoneal air. The bowel gas pattern is unremarkable. Surgical clips in the upper abdomen and right hip replacement are noted. 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  IMPRESSION: No acute finding chest or abdomen.  Emphysema.  Avascular necrosis left hip.   Electronically Signed   By: Inge Rise M.D.   On: 08/03/2015 11:29    EKG:   Not done  IMPRESSION AND PLAN:   1.  Chemotherapy-induced nausea vomiting. I spoke with the pharmacy to give 1 dose of Kytril. I will give around-the-clock Zofran. I will hold tube feeding today and have dietary restart tube feeding tomorrow. We'll also put on H2 blocker. 2. Hypokalemia- replace potassium in IV fluids 3. Essential hypertension continue lisinopril 4. Hyperlipidemia unspecified continue pravastatin 5. Depression on Celexa 6. Chronic pain on oxycodone.  All the records are reviewed and case discussed with ED provider. Management plans discussed with the patient, family and they are in agreement.  CODE STATUS: Full code  TOTAL TIME TAKING CARE OF THIS PATIENT: 50 minutes.    Loletha Grayer M.D on 08/03/2015 at 2:24 PM  Between 7am to 6pm - Pager - [phone]  After 6pm call admission pager [facility]  Office  [phone]  CC: Primary care physician; Cyndi Bender, [name]

## 2015-08-04 ENCOUNTER — Ambulatory Visit: Payer: Medicare Other

## 2015-08-04 LAB — BASIC METABOLIC PANEL
Anion gap: 7 (ref 5–15)
BUN: 15 mg/dL (ref 6–20)
CALCIUM: 8.4 mg/dL — AB (ref 8.9–10.3)
CO2: 25 mmol/L (ref 22–32)
CREATININE: 0.57 mg/dL — AB (ref 0.61–1.24)
Chloride: 103 mmol/L (ref 101–111)
GFR calc Af Amer: >60 mL/min (ref >60)
GLUCOSE: 74 mg/dL (ref 65–99)
Potassium: 3.7 mmol/L (ref 3.5–5.1)
SODIUM: 135 mmol/L (ref 135–145)

## 2015-08-04 LAB — CBC
HEMATOCRIT: 27.8 % — AB (ref 40.0–52.0)
Hemoglobin: 9.2 g/dL — ABNORMAL LOW (ref 13.0–18.0)
MCH: 32.1 pg (ref 26.0–34.0)
MCHC: 33.2 g/dL (ref 32.0–36.0)
MCV: 96.9 fL (ref 80.0–100.0)
PLATELETS: 201 10*3/uL (ref 150–440)
RBC: 2.87 MIL/uL — ABNORMAL LOW (ref 4.40–5.90)
RDW: 12.6 % (ref 11.5–14.5)
WBC: 9.3 10*3/uL (ref 3.8–10.6)

## 2015-08-04 MED ORDER — FREE WATER
200.0000 mL | Freq: Four times a day (QID) | Status: DC
  Administered 2015-08-04 – 2015-08-09 (×13): 200 mL

## 2015-08-04 MED ORDER — JEVITY 1.5 CAL/FIBER PO LIQD
237.0000 mL | Freq: Four times a day (QID) | ORAL | Status: DC
  Administered 2015-08-04: 237 mL

## 2015-08-04 MED ORDER — CETYLPYRIDINIUM CHLORIDE 0.05 % MT LIQD
7.0000 mL | Freq: Two times a day (BID) | OROMUCOSAL | Status: DC
  Administered 2015-08-04 – 2015-08-07 (×5): 7 mL via OROMUCOSAL

## 2015-08-04 MED ORDER — LACTULOSE 10 GM/15ML PO SOLN
30.0000 g | Freq: Four times a day (QID) | ORAL | Status: AC
  Administered 2015-08-04 (×2): 30 g
  Filled 2015-08-04 (×2): qty 60

## 2015-08-04 NOTE — Plan of Care (Signed)
Problem: Discharge Progression Outcomes Goal: Other Discharge Outcomes/Goals Outcome: Progressing VSS. Oxycodone given x2 for chronic throat pain with some improvement, pt declined further action. Phenergan given once for nausea with improvement. Continue on scheduled nausea meds. No emesis during the shift. Free water and feeding via PEG tube initiated, tolerates well. 2xlarge soft brown stool this evening.

## 2015-08-04 NOTE — Progress Notes (Signed)
Initial Nutrition Assessment  DOCUMENTATION CODES:   Severe malnutrition in context of acute illness/injury (likely chronic as well due to cancer)  INTERVENTION:   Coordination of Care: discussed nutritional poc with pt; pt reports nausea despite meds, vomitting this AM despite being NPO. Pt requesting that we hold off on initiation of TF at this time due to continued N/V; agree with this plan. Will discuss with Cherlyn Labella RN, MD Sudini. Per MD, will start free water flushes via PEG now. Will also go ahead and place orders for TF via PEG. If tolerating water flushes, will plan to start feedibg when pt without symptoms of N/V.  Will start with 1 can of Jevity 1.5 4 times daily. Goal will be to continue pt TF regimen of Jevity 1.5 6 cans daily as described below.Per MD Sudini, will start oral diet as tolerated once tolerating TF   NUTRITION DIAGNOSIS:   Inadequate enteral nutrition infusion related to nausea, vomiting, cancer and cancer related treatments as evidenced by  (inability to tolerate TF or oral nutrition for 5 days with wt loss).  GOAL:   Patient will meet greater than or equal to 90% of their needs  MONITOR:    (Energy Intake, Anthropometrics, Digestive System, Electrolyte/Renal Profile)  REASON FOR ASSESSMENT:   Consult Enteral/tube feeding initiation and management  ASSESSMENT:    Pt admitted with weakness, N/V since Monday after receiving Chemo; pt reports nausea this AM, vomiting small amounts as well. Pt reports he has been very unstable on his feet, wife assisting pt from bedroom to living room, using wheelchair  Past Medical History  Diagnosis Date  . Hypertension   . Pneumonia     2004  . GERD (gastroesophageal reflux disease)   . Arthritis   . Anemia     blood transfusion 2012  . Bleeding ulcer   . Machinery accident     LEG INJURY  . Cancer     mouth 2006 then again 2016     Diet Order:  Diet NPO time specified   Food and Nutrition related history:  Since Monday, pt has been unable to tolerate anything due to N/V 24 hours a day. Pt reports prior to Monday, he has been tolerating 6 cans of Jevity 1.5 per day providing 2130 kcals, 91 g of protein.Pt reports that about 1.5-2 weeks he go he started eating some solids/liquids including muscle milk, pudding, ice cream.   Last BM:  9/21   Nutrition Focused Physical Exam: Nutrition-Focused physical exam completed. Findings are moderate to severe fat depletion, moderate to severe muscle depletion, and no edema.   Meds:  NS at 75 ml/hr, reglan, zofran, phenergan  Electrolyte and Renal Profile:  Recent Labs Lab 08/03/15 1130 08/04/15 0533  BUN 21* 15  CREATININE 0.67 0.57*  NA 130* 135  K 3.4* 3.7   Glucose Profile: No results for input(s): GLUCAP in the last 72 hours. Protein Profile:  Recent Labs Lab 08/03/15 1130  ALBUMIN 2.9*    Height:   Ht Readings from Last 1 Encounters:  08/03/15 '6\' 2"'$  (1.88 m)    Weight: pt reports 7-8 pound wt loss in 1 week; 5.3% wt loss Prior to this weight had been stable recently, around 140-142 pounds per pt   Wt Readings from Last 1 Encounters:  08/03/15 134 lb (60.782 kg)   Wt Readings from Last 10 Encounters:  08/03/15 134 lb (60.782 kg)  07/27/15 141 lb (63.957 kg)  07/27/15 141 lb 12.8 oz (64.32 kg)  07/26/15  145 lb 8 oz (65.998 kg)  07/19/15 140 lb (63.504 kg)  07/09/15 142 lb 8.4 oz (64.65 kg)  07/06/15 134 lb 9.6 oz (61.054 kg)  06/26/15 142 lb (64.411 kg)  06/18/15 142 lb (64.411 kg)  06/11/15 142 lb 13.7 oz (64.8 kg)   BMI:  Body mass index is 17.2 kg/(m^2).  Estimated Nutritional Needs:   Kcal:  9144-4584 kcals (BEE 1649, 1.3 AF, 1.1-1.3 IF)  using IBW 77 kg  Protein:  85-108 h (1.1-1.4 g/kg)   Fluid:  2310-2695 mL (30-35 ml/kg)   HIGH Care Level  Kerman Passey MS, RD, LDN [phone] Pager

## 2015-08-04 NOTE — Progress Notes (Signed)
[facility] [place] NAME: Grant Lee    MR#:  [id number]  DATE OF BIRTH:  29-Apr-1956  SUBJECTIVE:  CHIEF COMPLAINT:   Chief Complaint  Patient presents with  . Fatigue  admitted for nausea/vomiting. Severe nausea today but no vomiting. Constipated. Afebrile  REVIEW OF SYSTEMS:    Review of Systems  Constitutional: Positive for malaise/fatigue. Negative for fever and chills.  HENT: Positive for sore throat.   Eyes: Negative for blurred vision, double vision and pain.  Respiratory: Positive for cough. Negative for hemoptysis, shortness of breath and wheezing.   Cardiovascular: Negative for chest pain, palpitations, orthopnea and leg swelling.  Gastrointestinal: Positive for heartburn, nausea, vomiting, abdominal pain and constipation. Negative for diarrhea.  Genitourinary: Negative for dysuria and hematuria.  Musculoskeletal: Negative for back pain and joint pain.  Skin: Negative for rash.  Neurological: Positive for weakness. Negative for sensory change, speech change, focal weakness and headaches.  Endo/Heme/Allergies: Does not bruise/bleed easily.  Psychiatric/Behavioral: Negative for depression. The patient is not nervous/anxious.       DRUG ALLERGIES:  No Known Allergies  VITALS:  Blood pressure 153/88, pulse 73, temperature 98.3 F (36.8 C), temperature source Oral, resp. rate 18, height '6\' 2"'$  (1.88 m), weight 60.782 kg (134 lb), SpO2 96 %.  PHYSICAL EXAMINATION:   Physical Exam  GENERAL:  59 y.o.-year-old patient lying in the bed with no acute distress.  EYES: Pupils equal, round, reactive to light and accommodation. No scleral icterus. Extraocular muscles intact.  HEENT: Head atraumatic, normocephalic. Oropharynx and nasopharynx clear. Dry oral mucosa NECK:  Supple, no jugular venous distention. No thyroid enlargement, no tenderness.  LUNGS: Normal breath sounds bilaterally, no wheezing, rales,  rhonchi. No use of accessory muscles of respiration.  CARDIOVASCULAR: S1, S2 normal. No murmurs, rubs, or gallops.  ABDOMEN: Soft, nontender, nondistended. Bowel sounds present. No organomegaly or mass. PEG tube EXTREMITIES: No cyanosis, clubbing or edema b/l.    NEUROLOGIC: Cranial nerves II through XII are intact. No focal Motor or sensory deficits b/l.   PSYCHIATRIC: The patient is alert and oriented x 3.  SKIN: No obvious rash, lesion, or ulcer.    LABORATORY PANEL:   CBC  Recent Labs Lab 08/04/15 0533  WBC 9.3  HGB 9.2*  HCT 27.8*  PLT 201   ------------------------------------------------------------------------------------------------------------------  Chemistries   Recent Labs Lab 08/03/15 1130 08/04/15 0533  NA 130* 135  K 3.4* 3.7  CL 94* 103  CO2 27 25  GLUCOSE 89 74  BUN 21* 15  CREATININE 0.67 0.57*  CALCIUM 8.4* 8.4*  AST 21  --   ALT 12*  --   ALKPHOS 76  --   BILITOT 1.0  --    ------------------------------------------------------------------------------------------------------------------  Cardiac Enzymes No results for input(s): TROPONINI in the last 168 hours. ------------------------------------------------------------------------------------------------------------------  RADIOLOGY:  Dg Abd Acute W/chest  08/03/2015   CLINICAL DATA:  History of head and neck cancer. The patient is on chemotherapy with the last treatment 07/30/2015. Weakness and vomiting since that time. Weight loss. Cough.  EXAM: DG ABDOMEN ACUTE W/ 1V CHEST  COMPARISON:  CT abdomen and pelvis 05/10/2015. PET CT scan 06/14/2015.  FINDINGS: Single view of the chest demonstrates a Port-A-Cath in place from a right IJ approach. The lungs appear emphysematous but are clear. There is no pneumothorax or pleural effusion. Heart size is normal.  Two views of the abdomen show no free intraperitoneal air. The bowel gas pattern is unremarkable. Surgical clips  in the upper abdomen and  right hip replacement are noted. 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  IMPRESSION: No acute finding chest or abdomen.  Emphysema.  Avascular necrosis left hip.   Electronically Signed   By: Inge Rise M.D.   On: 08/03/2015 11:29     ASSESSMENT AND PLAN:   1. Chemotherapy-induced nausea and vomiting. Start some free water thru PEG as patient is not keen on starting TFs. Start TFs if he tolerates water. Also Add laxatives for constipation. Zofran, reglan.  2. Hypokalemia- replace potassium in IV fluids  3. Essential hypertension continue lisinopril  4. Hyperlipidemia unspecified continue pravastatin  5. Depression on Celexa  6. Chronic pain on oxycodone.  7. Dehydration Increase IVF   All the records are reviewed and case discussed with Care Management/Social Workerr. Management plans discussed with the patient, family and they are in agreement.  CODE STATUS: FULL  DVT Prophylaxis: SCDs  TOTAL TIME TAKING CARE OF THIS PATIENT: 35 minutes.   POSSIBLE D/C IN  DAYS, DEPENDING ON CLINICAL CONDITION.   Hillary Bow R M.D on 08/04/2015 at 9:30 AM  Between 7am to 6pm - Pager - [phone]  After 6pm go to [email or web address] - password [email or web address] [facility]  Office  [phone]  CC: Primary care physician; Cyndi Bender, [name]

## 2015-08-05 LAB — MAGNESIUM: MAGNESIUM: 1.7 mg/dL (ref 1.7–2.4)

## 2015-08-05 LAB — PHOSPHORUS: Phosphorus: 2.6 mg/dL (ref 2.5–4.6)

## 2015-08-05 MED ORDER — ONDANSETRON HCL 4 MG/2ML IJ SOLN
4.0000 mg | Freq: Once | INTRAMUSCULAR | Status: AC
  Administered 2015-08-05: 4 mg via INTRAVENOUS
  Filled 2015-08-05: qty 2

## 2015-08-05 MED ORDER — LORAZEPAM 2 MG/ML IJ SOLN
2.0000 mg | Freq: Once | INTRAMUSCULAR | Status: AC
  Administered 2015-08-05: 2 mg via INTRAVENOUS
  Filled 2015-08-05: qty 1

## 2015-08-05 NOTE — Progress Notes (Signed)
[facility] [place] NAME: Grant Lee    MR#:  [id number]  DATE OF BIRTH:  October 23, 1956  SUBJECTIVE:  CHIEF COMPLAINT:   Chief Complaint  Patient presents with  . Fatigue  admitted for nausea/vomiting. On going vomiting Had 2 BMs yesterday Afebrile. No abd pain.  REVIEW OF SYSTEMS:    Review of Systems  Constitutional: Positive for malaise/fatigue. Negative for fever and chills.  HENT: Positive for sore throat.   Eyes: Negative for blurred vision, double vision and pain.  Respiratory: Positive for cough. Negative for hemoptysis, shortness of breath and wheezing.   Cardiovascular: Negative for chest pain, palpitations, orthopnea and leg swelling.  Gastrointestinal: Positive for heartburn, nausea, vomiting, abdominal pain and constipation. Negative for diarrhea.  Genitourinary: Negative for dysuria and hematuria.  Musculoskeletal: Negative for back pain and joint pain.  Skin: Negative for rash.  Neurological: Positive for weakness. Negative for sensory change, speech change, focal weakness and headaches.  Endo/Heme/Allergies: Does not bruise/bleed easily.  Psychiatric/Behavioral: Negative for depression. The patient is not nervous/anxious.       DRUG ALLERGIES:  No Known Allergies  VITALS:  Blood pressure 164/92, pulse 77, temperature 97.8 F (36.6 C), temperature source Oral, resp. rate 18, height '6\' 2"'$  (1.88 m), weight 60.782 kg (134 lb), SpO2 100 %.  PHYSICAL EXAMINATION:   Physical Exam  GENERAL:  59 y.o.-year-old patient lying in the bed with no acute distress.  EYES: Pupils equal, round, reactive to light and accommodation. No scleral icterus. Extraocular muscles intact.  HEENT: Head atraumatic, normocephalic. Oropharynx and nasopharynx clear. Dry oral mucosa NECK:  Supple, no jugular venous distention. No thyroid enlargement, no tenderness.  LUNGS: Normal breath sounds bilaterally, no wheezing, rales,  rhonchi. No use of accessory muscles of respiration.  CARDIOVASCULAR: S1, S2 normal. No murmurs, rubs, or gallops.  ABDOMEN: Soft, nontender, nondistended. Bowel sounds present. No organomegaly or mass. PEG tube EXTREMITIES: No cyanosis, clubbing or edema b/l.    NEUROLOGIC: Cranial nerves II through XII are intact. No focal Motor or sensory deficits b/l.   PSYCHIATRIC: The patient is alert and oriented x 3.  SKIN: No obvious rash, lesion, or ulcer.    LABORATORY PANEL:   CBC  Recent Labs Lab 08/04/15 0533  WBC 9.3  HGB 9.2*  HCT 27.8*  PLT 201   ------------------------------------------------------------------------------------------------------------------  Chemistries   Recent Labs Lab 08/03/15 1130 08/04/15 0533 08/05/15 0412  NA 130* 135  --   K 3.4* 3.7  --   CL 94* 103  --   CO2 27 25  --   GLUCOSE 89 74  --   BUN 21* 15  --   CREATININE 0.67 0.57*  --   CALCIUM 8.4* 8.4*  --   MG  --   --  1.7  AST 21  --   --   ALT 12*  --   --   ALKPHOS 76  --   --   BILITOT 1.0  --   --    ------------------------------------------------------------------------------------------------------------------  Cardiac Enzymes No results for input(s): TROPONINI in the last 168 hours. ------------------------------------------------------------------------------------------------------------------  RADIOLOGY:  No results found.   ASSESSMENT AND PLAN:   1. Nausea and vomiting- Likely Chemotherapy-induced Started free water flushes in PEG and patient not tolerating. Discussed with Dr. Grayland Ormond. Cisplantin was low dose and unlikly to cause significant vomiting. Will request GI to see patient. EGD? Discussed with Dr. Nestor Lewandowsky  2. Hypokalemia- replace potassium in IV fluids  3. Essential hypertension continue lisinopril  4. Hyperlipidemia unspecified continue pravastatin  5. Depression on Celexa  6. Chronic pain on oxycodone.  7. Dehydration On IVF   All the  records are reviewed and case discussed with Care Management/Social Workerr. Management plans discussed with the patient, family and they are in agreement.  CODE STATUS: FULL  DVT Prophylaxis: SCDs  TOTAL TIME TAKING CARE OF THIS PATIENT: 35 minutes.   POSSIBLE D/C IN  DAYS, DEPENDING ON CLINICAL CONDITION.   Hillary Bow R M.D on 08/05/2015 at 11:54 AM  Between 7am to 6pm - Pager - [phone]  After 6pm go to [email or web address] - password [email or web address] [facility]  Office  [phone]  CC: Primary care physician; Cyndi Bender, [name]

## 2015-08-05 NOTE — Care Management Note (Signed)
Case Management Note  Patient Details  Name: Grant Lee MRN: [id number] Date of Birth: 20-Nov-1955  Subjective/Objective:                    Action/Plan: admit date for readmission is 9.23.2016   Expected Discharge Date:                  Expected Discharge Plan:     In-House Referral:     Discharge planning Services     Post Acute Care Choice:    Choice offered to:     DME Arranged:    DME Agency:     HH Arranged:    [place] Agency:     Status of Service:     Medicare Important Message Given:    Date Medicare IM Given:    Medicare IM give by:    Date Additional Medicare IM Given:    Additional Medicare Important Message give by:     If discussed at [place] of Stay Meetings, dates discussed:    Additional Comments:  Ival Bible, RN 08/05/2015, 4:33 PM

## 2015-08-05 NOTE — Plan of Care (Signed)
Problem: Discharge Progression Outcomes Goal: Other Discharge Outcomes/Goals Outcome: Progressing VSS. IVF continues. Throat pain managed with pain meds with minimal relief, but pt declined further actions. Nausea managed with scheduled nausea meds. No emesis throughout the shift. Tube feeding d/c. Free water continues, tolerates well. GI consult pending.

## 2015-08-05 NOTE — Plan of Care (Addendum)
Individualization of Care Pt prefers to be called Grant Lee Hx of Mouth and throat cancer, HTN, pneumonia, GERD, arthritis, anemia, bleeding ulcer and machinery accident involving leg.  Progress toward goals:  Problem: Discharge Progression Outcomes Goal: Other Discharge Outcomes/Goals Pain:  Pain not controlled this shift.  Given Oxy x 3 and Morphine x 1.  Patient still did not get pain levels below 5/10.   Hemodynamically:  Patient afebrile and VSS this shift.  BP 135/85 mmHg  Pulse 85  Temp(Src) 98.1 F (36.7 C) (Oral)  Resp 18  Ht '6\' 2"'$  (1.88 m)  Wt 134 lb (60.782 kg)  BMI 17.20 kg/m2  SpO2 86% Complications:  Patient with several episodes of N/V this shift.  Given Zofran and Phenergan with no resolution.  Paged physician who ordered Ativan and zofran one time dose.  Patient finally had relief and was able to rest. Diet:  Patient not tolerating diet.  He refused his jevity and 4:00 a.m. Free water due to nausea and vomiting. Activity:  Patient is a high fall risk due to previous falls.  He refuses bed alarm.  He is up to the bathroom without calling but is steady on his feet.

## 2015-08-05 NOTE — Consult Note (Addendum)
GI Inpatient Consult Note Grant Sails MD  Reason for Consult: Nausea and vomiting   Attending Requesting Consult: Hillary Bow, MD  Outpatient Primary Physician: Cyndi Bender, PA-C  History of Present Illness: Grant Lee is a 59 y.o. male presenting with intractable nausea and vomiting. He has a history of recurrent head and neck cancer and had recently begun to the treatment with cisplatin and radiation. I had seen him initially on 07/06/2015 due to problems of dysphagia and malnutrition. He had a PEG placed at that time anticipation of need during further treatment. He states he has done well with that however after he started this most recent round of cisplatin became quite nauseated. His last round of chemotherapy began on this past Monday on day evening he was throwing up. He is basically dependent on his tube feeds lobe was tolerating some pleasure feedings orally. Did undergo one round of cisplatin without difficulty prior to this episode. His tube feeding was held after admission until last night with retrial he had a repeat of the emesis. He has been tolerating clear water flushes. On his EGD done 07/06/2015 there was evidence of some mild gastritis.  She states he takes either Excedrin or an aspirin on a daily basis for headaches. He also takes omeprazole daily. He does have a history of gastric ulcer, this was a bleeding ulcer, in about 2012.  Past Medical History:  Past Medical History  Diagnosis Date  . Hypertension   . Pneumonia     2004  . GERD (gastroesophageal reflux disease)   . Arthritis   . Anemia     blood transfusion 2012  . Bleeding ulcer   . Machinery accident     LEG INJURY  . Cancer     mouth 2006 then again 2016    Problem List: Patient Active Problem List   Diagnosis Date Noted  . Chemotherapy induced nausea and vomiting 08/03/2015  . Cancer of tongue   . Protein-calorie malnutrition, severe 07/05/2015  . Malnutrition 07/03/2015  .  Aspiration pneumonitis 07/03/2015  . DU (duodenal ulcer) 06/11/2015  . Tongue cancer 06/11/2015  . Abnormal loss of weight 10/09/2014  . Can't get food down 10/09/2014    Past Surgical History: Past Surgical History  Procedure Laterality Date  . Leg surgery    . Joint replacement      right hip  . Tongue biopsy    . Tongue surgery      multiple surgeries  . Direct laryngoscopy N/A 06/26/2015    Procedure: Laryngoscopy with tongue base biopsy ;  Surgeon: Beverly Gust, MD;  Location: [facility] ORS;  Service: ENT;  Laterality: N/A;  . Esophagogastroduodenoscopy N/A 07/06/2015    Procedure: ESOPHAGOGASTRODUODENOSCOPY (EGD) and percutaneous gastrostomy tyube placement;  Surgeon: Grant Sails, MD;  [facility];  Service: Endoscopy;  Laterality: N/A;  Residual need to be done in the afternoon on 07/06/2015. Combined procedure with Dr. Gustavo Lah and Dr Rayann Heman    Allergies: No Known Allergies  Home Medications: Prescriptions prior to admission  Medication Sig Dispense Refill Last Dose  . acidophilus (RISAQUAD) CAPS capsule Take 1 capsule by mouth daily.   unknown at unknown   . chlorhexidine (PERIDEX) 0.12 % solution 15 mLs by Mouth Rinse route 2 (two) times daily. 120 mL 0 unknown at unknown  . citalopram (CELEXA) 20 MG tablet Take 1 tablet (20 mg total) by mouth daily. 30 tablet 3 unknown at unknown  . fentaNYL (DURAGESIC - DOSED MCG/HR) 25 MCG/HR  patch Place 2 patches (50 mcg total) onto the skin every 3 (three) days. 10 patch 0 08/02/2015 at 1400  . lidocaine-prilocaine (EMLA) cream Apply 1 application topically as needed. (Patient taking differently: Apply 1 application topically as needed (prior to accessing port). ) 30 g 3 PRN at PRN  . lisinopril (PRINIVIL,ZESTRIL) 10 MG tablet Take 10 mg by mouth daily.   unknown at unknown  . omeprazole (PRILOSEC) 40 MG capsule Take 40 mg by mouth daily.   unknown at unknown  . ondansetron (ZOFRAN) 4 MG tablet Take 1 tablet (4 mg total) by  mouth every 4 (four) hours as needed for nausea or vomiting. 45 tablet 2 08/03/2015 at Unknown time  . oxycodone (ROXICODONE) 30 MG immediate release tablet Take 30 mg by mouth 5 (five) times daily.   unknown at unknown  . phenol (CHLORASEPTIC) 1.4 % LIQD Use as directed 1 spray in the mouth or throat as needed for throat irritation / pain. 177 mL 0 Past Week at Unknown time  . pravastatin (PRAVACHOL) 40 MG tablet Take 40 mg by mouth daily.   unknown at unknown  . traZODone (DESYREL) 100 MG tablet Take 100 mg by mouth at bedtime.   unknown at unknown  . oxyCODONE-acetaminophen (PERCOCET) 7.5-325 MG per tablet Take 1 tablet by mouth every 4 (four) hours as needed for severe pain. (Patient not taking: Reported on 07/27/2015) 30 tablet 0 Not Taking at Unknown time   Home medication reconciliation was completed with the patient.   Scheduled Inpatient Medications:   . antiseptic oral rinse  7 mL Mouth Rinse q12n4p  . cefTRIAXone (ROCEPHIN)  IV  1 g Intravenous Q24H  . citalopram  20 mg Per Tube Daily  . enoxaparin (LOVENOX) injection  40 mg Subcutaneous Q24H  . free water  200 mL Per Tube Q6H  . Influenza vac split quadrivalent PF  0.5 mL Intramuscular Tomorrow-1000  . lisinopril  10 mg Per Tube Daily  . metoCLOPramide (REGLAN) injection  10 mg Intravenous 4 times per day  . ondansetron (ZOFRAN) IV  4 mg Intravenous 4 times per day  . pravastatin  40 mg Per Tube q1800  . traZODone  100 mg Per Tube QHS    Continuous Inpatient Infusions:   . 0.9 % NaCl with KCl 20 mEq / L 125 mL/hr at 08/05/15 1055    PRN Inpatient Medications:  morphine injection, oxyCODONE, promethazine  Family History: family history includes CAD in his father and mother.   GI Family History:   Social History:   reports that he quit smoking about 10 months ago. His smoking use included Cigarettes. He smoked 1.00 pack per day for 0 years. He does not have any smokeless tobacco history on file. He reports that he drinks  alcohol. He reports that he does not use illicit drugs. ROS  Review of Systems: 10 systems reviewed per admission history and physical agree with same  Physical Examination: BP 158/98 mmHg  Pulse 78  Temp(Src) 98.5 F (36.9 C) (Oral)  Resp 18  Ht '6\' 2"'$  (1.88 m)  Wt 60.782 kg (134 lb)  BMI 17.20 kg/m2  SpO2 99% Gen: Thin 59 year old male no acute distress HEENT: Normocephalic, Neck showing evidence of previous surgeries Neck: Above Chest: Bilaterally clear to auscultation CV: Regular rate and rhythm Abd: Soft nontender nondistended bowel sounds positive normoactive. PEG is in place however the tube is well inside the abdomen with only the ports above the bumper.. There are no masses or rebound.  Ext: No clubbing cyanosis or edema Skin: Other:  Data: Lab Results  Component Value Date   WBC 9.3 08/04/2015   HGB 9.2* 08/04/2015   HCT 27.8* 08/04/2015   MCV 96.9 08/04/2015   PLT 201 08/04/2015    Recent Labs Lab 08/03/15 1130 08/04/15 0533  HGB 10.5* 9.2*   Lab Results  Component Value Date   NA 135 08/04/2015   K 3.7 08/04/2015   CL 103 08/04/2015   CO2 25 08/04/2015   BUN 15 08/04/2015   CREATININE 0.57* 08/04/2015   Lab Results  Component Value Date   ALT 12* 08/03/2015   AST 21 08/03/2015   ALKPHOS 76 08/03/2015   BILITOT 1.0 08/03/2015   No results for input(s): APTT, INR, PTT in the last 168 hours. CBC Latest Ref Rng 08/04/2015 08/03/2015 07/26/2015  WBC 3.8 - 10.6 K/uL 9.3 10.6 9.6  Hemoglobin 13.0 - 18.0 g/dL 9.2(L) 10.5(L) 12.4(L)  Hematocrit 40.0 - 52.0 % 27.8(L) 30.2(L) 36.8(L)  Platelets 150 - 440 K/uL 201 204 369    STUDIES: No results found. '@IMAGES'$ @  Assessment: 1. Migration of the PEG tube in bumper into the distal stomach. Gently tried to bring the tube back up into the stomach however I believe the bumper may have migrated the pylorus this would tend to give a intermittent outlet obstruction.  Recommendations:Will discuss further with  radiology tomorrow. I will arrange to obtain radiology and fluoroscopy assistance locating this and try to gently bring it back up its appropriate location.    Thank you for the consult. Please call with questions or concerns.  Grant Sails, MD  08/05/2015 10:01 PM     Addendum I rechecked the tube take sure that the ports were not a separate piece would endanger coming apart full migration of the tube. This appears to be all one piece apparatus that cannot come apart. I did tape it off to the side to further asure no problems.

## 2015-08-05 NOTE — Progress Notes (Signed)
[name]  [phone] [phone]) [phone]  ID: Grant Lee OB: 1956/08/07  MR#: [id number]  MWN#:[id number]  Patient Care Team: Cyndi Bender, PA-C as PCP - General (Physician Assistant) Beverly Gust, MD (Unknown Physician Specialty)  CHIEF COMPLAINT:  Chief Complaint  Patient presents with  . Fatigue    INTERVAL HISTORY: Patient with continued nausea and vomiting even with water flushes through his PEG tube. He otherwise feels well.   REVIEW OF SYSTEMS:   Review of Systems  Constitutional: Positive for malaise/fatigue. Negative for fever.  Respiratory: Negative.   Cardiovascular: Negative.   Gastrointestinal: Positive for nausea and vomiting. Negative for abdominal pain, diarrhea and constipation.  Musculoskeletal: Negative.   Neurological: Positive for weakness.    As per HPI. Otherwise, a complete review of systems is negatve.  PAST MEDICAL HISTORY: Past Medical History  Diagnosis Date  . Hypertension   . Pneumonia     2004  . GERD (gastroesophageal reflux disease)   . Arthritis   . Anemia     blood transfusion 2012  . Bleeding ulcer   . Machinery accident     LEG INJURY  . Cancer     mouth 2006 then again 2016    PAST SURGICAL HISTORY: Past Surgical History  Procedure Laterality Date  . Leg surgery    . Joint replacement      right hip  . Tongue biopsy    . Tongue surgery      multiple surgeries  . Direct laryngoscopy N/A 06/26/2015    Procedure: Laryngoscopy with tongue base biopsy ;  Surgeon: Beverly Gust, MD;  Location: [facility] ORS;  Service: ENT;  Laterality: N/A;  . Esophagogastroduodenoscopy N/A 07/06/2015    Procedure: ESOPHAGOGASTRODUODENOSCOPY (EGD) and percutaneous gastrostomy tyube placement;  Surgeon: Lollie Sails, MD;  Location: [facility] ENDOSCOPY;  Service: Endoscopy;  Laterality: N/A;  Residual need to be done in the afternoon on 07/06/2015. Combined procedure with Dr. Gustavo Lah and Dr Rayann Heman     FAMILY HISTORY Family History  Problem Relation Age of Onset  . CAD Mother   . CAD Father        ADVANCED DIRECTIVES:    HEALTH MAINTENANCE: Social History  Substance Use Topics  . Smoking status: Former Smoker -- 1.00 packs/day for 0 years    Types: Cigarettes    Quit date: 09/21/2014  . Smokeless tobacco: None  . Alcohol Use: Yes     Comment: beer/wine every day     Colonoscopy:  PAP:  Bone density:  Lipid panel:  No Known Allergies  Current Facility-Administered Medications  Medication Dose Route Frequency Provider Last Rate Last Dose  . 0.9 % NaCl with KCl 20 mEq/ L  infusion   Intravenous Continuous Hillary Bow, MD 125 mL/hr at 08/05/15 1055    . antiseptic oral rinse (CPC / CETYLPYRIDINIUM CHLORIDE 0.05%) solution 7 mL  7 mL Mouth Rinse q12n4p Hillary Bow, MD   7 mL at 08/04/15 1808  . cefTRIAXone (ROCEPHIN) 1 g in dextrose 5 % 50 mL IVPB  1 g Intravenous Q24H Loletha Grayer, MD   1 g at 08/04/15 1845  . citalopram (CELEXA) tablet 20 mg  20 mg Per Tube Daily Loletha Grayer, MD   20 mg at 08/05/15 0936  . enoxaparin (LOVENOX) injection 40 mg  40 mg Subcutaneous Q24H Loletha Grayer, MD   40 mg at 08/04/15 1521  . free water 200 mL  200 mL Per Tube Q6H Srikar Sudini, MD   200 mL  at 08/05/15 0936  . Influenza vac split quadrivalent PF (FLUARIX) injection 0.5 mL  0.5 mL Intramuscular Tomorrow-1000 Loletha Grayer, MD      . lisinopril (PRINIVIL,ZESTRIL) tablet 10 mg  10 mg Per Tube Daily Loletha Grayer, MD   10 mg at 08/05/15 0936  . metoCLOPramide (REGLAN) injection 10 mg  10 mg Intravenous 4 times per day Lloyd Huger, MD   10 mg at 08/05/15 0602  . morphine 2 MG/ML injection 2 mg  2 mg Intravenous Q4H PRN Loletha Grayer, MD   2 mg at 08/05/15 1047  . ondansetron (ZOFRAN) injection 4 mg  4 mg Intravenous 4 times per day Loletha Grayer, MD   4 mg at 08/05/15 0602  . oxyCODONE (Oxy IR/ROXICODONE) immediate release tablet 30 mg  30 mg Per Tube Q4H PRN  Loletha Grayer, MD   30 mg at 08/05/15 0936  . pravastatin (PRAVACHOL) tablet 40 mg  40 mg Per Tube q1800 Loletha Grayer, MD   40 mg at 08/04/15 1807  . promethazine (PHENERGAN) injection 25 mg  25 mg Intravenous Q8H PRN Loletha Grayer, MD   25 mg at 08/05/15 0105  . traZODone (DESYREL) tablet 100 mg  100 mg Per Tube QHS Loletha Grayer, MD   100 mg at 08/04/15 2329    OBJECTIVE: Filed Vitals:   08/05/15 0932  BP: 164/92  Pulse: 77  Temp: 97.8 F (36.6 C)  Resp: 18     Body mass index is 17.2 kg/(m^2).    ECOG FS:1 - Symptomatic but completely ambulatory  General: Well-developed, well-nourished, no acute distress. Eyes: Pink conjunctiva, anicteric sclera. Lungs: Clear to auscultation bilaterally. Heart: Regular rate and rhythm. No rubs, murmurs, or gallops. Abdomen: Soft, normoactive bowel sounds, PEG tube noted without erythema. Musculoskeletal: No edema, cyanosis, or clubbing. Neuro: Alert, answering all questions appropriately. Cranial nerves grossly intact. Skin: No rashes or petechiae noted. Psych: Normal affect.  LAB RESULTS:  Lab Results  Component Value Date   NA 135 08/04/2015   K 3.7 08/04/2015   CL 103 08/04/2015   CO2 25 08/04/2015   GLUCOSE 74 08/04/2015   BUN 15 08/04/2015   CREATININE 0.57* 08/04/2015   CALCIUM 8.4* 08/04/2015   PROT 6.3* 08/03/2015   ALBUMIN 2.9* 08/03/2015   AST 21 08/03/2015   ALT 12* 08/03/2015   ALKPHOS 76 08/03/2015   BILITOT 1.0 08/03/2015   GFRNONAA >60 08/04/2015   GFRAA >60 08/04/2015    Lab Results  Component Value Date   WBC 9.3 08/04/2015   NEUTROABS 8.4* 08/03/2015   HGB 9.2* 08/04/2015   HCT 27.8* 08/04/2015   MCV 96.9 08/04/2015   PLT 201 08/04/2015     STUDIES: Ct Abdomen Pelvis W Contrast  07/10/2015   CLINICAL DATA:  Head and neck cancer. Weight loss. Percutaneous gastrostomy tube.  EXAM: CT ABDOMEN AND PELVIS WITH CONTRAST  TECHNIQUE: Multidetector CT imaging of the abdomen and pelvis was performed  using the standard protocol following bolus administration of intravenous contrast.  CONTRAST:  17m OMNIPAQUE IOHEXOL 300 MG/ML  SOLN  COMPARISON:  PET-CT 06/14/1999 16  FINDINGS: Lower chest: Bibasilar linear scarring at the lung bases. No airspace disease to suggest active infection. Mild bronchiectasis the lung bases.  Hepatobiliary: No focal hepatic lesion. No biliary duct dilatation. Gallbladder is normal. Common bile duct is normal.  Pancreas: Pancreas is normal. No ductal dilatation. No pancreatic inflammation.  Spleen: Normal spleen  Adrenals/urinary tract: Adrenal glands and kidneys are normal. The ureters and bladder normal.  Stomach/Bowel: Percutaneous gastrostomy tube within the stomach. The retention bulb is positioned properly within the gastric body. No evidence of leak along the course of the gastrostomy tube. Contrast flows distally through the small bowel without obstruction. The appendix and cecum are normal. The colon and rectosigmoid colon are normal.  Vascular/Lymphatic: Abdominal aorta is normal caliber with atherosclerotic calcification. There is no retroperitoneal or periportal lymphadenopathy. No pelvic lymphadenopathy.  Reproductive: Prostate gland is normal.  Musculoskeletal: RIGHT hip arthroplasty. Avascular necrosis of the LEFT femoral head. No aggressive osseous lesion.  Other: No free fluid.  IMPRESSION: 1. Bibasilar linear scarring may well represent sequelae of prior aspiration pneumonitis; however there is no evidence of active pneumonia or pneumonitis. 2. Percutaneous gastrostomy tube appears in good position without evidence of malposition or infection. 3. No bowel obstruction. 4. No evidence of abscess or inflammation the abdomen or pelvis. 5.  Atherosclerotic calcification of the abdominal aorta.   Electronically Signed   By: Suzy Bouchard M.D.   On: 07/10/2015 18:55   Dg Abd Acute W/chest  08/03/2015   CLINICAL DATA:  History of head and neck cancer. The patient is on  chemotherapy with the last treatment 07/30/2015. Weakness and vomiting since that time. Weight loss. Cough.  EXAM: DG ABDOMEN ACUTE W/ 1V CHEST  COMPARISON:  CT abdomen and pelvis 05/10/2015. PET CT scan 06/14/2015.  FINDINGS: Single view of the chest demonstrates a Port-A-Cath in place from a right IJ approach. The lungs appear emphysematous but are clear. There is no pneumothorax or pleural effusion. Heart size is normal.  Two views of the abdomen show no free intraperitoneal air. The bowel gas pattern is unremarkable. Surgical clips in the upper abdomen and right hip replacement are noted. 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  IMPRESSION: No acute finding chest or abdomen.  Emphysema.  Avascular necrosis left hip.   Electronically Signed   By: Inge Rise M.D.   On: 08/03/2015 11:29    ASSESSMENT: Head and neck cancer, intractable nausea and vomiting.  PLAN:    1. Head and neck cancer: Patient last received chemotherapy with cisplatin on Monday, July 30, 2015. His next scheduled chemo is on September 26th, but given his acute symptoms this appointment likely will be postponed. Patient is also receiving daily XRT which he has not received since Wednesday. Will also recommend holding radiation until acute symptoms resolve. 2. Intractable nausea and vomiting: Unchanged. Unlikely, but possibly, related to cisplatin as this is a low dose and patient has been tolerating his treatments up to this point.  CT scan results reviewed independently. PEG appears to be in good position. Continue scheduled Reglan and Zofran. Recommend GI consult for further evaluation.  Will follow.  Lloyd Huger, MD   08/05/2015 11:05 AM

## 2015-08-06 ENCOUNTER — Other Ambulatory Visit: Payer: Medicare Other

## 2015-08-06 ENCOUNTER — Inpatient Hospital Stay: Payer: Medicare Other

## 2015-08-06 ENCOUNTER — Ambulatory Visit: Payer: Medicare Other | Admitting: Oncology

## 2015-08-06 ENCOUNTER — Ambulatory Visit: Payer: Medicare Other

## 2015-08-06 ENCOUNTER — Inpatient Hospital Stay: Payer: Medicare Other | Admitting: Oncology

## 2015-08-06 MED ORDER — JEVITY 1.5 CAL/FIBER PO LIQD
237.0000 mL | ORAL | Status: DC
  Administered 2015-08-08 – 2015-08-09 (×6): 237 mL

## 2015-08-06 MED ORDER — MORPHINE SULFATE (PF) 2 MG/ML IV SOLN: 2.0000 mg | Freq: Once | INTRAVENOUS | Status: DC

## 2015-08-06 MED ORDER — MORPHINE SULFATE (PF) 2 MG/ML IV SOLN
2.0000 mg | Freq: Once | INTRAVENOUS | Status: AC
  Administered 2015-08-06: 03:00:00 2 mg via INTRAVENOUS
  Filled 2015-08-06: qty 1

## 2015-08-06 MED ORDER — MORPHINE SULFATE (PF) 2 MG/ML IV SOLN
2.0000 mg | INTRAVENOUS | Status: DC | PRN
  Administered 2015-08-06 – 2015-08-09 (×23): 2 mg via INTRAVENOUS
  Filled 2015-08-06 (×22): qty 1

## 2015-08-06 MED ORDER — LABETALOL HCL 5 MG/ML IV SOLN
10.0000 mg | Freq: Four times a day (QID) | INTRAVENOUS | Status: DC | PRN
  Administered 2015-08-06 (×2): 10 mg via INTRAVENOUS
  Administered 2015-08-07: 5 mg via INTRAVENOUS
  Filled 2015-08-06 (×5): qty 4

## 2015-08-06 NOTE — Care Management Important Message (Signed)
Important Message  Patient Details  Name: Grant Lee MRN: [id number] Date of Birth: [birth year]   Medicare Important Message Given:  Yes-second notification given    [name] A Allmond 08/06/2015, 10:15 AM

## 2015-08-06 NOTE — Progress Notes (Signed)
[facility]  Patient Status: active   [facility] is providing the following services: SN/PT  If patient discharges after hours, please call [phone].   Grant Lee 08/06/2015, 10:29 AM

## 2015-08-06 NOTE — Progress Notes (Signed)
Brief Nutrition Follow-Up Note:   EN: RD notes plan for GI to schedule adjustment and repositioning of PEG tube as able. Will continue to follow poc and make recommendations accordingly. RD notes previous TF order has been discontinued. Will restart at half cans when able pending altered GI function and MD agreeable.   Dwyane Luo, [place], LDN Pager [phone]

## 2015-08-06 NOTE — Plan of Care (Signed)
Problem: Discharge Progression Outcomes Goal: Other Discharge Outcomes/Goals Outcome: Progressing Plan of care progress to goals: 1. Throat pain managed by PRN pain medications w/ noted relief.  2. Hemodynamically:             -VSS, afebrile              -IVF infusing as ordered              -Nausea managed by scheduled Zofran & Reglan. No emesis throughout the shift.              -Dr. Gustavo Lah came to floor around 2200 to assess pt peg tube. Verbal order to no longer use peg tube for medications or free water flushes until 9/26 due to peg tube currently not in appropriate location. Per Dr. Marton Redwood note plan to "use radiology & fluoroscopy assistance locating this and try to gently bring it back up its appropriate location." 3. Refuses bed alarm. High fall risk. Pt asked to call for assistance out of bed for safety.

## 2015-08-06 NOTE — Plan of Care (Addendum)
Problem: Discharge Progression Outcomes Goal: Other Discharge Outcomes/Goals Outcome: Progressing Plan of care progress to goal: 1. Throat pain managed by PRN pain medications w/ noted relief.   2. Hemodynamically:             -BP elevated, labetalol given PRN for SBP >170, BP decreased. Afebrile               -IVF infusing as ordered  3. Remains NPO. Nausea managed by scheduled Zofran & Reglan. No emesis throughout the shift.             -Per Dr. Gustavo Lah, no longer use peg tube for medications or free water flushes until pt gets PEG tube repositioned.                 Dr. Gustavo Lah came to unit and readjusted tube to proper spot. Okay to use for feeds and meds per MD.  4. Refuses bed alarm. High fall risk. Pt asked to call for assistance out of bed for safety.

## 2015-08-06 NOTE — Care Management (Signed)
Admitted to [facility] with the diagnosis of chemotherapy inducted nausea/vomitting. Lives with wife, Altha Harm [phone]).  PEG placed 07/06/15. Followed by [facility] since 07/08/15. Last seen Dr, Ovid Curd 3-4 weeks ago. Last seen Dr. Jeb Levering 2 weeks ago. No skilled facility. No home oxygen. No CPAP or BIPAP. Uses a cane and rolling walker to aid in ambulation. Takes care of all basic activities of daily living himself, can drive, doesn't. No falls  Family will transport. Shelbie Ammons RN MSN Care Management [phone]

## 2015-08-06 NOTE — Consult Note (Signed)
Subjective: Patient seen for nausea and emesis.  peg noted to have migrated distally with possible blocking of the gastric outlet. Patient has been npo/peg feeds held overnight.  Peg was repositioned and advice given for maintaining position.  Tolerated clear water, trial of tube feeds given.  Denies abdominal pain.    Objective: Vital signs in last 24 hours: Temp:  [98.4 F (36.9 C)-99 F (37.2 C)] 98.4 F (36.9 C) (09/26 1323) Pulse Rate:  [78-83] 83 (09/26 1505) Resp:  [18] 18 (09/26 1505) BP: (158-172)/(80-105) 167/94 mmHg (09/26 1505) SpO2:  [97 %-99 %] 97 % (09/26 1323) Blood pressure 167/94, pulse 83, temperature 98.4 F (36.9 C), temperature source Oral, resp. rate 18, height '6\' 2"'$  (1.88 m), weight 60.782 kg (134 lb), SpO2 97 %.   Intake/Output from previous day: 09/25 0701 - 09/26 0700 In: 800 [NG/GT:200] Out: -   Intake/Output this shift:     General appearance:  Thin male, nad Resp:  cta Cardio:  rrr GI:  Soft, non-tender nondistended, bowel sounds positive Extremities:  No cce   Lab Results: No results found for this or any previous visit (from the past 24 hour(s)).    Recent Labs  08/04/15 0533  WBC 9.3  HGB 9.2*  HCT 27.8*  PLT 201   BMET  Recent Labs  08/04/15 0533  NA 135  K 3.7  CL 103  CO2 25  GLUCOSE 74  BUN 15  CREATININE 0.57*  CALCIUM 8.4*   LFT No results for input(s): PROT, ALBUMIN, AST, ALT, ALKPHOS, BILITOT, BILIDIR, IBILI in the last 72 hours. PT/INR No results for input(s): LABPROT, INR in the last 72 hours. Hepatitis Panel No results for input(s): HEPBSAG, HCVAB, HEPAIGM, HEPBIGM in the last 72 hours. C-Diff No results for input(s): CDIFFTOX in the last 72 hours. No results for input(s): CDIFFPCR in the last 72 hours.   Studies/Results: No results found.  Scheduled Inpatient Medications:   . antiseptic oral rinse  7 mL Mouth Rinse q12n4p  . citalopram  20 mg Per Tube Daily  . enoxaparin (LOVENOX) injection  40 mg  Subcutaneous Q24H  . feeding supplement (JEVITY 1.5 CAL/FIBER)  237 mL Per Tube Q4H  . free water  200 mL Per Tube Q6H  . Influenza vac split quadrivalent PF  0.5 mL Intramuscular Tomorrow-1000  . lisinopril  10 mg Per Tube Daily  . metoCLOPramide (REGLAN) injection  10 mg Intravenous 4 times per day  . ondansetron (ZOFRAN) IV  4 mg Intravenous 4 times per day  . pravastatin  40 mg Per Tube q1800  . traZODone  100 mg Per Tube QHS    Continuous Inpatient Infusions:   . 0.9 % NaCl with KCl 20 mEq / L 125 mL/hr at 08/06/15 1132    PRN Inpatient Medications:  labetalol, morphine injection, oxyCODONE, promethazine  Miscellaneous:   Assessment:  1) nausea and emesis, s/p restarting cisplatin.  Possible gastric outlet obstruction from peg balloon migration.   Patient with some  Recurrent nausea after trial of feeds through PEG.    Plan:  1) will plan for egd tomorrow.   I have discussed the risks benefits and complications of procedures to include not limited to bleeding, infection, perforation and the risk of sedation and the patient wishes to proceed.  Lollie Sails MD 08/06/2015, 8:57 PM

## 2015-08-06 NOTE — Progress Notes (Signed)
[facility] at [place] NAME: Grant Lee    MR#:  [id number]  DATE OF BIRTH:  13-Nov-1955  SUBJECTIVE:  CHIEF COMPLAINT:   Chief Complaint  Patient presents with  . Fatigue  admitted for nausea/vomiting. Afebrile. No abd pain. Still has significant nausea. NPO Waiting for PEG repositioning  REVIEW OF SYSTEMS:    Review of Systems  Constitutional: Positive for malaise/fatigue. Negative for fever and chills.  HENT: Positive for sore throat.   Eyes: Negative for blurred vision, double vision and pain.  Respiratory: Positive for cough. Negative for hemoptysis, shortness of breath and wheezing.   Cardiovascular: Negative for chest pain, palpitations, orthopnea and leg swelling.  Gastrointestinal: Positive for heartburn, nausea, vomiting, abdominal pain and constipation. Negative for diarrhea.  Genitourinary: Negative for dysuria and hematuria.  Musculoskeletal: Negative for back pain and joint pain.  Skin: Negative for rash.  Neurological: Positive for weakness. Negative for sensory change, speech change, focal weakness and headaches.  Endo/Heme/Allergies: Does not bruise/bleed easily.  Psychiatric/Behavioral: Negative for depression. The patient is not nervous/anxious.       DRUG ALLERGIES:  No Known Allergies  VITALS:  Blood pressure 163/95, pulse 81, temperature 99 F (37.2 C), temperature source Oral, resp. rate 18, height '6\' 2"'$  (1.88 m), weight 60.782 kg (134 lb), SpO2 98 %.  PHYSICAL EXAMINATION:   Physical Exam  GENERAL:  59 y.o.-year-old patient lying in the bed with no acute distress.  EYES: Pupils equal, round, reactive to light and accommodation. No scleral icterus. Extraocular muscles intact.  HEENT: Head atraumatic, normocephalic. Oropharynx and nasopharynx clear. Dry oral mucosa NECK:  Supple, no jugular venous distention. No thyroid enlargement, no tenderness.  LUNGS: Normal breath sounds bilaterally,  no wheezing, rales, rhonchi. No use of accessory muscles of respiration.  CARDIOVASCULAR: S1, S2 normal. No murmurs, rubs, or gallops.  ABDOMEN: Soft, nontender, nondistended. Bowel sounds present. No organomegaly or mass. PEG tube EXTREMITIES: No cyanosis, clubbing or edema b/l.    NEUROLOGIC: Cranial nerves II through XII are intact. No focal Motor or sensory deficits b/l.   PSYCHIATRIC: The patient is alert and oriented x 3.  SKIN: No obvious rash, lesion, or ulcer.    LABORATORY PANEL:   CBC  Recent Labs Lab 08/04/15 0533  WBC 9.3  HGB 9.2*  HCT 27.8*  PLT 201   ------------------------------------------------------------------------------------------------------------------  Chemistries   Recent Labs Lab 08/03/15 1130 08/04/15 0533 08/05/15 0412  NA 130* 135  --   K 3.4* 3.7  --   CL 94* 103  --   CO2 27 25  --   GLUCOSE 89 74  --   BUN 21* 15  --   CREATININE 0.67 0.57*  --   CALCIUM 8.4* 8.4*  --   MG  --   --  1.7  AST 21  --   --   ALT 12*  --   --   ALKPHOS 76  --   --   BILITOT 1.0  --   --    ------------------------------------------------------------------------------------------------------------------  Cardiac Enzymes No results for input(s): TROPONINI in the last 168 hours. ------------------------------------------------------------------------------------------------------------------  RADIOLOGY:  No results found.   ASSESSMENT AND PLAN:   1. Nausea and vomiting- Likely Chemotherapy-induced Started free water flushes in PEG and patient not tolerating. Discussed with Dr. Grayland Ormond. Cisplantin was low dose and unlikely to cause significant vomiting. PEG needs repositioning. Discussed with Dr. Gustavo Lah and he is discussing with radiology to schedule this.  2.  Hypokalemia- replace potassium in IV fluids  3. Essential hypertension continue lisinopril  4. Hyperlipidemia unspecified continue pravastatin  5. Depression on Celexa  6.  Chronic pain on oxycodone art home. Morphine IV here as he is NPO  7. Dehydration On IVF   All the records are reviewed and case discussed with Care Management/Social Workerr. Management plans discussed with the patient, family and they are in agreement.  CODE STATUS: FULL  DVT Prophylaxis: SCDs  TOTAL TIME TAKING CARE OF THIS PATIENT: 35 minutes.   POSSIBLE D/C IN 1-2  DAYS, DEPENDING ON CLINICAL CONDITION.   Hillary Bow R M.D on 08/06/2015 at 12:09 PM  Between 7am to 6pm - Pager - [phone]  After 6pm go to [email or web address] - password [email or web address] [facility]  Office  [phone]  CC: Primary care physician; Cyndi Bender, [name]

## 2015-08-06 NOTE — Progress Notes (Signed)
[place] @ [facility] [phone]  Fax:([phone] [name] OB: May 09, 1956  MR#: [id number]  VVO#:[id number]  Patient Care Team: Cyndi Bender, PA-C as PCP - General (Physician Assistant) Beverly Gust, MD (Unknown Physician Specialty)  CHIEF COMPLAINT:  Chief Complaint  Patient presents with  . Fatigue   1.  Has a history of cancer of the tongue in 2006.  Patient underwent resection followed by radiation therapy 2.  Increasing difficulty in swallowing for last 2 or 3 weeks.  Patient had upper endoscopy done in December of 2015. 3.  Significant weight loss 4.  Abnormal PET scan (August of 2016) 5.  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  (Cis-platinum and radiation therapy) (September 2 016) T3 N0 M0 tumor Status post PEG tube placement (September, 2016)  VISIT DIAGNOSIS:  Significant weight loss. Difficulty swallowing. History of carcinoma of tongue    Oncology Flowsheet 08/05/2015 08/05/2015 08/05/2015 08/05/2015 08/06/2015 08/06/2015 08/06/2015  Day, Cycle - - - - - - -  CISplatin (PLATINOL) IV - - - - - - -  dexamethasone (DECADRON) IJ - - - - - - -  dexamethasone (DECADRON) IV - - - - - - -  enoxaparin (LOVENOX) [place]         40 mg      fosaprepitant (EMEND) IV - - - - - - -  granisetron (KYTRIL) PO - - - - - - -  LORazepam (ATIVAN) IV         - - -  metoCLOPramide (REGLAN) IV 10 mg 10 mg 10 mg   10 mg 10 mg 10 mg  ondansetron (ZOFRAN) IV 4 mg 4 mg 4 mg 4 mg 4 mg 4 mg 4 mg  palonosetron (ALOXI) IV - - - - - - -  promethazine (PHENERGAN) IM - - - - - - -  promethazine (PHENERGAN) IV         - - -    INTERVAL HISTORY:  59 year old gentleman with persistent nausea at second primary at the base of the tongue on the left side patient is getting radiation and cis-platinum chemotherapy patient was admitted with persistent nausea vomiting undergone upper  endoscopy with PEG tube placement. Patient has been receiving IV fluid.  Has lost significant weight.   REVIEW OF SYSTEMS:   Declining performance status.  Significant weight loss.  Persistent nausea. Getting radiation therapy  PAST MEDICAL HISTORY: Carcinoma of tongue Hypertension Hypercholesterolemia Previous substance abuse Gastroesophageal reflux disease  PAST SURGICAL HISTORY: Treated for carcinoma of tongue FAMILY HISTORY There is no significant family history of breast cancer, ovarian cancer, colon cancer    ADVANCED DIRECTIVES:  Patient does not have any living will or healthcare power of attorney.  Information was given .  Available resources had been discussed.  We will follow-up on subsequent appointments regarding this issue  HEALTH MAINTENANCE: Social History  Substance Use Topics  . Smoking status: Former Smoker -- 1.00 packs/day for 0 years    Types: Cigarettes    Quit date: 09/21/2014  . Smokeless tobacco: None  . Alcohol Use: Yes     Comment: beer/wine every day    Quit smoking in November of 2015.  History of smoking for several years in the past.  No Known Allergies  Current Facility-Administered Medications  Medication Dose Route Frequency Milanie Rosenfield Last Rate Last Dose  . 0.9 % NaCl with KCl 20 mEq/ L  infusion   Intravenous Continuous Hillary Bow, MD 125 mL/hr at 08/06/15 1132    . antiseptic oral rinse (CPC / CETYLPYRIDINIUM CHLORIDE 0.05%) solution 7 mL  7 mL Mouth Rinse q12n4p Srikar Sudini, MD   7 mL at 08/06/15 1600  . citalopram (CELEXA) tablet 20 mg  20 mg Per Tube Daily Loletha Grayer, MD   20 mg at 08/05/15 0936  . enoxaparin (LOVENOX) injection 40 mg  40 mg Subcutaneous Q24H Loletha Grayer, MD   40 mg at 08/06/15 1359  . feeding supplement (JEVITY 1.5 CAL/FIBER) liquid 237 mL  237 mL Per Tube Q4H Lollie Sails, MD   237 mL at 08/06/15 1842  . free water 200 mL  200 mL Per Tube Q6H Srikar Sudini, MD   200 mL at 08/06/15 1643  .  Influenza vac split quadrivalent PF (FLUARIX) injection 0.5 mL  0.5 mL Intramuscular Tomorrow-1000 Loletha Grayer, MD      . labetalol (NORMODYNE,TRANDATE) injection 10 mg  10 mg Intravenous Q6H PRN Hillary Bow, MD   10 mg at 08/06/15 1359  . lisinopril (PRINIVIL,ZESTRIL) tablet 10 mg  10 mg Per Tube Daily Loletha Grayer, MD   10 mg at 08/05/15 0936  . metoCLOPramide (REGLAN) injection 10 mg  10 mg Intravenous 4 times per day Lloyd Huger, MD   10 mg at 08/06/15 1815  . morphine 2 MG/ML injection 2 mg  2 mg Intravenous Q2H PRN Harrie Foreman, MD   2 mg at 08/06/15 1815  . ondansetron (ZOFRAN) injection 4 mg  4 mg Intravenous 4 times per day Loletha Grayer, MD   4 mg at 08/06/15 1815  . oxyCODONE (Oxy IR/ROXICODONE) immediate release tablet 30 mg  30 mg Per Tube Q4H PRN Loletha Grayer, MD   30 mg at 08/05/15 1818  . pravastatin (PRAVACHOL) tablet 40 mg  40 mg Per Tube q1800 Loletha Grayer, MD   40 mg at 08/05/15 1817  . promethazine (PHENERGAN) injection 25 mg  25 mg Intravenous Q8H PRN Loletha Grayer, MD   25 mg at 08/05/15 0105  . traZODone (DESYREL) tablet 100 mg  100 mg Per Tube QHS Loletha Grayer, MD   100 mg at 08/05/15 1953    OBJECTIVE: PHYSICAL EXAM: Gen. status: Patient is seen lean and cachectic. HEENT: No soreness in the mouth.  But swelling which is diffuse Lymphatic system: Supraclavicular, cervical, axillary, inguinal lymph nodes are not palpable Lungs: Bilateral rhonchi and occasional crepitation. Cardiac: Tachycardia Examination of the skin revealed no evidence of significant rashes, suspicious appearing nevi or other concerning lesions.. Abdominal exam revealed normal bowel sounds. The abdomen was soft, non-tender, and without masses, organomegaly, or appreciable enlargement of the abdominal aorta..   Peg  placement Psychiatric system: Depression and anxiety  Filed Vitals:   08/06/15 1505  BP: 167/94  Pulse: 83  Temp:   Resp: 18     Body mass index is  17.2 kg/(m^2).    ECOG FS:1 - Symptomatic but completely ambulatory  LAB RESULTS:   Chemistry 2 days ago was within normal limit anemia   ASSESSMENT:  A. 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  Carcinoma of tongue in 2006 status post resection and radiation therapy exit staging not known and old records being off pain for review 2.  Difficulty in swallowing 2-3 weeks duration.  Had  a normal upper endoscopy in December of 2015 with esophageal dilated Patient history of peptic ulcer disease 3, significant weight loss 4, previous history of chronic drug abuse.  Chronic tobacco abuse.    PLAN:   Patient has persistent nausea vomiting evaluated by upper endoscopy severe gastritis.  Tumor has been replaced. Continue IV fluid IV H2 blocker Reglan The patient's nausea and vomiting is improved patient can be discharged to continue further treatment     No matching staging information was found for the patient.  Forest Gleason, MD   08/06/2015 7:31 PM

## 2015-08-07 ENCOUNTER — Encounter
Admission: EM | Disposition: A | Discharge status: Home / Self Care | Payer: Self-pay | Source: Home / Self Care | Attending: Internal Medicine

## 2015-08-07 ENCOUNTER — Encounter: Payer: Self-pay | Admitting: *Deleted

## 2015-08-07 ENCOUNTER — Inpatient Hospital Stay: Payer: Medicare Other | Admitting: Anesthesiology

## 2015-08-07 ENCOUNTER — Ambulatory Visit: Payer: Medicare Other

## 2015-08-07 ENCOUNTER — Other Ambulatory Visit: Payer: Medicare Other

## 2015-08-07 HISTORY — PX: ESOPHAGOGASTRODUODENOSCOPY (EGD) WITH PROPOFOL: SHX5813

## 2015-08-07 LAB — CBC
HEMATOCRIT: 29.6 % — AB (ref 40.0–52.0)
HEMOGLOBIN: 9.8 g/dL — AB (ref 13.0–18.0)
MCH: 31.9 pg (ref 26.0–34.0)
MCHC: 33.1 g/dL (ref 32.0–36.0)
MCV: 96.1 fL (ref 80.0–100.0)
Platelets: 251 10*3/uL (ref 150–440)
RBC: 3.07 MIL/uL — ABNORMAL LOW (ref 4.40–5.90)
RDW: 12.8 % (ref 11.5–14.5)
WBC: 5.3 10*3/uL (ref 3.8–10.6)

## 2015-08-07 SURGERY — ESOPHAGOGASTRODUODENOSCOPY (EGD) WITH PROPOFOL
Anesthesia: General

## 2015-08-07 MED ORDER — PROPOFOL 10 MG/ML IV BOLUS
INTRAVENOUS | Status: DC | PRN
  Administered 2015-08-07: 100 mg via INTRAVENOUS

## 2015-08-07 MED ORDER — HYDRALAZINE HCL 20 MG/ML IJ SOLN: 10.0000 mg | Freq: Four times a day (QID) | INTRAMUSCULAR | Status: DC | PRN

## 2015-08-07 MED ORDER — LIDOCAINE HCL (CARDIAC) 20 MG/ML IV SOLN
INTRAVENOUS | Status: DC | PRN
  Administered 2015-08-07: 30 mg via INTRAVENOUS

## 2015-08-07 MED ORDER — DEXTROSE 5 % IV SOLN
500.0000 mg | Freq: Three times a day (TID) | INTRAVENOUS | Status: DC
  Administered 2015-08-07 – 2015-08-09 (×5): 500 mg via INTRAVENOUS
  Filled 2015-08-07 (×9): qty 0.5

## 2015-08-07 MED ORDER — SODIUM CHLORIDE 0.9 % IV SOLN
INTRAVENOUS | Status: DC
  Administered 2015-08-07: 1000 mL via INTRAVENOUS

## 2015-08-07 MED ORDER — PANTOPRAZOLE SODIUM 40 MG IV SOLR
40.0000 mg | Freq: Two times a day (BID) | INTRAVENOUS | Status: DC
  Administered 2015-08-07 – 2015-08-08 (×4): 40 mg via INTRAVENOUS
  Filled 2015-08-07 (×4): qty 40

## 2015-08-07 MED ORDER — DEXTROSE 5 % IV SOLN: 1.0000 g | Freq: Three times a day (TID) | INTRAVENOUS | Status: DC

## 2015-08-07 MED ORDER — SUCRALFATE 1 GM/10ML PO SUSP
1.0000 g | Freq: Three times a day (TID) | ORAL | Status: DC
  Administered 2015-08-07 – 2015-08-09 (×6): 1 g
  Filled 2015-08-07 (×6): qty 10

## 2015-08-07 MED ORDER — PROPOFOL 500 MG/50ML IV EMUL
INTRAVENOUS | Status: DC | PRN
  Administered 2015-08-07: 120 ug/kg/min via INTRAVENOUS

## 2015-08-07 NOTE — Op Note (Signed)
[facility] Gastroenterology Patient Name: Grant Lee Procedure Date: 08/07/2015 6:01 PM MRN: [id number] Account #: [id number] Date of Birth: 1956-08-13 Admit Type: Inpatient Age: 59 Room: [facility] ENDO ROOM 3 Gender: Male Note Status: Finalized Procedure:         Upper GI endoscopy Indications:       Nausea with vomiting Providers:         Lollie Sails, MD Referring MD:      Tana Conch. Leslye Peer, MD (Referring MD) Medicines:         Monitored Anesthesia Care Complications:     No immediate complications. Procedure:         Pre-Anesthesia Assessment:                    - ASA Grade Assessment: III - A patient with severe                     systemic disease.                    After obtaining informed consent, the endoscope was passed                     under direct vision. Throughout the procedure, the                     patient's blood pressure, pulse, and oxygen saturations                     were monitored continuously. The Endoscope was introduced                     through the mouth, and advanced to the second part of                     duodenum. The upper GI endoscopy was accomplished without                     difficulty. The patient tolerated the procedure well. Findings:      LA Grade D (one or more mucosal breaks involving at least 75% of       esophageal circumference) esophagitis with no bleeding was found.      A single, small non-bleeding erosion was found in the cardia.      One non-bleeding cratered gastric ulcer with pigmented material was       found in the prepyloric region of the stomach. The lesion was 4 mm in       largest dimension.      One non-bleeding cratered duodenal ulcer with no stigmata of bleeding       was found in the duodenal bulb. The lesion was 4 mm in largest dimension.      Diffuse moderate inflammation characterized by congestion (edema) and       erythema was found in the first part of the duodenum and in  the second       part of the duodenum.      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 Impression:        - LA Grade D reflux and erosive esophagitis.                    -  Non-bleeding erosive gastropathy.                    - Gastric ulcer.                    - One duodenal ulcer with clean base.                    - Duodenitis.                    - No specimens collected. Recommendation:    - Use Protonix (pantoprazole) 40 mg IV BID.                    - Use sucralfate suspension 1 gram per peg QID.                    - Perform an H. pylori serology today. Procedure Code(s): --- Professional ---                    [phone], Esophagogastroduodenoscopy, flexible, transoral;                     diagnostic, including collection of specimen(s) by                     brushing or washing, when performed (separate procedure) Diagnosis Code(s): --- Professional ---                    530.11, Reflux esophagitis                    530.19, Other esophagitis                    537.9, Unspecified disorder of stomach and duodenum                    531.90, Gastric ulcer, unspecified as acute or chronic,                     without mention of hemorrhage or perforation, without                     mention of obstruction                    532.90, Duodenal ulcer, unspecified as acute or chronic,                     without hemorrhage or perforation, without mention of                     obstruction                    535.60, Duodenitis, without mention of hemorrhage                    787.01, Nausea with vomiting CPT copyright 2014 American Medical Association. All rights reserved. The codes documented in this report are preliminary and upon coder review may  be revised to meet current compliance requirements. Lollie Sails, MD 08/07/2015 6:42:39 PM This report has been signed  electronically. Number of Addenda: 0 Note Initiated On: 08/07/2015 6:01 PM      [facility]

## 2015-08-07 NOTE — Progress Notes (Addendum)
[place] at [place] NAME: Grant Lee    MR#:  [id number]  DATE OF BIRTH:  August 25, 1956  SUBJECTIVE:  CHIEF COMPLAINT:   Chief Complaint  Patient presents with  . Fatigue  admitted for nausea/vomiting. Afebrile. No abd pain. Still has significant nausea. Not tolerating clears. PEG repositioned yesterday. EGD later today.  REVIEW OF SYSTEMS:    Review of Systems  Constitutional: Positive for malaise/fatigue. Negative for fever and chills.  HENT: Positive for sore throat.   Eyes: Negative for blurred vision, double vision and pain.  Respiratory: Positive for cough. Negative for hemoptysis, shortness of breath and wheezing.   Cardiovascular: Negative for chest pain, palpitations, orthopnea and leg swelling.  Gastrointestinal: Positive for heartburn, nausea, vomiting, abdominal pain and constipation. Negative for diarrhea.  Genitourinary: Negative for dysuria and hematuria.  Musculoskeletal: Negative for back pain and joint pain.  Skin: Negative for rash.  Neurological: Positive for weakness. Negative for sensory change, speech change, focal weakness and headaches.  Endo/Heme/Allergies: Does not bruise/bleed easily.  Psychiatric/Behavioral: Negative for depression. The patient is not nervous/anxious.       DRUG ALLERGIES:  No Known Allergies  VITALS:  Blood pressure 168/88, pulse 88, temperature 98.1 F (36.7 C), temperature source Oral, resp. rate 18, height '6\' 2"'$  (1.88 m), weight 60.782 kg (134 lb), SpO2 98 %.  PHYSICAL EXAMINATION:   Physical Exam  GENERAL:  59 y.o.-year-old patient lying in the bed with no acute distress.  EYES: Pupils equal, round, reactive to light and accommodation. No scleral icterus. Extraocular muscles intact.  HEENT: Head atraumatic, normocephalic. Oropharynx and nasopharynx clear. Dry oral mucosa NECK:  Supple, no jugular venous distention. No thyroid enlargement, no tenderness.   LUNGS: Normal breath sounds bilaterally, no wheezing, rales, rhonchi. No use of accessory muscles of respiration.  CARDIOVASCULAR: S1, S2 normal. No murmurs, rubs, or gallops.  ABDOMEN: Soft, nontender, nondistended. Bowel sounds present. No organomegaly or mass. PEG tube EXTREMITIES: No cyanosis, clubbing or edema b/l.    NEUROLOGIC: Cranial nerves II through XII are intact. No focal Motor or sensory deficits b/l.   PSYCHIATRIC: The patient is alert and oriented x 3.  SKIN: No obvious rash, lesion, or ulcer.    LABORATORY PANEL:   CBC  Recent Labs Lab 08/07/15 0411  WBC 5.3  HGB 9.8*  HCT 29.6*  PLT 251   ------------------------------------------------------------------------------------------------------------------  Chemistries   Recent Labs Lab 08/03/15 1130 08/04/15 0533 08/05/15 0412  NA 130* 135  --   K 3.4* 3.7  --   CL 94* 103  --   CO2 27 25  --   GLUCOSE 89 74  --   BUN 21* 15  --   CREATININE 0.67 0.57*  --   CALCIUM 8.4* 8.4*  --   MG  --   --  1.7  AST 21  --   --   ALT 12*  --   --   ALKPHOS 76  --   --   BILITOT 1.0  --   --    ------------------------------------------------------------------------------------------------------------------  Cardiac Enzymes No results for input(s): TROPONINI in the last 168 hours. ------------------------------------------------------------------------------------------------------------------  RADIOLOGY:  No results found.   ASSESSMENT AND PLAN:   1. Nausea and vomiting- Likely Chemotherapy-induced Started free water flushes in PEG and patient not tolerating. Discussed with Dr. Grayland Ormond. Cisplantin was low dose and unlikely to cause significant vomiting. PEG  Repositioned 08/06/2015.  EGD later today.  2. Hypokalemia- replace potassium in  IV fluids  3. Essential hypertension continue lisinopril. Elevated as he is not tolerating pills IV PRN meds.  4. Hyperlipidemia unspecified continue  pravastatin  5. Depression on Celexa  6. Chronic pain on oxycodone art home. Morphine IV here as he is NPO  7. Dehydration On IVF  8. Pseudomonas UTI Will start ceftazidime and treat while in hospital.   All the records are reviewed and case discussed with Care Management/Social Workerr. Management plans discussed with the patient, family and they are in agreement.  CODE STATUS: FULL  DVT Prophylaxis: SCDs  TOTAL TIME TAKING CARE OF THIS PATIENT: 35 minutes.   POSSIBLE D/C IN 1-2  DAYS, DEPENDING ON CLINICAL CONDITION.   Hillary Bow R M.D on 08/07/2015 at 12:13 PM  Between 7am to 6pm - Pager - [phone]  After 6pm go to [email or web address] - password [email or web address] [facility]  Office  [phone]  CC: Primary care physician; Cyndi Bender, [name]

## 2015-08-07 NOTE — Transfer of Care (Signed)
Immediate Anesthesia Transfer of Care Note  Patient: Grant Lee  Procedure(s) Performed: Procedure(s): ESOPHAGOGASTRODUODENOSCOPY (EGD) WITH PROPOFOL (N/A)  Patient Location: PACU  Anesthesia Type:General  Level of Consciousness: awake, alert , oriented and patient cooperative  Airway & Oxygen Therapy: Patient Spontanous Breathing and Patient connected to nasal cannula oxygen  Post-op Assessment: Report given to RN and Post -op Vital signs reviewed and stable  Post vital signs: Reviewed and stable  Last Vitals:  Filed Vitals:   08/07/15 1830  BP: 128/93  Pulse: 89  Temp: 36.1 C  Resp: 16    Complications: No apparent anesthesia complications

## 2015-08-07 NOTE — Anesthesia Preprocedure Evaluation (Signed)
Anesthesia Evaluation  Patient identified by MRN, date of birth, ID band Patient awake    Reviewed: Allergy & Precautions, NPO status , Patient's Chart, lab work & pertinent test results  History of Anesthesia Complications Negative for: history of anesthetic complications  Airway Mallampati: II  TM Distance: >3 FB Neck ROM: Full    Dental  (+) Edentulous Upper, Edentulous Lower   Pulmonary neg pulmonary ROS, former smoker (quit x 10 months),           Cardiovascular hypertension, Pt. on medications      Neuro/Psych negative neurological ROS     GI/Hepatic Neg liver ROS, PUD, GERD  Medicated,  Endo/Other  negative endocrine ROS  Renal/GU negative Renal ROS     Musculoskeletal  (+) Arthritis ,   Abdominal   Peds  Hematology  (+) anemia ,   Anesthesia Other Findings   Reproductive/Obstetrics                             Anesthesia Physical Anesthesia Plan  ASA: III  Anesthesia Plan: General   Post-op Pain Management:    Induction: Intravenous  Airway Management Planned: Nasal Cannula  Additional Equipment:   Intra-op Plan:   Post-operative Plan:   Informed Consent: I have reviewed the patients History and Physical, chart, labs and discussed the procedure including the risks, benefits and alternatives for the proposed anesthesia with the patient or authorized representative who has indicated his/her understanding and acceptance.     Plan Discussed with:   Anesthesia Plan Comments:         Anesthesia Quick Evaluation

## 2015-08-07 NOTE — Clinical Documentation Improvement (Signed)
Internal Medicine  Abnormal Lab/Test Results:  60,000 col ct of Pseudomonas Aeruginosa in urine culture. Thank you  Possible Clinical Conditions associated with below indicators  UTI due to Pseudomonas Aeruginosa  Bacturia  Contanmination  Other Condition  Cannot Clinically Determine      Please exercise your independent, professional judgment when responding. A specific answer is not anticipated or expected.   Thank You,  [place] [phone]

## 2015-08-07 NOTE — Plan of Care (Addendum)
Problem: Discharge Progression Outcomes Goal: Other Discharge Outcomes/Goals Outcome: Progressing Plan of care progress to goals: 1. Throat pain managed by PRN pain medications w/ noted relief.   2. Hemodynamically:             -elevated BP, Labetalol given PRN for SBP>170, BP decreased              -IVF infusing as ordered               -Nausea managed by scheduled Zofran & Reglan. No emesis throughout the shift.               -EGD planned for today, consent signed             -unable to restart tube feedings/free water flushes due to continued nausea  3. Refuses bed alarm. High fall risk. Pt asked to call for assistance out of bed for safety.

## 2015-08-07 NOTE — Progress Notes (Signed)
[place] @ [facility] [phone]  Fax:([phone] [place] OB: May 02, 1956  MR#: [id number]  LFY#:[id number]  Patient Care Team: Cyndi Bender, PA-C as PCP - General (Physician Assistant) Beverly Gust, MD (Unknown Physician Specialty)  CHIEF COMPLAINT:  Chief Complaint  Patient presents with  . Fatigue   1.  Has a history of cancer of the tongue in 2006.  Patient underwent resection followed by radiation therapy 2.  Increasing difficulty in swallowing for last 2 or 3 weeks.  Patient had upper endoscopy done in December of 2015. 3.  Significant weight loss 4.  Abnormal PET scan (August of 2016) 5.  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  (Cis-platinum and radiation therapy) (September 2 016) T3 N0 M0 tumor Status post PEG tube placement (September, 2016)  VISIT DIAGNOSIS:  Significant weight loss. Difficulty swallowing. History of carcinoma of tongue    Oncology Flowsheet 08/05/2015 08/06/2015 08/06/2015 08/06/2015 08/06/2015 08/07/2015 08/07/2015  Day, Cycle - - - - - - -  CISplatin (PLATINOL) IV - - - - - - -  dexamethasone (DECADRON) IJ - - - - - - -  dexamethasone (DECADRON) IV - - - - - - -  enoxaparin (LOVENOX) [place]   40 mg       40 mg    fosaprepitant (EMEND) IV - - - - - - -  granisetron (KYTRIL) PO - - - - - - -  LORazepam (ATIVAN) IV   - - - - - -  metoCLOPramide (REGLAN) IV   10 mg 10 mg 10 mg 10 mg 10 mg 10 mg  ondansetron (ZOFRAN) IV 4 mg 4 mg 4 mg 4 mg 4 mg 4 mg 4 mg  palonosetron (ALOXI) IV - - - - - - -  promethazine (PHENERGAN) IM - - - - - - -  promethazine (PHENERGAN) IV   - - - - - -    INTERVAL HISTORY:  Patient was admitted in the hospital with persistent nausea or progressive weight loss.  Upper endoscopy revealed extensive gastric ulcer in your denial also gastritis.  Was started on IV Protonix.  And Carafate.Marland Kitchen   REVIEW OF  SYSTEMS:   Declining performance status.  Significant weight loss.  Persistent nausea. Getting radiation therapy  PAST MEDICAL HISTORY: Carcinoma of tongue Hypertension Hypercholesterolemia Previous substance abuse Gastroesophageal reflux disease  PAST SURGICAL HISTORY: Treated for carcinoma of tongue FAMILY HISTORY There is no significant family history of breast cancer, ovarian cancer, colon cancer    ADVANCED DIRECTIVES:  Patient does not have any living will or healthcare power of attorney.  Information was given .  Available resources had been discussed.  We will follow-up on subsequent appointments regarding this issue  HEALTH MAINTENANCE: Social History  Substance Use Topics  . Smoking status: Former Smoker -- 1.00 packs/day for 0 years    Types: Cigarettes    Quit date: 09/21/2014  . Smokeless tobacco: None  . Alcohol Use: 7.2 oz/week    12 Cans of beer per week     Comment: beer/wine every day    Quit smoking in November of 2015.  History of smoking for several years in the past.  No Known Allergies  Current Facility-Administered Medications  Medication Dose Route Frequency Provider Last Rate Last Dose  . 0.9 % NaCl with KCl 20 mEq/ L  infusion   Intravenous Continuous Hillary Bow, MD 75 mL/hr at  08/07/15 1249    . antiseptic oral rinse (CPC / CETYLPYRIDINIUM CHLORIDE 0.05%) solution 7 mL  7 mL Mouth Rinse q12n4p Srikar Sudini, MD   7 mL at 08/07/15 1154  . cefTAZidime (FORTAZ) 500 mg in dextrose 5 % 50 mL IVPB  500 mg Intravenous 3 times per day Hillary Bow, MD      . citalopram (CELEXA) tablet 20 mg  20 mg Per Tube Daily Loletha Grayer, MD   Stopped at 08/07/15 [phone]  . enoxaparin (LOVENOX) injection 40 mg  40 mg Subcutaneous Q24H Loletha Grayer, MD   40 mg at 08/07/15 1302  . feeding supplement (JEVITY 1.5 CAL/FIBER) liquid 237 mL  237 mL Per Tube Q4H Lollie Sails, MD   Stopped at 08/07/15 [phone]  . free water 200 mL  200 mL Per Tube Q6H Hillary Bow,  MD   Stopped at 08/07/15 [phone]  . hydrALAZINE (APRESOLINE) injection 10 mg  10 mg Intravenous Q6H PRN Srikar Sudini, MD      . Influenza vac split quadrivalent PF (FLUARIX) injection 0.5 mL  0.5 mL Intramuscular Tomorrow-1000 Loletha Grayer, MD   Stopped at 08/07/15 3235  . labetalol (NORMODYNE,TRANDATE) injection 10 mg  10 mg Intravenous Q6H PRN Hillary Bow, MD   5 mg at 08/07/15 1809  . lisinopril (PRINIVIL,ZESTRIL) tablet 10 mg  10 mg Per Tube Daily Loletha Grayer, MD   Stopped at 08/07/15 [phone]  . metoCLOPramide (REGLAN) injection 10 mg  10 mg Intravenous 4 times per day Lloyd Huger, MD   10 mg at 08/07/15 1254  . morphine 2 MG/ML injection 2 mg  2 mg Intravenous Q2H PRN Harrie Foreman, MD   2 mg at 08/07/15 1605  . ondansetron (ZOFRAN) injection 4 mg  4 mg Intravenous 4 times per day Loletha Grayer, MD   4 mg at 08/07/15 1254  . oxyCODONE (Oxy IR/ROXICODONE) immediate release tablet 30 mg  30 mg Per Tube Q4H PRN Loletha Grayer, MD   30 mg at 08/07/15 1941  . pantoprazole (PROTONIX) injection 40 mg  40 mg Intravenous Q12H Srikar Sudini, MD   40 mg at 08/07/15 1254  . pravastatin (PRAVACHOL) tablet 40 mg  40 mg Per Tube q1800 Loletha Grayer, MD   40 mg at 08/05/15 1817  . promethazine (PHENERGAN) injection 25 mg  25 mg Intravenous Q8H PRN Loletha Grayer, MD   25 mg at 08/05/15 0105  . sucralfate (CARAFATE) 1 GM/10ML suspension 1 g  1 g Per Tube TID WC & HS Lollie Sails, MD      . traZODone (DESYREL) tablet 100 mg  100 mg Per Tube QHS Loletha Grayer, MD   100 mg at 08/05/15 1953    OBJECTIVE: PHYSICAL EXAM: Gen. status: Patient is seen lean and cachectic. HEENT: No soreness in the mouth.  But swelling which is diffuse Lymphatic system: Supraclavicular, cervical, axillary, inguinal lymph nodes are not palpable Lungs: Bilateral rhonchi and occasional crepitation. Cardiac: Tachycardia Examination of the skin revealed no evidence of significant rashes, suspicious appearing  nevi or other concerning lesions.. Abdominal exam revealed normal bowel sounds. The abdomen was soft, non-tender, and without masses, organomegaly, or appreciable enlargement of the abdominal aorta..   Peg  placement Psychiatric system: Depression and anxiety  Filed Vitals:   08/07/15 1900  BP: 151/97  Pulse: 104  Temp:   Resp: 15     Body mass index is 17.2 kg/(m^2).    ECOG FS:1 - Symptomatic but completely ambulatory  LAB RESULTS:  Chemistry 2 days ago was within normal limit anemia   ASSESSMENT:  A. 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  Carcinoma of tongue in 2006 status post resection and radiation therapy exit staging not known and old records being off pain for review 2.  Difficulty in swallowing 2-3 weeks duration.  Had a normal upper endoscopy in December of 2015 with esophageal dilated Patient history of peptic ulcer disease 3, significant weight loss 4, previous history of chronic drug abuse.  Chronic tobacco abuse.    PLAN:   Patient has persistent nausea vomiting evaluated by upper endoscopy severe gastritis.  Tumor has been replaced. Continue IV fluid IV H2 blocker Reglan The patient's nausea and vomiting is improved patient can be discharged to continue further treatment     No matching staging information was found for the patient.  Forest Gleason, MD   08/07/2015 7:53 PM

## 2015-08-07 NOTE — Consult Note (Signed)
Subjective: Patient seen for nausea and vomiting. PEG was.backup into normal position yesterday. However a trial of liquids was not tolerated well. What patient describes as reflux material. Is no epigastric or other abdominal pain.  Objective: Vital signs in last 24 hours: Temp:  [98.1 F (36.7 C)-98.6 F (37 C)] 98.2 F (36.8 C) (09/27 1719) Pulse Rate:  [78-88] 78 (09/27 1719) Resp:  [18-20] 20 (09/27 1719) BP: (165-188)/(88-114) 175/100 mmHg (09/27 1719) SpO2:  [97 %-99 %] 99 % (09/27 1719) Blood pressure 175/100, pulse 78, temperature 98.2 F (36.8 C), temperature source Tympanic, resp. rate 20, height '6\' 2"'$  (1.88 m), weight 60.782 kg (134 lb), SpO2 99 %.   Intake/Output from previous day:    Intake/Output this shift:     General appearance:  59 year old male Resp:  Clear to auscultation Cardio:  Rate and rhythm GI:  Nontender nondistended bowel sounds positive normoactive Extremities:  No clubbing cyanosis or edema   Lab Results: Results for orders placed or performed during the hospital encounter of 08/03/15 (from the past 24 hour(s))  CBC     Status: Abnormal   Collection Time: 08/07/15  4:11 AM  Result Value Ref Range   WBC 5.3 3.8 - 10.6 K/uL   RBC 3.07 (L) 4.40 - 5.90 MIL/uL   Hemoglobin 9.8 (L) 13.0 - 18.0 g/dL   HCT 29.6 (L) 40.0 - 52.0 %   MCV 96.1 80.0 - 100.0 fL   MCH 31.9 26.0 - 34.0 pg   MCHC 33.1 32.0 - 36.0 g/dL   RDW 12.8 11.5 - 14.5 %   Platelets 251 150 - 440 K/uL      Recent Labs  08/07/15 0411  WBC 5.3  HGB 9.8*  HCT 29.6*  PLT 251   BMET No results for input(s): NA, K, CL, CO2, GLUCOSE, BUN, CREATININE, CALCIUM in the last 72 hours. LFT No results for input(s): PROT, ALBUMIN, AST, ALT, ALKPHOS, BILITOT, BILIDIR, IBILI in the last 72 hours. PT/INR No results for input(s): LABPROT, INR in the last 72 hours. Hepatitis Panel No results for input(s): HEPBSAG, HCVAB, HEPAIGM, HEPBIGM in the last 72 hours. C-Diff No results for  input(s): CDIFFTOX in the last 72 hours. No results for input(s): CDIFFPCR in the last 72 hours.   Studies/Results: No results found.  Scheduled Inpatient Medications:   . [facility] Hold] antiseptic oral rinse  7 mL Mouth Rinse q12n4p  . [MAR Hold] cefTAZidime (FORTAZ)  IV  500 mg Intravenous 3 times per day  . [MAR Hold] citalopram  20 mg Per Tube Daily  . [MAR Hold] enoxaparin (LOVENOX) injection  40 mg Subcutaneous Q24H  . [MAR Hold] feeding supplement (JEVITY 1.5 CAL/FIBER)  237 mL Per Tube Q4H  . [MAR Hold] free water  200 mL Per Tube Q6H  . [MAR Hold] Influenza vac split quadrivalent PF  0.5 mL Intramuscular Tomorrow-1000  . [MAR Hold] lisinopril  10 mg Per Tube Daily  . [MAR Hold] metoCLOPramide (REGLAN) injection  10 mg Intravenous 4 times per day  . [MAR Hold] ondansetron (ZOFRAN) IV  4 mg Intravenous 4 times per day  . [MAR Hold] pantoprazole (PROTONIX) IV  40 mg Intravenous Q12H  . [MAR Hold] pravastatin  40 mg Per Tube q1800  . [MAR Hold] traZODone  100 mg Per Tube QHS    Continuous Inpatient Infusions:   . sodium chloride 1,000 mL (08/07/15 1735)  . 0.9 % NaCl with KCl 20 mEq / L 75 mL/hr at 08/07/15 1249  PRN Inpatient Medications:  [MAR Hold] hydrALAZINE, [MAR Hold] labetalol, [MAR Hold]  morphine injection, [MAR Hold] oxyCODONE, [MAR Hold] promethazine  Miscellaneous:   Assessment:  1. Nausea vomiting intolerance of tube feedings  Plan:  1. EGD today further recommendations to follow. I have discussed the risks benefits and complications of procedures to include not limited to bleeding, infection, perforation and the risk of sedation and the patient wishes to proceed.  Lollie Sails MD 08/07/2015, 6:02 PM

## 2015-08-07 NOTE — Progress Notes (Signed)
Brief Nutrition Note:  Plan for EGD today per MD notes. Per nsg notes, pt unable to restart tube feedings/free water due to continued nausea. Will reassess nutrition poc post EGD  Kerman Passey MS, RD, LDN [phone] Pager

## 2015-08-08 ENCOUNTER — Encounter: Payer: Self-pay | Admitting: Vascular Surgery

## 2015-08-08 ENCOUNTER — Ambulatory Visit: Payer: Medicare Other

## 2015-08-08 DIAGNOSIS — K209 Esophagitis, unspecified without bleeding: Secondary | ICD-10-CM | POA: Insufficient documentation

## 2015-08-08 DIAGNOSIS — K279 Peptic ulcer, site unspecified, unspecified as acute or chronic, without hemorrhage or perforation: Secondary | ICD-10-CM | POA: Diagnosis present

## 2015-08-08 DIAGNOSIS — G894 Chronic pain syndrome: Secondary | ICD-10-CM | POA: Diagnosis present

## 2015-08-08 HISTORY — DX: Peptic ulcer, site unspecified, unspecified as acute or chronic, without hemorrhage or perforation: K27.9

## 2015-08-08 HISTORY — DX: Chronic pain syndrome: G89.4

## 2015-08-08 LAB — BASIC METABOLIC PANEL
Anion gap: 11 (ref 5–15)
BUN: <5 mg/dL — ABNORMAL LOW (ref 6–20)
CHLORIDE: 102 mmol/L (ref 101–111)
CO2: 19 mmol/L — AB (ref 22–32)
Calcium: 8.3 mg/dL — ABNORMAL LOW (ref 8.9–10.3)
Creatinine, Ser: 0.6 mg/dL — ABNORMAL LOW (ref 0.61–1.24)
GFR calc non Af Amer: >60 mL/min (ref >60)
Glucose, Bld: 68 mg/dL (ref 65–99)
POTASSIUM: 4 mmol/L (ref 3.5–5.1)
Sodium: 132 mmol/L — ABNORMAL LOW (ref 135–145)

## 2015-08-08 LAB — URINE CULTURE: Culture: 60000

## 2015-08-08 MED ORDER — ONDANSETRON HCL 4 MG PO TABS: 4.0000 mg | ORAL_TABLET | ORAL | Status: AC | PRN

## 2015-08-08 MED ORDER — OXYCODONE-ACETAMINOPHEN 7.5-325 MG PO TABS: 1.0000 | ORAL_TABLET | ORAL | Status: DC | PRN

## 2015-08-08 MED ORDER — PROMETHAZINE HCL 25 MG RE SUPP: 25.0000 mg | Freq: Three times a day (TID) | RECTAL | Status: DC | PRN

## 2015-08-08 MED ORDER — PANTOPRAZOLE SODIUM 40 MG PO TBEC: 40.0000 mg | DELAYED_RELEASE_TABLET | Freq: Two times a day (BID) | ORAL | Status: DC

## 2015-08-08 MED ORDER — SUCRALFATE 1 G PO TABS: 1.0000 g | ORAL_TABLET | Freq: Three times a day (TID) | ORAL | Status: DC

## 2015-08-08 NOTE — Clinical Documentation Improvement (Signed)
Family Medicine  Patient's BMI 17.2. Please document clinical condition if appropriate for this admission.  Thank you   Underweight  Cachexia  Other  Clinically Undetermined  Supporting Information:  BMI < 19   Please exercise your independent, professional judgment when responding. A specific answer is not anticipated or expected.   Thank You, [place] [phone]

## 2015-08-08 NOTE — Progress Notes (Signed)
[place] [facility] [place] NAME: Grant Lee    MR#:  [id number]  DATE OF BIRTH:  28-Dec-1955  SUBJECTIVE:  CHIEF COMPLAINT:   Chief Complaint  Patient presents with  . Fatigue  admitted for nausea/vomiting. Afebrile. No abd pain. Still has significant nausea.  Tolerating some TFs. EGD showed gastric and duodenal ulcers with esophagitis  REVIEW OF SYSTEMS:    Review of Systems  Constitutional: Positive for malaise/fatigue. Negative for fever and chills.  HENT: Positive for sore throat.   Eyes: Negative for blurred vision, double vision and pain.  Respiratory: Positive for cough. Negative for hemoptysis, shortness of breath and wheezing.   Cardiovascular: Negative for chest pain, palpitations, orthopnea and leg swelling.  Gastrointestinal: Positive for heartburn, nausea, vomiting, abdominal pain and constipation. Negative for diarrhea.  Genitourinary: Negative for dysuria and hematuria.  Musculoskeletal: Negative for back pain and joint pain.  Skin: Negative for rash.  Neurological: Positive for weakness. Negative for sensory change, speech change, focal weakness and headaches.  Endo/Heme/Allergies: Does not bruise/bleed easily.  Psychiatric/Behavioral: Negative for depression. The patient is not nervous/anxious.       DRUG ALLERGIES:  No Known Allergies  VITALS:  Blood pressure 117/78, pulse 87, temperature 98.2 F (36.8 C), temperature source Oral, resp. rate 18, height '6\' 2"'$  (1.88 m), weight 60.782 kg (134 lb), SpO2 97 %.  PHYSICAL EXAMINATION:   Physical Exam  GENERAL:  59 y.o.-year-old patient lying in the bed with no acute distress.  EYES: Pupils equal, round, reactive to light and accommodation. No scleral icterus. Extraocular muscles intact.  HEENT: Head atraumatic, normocephalic. Oropharynx and nasopharynx clear. Dry oral mucosa NECK:  Supple, no jugular venous distention. No thyroid enlargement, no  tenderness.  LUNGS: Normal breath sounds bilaterally, no wheezing, rales, rhonchi. No use of accessory muscles of respiration.  CARDIOVASCULAR: S1, S2 normal. No murmurs, rubs, or gallops.  ABDOMEN: Soft, nontender, nondistended. Bowel sounds present. No organomegaly or mass. PEG tube EXTREMITIES: No cyanosis, clubbing or edema b/l.    NEUROLOGIC: Cranial nerves II through XII are intact. No focal Motor or sensory deficits b/l.   PSYCHIATRIC: The patient is alert and oriented x 3.  SKIN: No obvious rash, lesion, or ulcer.    LABORATORY PANEL:   CBC  Recent Labs Lab 08/07/15 0411  WBC 5.3  HGB 9.8*  HCT 29.6*  PLT 251   ------------------------------------------------------------------------------------------------------------------  Chemistries   Recent Labs Lab 08/03/15 1130  08/05/15 0412 08/08/15 0423  NA 130*  < >  --  132*  K 3.4*  < >  --  4.0  CL 94*  < >  --  102  CO2 27  < >  --  19*  GLUCOSE 89  < >  --  68  BUN 21*  < >  --  <5*  CREATININE 0.67  < >  --  0.60*  CALCIUM 8.4*  < >  --  8.3*  MG  --   --  1.7  --   AST 21  --   --   --   ALT 12*  --   --   --   ALKPHOS 76  --   --   --   BILITOT 1.0  --   --   --   < > = values in this interval not displayed. ------------------------------------------------------------------------------------------------------------------  Cardiac Enzymes No results for input(s): TROPONINI in the last 168 hours. ------------------------------------------------------------------------------------------------------------------  RADIOLOGY:  No results found.  ASSESSMENT AND PLAN:   1. Gastric and duodenal ulcers with esophagitis on EGD On PPI, Carafate, Reglan and zofran Will d/c home tomorrow if able to tolerate TFs.  2. Hypokalemia- replace potassium in IV fluids  3. Essential hypertension continue lisinopril. Elevated as he is not tolerating pills IV PRN meds.  4. Hyperlipidemia unspecified continue  pravastatin  5. Depression on Celexa  6. Chronic pain on oxycodone art home. Morphine IV here as he is NPO  7. Dehydration On IVF  8. Pseudomonas UTI Will start ceftazidime and treat while in hospital.   All the records are reviewed and case discussed with Care Management/Social Workerr. Management plans discussed with the patient, family and they are in agreement.  CODE STATUS: FULL  DVT Prophylaxis: SCDs  TOTAL TIME TAKING CARE OF THIS PATIENT: 35 minutes.   POSSIBLE D/C IN 1-2  DAYS, DEPENDING ON CLINICAL CONDITION.   Hillary Bow R M.D on 08/08/2015 at 4:51 PM  Between 7am to 6pm - Pager - [phone]  After 6pm go to [email or web address] - password [email or web address] [facility]  Office  [phone]  CC: Primary care physician; Cyndi Bender, [name]

## 2015-08-08 NOTE — Plan of Care (Signed)
Problem: Discharge Progression Outcomes Goal: Discharge plan in place and appropriate Individualization of care:  Prefers to be called Grant Lee 2nd chemo was given on Monday and pt has had vomiting and nausea since cant keep any tube feeding down, history of chronic pain, HTN, controlled by home meds HIGH fall precautions, refused bed alarm  Goal: Other Discharge Outcomes/Goals Pt was able to tolerate some po foods and fluids today and 1 can of jevity at this time with water flush, pt having some nausea this evening with no s/s of vomiting, pain controlled at intervals with morphine and roxicodone, family into visit.

## 2015-08-08 NOTE — Progress Notes (Signed)
1330 pt refused to go to xrt at the cancer center, Radiation notifed.  Donnita [place], RN
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
## 2015-08-08 NOTE — Care Management Important Message (Signed)
Important Message  Patient Details  Name: Grant Lee MRN: [id number] Date of Birth: 1956-04-14   Medicare Important Message Given:  Yes-third notification given    [name] 08/08/2015, 3:39 PM

## 2015-08-08 NOTE — Consult Note (Signed)
Subjective: Patient seen for  Nausea and vomiting, PEG problem.  Doing better today, tolerating soft diet items and tf without n/v.   Objective: Vital signs in last 24 hours: Temp:  [97.9 F (36.6 C)-98.7 F (37.1 C)] 98.7 F (37.1 C) (09/28 2051) Pulse Rate:  [54-92] 92 (09/28 2051) Resp:  [18] 18 (09/28 0523) BP: (106-118)/(60-78) 116/69 mmHg (09/28 2051) SpO2:  [93 %-97 %] 95 % (09/28 2051) Blood pressure 116/69, pulse 92, temperature 98.7 F (37.1 C), temperature source Oral, resp. rate 18, height '6\' 2"'$  (1.88 m), weight 60.782 kg (134 lb), SpO2 95 %.   Intake/Output from previous day: 09/27 0701 - 09/28 0700 In: 300 [I.V.:300] Out: -   Intake/Output this shift:     General appearance:  Thin, no distress Resp:  bcta Cardio:  rrr  GI:  soft, nontender non distended, bowel sounds positive Extremities:  No cce   Lab Results: Results for orders placed or performed during the hospital encounter of 08/03/15 (from the past 24 hour(s))  Basic metabolic panel     Status: Abnormal   Collection Time: 08/08/15  4:23 AM  Result Value Ref Range   Sodium 132 (L) 135 - 145 mmol/L   Potassium 4.0 3.5 - 5.1 mmol/L   Chloride 102 101 - 111 mmol/L   CO2 19 (L) 22 - 32 mmol/L   Glucose, Bld 68 65 - 99 mg/dL   BUN <5 (L) 6 - 20 mg/dL   Creatinine, Ser 0.60 (L) 0.61 - 1.24 mg/dL   Calcium 8.3 (L) 8.9 - 10.3 mg/dL   GFR calc non Af Amer >60 >60 mL/min   GFR calc Af Amer >60 >60 mL/min   Anion gap 11 5 - 15      Recent Labs  08/07/15 0411  WBC 5.3  HGB 9.8*  HCT 29.6*  PLT 251   BMET  Recent Labs  08/08/15 0423  NA 132*  K 4.0  CL 102  CO2 19*  GLUCOSE 68  BUN <5*  CREATININE 0.60*  CALCIUM 8.3*   LFT No results for input(s): PROT, ALBUMIN, AST, ALT, ALKPHOS, BILITOT, BILIDIR, IBILI in the last 72 hours. PT/INR No results for input(s): LABPROT, INR in the last 72 hours. Hepatitis Panel No results for input(s): HEPBSAG, HCVAB, HEPAIGM, HEPBIGM in the last 72  hours. C-Diff No results for input(s): CDIFFTOX in the last 72 hours. No results for input(s): CDIFFPCR in the last 72 hours.   Studies/Results: No results found.  Scheduled Inpatient Medications:   . antiseptic oral rinse  7 mL Mouth Rinse q12n4p  . cefTAZidime (FORTAZ)  IV  500 mg Intravenous 3 times per day  . citalopram  20 mg Per Tube Daily  . enoxaparin (LOVENOX) injection  40 mg Subcutaneous Q24H  . feeding supplement (JEVITY 1.5 CAL/FIBER)  237 mL Per Tube Q4H  . free water  200 mL Per Tube Q6H  . Influenza vac split quadrivalent PF  0.5 mL Intramuscular Tomorrow-1000  . lisinopril  10 mg Per Tube Daily  . metoCLOPramide (REGLAN) injection  10 mg Intravenous 4 times per day  . ondansetron (ZOFRAN) IV  4 mg Intravenous 4 times per day  . pantoprazole (PROTONIX) IV  40 mg Intravenous Q12H  . pravastatin  40 mg Per Tube q1800  . sucralfate  1 g Per Tube TID WC & HS  . traZODone  100 mg Per Tube QHS    Continuous Inpatient Infusions:     PRN Inpatient Medications:  hydrALAZINE,  labetalol, morphine injection, oxyCODONE, promethazine  Miscellaneous:   Assessment:  1 nausea and vomiting, secondary to peptic ulcer disease and peg migration.  Peg in a stable adjustment, I have discussed with patient the proper markings.  Patient on bid iv ppi and carafate. H pylori pending  Plan:  Change ppi to po, continue carafate.  Will need GI fu in 2-3 weeks after d/c.    Lollie Sails MD 08/08/2015, 9:33 PM

## 2015-08-08 NOTE — Progress Notes (Signed)
Nutrition Follow-up  DOCUMENTATION CODES:   Severe malnutrition in context of acute illness/injury (likely chronic as well due to cancer)  INTERVENTION:   Meals and Snacks: Cater to patient preferences; pt now on regular diet. Of note, prior to admission pt reporting he had not tried any "solid" foods yet like mashed potatoes, scrambled eggs, etc. Pt had only been taking in liquid items like "muscle milk" shakes, ice cream. Majority of pt's nutrition coming from TF prior to admission, recommend that this continue at discharge EN: pt agreeable to trying bolus feeding today; orders already active in computer. Ideally, pt would continue bolus feedings at home. However, If pt does not tolerate feedings, may benefit from switching formula and/or changing modality of feeding to an intermittent feeding or night time feeding. Continue to assess Medical Food Supplement Therapy: if tolerates po intake, will add Carnation Instant Breakfast shakes on meal trays (as pt drinking Muscle Milk shakes prior to admission)   NUTRITION DIAGNOSIS:   Inadequate enteral nutrition infusion related to nausea, vomiting, cancer and cancer related treatments as evidenced by  (inability to tolerate TF or oral nutrition for 5 days with wt loss).  GOAL:   Patient will meet greater than or equal to 90% of their needs   MONITOR:    (Energy Intake, Anthropometrics, Digestive System, Electrolyte/Renal Profile)  ASSESSMENT:    EGD showing reflux and erosive esophagitis, gastric and duodenal ulcers,  duodenitis, no active bleeding  Diet Order:  Diet regular Room service appropriate?: Yes; Fluid consistency:: Thin Diet - low sodium heart healthy   Energy Intake: pt had just received breakfast tray this AM, taking sips of coffee. Pt reports his plan today is to wait until his wife arrives and then he plans to try a bolus feeding. His personal goal is to try 2 feedings today.   Digestive System: on visit this AM, pt  reports nausea well managed, no vomiting although pt spitting into emesis bag  Electrolyte and Renal Profile:  Recent Labs Lab 08/03/15 1130 08/04/15 0533 08/05/15 0412 08/08/15 0423  BUN 21* 15  --  <5*  CREATININE 0.67 0.57*  --  0.60*  NA 130* 135  --  132*  K 3.4* 3.7  --  4.0  MG  --   --  1.7  --   PHOS  --   --  2.6  --    Meds: reglan, zofran, protonix, carafate, phenergan  Last BM:  9/27  Height:   Ht Readings from Last 1 Encounters:  08/03/15 '6\' 2"'$  (1.88 m)    Weight:   Wt Readings from Last 1 Encounters:  08/03/15 134 lb (60.782 kg)   BMI:  Body mass index is 17.2 kg/(m^2).  Estimated Nutritional Needs:   Kcal:  3354-5625 kcals (BEE 1649, 1.3 AF, 1.1-1.3 IF)  using IBW 77 kg  Protein:  85-108 h (1.1-1.4 g/kg)   Fluid:  2310-2695 mL (30-35 ml/kg)   HIGH Care Level  Kerman Passey MS, RD, LDN [phone] Pager

## 2015-08-08 NOTE — Discharge Instructions (Addendum)
DIET:  Cardiac diet. Continue TUBE FEEDS.  DISCHARGE CONDITION:  Stable  ACTIVITY:  Activity as tolerated  OXYGEN:  Home Oxygen: No.   Oxygen Delivery: room air  DISCHARGE LOCATION:  home   If you experience worsening of your admission symptoms, develop shortness of breath, life threatening emergency, suicidal or homicidal thoughts you must seek medical attention immediately by calling 911 or calling your MD immediately  if symptoms less severe.  You Must read complete instructions/literature along with all the possible adverse reactions/side effects for all the Medicines you take and that have been prescribed to you. Take any new Medicines after you have completely understood and accpet all the possible adverse reactions/side effects.   Please note  You were cared for by a hospitalist during your hospital stay. If you have any questions about your discharge medications or the care you received while you were in the hospital after you are discharged, you can call the unit and asked to speak with the hospitalist on call if the hospitalist that took care of you is not available. Once you are discharged, your primary care physician will handle any further medical issues. Please note that NO REFILLS for any discharge medications will be authorized once you are discharged, as it is imperative that you return to your primary care physician (or establish a relationship with a primary care physician if you do not have one) for your aftercare needs so that they can reassess your need for medications and monitor your lab values.    Care of a Feeding Tube Feeding tubes are often given to those who have trouble swallowing or cannot take food or medicine. A feeding tube can:   Go into the nose and down to the stomach.  Go through the skin in the belly (abdomen) and into the stomach or small bowel. SUPPLIES NEEDED TO CARE FOR THE TUBE SITE  Clean gloves.  Clean wash cloth, gauze pads, or  soft paper towel.  Cotton swabs.  Skin barrier ointment or cream.  Soap and water.  Precut foam pads or gauze (that go around the tube).  Tube tape. TUBE SITE CARE  Have all supplies ready.  Wash hands well.   Put on clean gloves.  Remove dirty foam pads or gauze near the tube site, if present.  Check the skin around the tube site for redness, rash, puffiness (swelling), leaking fluid, or extra tissue growth. Call your doctor if you see any of these.  Wet the gauze and cotton swabs with water and soap.  Wipe the area closest to the tube with cotton swabs. Wipe the surrounding skin with moistened gauze. Rinse with water.  Dry the skin and tube site with a dry gauze pad or soft paper towel. Do not use antibiotic ointments at the tube site.  If the skin is red, apply petroleum jelly in a circular motion, using a cotton swab. Your doctor may suggest a different cream or ointment. Use what the doctor suggests.  Apply a new pre-cut foam pad or gauze around the tube. Tape the edges down. Foam pads or gauze may be left off if there is no fluid at the tube site.  Use tape or a device that will attach your feeding tube to your skin or do as directed. Rotate where you tape the tube.  Sit the person up.  Throw away used supplies.  Remove gloves.  Wash hands. SUPPLIES NEEDED TO FLUSH A FEEDING TUBE  Clean gloves.  60 mL syringe (that  connects to feeding tube).  Towel.  Water. FLUSHING A FEEDING TUBE   Have all supplies ready.  Wash hands well.  Put on clean gloves.  Pull 30 mL of water into the syringe.  Bend (kink) the feeding tube while disconnecting it from the feeding-bag tubing or while removing the plug at the end of the tube.  Insert the tip of the syringe into the end of the feeding tube. Stop bending the tube. Slowly inject the water.  If you cannot inject the water, the person with the feeding tube should lay on their left side.  After injecting the  water, remove the syringe.  Always flush the tube before giving the first medicine, between medicines, and after the final medicine before starting a feeding.  Throw away used supplies.  Remove gloves.  Wash hands. Document Released: 07/21/2012 Document Reviewed: 07/21/2012 [facility] Patient Information 2015 [place]. This information is not intended to replace advice given to you by your health care provider. Make sure you discuss any questions you have with your health care provider.  Chemotherapy Many people are apprehensive about chemotherapy due to concerns over uncomfortable side effects. However, managements for side effects have come a long way. Many side effects once associated with chemotherapy can be prevented and/or controlled. WHAT IS CHEMOTHERAPY? Chemotherapy is the general term for any treatment involving the use of chemical agents. Chemotherapy can be given through a vein, most commonly through an implanted port* or PICC line.* It can also be delivered by mouth (orally) in the form of a pill. The main goal of chemotherapy is to kill cancer cells and stop them from growing. It can destroy and eliminate cancer cells where the cancer started (primary tumor location) and throughout the body, often far away from the original cancer. It is a treatment that not only targets the original cancer location, but also the entire body (systemic treatment) for full effect and results. Chemotherapy works by destroying cancer cells. Unfortunately, it cannot tell the difference between a cancer cell and some healthy cells. This results in the death of noncancerous cells, such as hair and blood cells. Harm to healthy cells is what causes side effects. These cells usually repair themselves after chemotherapy. Because some drugs work better together rather than alone, 2 or more drugs are often given at the same time. This is called combination chemotherapy. Depending on the type of cancer and how  advanced it is, chemotherapy can be used for different goals:  Cure the cancer.  Keep the cancer from spreading.  Slow the cancer's growth.  Kill cancer cells that may have spread to other parts of the body from the original tumor.  Relieve symptoms caused by cancer. You and your caregiver will decide what drug or combination of drugs you will get. Your caregiver will choose the doses, how the drugs will be given, how often, and how long you will get treatment. All of these decisions will depend on the type of cancer, where it is, how big it is, and how it is affecting your normal body functions and overall health. *Implanted port - A device that is implanted under your skin so that medicines may be delivered directly into your blood system. *PICC line (peripherally inserted central catheter) - A long, slender, flexible tube. This tube is often inserted into a vein, typically in the upper arm. The tip stops in the large central vein that leads to your heart. Document Released: 08/24/2007 Document Revised: 01/19/2012 Document Reviewed: 02/08/2009 [employer]  Patient Information 2015 [place]. This information is not intended to replace advice given to you by your health care provider. Make sure you discuss any questions you have with your health care provider.  Nausea and Vomiting Nausea is a sick feeling that often comes before throwing up (vomiting). Vomiting is a reflex where stomach contents come out of your mouth. Vomiting can cause severe loss of body fluids (dehydration). Children and elderly adults can become dehydrated quickly, especially if they also have diarrhea. Nausea and vomiting are symptoms of a condition or disease. It is important to find the cause of your symptoms. CAUSES   Direct irritation of the stomach lining. This irritation can result from increased acid production (gastroesophageal reflux disease), infection, food poisoning, taking certain medicines (such as  nonsteroidal anti-inflammatory drugs), alcohol use, or tobacco use.  Signals from the brain.These signals could be caused by a headache, heat exposure, an inner ear disturbance, increased pressure in the brain from injury, infection, a tumor, or a concussion, pain, emotional stimulus, or metabolic problems.  An obstruction in the gastrointestinal tract (bowel obstruction).  Illnesses such as diabetes, hepatitis, gallbladder problems, appendicitis, kidney problems, cancer, sepsis, atypical symptoms of a heart attack, or eating disorders.  Medical treatments such as chemotherapy and radiation.  Receiving medicine that makes you sleep (general anesthetic) during surgery. DIAGNOSIS Your caregiver may ask for tests to be done if the problems do not improve after a few days. Tests may also be done if symptoms are severe or if the reason for the nausea and vomiting is not clear. Tests may include:  Urine tests.  Blood tests.  Stool tests.  Cultures (to look for evidence of infection).  X-rays or other imaging studies. Test results can help your caregiver make decisions about treatment or the need for additional tests. TREATMENT You need to stay well hydrated. Drink frequently but in small amounts.You may wish to drink water, sports drinks, clear broth, or eat frozen ice pops or gelatin dessert to help stay hydrated.When you eat, eating slowly may help prevent nausea.There are also some antinausea medicines that may help prevent nausea. HOME CARE INSTRUCTIONS   Take all medicine as directed by your caregiver.  If you do not have an appetite, do not force yourself to eat. However, you must continue to drink fluids.  If you have an appetite, eat a normal diet unless your caregiver tells you differently.  Eat a variety of complex carbohydrates (rice, wheat, potatoes, bread), lean meats, yogurt, fruits, and vegetables.  Avoid high-fat foods because they are more difficult to  digest.  Drink enough water and fluids to keep your urine clear or pale yellow.  If you are dehydrated, ask your caregiver for specific rehydration instructions. Signs of dehydration may include:  Severe thirst.  Dry lips and mouth.  Dizziness.  Dark urine.  Decreasing urine frequency and amount.  Confusion.  Rapid breathing or pulse. SEEK IMMEDIATE MEDICAL CARE IF:   You have blood or brown flecks (like coffee grounds) in your vomit.  You have black or bloody stools.  You have a severe headache or stiff neck.  You are confused.  You have severe abdominal pain.  You have chest pain or trouble breathing.  You do not urinate at least once every 8 hours.  You develop cold or clammy skin.  You continue to vomit for longer than 24 to 48 hours.  You have a fever. MAKE SURE YOU:   Understand these instructions.  Will watch your  condition.  Will get help right away if you are not doing well or get worse. Document Released: 10/27/2005 Document Revised: 01/19/2012 Document Reviewed: 03/26/2011 [facility] Patient Information 2015 [place], [place]. This information is not intended to replace advice given to you by your health care provider. Make sure you discuss any questions you have with your health care provider.  PEG, Home Care You had a percutaneous endoscopic gastrostomy (PEG) tube put in your stomach. Do not put anything in your feeding tube until you have been shown how to use it. HOME CARE For the first 24 hours:  Do not sign any important or legal papers.  Do not drive. Every day:  Check the place where the tube goes into your belly. See your doctor if the tube site is red, puffy (swollen), or smells bad.  Clean around the tube with soap and water. Dry the area by patting it gently with a clean towel.  Do not put a bandage around the tube site. Keep the area dry.  You should turn the bumper on the outside of your belly. The bumper should lie flat against  your skin so you are comfortable.  Do not put whole pills through the tube. Instead, crush and dissolve all pills in warm water before placing down tube. GET HELP RIGHT AWAY IF:  You throw up (vomit).  The pain in your belly gets worse.  You have trouble breathing.  You have a temperature by mouth above 102 F (38.9 C), not controlled by medicine.  You have pain when you swallow.  You have chest pain.  There is green or yellow fluid (drainage) around the tube site that smells bad.  There is redness and puffiness around the tube site.  The tube comes out. MAKE SURE YOU:  Understand these instructions.  Will watch your condition.  Will get help right away if you are not doing well or get worse. Document Released: 11/29/2010 Document Revised: 06/29/2013 Document Reviewed: 04/28/2013 [facility] Patient Information 2015 [place], [place]. This information is not intended to replace advice given to you by your health care provider. Make sure you discuss any questions you have with your health care provider.

## 2015-08-09 ENCOUNTER — Encounter: Payer: Self-pay | Admitting: Gastroenterology

## 2015-08-09 ENCOUNTER — Ambulatory Visit: Payer: Medicare Other

## 2015-08-09 LAB — H PYLORI, IGM, IGG, IGA AB
H Pylori IgG: <0.9 U/mL (ref 0.0–0.8)
H. Pylogi, Iga Abs: <9 units (ref 0.0–8.9)
H. Pylogi, Igm Abs: <9 units (ref 0.0–8.9)

## 2015-08-09 MED ORDER — FENTANYL 25 MCG/HR TD PT72: 50.0000 ug | MEDICATED_PATCH | TRANSDERMAL | Status: DC

## 2015-08-09 MED ORDER — PANTOPRAZOLE SODIUM 40 MG PO TBEC
40.0000 mg | DELAYED_RELEASE_TABLET | Freq: Two times a day (BID) | ORAL | Status: DC
  Administered 2015-08-09: 09:00:00 40 mg via ORAL
  Filled 2015-08-09: qty 1

## 2015-08-09 MED ORDER — SODIUM CHLORIDE 0.9 % IJ SOLN: 10.0000 mL | INTRAMUSCULAR | Status: DC | PRN

## 2015-08-09 MED ORDER — SUCRALFATE 1 GM/10ML PO SUSP: 1.0000 g | Freq: Three times a day (TID) | ORAL | Status: DC

## 2015-08-09 MED ORDER — HEPARIN SOD (PORK) LOCK FLUSH 100 UNIT/ML IV SOLN
500.0000 [IU] | INTRAVENOUS | Status: AC | PRN
  Administered 2015-08-09: 500 [IU]
  Filled 2015-08-09: qty 5

## 2015-08-09 MED ORDER — PANTOPRAZOLE SODIUM 40 MG PO PACK: 40.0000 mg | PACK | Freq: Two times a day (BID) | ORAL | Status: DC

## 2015-08-09 NOTE — Progress Notes (Signed)
[place] @ [facility] [phone]  Fax:([phone]) [place] OB: Mar 16, 1956  MR#: [id number]  SWF#:[id number]  Patient Care Team: Cyndi Bender, PA-C as PCP - General (Physician Assistant) Beverly Gust, MD (Unknown Physician Specialty)  CHIEF COMPLAINT:  Chief Complaint  Patient presents with  . Fatigue   1.  Has a history of cancer of the tongue in 2006.  Patient underwent resection followed by radiation therapy 2.  Increasing difficulty in swallowing for last 2 or 3 weeks.  Patient had upper endoscopy done in December of 2015. 3.  Significant weight loss 4.  Abnormal PET scan (August of 2016) 5.  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  (Cis-platinum and radiation therapy) (September 2 016) T3 N0 M0 tumor Status post PEG tube placement (September, 2016)  VISIT DIAGNOSIS:  Significant weight loss. Difficulty swallowing. History of carcinoma of tongue    Oncology Flowsheet 08/07/2015 08/07/2015 08/08/2015 08/08/2015 08/08/2015 08/09/2015 08/09/2015  Day, Cycle - - - - - - -  CISplatin (PLATINOL) IV - - - - - - -  dexamethasone (DECADRON) IJ - - - - - - -  dexamethasone (DECADRON) IV - - - - - - -  enoxaparin (LOVENOX) [place]     40 mg     - -  fosaprepitant (EMEND) IV - - - - - - -  granisetron (KYTRIL) PO - - - - - - -  LORazepam (ATIVAN) IV - - - - - - -  metoCLOPramide (REGLAN) IV 10 mg 10 mg 10 mg 10 mg 10 mg 10 mg 10 mg  ondansetron (ZOFRAN) IV 4 mg 4 mg 4 mg 4 mg 4 mg 4 mg 4 mg  palonosetron (ALOXI) IV - - - - - - -  promethazine (PHENERGAN) IM - - - - - - -  promethazine (PHENERGAN) IV     25 mg     - -    INTERVAL HISTORY:  Nausea has been improving.  Patient will continue Protonix and Carafate.  Patient tolerated diet. Patient is expected to go home in next day or 2  .  And Carafate.Marland Kitchen REVIEW OF SYSTEMS:   Declining performance status.   Significant weight loss.  Persistent nausea. Getting radiation therapy  PAST MEDICAL HISTORY: Carcinoma of tongue Hypertension Hypercholesterolemia Previous substance abuse Gastroesophageal reflux disease  PAST SURGICAL HISTORY: Treated for carcinoma of tongue FAMILY HISTORY There is no significant family history of breast cancer, ovarian cancer, colon cancer    ADVANCED DIRECTIVES:  Patient does not have any living will or healthcare power of attorney.  Information was given .  Available resources had been discussed.  We will follow-up on subsequent appointments regarding this issue  HEALTH MAINTENANCE: Social History  Substance Use Topics  . Smoking status: Former Smoker -- 1.00 packs/day for 0 years    Types: Cigarettes    Quit date: 09/21/2014  . Smokeless tobacco: None  . Alcohol Use: 7.2 oz/week    12 Cans of beer per week     Comment: beer/wine every day    Quit smoking in November of 2015.  History of smoking for several years in the past.  No Known Allergies  Current Facility-Administered Medications  Medication Dose Route Frequency Provider Last Rate Last Dose  . antiseptic oral rinse (CPC / CETYLPYRIDINIUM CHLORIDE 0.05%) solution 7 mL  7 mL Mouth Rinse q12n4p Hillary Bow, MD   7 mL  at 08/07/15 1154  . cefTAZidime (FORTAZ) 500 mg in dextrose 5 % 50 mL IVPB  500 mg Intravenous 3 times per day Hillary Bow, MD   500 mg at 08/09/15 0559  . citalopram (CELEXA) tablet 20 mg  20 mg Per Tube Daily Loletha Grayer, MD   20 mg at 08/09/15 0859  . enoxaparin (LOVENOX) injection 40 mg  40 mg Subcutaneous Q24H Loletha Grayer, MD   40 mg at 08/08/15 1529  . feeding supplement (JEVITY 1.5 CAL/FIBER) liquid 237 mL  237 mL Per Tube Q4H Lollie Sails, MD   237 mL at 08/09/15 0557  . free water 200 mL  200 mL Per Tube Q6H Srikar Sudini, MD   200 mL at 08/09/15 0340  . hydrALAZINE (APRESOLINE) injection 10 mg  10 mg Intravenous Q6H PRN Srikar Sudini, MD      . Influenza  vac split quadrivalent PF (FLUARIX) injection 0.5 mL  0.5 mL Intramuscular Tomorrow-1000 Loletha Grayer, MD   Stopped at 08/07/15 9323  . labetalol (NORMODYNE,TRANDATE) injection 10 mg  10 mg Intravenous Q6H PRN Hillary Bow, MD   5 mg at 08/07/15 1809  . lisinopril (PRINIVIL,ZESTRIL) tablet 10 mg  10 mg Per Tube Daily Loletha Grayer, MD   10 mg at 08/09/15 0859  . metoCLOPramide (REGLAN) injection 10 mg  10 mg Intravenous 4 times per day Lloyd Huger, MD   10 mg at 08/09/15 0558  . morphine 2 MG/ML injection 2 mg  2 mg Intravenous Q2H PRN Harrie Foreman, MD   2 mg at 08/09/15 0902  . ondansetron (ZOFRAN) injection 4 mg  4 mg Intravenous 4 times per day Loletha Grayer, MD   4 mg at 08/09/15 0558  . oxyCODONE (Oxy IR/ROXICODONE) immediate release tablet 30 mg  30 mg Per Tube Q4H PRN Loletha Grayer, MD   30 mg at 08/09/15 0717  . pantoprazole (PROTONIX) EC tablet 40 mg  40 mg Oral BID Hillary Bow, MD   40 mg at 08/09/15 0900  . pravastatin (PRAVACHOL) tablet 40 mg  40 mg Per Tube q1800 Loletha Grayer, MD   40 mg at 08/08/15 1721  . promethazine (PHENERGAN) injection 25 mg  25 mg Intravenous Q8H PRN Loletha Grayer, MD   25 mg at 08/08/15 1529  . sucralfate (CARAFATE) 1 GM/10ML suspension 1 g  1 g Per Tube TID WC & HS Lollie Sails, MD   1 g at 08/09/15 0859  . traZODone (DESYREL) tablet 100 mg  100 mg Per Tube QHS Loletha Grayer, MD   100 mg at 08/08/15 2140    OBJECTIVE: PHYSICAL EXAM: Gen. status: Patient is seen lean and cachectic. HEENT: No soreness in the mouth.  But swelling which is diffuse Lymphatic system: Supraclavicular, cervical, axillary, inguinal lymph nodes are not palpable Lungs: Bilateral rhonchi and occasional crepitation. Cardiac: Tachycardia Examination of the skin revealed no evidence of significant rashes, suspicious appearing nevi or other concerning lesions.. Abdominal exam revealed normal bowel sounds. The abdomen was soft, non-tender, and without  masses, organomegaly, or appreciable enlargement of the abdominal aorta..   Peg  placement Psychiatric system: Depression and anxiety  Filed Vitals:   08/09/15 0810  BP: 105/69  Pulse: 97  Temp: 98.5 F (36.9 C)  Resp: 18     Body mass index is 17.2 kg/(m^2).    ECOG FS:1 - Symptomatic but completely ambulatory  LAB RESULTS:   Chemistry 2 days ago was within normal limit anemia   ASSESSMENT:  A.  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  Carcinoma of tongue in 2006 status post resection and radiation therapy exit staging not known and old records being off pain for review 2.  Difficulty in swallowing 2-3 weeks duration.  Had a normal upper endoscopy in December of 2015 with esophageal dilated Patient history of peptic ulcer disease 3, significant weight loss 4, previous history of chronic drug abuse.  Chronic tobacco abuse.    PLAN:   Gastritis is improving.  Patient started tolerating food. If patient goes home we will see this patient next week and proceed if needed with IV Protonix on a regular basis as outpatient and may consider starting radiation chemotherapy within next 7 days.  I will follow-up this patient as outpatient.  The patient goes over the weekend patient would be called to see me on Monday or Tuesday in my office My office staff was informed regarding making appointment they will call him on Monday     No matching staging information was found for the patient.  Forest Gleason, MD   08/09/2015 10:01 AM

## 2015-08-09 NOTE — Progress Notes (Signed)
Pt and girlfriend given d/c instructions r/t gtube care, activity, diet, followup care and medications, prescriptions x 6 given to pt, pt d/c home via wheelchair escorted by auxillary

## 2015-08-09 NOTE — Progress Notes (Signed)
Key Points: Use following P&T approved IV to PO antibiotic change policy.  Description contains the criteria that are approved Note: Policy Excludes:  Esophagectomy patientsPHARMACIST - PHYSICIAN COMMUNICATION CONCERNING: IV to Oral Route Change Policy  RECOMMENDATION: This patient is receiving pantoprazole by the intravenous route.  Based on criteria approved by the Pharmacy and Therapeutics Committee, the intravenous medication(s) is/are being converted to the equivalent oral dose form(s).   DESCRIPTION: These criteria include:  The patient is eating (either orally or via tube) and/or has been taking other orally administered medications for a least 24 hours  The patient has no evidence of active gastrointestinal bleeding or impaired GI absorption (gastrectomy, short bowel, patient on TNA or NPO).  If you have questions about this conversion, please contact the Pharmacy Department  '[]'$   [phone] )  [name] '[]'$   [phone] )  [name]  '[]'$   [phone] )  [facility] '[]'$   [phone] )  [place], [facility] 08/09/2015 8:50 AM

## 2015-08-09 NOTE — Anesthesia Postprocedure Evaluation (Signed)
  Anesthesia Post-op Note  Patient: Grant Lee  Procedure(s) Performed: Procedure(s): ESOPHAGOGASTRODUODENOSCOPY (EGD) WITH PROPOFOL (N/A)  Anesthesia type:General  Patient location: PACU  Post pain: Pain level controlled  Post assessment: Post-op Vital signs reviewed, Patient's Cardiovascular Status Stable, Respiratory Function Stable, Patent Airway and No signs of Nausea or vomiting  Post vital signs: Reviewed and stable  Last Vitals:  Filed Vitals:   08/09/15 0810  BP: 105/69  Pulse: 97  Temp: 36.9 C  Resp: 18    Level of consciousness: awake, alert  and patient cooperative  Complications: No apparent anesthesia complications

## 2015-08-10 ENCOUNTER — Ambulatory Visit: Payer: Medicare Other

## 2015-08-10 ENCOUNTER — Other Ambulatory Visit: Payer: Self-pay | Admitting: *Deleted

## 2015-08-10 NOTE — Discharge Summary (Signed)
[place] at [place] NAME: Grant Lee    MR#:  [id number]  DATE OF BIRTH:  25-Sep-1956  DATE OF ADMISSION:  08/03/2015 ADMITTING PHYSICIAN: Loletha Grayer, MD  DATE OF DISCHARGE: 08/09/2015  1:15 PM  PRIMARY CARE PHYSICIAN: Cyndi Bender, PA-C    ADMISSION DIAGNOSIS:  General weakness [R53.1] Refractory nausea and vomiting [R11.2]  DISCHARGE DIAGNOSIS:  Active Problems:   Chemotherapy induced nausea and vomiting   PUD (peptic ulcer disease)   Acute esophagitis   Chronic pain syndrome   SECONDARY DIAGNOSIS:   Past Medical History  Diagnosis Date  . Hypertension   . Pneumonia     2004  . GERD (gastroesophageal reflux disease)   . Arthritis   . Anemia     blood transfusion 2012  . Bleeding ulcer   . Machinery accident     LEG INJURY  . Cancer     mouth 2006 then again 2016     ADMITTING HISTORY  Grant Lee is a 59 y.o. 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 He had chemotherapy on Monday and has been having nausea and vomiting ever since. He is unable to keep down his tube feeding. He's lost 6-7 pounds since. No blood in the vomit. He has been feeling really weak. He's been hurting all over in his arms chest stomach. He did have an episode of diarrhea the other day. Hospitalist services were contacted for further evaluation.   HOSPITAL COURSE:   1. Gastric and duodenal ulcers with esophagitis on EGD On PPI, Carafate, Reglan and zofran Tolerating tube feeds. Discussed with Dr. Oliva Bustard who will see pt in office in 3 days  2. Hypokalemia- replace potassium in IV fluids  3. Essential hypertension continue lisinopril. Elevated as he is not tolerating pills IV PRN meds.  4. Hyperlipidemia unspecified continue pravastatin  5. Depression on Celexa  6. Chronic pain on oxycodone art home. Morphine IV here as he is NPO  7. Dehydration On IVF  8.  Pseudomonas UTI 3 days ceftazidime given in hospital    CONSULTS OBTAINED:  Treatment Team:  Forest Gleason, MD Lollie Sails, MD  DRUG ALLERGIES:  No Known Allergies  DISCHARGE MEDICATIONS:   Discharge Medication List as of 08/09/2015 11:16 AM    START taking these medications   Details  pantoprazole sodium (PROTONIX) 40 mg/20 mL PACK Place 20 mLs (40 mg total) into feeding tube 2 (two) times daily., Starting 08/09/2015, Until Discontinued, Print    promethazine (PHENERGAN) 25 MG suppository Place 1 suppository (25 mg total) rectally every 8 (eight) hours as needed for nausea or vomiting., Starting 08/08/2015, Until Discontinued, Print    sucralfate (CARAFATE) 1 GM/10ML suspension Take 10 mLs (1 g total) by mouth 4 (four) times daily -  with meals and at bedtime., Starting 08/09/2015, Until Discontinued, Print      CONTINUE these medications which have CHANGED   Details  fentaNYL (DURAGESIC - DOSED MCG/HR) 25 MCG/HR patch Place 2 patches (50 mcg total) onto the skin every 3 (three) days., Starting 08/09/2015, Until Discontinued, Print    ondansetron (ZOFRAN) 4 MG tablet Take 1 tablet (4 mg total) by mouth every 4 (four) hours as needed for nausea or vomiting., Starting 08/08/2015, Until Discontinued, Print      CONTINUE these medications which have NOT CHANGED   Details  acidophilus (RISAQUAD) CAPS capsule Take 1 capsule by mouth daily., Until Discontinued, Historical Med  chlorhexidine (PERIDEX) 0.12 % solution 15 mLs by Mouth Rinse route 2 (two) times daily., Starting 07/07/2015, Until Discontinued, Normal    citalopram (CELEXA) 20 MG tablet Take 1 tablet (20 mg total) by mouth daily., Starting 07/26/2015, Until Discontinued, Normal    lidocaine-prilocaine (EMLA) cream Apply 1 application topically as needed., Starting 08/01/2015, Until Discontinued, Normal    lisinopril (PRINIVIL,ZESTRIL) 10 MG tablet Take 10 mg by mouth daily., Until Discontinued, Historical Med     oxycodone (ROXICODONE) 30 MG immediate release tablet Take 30 mg by mouth 5 (five) times daily., Until Discontinued, Historical Med    phenol (CHLORASEPTIC) 1.4 % LIQD Use as directed 1 spray in the mouth or throat as needed for throat irritation / pain., Starting 07/07/2015, Until Discontinued, Normal    pravastatin (PRAVACHOL) 40 MG tablet Take 40 mg by mouth daily., Until Discontinued, Historical Med    traZODone (DESYREL) 100 MG tablet Take 100 mg by mouth at bedtime., Until Discontinued, Historical Med      STOP taking these medications     omeprazole (PRILOSEC) 40 MG capsule      oxyCODONE-acetaminophen (PERCOCET) 7.5-325 MG per tablet          Today    VITAL SIGNS:  Blood pressure 105/69, pulse 97, temperature 98.5 F (36.9 C), temperature source Oral, resp. rate 18, height '6\' 2"'$  (1.88 m), weight 60.782 kg (134 lb), SpO2 96 %.  I/O:  No intake or output data in the 24 hours ending 08/10/15 1436  PHYSICAL EXAMINATION:  Physical Exam  GENERAL:  59 y.o.-year-old patient lying in the bed with no acute distress. Thin LUNGS: Normal breath sounds bilaterally, no wheezing, rales,rhonchi or crepitation. No use of accessory muscles of respiration.  CARDIOVASCULAR: S1, S2 normal. No murmurs, rubs, or gallops.  ABDOMEN: Soft, non-tender, non-distended. Bowel sounds present. No organomegaly or mass. PEG tube NEUROLOGIC: Moves all 4 extremities. PSYCHIATRIC: The patient is alert and oriented x 3.  SKIN: No obvious rash, lesion, or ulcer.   DATA REVIEW:   CBC  Recent Labs Lab 08/07/15 0411  WBC 5.3  HGB 9.8*  HCT 29.6*  PLT 251    Chemistries   Recent Labs Lab 08/05/15 0412 08/08/15 0423  NA  --  132*  K  --  4.0  CL  --  102  CO2  --  19*  GLUCOSE  --  68  BUN  --  <5*  CREATININE  --  0.60*  CALCIUM  --  8.3*  MG 1.7  --     Cardiac Enzymes No results for input(s): TROPONINI in the last 168 hours.  Microbiology Results  Results for orders placed or  performed during the hospital encounter of 08/03/15  Urine culture     Status: None   Collection Time: 08/03/15  7:00 PM  Result Value Ref Range Status   Specimen Description URINE, CLEAN CATCH  Final   Special Requests Immunocompromised  Final   Culture   Final    60,000 COLONIES/ml PSEUDOMONAS AERUGINOSA 20,000 COLONIES/mL ESCHERICHIA COLI    Report Status 08/08/2015 FINAL  Final   Organism ID, Bacteria PSEUDOMONAS AERUGINOSA  Final   Organism ID, Bacteria ESCHERICHIA COLI  Final      Susceptibility   Escherichia coli - MIC*    AMPICILLIN <=2 SENSITIVE Sensitive     CEFAZOLIN <=4 SENSITIVE Sensitive     CEFTRIAXONE <=1 SENSITIVE Sensitive     CIPROFLOXACIN <=0.25 SENSITIVE Sensitive     GENTAMICIN <=1 SENSITIVE Sensitive  IMIPENEM <=0.25 SENSITIVE Sensitive     NITROFURANTOIN <=16 SENSITIVE Sensitive     TRIMETH/SULFA <=20 SENSITIVE Sensitive     PIP/TAZO Value in next row Sensitive      SENSITIVE<=4    * 20,000 COLONIES/mL ESCHERICHIA COLI   Pseudomonas aeruginosa - MIC*    CEFTAZIDIME Value in next row Sensitive      SENSITIVE<=4    CIPROFLOXACIN Value in next row Intermediate      SENSITIVE<=4    GENTAMICIN Value in next row Sensitive      SENSITIVE<=4    IMIPENEM Value in next row Sensitive      SENSITIVE<=4    PIP/TAZO Value in next row Sensitive      SENSITIVE8    CEFEPIME Value in next row Sensitive      SENSITIVE2    * 60,000 COLONIES/ml PSEUDOMONAS AERUGINOSA    RADIOLOGY:  No results found.    Follow up with PCP in 1 week.  Management plans discussed with the patient, family and they are in agreement.  CODE STATUS:   TOTAL TIME TAKING CARE OF THIS PATIENT ON DAY OF DISCHARGE: more than 30 minutes.    Hillary Bow R M.D on 08/10/2015 at 2:36 PM  Between 7am to 6pm - Pager - [phone]  After 6pm go to [email or web address] - password [email or web address] [facility]  Office  [phone]  CC: Primary care physician; Fae Pippin     Note: This dictation was prepared with Dragon dictation along with smaller phrase technology. Any transcriptional errors that result from this process are unintentional.

## 2015-08-13 ENCOUNTER — Ambulatory Visit
Admission: RE | Admit: 2015-08-13 | Discharge: 2015-08-13 | Disposition: A | Discharge status: Home / Self Care | Payer: Medicare Other | Source: Ambulatory Visit | Attending: Radiation Oncology | Admitting: Radiation Oncology

## 2015-08-13 ENCOUNTER — Encounter: Payer: Self-pay | Admitting: Oncology

## 2015-08-13 ENCOUNTER — Inpatient Hospital Stay: Payer: Medicare Other

## 2015-08-13 ENCOUNTER — Inpatient Hospital Stay: Payer: Medicare Other | Attending: Oncology

## 2015-08-13 ENCOUNTER — Inpatient Hospital Stay (HOSPITAL_BASED_OUTPATIENT_CLINIC_OR_DEPARTMENT_OTHER): Payer: Medicare Other | Admitting: Oncology

## 2015-08-13 VITALS — BP 112/76 | HR 80 | Temp 96.3°F | Wt 132.2 lb

## 2015-08-13 DIAGNOSIS — Z87891 Personal history of nicotine dependence: Secondary | ICD-10-CM | POA: Insufficient documentation

## 2015-08-13 DIAGNOSIS — E7800 Pure hypercholesterolemia, unspecified: Secondary | ICD-10-CM | POA: Insufficient documentation

## 2015-08-13 DIAGNOSIS — R1310 Dysphagia, unspecified: Secondary | ICD-10-CM | POA: Insufficient documentation

## 2015-08-13 DIAGNOSIS — E860 Dehydration: Secondary | ICD-10-CM | POA: Diagnosis not present

## 2015-08-13 DIAGNOSIS — R112 Nausea with vomiting, unspecified: Secondary | ICD-10-CM | POA: Insufficient documentation

## 2015-08-13 DIAGNOSIS — Z931 Gastrostomy status: Secondary | ICD-10-CM | POA: Insufficient documentation

## 2015-08-13 DIAGNOSIS — C029 Malignant neoplasm of tongue, unspecified: Secondary | ICD-10-CM

## 2015-08-13 DIAGNOSIS — Z5111 Encounter for antineoplastic chemotherapy: Secondary | ICD-10-CM | POA: Insufficient documentation

## 2015-08-13 DIAGNOSIS — K219 Gastro-esophageal reflux disease without esophagitis: Secondary | ICD-10-CM | POA: Insufficient documentation

## 2015-08-13 DIAGNOSIS — K259 Gastric ulcer, unspecified as acute or chronic, without hemorrhage or perforation: Secondary | ICD-10-CM | POA: Insufficient documentation

## 2015-08-13 DIAGNOSIS — Z85810 Personal history of malignant neoplasm of tongue: Secondary | ICD-10-CM | POA: Insufficient documentation

## 2015-08-13 DIAGNOSIS — C01 Malignant neoplasm of base of tongue: Secondary | ICD-10-CM | POA: Diagnosis not present

## 2015-08-13 DIAGNOSIS — Z510 Encounter for antineoplastic radiation therapy: Secondary | ICD-10-CM | POA: Diagnosis not present

## 2015-08-13 DIAGNOSIS — I10 Essential (primary) hypertension: Secondary | ICD-10-CM | POA: Insufficient documentation

## 2015-08-13 DIAGNOSIS — R197 Diarrhea, unspecified: Secondary | ICD-10-CM | POA: Diagnosis not present

## 2015-08-13 DIAGNOSIS — Z79899 Other long term (current) drug therapy: Secondary | ICD-10-CM | POA: Insufficient documentation

## 2015-08-13 DIAGNOSIS — R634 Abnormal weight loss: Secondary | ICD-10-CM | POA: Insufficient documentation

## 2015-08-13 MED ORDER — OXYCODONE HCL 30 MG PO TABS: 30.0000 mg | ORAL_TABLET | Freq: Every day | ORAL | Status: DC

## 2015-08-13 MED ORDER — HEPARIN SOD (PORK) LOCK FLUSH 100 UNIT/ML IV SOLN
500.0000 [IU] | Freq: Once | INTRAVENOUS | Status: AC
  Administered 2015-08-13: 500 [IU] via INTRAVENOUS
  Filled 2015-08-13: qty 5

## 2015-08-13 MED ORDER — SODIUM CHLORIDE 0.9 % IJ SOLN
10.0000 mL | INTRAMUSCULAR | Status: DC | PRN
  Administered 2015-08-13: 10 mL via INTRAVENOUS
  Filled 2015-08-13: qty 10

## 2015-08-13 MED ORDER — METOCLOPRAMIDE HCL 5 MG/ML IJ SOLN
10.0000 mg | Freq: Once | INTRAMUSCULAR | Status: AC
  Administered 2015-08-13: 10 mg via INTRAVENOUS
  Filled 2015-08-13: qty 2

## 2015-08-13 MED ORDER — SODIUM CHLORIDE 0.9 % IV SOLN
Freq: Once | INTRAVENOUS | Status: AC
  Administered 2015-08-13: 12:00:00 via INTRAVENOUS
  Filled 2015-08-13: qty 1000

## 2015-08-13 MED ORDER — PANTOPRAZOLE SODIUM 40 MG IV SOLR
40.0000 mg | Freq: Once | INTRAVENOUS | Status: AC
  Administered 2015-08-13: 40 mg via INTRAVENOUS
  Filled 2015-08-13: qty 40

## 2015-08-13 NOTE — Progress Notes (Signed)
Patient does not have living will.  Former smoker.  Patient needs pain medication adjusted with new rx due to having to take more than prescribed to cover pain.  No appetite. Unable to swallow except for a little pill or sip of water. Yellow bracelet placed on patient.  Educated patient to wear it to all appts even lab only appts.

## 2015-08-14 ENCOUNTER — Inpatient Hospital Stay: Payer: Medicare Other

## 2015-08-14 ENCOUNTER — Ambulatory Visit
Admission: RE | Admit: 2015-08-14 | Discharge: 2015-08-14 | Disposition: A | Discharge status: Home / Self Care | Payer: Medicare Other | Source: Ambulatory Visit | Attending: Radiation Oncology | Admitting: Radiation Oncology

## 2015-08-14 DIAGNOSIS — K219 Gastro-esophageal reflux disease without esophagitis: Secondary | ICD-10-CM | POA: Diagnosis not present

## 2015-08-14 DIAGNOSIS — K259 Gastric ulcer, unspecified as acute or chronic, without hemorrhage or perforation: Secondary | ICD-10-CM | POA: Diagnosis not present

## 2015-08-14 DIAGNOSIS — Z5111 Encounter for antineoplastic chemotherapy: Secondary | ICD-10-CM | POA: Diagnosis not present

## 2015-08-14 DIAGNOSIS — Z79899 Other long term (current) drug therapy: Secondary | ICD-10-CM | POA: Diagnosis not present

## 2015-08-14 DIAGNOSIS — C029 Malignant neoplasm of tongue, unspecified: Secondary | ICD-10-CM

## 2015-08-14 DIAGNOSIS — Z931 Gastrostomy status: Secondary | ICD-10-CM | POA: Diagnosis not present

## 2015-08-14 DIAGNOSIS — R197 Diarrhea, unspecified: Secondary | ICD-10-CM | POA: Diagnosis not present

## 2015-08-14 DIAGNOSIS — Z85810 Personal history of malignant neoplasm of tongue: Secondary | ICD-10-CM | POA: Diagnosis not present

## 2015-08-14 DIAGNOSIS — E860 Dehydration: Secondary | ICD-10-CM | POA: Diagnosis not present

## 2015-08-14 DIAGNOSIS — Z510 Encounter for antineoplastic radiation therapy: Secondary | ICD-10-CM | POA: Diagnosis not present

## 2015-08-14 DIAGNOSIS — Z87891 Personal history of nicotine dependence: Secondary | ICD-10-CM | POA: Diagnosis not present

## 2015-08-14 DIAGNOSIS — R634 Abnormal weight loss: Secondary | ICD-10-CM | POA: Diagnosis not present

## 2015-08-14 DIAGNOSIS — C01 Malignant neoplasm of base of tongue: Secondary | ICD-10-CM | POA: Diagnosis not present

## 2015-08-14 DIAGNOSIS — E7800 Pure hypercholesterolemia, unspecified: Secondary | ICD-10-CM | POA: Diagnosis not present

## 2015-08-14 DIAGNOSIS — R1310 Dysphagia, unspecified: Secondary | ICD-10-CM | POA: Diagnosis not present

## 2015-08-14 DIAGNOSIS — I10 Essential (primary) hypertension: Secondary | ICD-10-CM | POA: Diagnosis not present

## 2015-08-14 DIAGNOSIS — R112 Nausea with vomiting, unspecified: Secondary | ICD-10-CM | POA: Diagnosis not present

## 2015-08-14 MED ORDER — PANTOPRAZOLE SODIUM 40 MG IV SOLR
40.0000 mg | Freq: Once | INTRAVENOUS | Status: AC
  Administered 2015-08-14: 40 mg via INTRAVENOUS
  Filled 2015-08-14: qty 40

## 2015-08-14 MED ORDER — HEPARIN SOD (PORK) LOCK FLUSH 100 UNIT/ML IV SOLN
500.0000 [IU] | Freq: Once | INTRAVENOUS | Status: AC
  Administered 2015-08-14: 500 [IU] via INTRAVENOUS

## 2015-08-14 MED ORDER — SODIUM CHLORIDE 0.9 % IV SOLN
Freq: Once | INTRAVENOUS | Status: AC
  Administered 2015-08-14: 12:00:00 via INTRAVENOUS
  Filled 2015-08-14: qty 1000

## 2015-08-14 MED ORDER — HEPARIN SOD (PORK) LOCK FLUSH 100 UNIT/ML IV SOLN
INTRAVENOUS | Status: AC
  Filled 2015-08-14: qty 5

## 2015-08-14 MED ORDER — METOCLOPRAMIDE HCL 5 MG/ML IJ SOLN
10.0000 mg | Freq: Once | INTRAMUSCULAR | Status: AC
  Administered 2015-08-14: 10 mg via INTRAVENOUS

## 2015-08-15 ENCOUNTER — Inpatient Hospital Stay: Payer: Medicare Other

## 2015-08-15 ENCOUNTER — Ambulatory Visit
Admission: RE | Admit: 2015-08-15 | Discharge: 2015-08-15 | Disposition: A | Discharge status: Home / Self Care | Payer: Medicare Other | Source: Ambulatory Visit | Attending: Radiation Oncology | Admitting: Radiation Oncology

## 2015-08-15 ENCOUNTER — Inpatient Hospital Stay (HOSPITAL_BASED_OUTPATIENT_CLINIC_OR_DEPARTMENT_OTHER): Payer: Medicare Other | Admitting: Oncology

## 2015-08-15 VITALS — BP 116/80 | HR 90 | Temp 96.4°F | Wt 134.3 lb

## 2015-08-15 DIAGNOSIS — K259 Gastric ulcer, unspecified as acute or chronic, without hemorrhage or perforation: Secondary | ICD-10-CM | POA: Diagnosis not present

## 2015-08-15 DIAGNOSIS — C01 Malignant neoplasm of base of tongue: Secondary | ICD-10-CM | POA: Diagnosis not present

## 2015-08-15 DIAGNOSIS — R197 Diarrhea, unspecified: Secondary | ICD-10-CM | POA: Diagnosis not present

## 2015-08-15 DIAGNOSIS — Z79899 Other long term (current) drug therapy: Secondary | ICD-10-CM

## 2015-08-15 DIAGNOSIS — Z931 Gastrostomy status: Secondary | ICD-10-CM | POA: Diagnosis not present

## 2015-08-15 DIAGNOSIS — E860 Dehydration: Secondary | ICD-10-CM | POA: Diagnosis not present

## 2015-08-15 DIAGNOSIS — R1310 Dysphagia, unspecified: Secondary | ICD-10-CM | POA: Diagnosis not present

## 2015-08-15 DIAGNOSIS — E7800 Pure hypercholesterolemia, unspecified: Secondary | ICD-10-CM | POA: Diagnosis not present

## 2015-08-15 DIAGNOSIS — I10 Essential (primary) hypertension: Secondary | ICD-10-CM | POA: Diagnosis not present

## 2015-08-15 DIAGNOSIS — R634 Abnormal weight loss: Secondary | ICD-10-CM

## 2015-08-15 DIAGNOSIS — Z85810 Personal history of malignant neoplasm of tongue: Secondary | ICD-10-CM

## 2015-08-15 DIAGNOSIS — R112 Nausea with vomiting, unspecified: Secondary | ICD-10-CM | POA: Diagnosis not present

## 2015-08-15 DIAGNOSIS — Z510 Encounter for antineoplastic radiation therapy: Secondary | ICD-10-CM | POA: Diagnosis not present

## 2015-08-15 DIAGNOSIS — C029 Malignant neoplasm of tongue, unspecified: Secondary | ICD-10-CM | POA: Diagnosis not present

## 2015-08-15 DIAGNOSIS — Z87891 Personal history of nicotine dependence: Secondary | ICD-10-CM | POA: Diagnosis not present

## 2015-08-15 DIAGNOSIS — Z5111 Encounter for antineoplastic chemotherapy: Secondary | ICD-10-CM | POA: Diagnosis not present

## 2015-08-15 DIAGNOSIS — K219 Gastro-esophageal reflux disease without esophagitis: Secondary | ICD-10-CM | POA: Diagnosis not present

## 2015-08-15 LAB — COMPREHENSIVE METABOLIC PANEL
ALT: 9 U/L — AB (ref 17–63)
ANION GAP: 6 (ref 5–15)
AST: 15 U/L (ref 15–41)
Albumin: 2.6 g/dL — ABNORMAL LOW (ref 3.5–5.0)
Alkaline Phosphatase: 63 U/L (ref 38–126)
BUN: 8 mg/dL (ref 6–20)
CALCIUM: 7.9 mg/dL — AB (ref 8.9–10.3)
CHLORIDE: 100 mmol/L — AB (ref 101–111)
CO2: 24 mmol/L (ref 22–32)
CREATININE: 0.58 mg/dL — AB (ref 0.61–1.24)
Glucose, Bld: 93 mg/dL (ref 65–99)
Potassium: 3.3 mmol/L — ABNORMAL LOW (ref 3.5–5.1)
Sodium: 130 mmol/L — ABNORMAL LOW (ref 135–145)
Total Bilirubin: 0.5 mg/dL (ref 0.3–1.2)
Total Protein: 6 g/dL — ABNORMAL LOW (ref 6.5–8.1)

## 2015-08-15 LAB — CBC WITH DIFFERENTIAL/PLATELET
Basophils Absolute: 0 10*3/uL (ref 0–0.1)
Basophils Relative: 0 %
EOS ABS: 0 10*3/uL (ref 0–0.7)
EOS PCT: 1 %
HCT: 29.8 % — ABNORMAL LOW (ref 40.0–52.0)
Hemoglobin: 10.1 g/dL — ABNORMAL LOW (ref 13.0–18.0)
LYMPHS ABS: 1.3 10*3/uL (ref 1.0–3.6)
LYMPHS PCT: 26 %
MCH: 31.9 pg (ref 26.0–34.0)
MCHC: 33.8 g/dL (ref 32.0–36.0)
MCV: 94.6 fL (ref 80.0–100.0)
MONO ABS: 0.9 10*3/uL (ref 0.2–1.0)
MONOS PCT: 17 %
Neutro Abs: 2.9 10*3/uL (ref 1.4–6.5)
Neutrophils Relative %: 56 %
PLATELETS: 340 10*3/uL (ref 150–440)
RBC: 3.15 MIL/uL — AB (ref 4.40–5.90)
RDW: 13.3 % (ref 11.5–14.5)
WBC: 5.2 10*3/uL (ref 3.8–10.6)

## 2015-08-15 LAB — MAGNESIUM: MAGNESIUM: 1.7 mg/dL (ref 1.7–2.4)

## 2015-08-15 MED ORDER — SODIUM CHLORIDE 0.9 % IJ SOLN
10.0000 mL | Freq: Once | INTRAMUSCULAR | Status: AC
  Administered 2015-08-15: 10 mL via INTRAVENOUS
  Filled 2015-08-15: qty 10

## 2015-08-15 MED ORDER — OXYCODONE HCL 30 MG PO TABS: ORAL_TABLET | ORAL | Status: DC

## 2015-08-15 MED ORDER — HEPARIN SOD (PORK) LOCK FLUSH 100 UNIT/ML IV SOLN
INTRAVENOUS | Status: AC
  Filled 2015-08-15: qty 5

## 2015-08-15 MED ORDER — HEPARIN SOD (PORK) LOCK FLUSH 100 UNIT/ML IV SOLN
500.0000 [IU] | Freq: Once | INTRAVENOUS | Status: AC
  Administered 2015-08-15: 500 [IU] via INTRAVENOUS

## 2015-08-15 MED ORDER — METOCLOPRAMIDE HCL 5 MG/ML IJ SOLN
10.0000 mg | Freq: Once | INTRAMUSCULAR | Status: AC
Start: 2015-08-15 — End: 2015-08-15
  Administered 2015-08-15: 10 mg via INTRAVENOUS
  Filled 2015-08-15: qty 2

## 2015-08-15 MED ORDER — SODIUM CHLORIDE 0.9 % IV SOLN
Freq: Once | INTRAVENOUS | Status: AC
  Administered 2015-08-15: 13:00:00 via INTRAVENOUS
  Filled 2015-08-15: qty 2

## 2015-08-15 MED ORDER — SODIUM CHLORIDE 0.9 % IV SOLN
Freq: Once | INTRAVENOUS | Status: AC
  Administered 2015-08-15: 12:00:00 via INTRAVENOUS
  Filled 2015-08-15: qty 1000

## 2015-08-15 NOTE — Progress Notes (Signed)
Patient does not have living will.  Former smoker. 

## 2015-08-16 ENCOUNTER — Other Ambulatory Visit: Payer: Self-pay | Admitting: *Deleted

## 2015-08-16 ENCOUNTER — Ambulatory Visit
Admission: RE | Admit: 2015-08-16 | Discharge: 2015-08-16 | Disposition: A | Discharge status: Home / Self Care | Payer: Medicare Other | Source: Ambulatory Visit | Attending: Radiation Oncology | Admitting: Radiation Oncology

## 2015-08-16 ENCOUNTER — Inpatient Hospital Stay: Payer: Medicare Other

## 2015-08-16 VITALS — BP 124/77 | HR 85 | Temp 96.2°F

## 2015-08-16 DIAGNOSIS — Z23 Encounter for immunization: Secondary | ICD-10-CM | POA: Diagnosis not present

## 2015-08-16 DIAGNOSIS — K259 Gastric ulcer, unspecified as acute or chronic, without hemorrhage or perforation: Secondary | ICD-10-CM | POA: Diagnosis not present

## 2015-08-16 DIAGNOSIS — C029 Malignant neoplasm of tongue, unspecified: Secondary | ICD-10-CM | POA: Diagnosis not present

## 2015-08-16 DIAGNOSIS — R112 Nausea with vomiting, unspecified: Secondary | ICD-10-CM | POA: Diagnosis not present

## 2015-08-16 DIAGNOSIS — R197 Diarrhea, unspecified: Secondary | ICD-10-CM | POA: Diagnosis not present

## 2015-08-16 DIAGNOSIS — E860 Dehydration: Secondary | ICD-10-CM | POA: Diagnosis not present

## 2015-08-16 DIAGNOSIS — K264 Chronic or unspecified duodenal ulcer with hemorrhage: Secondary | ICD-10-CM | POA: Diagnosis not present

## 2015-08-16 DIAGNOSIS — Z87891 Personal history of nicotine dependence: Secondary | ICD-10-CM | POA: Diagnosis not present

## 2015-08-16 DIAGNOSIS — Z79899 Other long term (current) drug therapy: Secondary | ICD-10-CM | POA: Diagnosis not present

## 2015-08-16 DIAGNOSIS — Z5111 Encounter for antineoplastic chemotherapy: Secondary | ICD-10-CM | POA: Diagnosis not present

## 2015-08-16 DIAGNOSIS — M25559 Pain in unspecified hip: Secondary | ICD-10-CM | POA: Diagnosis not present

## 2015-08-16 DIAGNOSIS — R634 Abnormal weight loss: Secondary | ICD-10-CM | POA: Diagnosis not present

## 2015-08-16 DIAGNOSIS — I10 Essential (primary) hypertension: Secondary | ICD-10-CM | POA: Diagnosis not present

## 2015-08-16 DIAGNOSIS — R1310 Dysphagia, unspecified: Secondary | ICD-10-CM | POA: Diagnosis not present

## 2015-08-16 DIAGNOSIS — Z510 Encounter for antineoplastic radiation therapy: Secondary | ICD-10-CM | POA: Diagnosis not present

## 2015-08-16 DIAGNOSIS — Z85810 Personal history of malignant neoplasm of tongue: Secondary | ICD-10-CM | POA: Diagnosis not present

## 2015-08-16 DIAGNOSIS — G90521 Complex regional pain syndrome I of right lower limb: Secondary | ICD-10-CM | POA: Diagnosis not present

## 2015-08-16 DIAGNOSIS — E7800 Pure hypercholesterolemia, unspecified: Secondary | ICD-10-CM | POA: Diagnosis not present

## 2015-08-16 DIAGNOSIS — K219 Gastro-esophageal reflux disease without esophagitis: Secondary | ICD-10-CM | POA: Diagnosis not present

## 2015-08-16 DIAGNOSIS — C01 Malignant neoplasm of base of tongue: Secondary | ICD-10-CM | POA: Diagnosis not present

## 2015-08-16 DIAGNOSIS — Z931 Gastrostomy status: Secondary | ICD-10-CM | POA: Diagnosis not present

## 2015-08-16 MED ORDER — HEPARIN SOD (PORK) LOCK FLUSH 100 UNIT/ML IV SOLN
INTRAVENOUS | Status: AC
  Filled 2015-08-16: qty 5

## 2015-08-16 MED ORDER — HEPARIN SOD (PORK) LOCK FLUSH 100 UNIT/ML IV SOLN
500.0000 [IU] | Freq: Once | INTRAVENOUS | Status: AC
  Administered 2015-08-16: 500 [IU] via INTRAVENOUS

## 2015-08-16 MED ORDER — METOCLOPRAMIDE HCL 5 MG/ML IJ SOLN
10.0000 mg | Freq: Once | INTRAMUSCULAR | Status: AC
  Administered 2015-08-16: 10 mg via INTRAVENOUS
  Filled 2015-08-16: qty 2

## 2015-08-16 MED ORDER — SODIUM CHLORIDE 0.9 % IV SOLN
Freq: Once | INTRAVENOUS | Status: AC
  Administered 2015-08-16: 10:00:00 via INTRAVENOUS
  Filled 2015-08-16: qty 2

## 2015-08-16 MED ORDER — SODIUM CHLORIDE 0.9 % IV SOLN
Freq: Once | INTRAVENOUS | Status: AC
  Administered 2015-08-16: 10:00:00 via INTRAVENOUS
  Filled 2015-08-16: qty 1000

## 2015-08-17 ENCOUNTER — Ambulatory Visit
Admission: RE | Admit: 2015-08-17 | Discharge: 2015-08-17 | Disposition: A | Discharge status: Home / Self Care | Payer: Medicare Other | Source: Ambulatory Visit | Attending: Radiation Oncology | Admitting: Radiation Oncology

## 2015-08-17 ENCOUNTER — Inpatient Hospital Stay: Payer: Medicare Other

## 2015-08-17 ENCOUNTER — Encounter: Payer: Self-pay | Admitting: Oncology

## 2015-08-17 VITALS — BP 129/81 | HR 73 | Temp 96.4°F

## 2015-08-17 DIAGNOSIS — K259 Gastric ulcer, unspecified as acute or chronic, without hemorrhage or perforation: Secondary | ICD-10-CM | POA: Diagnosis not present

## 2015-08-17 DIAGNOSIS — C01 Malignant neoplasm of base of tongue: Secondary | ICD-10-CM | POA: Diagnosis not present

## 2015-08-17 DIAGNOSIS — R197 Diarrhea, unspecified: Secondary | ICD-10-CM | POA: Diagnosis not present

## 2015-08-17 DIAGNOSIS — Z931 Gastrostomy status: Secondary | ICD-10-CM | POA: Diagnosis not present

## 2015-08-17 DIAGNOSIS — R112 Nausea with vomiting, unspecified: Secondary | ICD-10-CM | POA: Diagnosis not present

## 2015-08-17 DIAGNOSIS — R634 Abnormal weight loss: Secondary | ICD-10-CM | POA: Diagnosis not present

## 2015-08-17 DIAGNOSIS — R1310 Dysphagia, unspecified: Secondary | ICD-10-CM | POA: Diagnosis not present

## 2015-08-17 DIAGNOSIS — E7800 Pure hypercholesterolemia, unspecified: Secondary | ICD-10-CM | POA: Diagnosis not present

## 2015-08-17 DIAGNOSIS — Z85810 Personal history of malignant neoplasm of tongue: Secondary | ICD-10-CM | POA: Diagnosis not present

## 2015-08-17 DIAGNOSIS — Z510 Encounter for antineoplastic radiation therapy: Secondary | ICD-10-CM | POA: Diagnosis not present

## 2015-08-17 DIAGNOSIS — Z87891 Personal history of nicotine dependence: Secondary | ICD-10-CM | POA: Diagnosis not present

## 2015-08-17 DIAGNOSIS — Z79899 Other long term (current) drug therapy: Secondary | ICD-10-CM | POA: Diagnosis not present

## 2015-08-17 DIAGNOSIS — E860 Dehydration: Secondary | ICD-10-CM | POA: Diagnosis not present

## 2015-08-17 DIAGNOSIS — Z5111 Encounter for antineoplastic chemotherapy: Secondary | ICD-10-CM | POA: Diagnosis not present

## 2015-08-17 DIAGNOSIS — I10 Essential (primary) hypertension: Secondary | ICD-10-CM | POA: Diagnosis not present

## 2015-08-17 DIAGNOSIS — C029 Malignant neoplasm of tongue, unspecified: Secondary | ICD-10-CM

## 2015-08-17 DIAGNOSIS — K219 Gastro-esophageal reflux disease without esophagitis: Secondary | ICD-10-CM | POA: Diagnosis not present

## 2015-08-17 MED ORDER — METOCLOPRAMIDE HCL 5 MG/ML IJ SOLN
10.0000 mg | Freq: Once | INTRAMUSCULAR | Status: AC
  Administered 2015-08-17: 10 mg via INTRAVENOUS
  Filled 2015-08-17: qty 2

## 2015-08-17 MED ORDER — SODIUM CHLORIDE 0.9 % IJ SOLN
10.0000 mL | INTRAMUSCULAR | Status: DC | PRN
  Administered 2015-08-17: 10 mL
  Filled 2015-08-17: qty 10

## 2015-08-17 MED ORDER — SODIUM CHLORIDE 0.9 % IV SOLN
Freq: Once | INTRAVENOUS | Status: AC
  Administered 2015-08-17: 09:00:00 via INTRAVENOUS
  Filled 2015-08-17: qty 1000

## 2015-08-17 MED ORDER — HEPARIN SOD (PORK) LOCK FLUSH 100 UNIT/ML IV SOLN
500.0000 [IU] | Freq: Once | INTRAVENOUS | Status: AC | PRN
  Administered 2015-08-17: 500 [IU]
  Filled 2015-08-17: qty 5

## 2015-08-17 MED ORDER — SODIUM CHLORIDE 0.9 % IV SOLN
Freq: Once | INTRAVENOUS | Status: AC
  Administered 2015-08-17: 10:00:00 via INTRAVENOUS
  Filled 2015-08-17: qty 2

## 2015-08-17 NOTE — Progress Notes (Signed)
[place] @ [facility] [phone]) [phone]  Fax:([phone]) [place] OB: [birth year]  MR#: [id number]  EZM#:[id number]  Patient Care Team: Cyndi Bender, PA-C as PCP - General (Physician Assistant) Beverly Gust, MD (Unknown Physician Specialty)  CHIEF COMPLAINT:  Chief Complaint  Patient presents with  . OTHER   1.  Has a history of cancer of the tongue in 2006.  Patient underwent resection followed by radiation therapy 2.  Increasing difficulty in swallowing for last 2 or 3 weeks.  Patient had upper endoscopy done in December of 2015. 3.  Significant weight loss 4.  Abnormal PET scan (August of 2016) 5.  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  (Cis-platinum and radiation therapy) (September 2 016) T3 N0 M0 tumor Status post PEG tube placement (September, 2016)  VISIT DIAGNOSIS:  Significant weight loss. Difficulty swallowing. History of carcinoma of tongue    Oncology Flowsheet 08/08/2015 08/09/2015 08/09/2015 08/13/2015 08/14/2015 08/15/2015 08/16/2015  Day, Cycle - - - - - - -  CISplatin (PLATINOL) IV - - - - - - -  dexamethasone (DECADRON) IJ - - - - - - -  dexamethasone (DECADRON) IV - - - - - [ 4 mg ] [ 4 mg ]  enoxaparin (LOVENOX) [place]   - - - - - -  fosaprepitant (EMEND) IV - - - - - - -  granisetron (KYTRIL) PO - - - - - - -  LORazepam (ATIVAN) IV - - - - - - -  metoCLOPramide (REGLAN) IV 10 _0  mg 10 mg  ondansetron (ZOFRAN) IV 4 mg 4 mg 4 mg - - [ 4 mg ] [ 4 mg ]  palonosetron (ALOXI) IV - - - - - - -  promethazine (PHENERGAN) IM - - - - - - -  promethazine (PHENERGAN) IV   - - - - - -    INTERVAL HISTORY:  I received a phone call from ENT surgeon to see this 59 year old gentleman who had been seen by me several years ago.  Patient had a history of carcinoma of lung (exact state not known) old chart being reviewed.  At  present old records are not available on EMR. According to patient he had been doing fairly good.  He quit smoking in November.  December had upper endoscopy because of difficulty swallowing.  No abnormality detected patient underwent esophageal dilation. Recently he has increasing difficulty in swallowing but that is in right at the level off larynx and beginning of esophagus. Has not lost significant amount of weight.  Soreness in the mouth.  Patient has been treated with antifungal antibiotics without much relief.  Patient has lost approximately 40-50 pounds of weight. Also has increasing cough.  Yellowish expectoration.  Patient had been a chronic smoker and quit smoking in November of 2015 October, 2016\ Patient had multiple admission in the hospital with nausea vomiting.  During last admission in the hospital upper endoscopy revealed gastric ulcer and gastritis Patient started feeling somewhat better now with nausea and vomiting under control For the first time started eating a little bit better. Getting radiation therapy Continues to be very depressed and anxious   REVIEW OF SYSTEMS:   Gen. status: Patient is feeling weak and tired.  Has lost significant weight Poor appetite patient is very depressed.  Losing weight. Increasing difficulty swallowing as mentioned in history of  present illness HEENT: As mentioned in history of present illness patient had history of carcinoma of tongue status post radiation therapy recently having increasing soreness in the mouth. Lungs: Increasing cough shortness of breath yellowish expectoration no fever no hemoptysis GI: Persistent nausea as described above in history of present illness [place] close skeletal system no bony pain Lower extremity no swelling Skin: No rash Abdomen: Had a PEG tube placement  As per HPI. Otherwise, a complete review of systems is negatve.  PAST MEDICAL HISTORY: Carcinoma of  tongue Hypertension Hypercholesterolemia Previous substance abuse Gastroesophageal reflux disease  PAST SURGICAL HISTORY: Treated for carcinoma of tongue FAMILY HISTORY There is no significant family history of breast cancer, ovarian cancer, colon cancer    ADVANCED DIRECTIVES:  Patient does not have any living will or healthcare power of attorney.  Information was given .  Available resources had been discussed.  We will follow-up on subsequent appointments regarding this issue  HEALTH MAINTENANCE: Social History  Substance Use Topics  . Smoking status: Former Smoker -- 1.00 packs/day for 0 years    Types: Cigarettes    Quit date: 09/21/2014  . Smokeless tobacco: None  . Alcohol Use: 7.2 oz/week    12 Cans of beer per week     Comment: beer/wine every day    Quit smoking in November of 2015.  History of smoking for several years in the past.  No Known Allergies  Current Outpatient Prescriptions  Medication Sig Dispense Refill  . acidophilus (RISAQUAD) CAPS capsule Take 1 capsule by mouth daily.    . chlorhexidine (PERIDEX) 0.12 % solution 15 mLs by Mouth Rinse route 2 (two) times daily. 120 mL 0  . citalopram (CELEXA) 20 MG tablet Take 1 tablet (20 mg total) by mouth daily. 30 tablet 3  . fentaNYL (DURAGESIC - DOSED MCG/HR) 25 MCG/HR patch Place 2 patches (50 mcg total) onto the skin every 3 (three) days. 10 patch 0  . lidocaine-prilocaine (EMLA) cream Apply 1 application topically as needed. (Patient taking differently: Apply 1 application topically as needed (prior to accessing port). ) 30 g 3  . lisinopril (PRINIVIL,ZESTRIL) 10 MG tablet Take 10 mg by mouth daily.    . ondansetron (ZOFRAN) 4 MG tablet Take 1 tablet (4 mg total) by mouth every 4 (four) hours as needed for nausea or vomiting. 45 tablet 0  . oxycodone (ROXICODONE) 30 MG immediate release tablet Take 1 tablet orally 7 times daily 60 tablet 0  . pantoprazole sodium (PROTONIX) 40 mg/20 mL PACK Place 20 mLs (40  mg total) into feeding tube 2 (two) times daily. 30 each 0  . phenol (CHLORASEPTIC) 1.4 % LIQD Use as directed 1 spray in the mouth or throat as needed for throat irritation / pain. 177 mL 0  . pravastatin (PRAVACHOL) 40 MG tablet Take 40 mg by mouth daily.    . promethazine (PHENERGAN) 25 MG suppository Place 1 suppository (25 mg total) rectally every 8 (eight) hours as needed for nausea or vomiting. 12 each 0  . sucralfate (CARAFATE) 1 GM/10ML suspension Take 10 mLs (1 g total) by mouth 4 (four) times daily -  with meals and at bedtime. 420 mL 0  . traZODone (DESYREL) 100 MG tablet Take 100 mg by mouth at bedtime.     No current facility-administered medications for this visit.   Facility-Administered Medications Ordered in Other Visits  Medication Dose Route Frequency Provider Last Rate Last Dose  . heparin lock flush 100 unit/mL  500 Units  Intracatheter Once PRN Forest Gleason, MD      . metoCLOPramide (REGLAN) injection 10 mg  10 mg Intravenous Once Forest Gleason, MD      . ondansetron (ZOFRAN) 4 mg, dexamethasone (DECADRON) 4 mg in sodium chloride 0.9 % 50 mL IVPB   Intravenous Once Forest Gleason, MD      . sodium chloride 0.9 % injection 10 mL  10 mL Intracatheter PRN Forest Gleason, MD   10 mL at 08/17/15 [id number]    OBJECTIVE: PHYSICAL EXAM: Gen. status: Patient is seen lean and cachectic. HEENT: No soreness in the mouth.  But swelling which is diffuse Lymphatic system: Supraclavicular, cervical, axillary, inguinal lymph nodes are not palpable Lungs: Bilateral rhonchi and occasional crepitation. Cardiac: Tachycardia Examination of the skin revealed no evidence of significant rashes, suspicious appearing nevi or other concerning lesions.. Abdominal exam revealed normal bowel sounds. The abdomen was soft, non-tender, and without masses, organomegaly, or appreciable enlargement of the abdominal aorta..   Peg  placement Psychiatric system: Depression and anxiety  Filed Vitals:   08/15/15  1041  BP: 116/80  Pulse: 90  Temp: 96.4 F (35.8 C)     Body mass index is 17.23 kg/(m^2).    ECOG FS:1 - Symptomatic but completely ambulatory  LAB RESULTS:  Infusion on 08/15/2015  Component Date Value Ref Range Status  . WBC 08/15/2015 5.2  3.8 - 10.6 K/uL Final  . RBC 08/15/2015 3.15* 4.40 - 5.90 MIL/uL Final  . Hemoglobin 08/15/2015 10.1* 13.0 - 18.0 g/dL Final  . HCT 08/15/2015 29.8* 40.0 - 52.0 % Final  . MCV 08/15/2015 94.6  80.0 - 100.0 fL Final  . MCH 08/15/2015 31.9  26.0 - 34.0 pg Final  . MCHC 08/15/2015 33.8  32.0 - 36.0 g/dL Final  . RDW 08/15/2015 13.3  11.5 - 14.5 % Final  . Platelets 08/15/2015 340  150 - 440 K/uL Final  . Neutrophils Relative % 08/15/2015 56   Final  . Neutro Abs 08/15/2015 2.9  1.4 - 6.5 K/uL Final  . Lymphocytes Relative 08/15/2015 26   Final  . Lymphs Abs 08/15/2015 1.3  1.0 - 3.6 K/uL Final  . Monocytes Relative 08/15/2015 17   Final  . Monocytes Absolute 08/15/2015 0.9  0.2 - 1.0 K/uL Final  . Eosinophils Relative 08/15/2015 1   Final  . Eosinophils Absolute 08/15/2015 0.0  0 - 0.7 K/uL Final  . Basophils Relative 08/15/2015 0   Final  . Basophils Absolute 08/15/2015 0.0  0 - 0.1 K/uL Final  . Sodium 08/15/2015 130* 135 - 145 mmol/L Final  . Potassium 08/15/2015 3.3* 3.5 - 5.1 mmol/L Final  . Chloride 08/15/2015 100* 101 - 111 mmol/L Final  . CO2 08/15/2015 24  22 - 32 mmol/L Final  . Glucose, Bld 08/15/2015 93  65 - 99 mg/dL Final  . BUN 08/15/2015 8  6 - 20 mg/dL Final  . Creatinine, Ser 08/15/2015 0.58* 0.61 - 1.24 mg/dL Final  . Calcium 08/15/2015 7.9* 8.9 - 10.3 mg/dL Final  . Total Protein 08/15/2015 6.0* 6.5 - 8.1 g/dL Final  . Albumin 08/15/2015 2.6* 3.5 - 5.0 g/dL Final  . AST 08/15/2015 15  15 - 41 U/L Final  . ALT 08/15/2015 9* 17 - 63 U/L Final  . Alkaline Phosphatase 08/15/2015 63  38 - 126 U/L Final  . Total Bilirubin 08/15/2015 0.5  0.3 - 1.2 mg/dL Final  . GFR calc non Af Amer 08/15/2015 >60  >60 mL/min Final  . GFR  calc Af Amer 08/15/2015 >60  >60 mL/min Final   Comment: (NOTE) The eGFR has been calculated using the CKD EPI equation. This calculation has not been validated in all clinical situations. eGFR's persistently <60 mL/min signify possible Chronic Kidney Disease.   . Anion gap 08/15/2015 6  5 - 15 Final  . Magnesium 08/15/2015 1.7  1.7 - 2.4 mg/dL Final       ASSESSMENT:  A. 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  Carcinoma of tongue in 2006 status post resection and radiation therapy exit staging not known and old records being off pain for review 2.  Difficulty in swallowing 2-3 weeks duration.  Had a normal upper endoscopy in December of 2015 with esophageal dilated Patient history of peptic ulcer disease 3, significant weight loss 4, previous history of chronic drug abuse.  Chronic tobacco abuse.    PLAN:   Multiple gastric ulcer and gastritis may be the root cause of problem with an persistent nausea vomiting I would continue IV fluid IV steroid and nausea medication and IV Protonix Hold chemotherapy Consider starting cetuximab a patient nausea settles down from the next week Loading dose of cetuximab has been approved (patient will start that next week pending insurance approval Possibility of using a small dose of steroid intravenously can be considered to see if patient's appetite gets better Patient is agreeable to that approach IV fluid has been ordered 3 cetuximab it has been ordered next week IV steroid, Protonix has been ordered All lab data has been reviewed Discussed situation with Dr. Baruch Gouty who is continuing radiation therapy      No matching staging information was found for the patient.  Forest Gleason, MD   08/17/2015 9:29 AM

## 2015-08-20 ENCOUNTER — Inpatient Hospital Stay: Payer: Medicare Other

## 2015-08-20 ENCOUNTER — Telehealth: Payer: Self-pay | Admitting: *Deleted

## 2015-08-20 ENCOUNTER — Encounter: Payer: Self-pay | Admitting: Oncology

## 2015-08-20 ENCOUNTER — Inpatient Hospital Stay (HOSPITAL_BASED_OUTPATIENT_CLINIC_OR_DEPARTMENT_OTHER): Payer: Medicare Other | Admitting: Family Medicine

## 2015-08-20 ENCOUNTER — Ambulatory Visit: Payer: Medicare Other

## 2015-08-20 VITALS — BP 114/79 | HR 87 | Temp 97.3°F | Wt 133.5 lb

## 2015-08-20 DIAGNOSIS — Z931 Gastrostomy status: Secondary | ICD-10-CM

## 2015-08-20 DIAGNOSIS — C029 Malignant neoplasm of tongue, unspecified: Secondary | ICD-10-CM | POA: Diagnosis not present

## 2015-08-20 DIAGNOSIS — Z85810 Personal history of malignant neoplasm of tongue: Secondary | ICD-10-CM

## 2015-08-20 DIAGNOSIS — R112 Nausea with vomiting, unspecified: Secondary | ICD-10-CM | POA: Diagnosis not present

## 2015-08-20 DIAGNOSIS — K259 Gastric ulcer, unspecified as acute or chronic, without hemorrhage or perforation: Secondary | ICD-10-CM | POA: Diagnosis not present

## 2015-08-20 DIAGNOSIS — R197 Diarrhea, unspecified: Secondary | ICD-10-CM

## 2015-08-20 DIAGNOSIS — I10 Essential (primary) hypertension: Secondary | ICD-10-CM | POA: Diagnosis not present

## 2015-08-20 DIAGNOSIS — Z79899 Other long term (current) drug therapy: Secondary | ICD-10-CM

## 2015-08-20 DIAGNOSIS — Z5111 Encounter for antineoplastic chemotherapy: Secondary | ICD-10-CM | POA: Diagnosis not present

## 2015-08-20 DIAGNOSIS — R1310 Dysphagia, unspecified: Secondary | ICD-10-CM

## 2015-08-20 DIAGNOSIS — E7800 Pure hypercholesterolemia, unspecified: Secondary | ICD-10-CM | POA: Diagnosis not present

## 2015-08-20 DIAGNOSIS — R634 Abnormal weight loss: Secondary | ICD-10-CM | POA: Diagnosis not present

## 2015-08-20 DIAGNOSIS — K219 Gastro-esophageal reflux disease without esophagitis: Secondary | ICD-10-CM | POA: Diagnosis not present

## 2015-08-20 DIAGNOSIS — Z87891 Personal history of nicotine dependence: Secondary | ICD-10-CM | POA: Diagnosis not present

## 2015-08-20 DIAGNOSIS — E860 Dehydration: Secondary | ICD-10-CM

## 2015-08-20 LAB — CBC WITH DIFFERENTIAL/PLATELET
BASOS ABS: 0 10*3/uL (ref 0–0.1)
Basophils Relative: 0 %
Eosinophils Absolute: 0 10*3/uL (ref 0–0.7)
Eosinophils Relative: 0 %
HEMATOCRIT: 31.6 % — AB (ref 40.0–52.0)
HEMOGLOBIN: 10.6 g/dL — AB (ref 13.0–18.0)
LYMPHS PCT: 26 %
Lymphs Abs: 1.5 10*3/uL (ref 1.0–3.6)
MCH: 31.4 pg (ref 26.0–34.0)
MCHC: 33.5 g/dL (ref 32.0–36.0)
MCV: 93.7 fL (ref 80.0–100.0)
Monocytes Absolute: 0.5 10*3/uL (ref 0.2–1.0)
Monocytes Relative: 10 %
NEUTROS ABS: 3.6 10*3/uL (ref 1.4–6.5)
NEUTROS PCT: 64 %
Platelets: 360 10*3/uL (ref 150–440)
RBC: 3.37 MIL/uL — AB (ref 4.40–5.90)
RDW: 14 % (ref 11.5–14.5)
WBC: 5.6 10*3/uL (ref 3.8–10.6)

## 2015-08-20 LAB — COMPREHENSIVE METABOLIC PANEL
ALT: 9 U/L — AB (ref 17–63)
AST: 17 U/L (ref 15–41)
Albumin: 2.4 g/dL — ABNORMAL LOW (ref 3.5–5.0)
Alkaline Phosphatase: 61 U/L (ref 38–126)
Anion gap: 9 (ref 5–15)
BILIRUBIN TOTAL: 0.6 mg/dL (ref 0.3–1.2)
BUN: 11 mg/dL (ref 6–20)
CO2: 24 mmol/L (ref 22–32)
CREATININE: 0.62 mg/dL (ref 0.61–1.24)
Calcium: 8.1 mg/dL — ABNORMAL LOW (ref 8.9–10.3)
Chloride: 103 mmol/L (ref 101–111)
GFR calc Af Amer: >60 mL/min (ref >60)
GLUCOSE: 87 mg/dL (ref 65–99)
Potassium: 3.4 mmol/L — ABNORMAL LOW (ref 3.5–5.1)
Sodium: 136 mmol/L (ref 135–145)
TOTAL PROTEIN: 5.6 g/dL — AB (ref 6.5–8.1)

## 2015-08-20 LAB — MAGNESIUM: MAGNESIUM: 1.6 mg/dL — AB (ref 1.7–2.4)

## 2015-08-20 MED ORDER — SODIUM CHLORIDE 0.9 % IV SOLN
Freq: Once | INTRAVENOUS | Status: AC
  Administered 2015-08-20: 15:00:00 via INTRAVENOUS
  Filled 2015-08-20: qty 2

## 2015-08-20 MED ORDER — SODIUM CHLORIDE 0.9 % IV SOLN: 50.0000 mg | Freq: Once | INTRAVENOUS | Status: DC

## 2015-08-20 MED ORDER — SODIUM CHLORIDE 0.9 % IV SOLN
Freq: Once | INTRAVENOUS | Status: AC
  Administered 2015-08-20: 15:00:00 via INTRAVENOUS
  Filled 2015-08-20: qty 1000

## 2015-08-20 MED ORDER — FAMOTIDINE IN NACL 20-0.9 MG/50ML-% IV SOLN
20.0000 mg | Freq: Two times a day (BID) | INTRAVENOUS | Status: DC
  Administered 2015-08-20: 20 mg via INTRAVENOUS
  Filled 2015-08-20: qty 50

## 2015-08-20 MED ORDER — HEPARIN SOD (PORK) LOCK FLUSH 10 UNIT/ML IV SOLN
10.0000 [IU] | Freq: Once | INTRAVENOUS | Status: AC
  Administered 2015-08-20: 10 [IU] via INTRAVENOUS

## 2015-08-20 MED ORDER — HEPARIN SOD (PORK) LOCK FLUSH 100 UNIT/ML IV SOLN
250.0000 [IU] | Freq: Once | INTRAVENOUS | Status: DC | PRN
  Filled 2015-08-20: qty 5

## 2015-08-20 MED ORDER — SODIUM CHLORIDE 0.9 % IJ SOLN
10.0000 mL | INTRAMUSCULAR | Status: DC | PRN
  Administered 2015-08-20: 10 mL
  Filled 2015-08-20: qty 10

## 2015-08-20 NOTE — Progress Notes (Signed)
[place] @ [facility] [phone]  Fax:([phone]) [place] OB: 1956/10/14  MR#: [id number]  GYI#:[id number]  Patient Care Team: Cyndi Bender, PA-C as PCP - General (Physician Assistant) Beverly Gust, MD (Unknown Physician Specialty)  CHIEF COMPLAINT:  Chief Complaint  Patient presents with  . Pain   1.  Has a history of cancer of the tongue in 2006.  Patient underwent resection followed by radiation therapy 2.  Increasing difficulty in swallowing for last 2 or 3 weeks.  Patient had upper endoscopy done in December of 2015. 3.  Significant weight loss 4.  Abnormal PET scan (August of 2016) 5.  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  (Cis-platinum and radiation therapy) (September 2 016) T3 N0 M0 tumor Status post PEG tube placement (September, 2016)  VISIT DIAGNOSIS:  Significant weight loss. Difficulty swallowing. History of carcinoma of tongue    Oncology Flowsheet 08/09/2015 08/09/2015 08/13/2015 08/14/2015 08/15/2015 08/16/2015 08/17/2015  Day, Cycle - - - - - - -  CISplatin (PLATINOL) IV - - - - - - -  dexamethasone (DECADRON) IJ - - - - - - -  dexamethasone (DECADRON) IV - - - - [ 4 mg ] [ 4 mg ] [ 4 mg ]  enoxaparin (LOVENOX) [place] - - - - - - -  fosaprepitant (EMEND) IV - - - - - - -  granisetron (KYTRIL) PO - - - - - - -  LORazepam (ATIVAN) IV - - - - - - -  metoCLOPramide (REGLAN) IV 10 _0  mg 10 mg  ondansetron (ZOFRAN) IV 4 mg 4 mg - - [ 4 mg ] [ 4 mg ] [ 4 mg ]  palonosetron (ALOXI) IV - - - - - - -  promethazine (PHENERGAN) IM - - - - - - -  promethazine (PHENERGAN) IV - - - - - - -    INTERVAL HISTORY:  I received a phone call from ENT surgeon to see this 59 year old gentleman who had been seen by me several years ago.  Patient had a history of carcinoma of lung (exact state not known) old chart being reviewed.   At present old records are not available on EMR. According to patient he had been doing fairly good.  He quit smoking in November.  December had upper endoscopy because of difficulty swallowing.  No abnormality detected patient underwent esophageal dilation. Recently he has increasing difficulty in swallowing but that is in right at the level off larynx and beginning of esophagus. Has not lost significant amount of weight.  Soreness in the mouth.  Patient has been treated with antifungal antibiotics without much relief.  Patient has lost approximately 40-50 pounds of weight. Also has increasing cough.  Yellowish expectoration.  Patient had been a chronic smoker and quit smoking in November of 2015  Patient was again hospitalized with persistent nausea or endoscopy revealed gastric ulcers stomatitis patient continues to have persistent nausea and vomiting. Continuing radiation therapy   REVIEW OF SYSTEMS:   Gen. status: Patient is feeling weak and tired.  Has lost significant weight Poor appetite patient is very depressed.  Losing weight. Increasing difficulty swallowing as mentioned in history of present illness HEENT: As mentioned in history of present illness patient had history of carcinoma of tongue status post radiation therapy recently having increasing soreness in the mouth. Lungs: Increasing  cough shortness of breath yellowish expectoration no fever no hemoptysis GI no nausea no vomiting no diarrhea GU no dysuria hematuria skin: No rash neurological system no tingling numbness.. [place] close skeletal system no bony pain Lower extremity no swelling Skin: No rash Abdomen: Had a PEG tube placement  As per HPI. Otherwise, a complete review of systems is negatve.  PAST MEDICAL HISTORY: Carcinoma of tongue Hypertension Hypercholesterolemia Previous substance abuse Gastroesophageal reflux disease  PAST SURGICAL HISTORY: Treated for carcinoma of tongue FAMILY HISTORY There is no  significant family history of breast cancer, ovarian cancer, colon cancer    ADVANCED DIRECTIVES:  Patient does not have any living will or healthcare power of attorney.  Information was given .  Available resources had been discussed.  We will follow-up on subsequent appointments regarding this issue  HEALTH MAINTENANCE: Social History  Substance Use Topics  . Smoking status: Former Smoker -- 1.00 packs/day for 0 years    Types: Cigarettes    Quit date: 09/21/2014  . Smokeless tobacco: None  . Alcohol Use: 7.2 oz/week    12 Cans of beer per week     Comment: beer/wine every day    Quit smoking in November of 2015.  History of smoking for several years in the past.  No Known Allergies  Current Outpatient Prescriptions  Medication Sig Dispense Refill  . chlorhexidine (PERIDEX) 0.12 % solution 15 mLs by Mouth Rinse route 2 (two) times daily. 120 mL 0  . citalopram (CELEXA) 20 MG tablet Take 1 tablet (20 mg total) by mouth daily. 30 tablet 3  . fentaNYL (DURAGESIC - DOSED MCG/HR) 25 MCG/HR patch Place 2 patches (50 mcg total) onto the skin every 3 (three) days. 10 patch 0  . lidocaine-prilocaine (EMLA) cream Apply 1 application topically as needed. (Patient taking differently: Apply 1 application topically as needed (prior to accessing port). ) 30 g 3  . lisinopril (PRINIVIL,ZESTRIL) 10 MG tablet Take 10 mg by mouth daily.    . ondansetron (ZOFRAN) 4 MG tablet Take 1 tablet (4 mg total) by mouth every 4 (four) hours as needed for nausea or vomiting. 45 tablet 0  . pantoprazole sodium (PROTONIX) 40 mg/20 mL PACK Place 20 mLs (40 mg total) into feeding tube 2 (two) times daily. 30 each 0  . phenol (CHLORASEPTIC) 1.4 % LIQD Use as directed 1 spray in the mouth or throat as needed for throat irritation / pain. 177 mL 0  . pravastatin (PRAVACHOL) 40 MG tablet Take 40 mg by mouth daily.    . promethazine (PHENERGAN) 25 MG suppository Place 1 suppository (25 mg total) rectally every 8 (eight)  hours as needed for nausea or vomiting. 12 each 0  . sucralfate (CARAFATE) 1 GM/10ML suspension Take 10 mLs (1 g total) by mouth 4 (four) times daily -  with meals and at bedtime. 420 mL 0  . traZODone (DESYREL) 100 MG tablet Take 100 mg by mouth at bedtime.    Marland Kitchen acidophilus (RISAQUAD) CAPS capsule Take 1 capsule by mouth daily.    Marland Kitchen oxycodone (ROXICODONE) 30 MG immediate release tablet Take 1 tablet orally 7 times daily 60 tablet 0   No current facility-administered medications for this visit.    OBJECTIVE: PHYSICAL EXAM: Gen. status: Patient is seen lean and cachectic. HEENT: No soreness in the mouth.  But swelling which is diffuse Lymphatic system: Supraclavicular, cervical, axillary, inguinal lymph nodes are not palpable Lungs: Bilateral rhonchi and occasional crepitation. Cardiac: Tachycardia Examination of the skin revealed  no evidence of significant rashes, suspicious appearing nevi or other concerning lesions.. Abdominal exam revealed normal bowel sounds. The abdomen was soft, non-tender, and without masses, organomegaly, or appreciable enlargement of the abdominal aorta..   Peg  placement Psychiatric system: Depression and anxiety  Filed Vitals:   08/13/15 1128  BP: 112/76  Pulse: 80  Temp: 96.3 F (35.7 C)     Body mass index is 16.97 kg/(m^2).    ECOG FS:1 - Symptomatic but completely ambulatory  LAB RESULTS:  No visits with results within 2 Day(s) from this visit. Latest known visit with results is:  Admission on 08/03/2015, Discharged on 08/09/2015  Component Date Value Ref Range Status  . Sodium 08/03/2015 130* 135 - 145 mmol/L Final  . Potassium 08/03/2015 3.4* 3.5 - 5.1 mmol/L Final  . Chloride 08/03/2015 94* 101 - 111 mmol/L Final  . CO2 08/03/2015 27  22 - 32 mmol/L Final  . Glucose, Bld 08/03/2015 89  65 - 99 mg/dL Final  . BUN 08/03/2015 21* 6 - 20 mg/dL Final  . Creatinine, Ser 08/03/2015 0.67  0.61 - 1.24 mg/dL Final  . Calcium 08/03/2015 8.4* 8.9 -  10.3 mg/dL Final  . Total Protein 08/03/2015 6.3* 6.5 - 8.1 g/dL Final  . Albumin 08/03/2015 2.9* 3.5 - 5.0 g/dL Final  . AST 08/03/2015 21  15 - 41 U/L Final  . ALT 08/03/2015 12* 17 - 63 U/L Final  . Alkaline Phosphatase 08/03/2015 76  38 - 126 U/L Final  . Total Bilirubin 08/03/2015 1.0  0.3 - 1.2 mg/dL Final  . GFR calc non Af Amer 08/03/2015 >60  >60 mL/min Final  . GFR calc Af Amer 08/03/2015 >60  >60 mL/min Final   Comment: (NOTE) The eGFR has been calculated using the CKD EPI equation. This calculation has not been validated in all clinical situations. eGFR's persistently <60 mL/min signify possible Chronic Kidney Disease.   . Anion gap 08/03/2015 9  5 - 15 Final  . WBC 08/03/2015 10.6  3.8 - 10.6 K/uL Final  . RBC 08/03/2015 3.15* 4.40 - 5.90 MIL/uL Final  . Hemoglobin 08/03/2015 10.5* 13.0 - 18.0 g/dL Final  . HCT 08/03/2015 30.2* 40.0 - 52.0 % Final  . MCV 08/03/2015 95.9  80.0 - 100.0 fL Final  . MCH 08/03/2015 33.2  26.0 - 34.0 pg Final  . MCHC 08/03/2015 34.7  32.0 - 36.0 g/dL Final  . RDW 08/03/2015 13.1  11.5 - 14.5 % Final  . Platelets 08/03/2015 204  150 - 440 K/uL Final  . Neutrophils Relative % 08/03/2015 79   Final  . Neutro Abs 08/03/2015 8.4* 1.4 - 6.5 K/uL Final  . Lymphocytes Relative 08/03/2015 13   Final  . Lymphs Abs 08/03/2015 1.4  1.0 - 3.6 K/uL Final  . Monocytes Relative 08/03/2015 7   Final  . Monocytes Absolute 08/03/2015 0.7  0.2 - 1.0 K/uL Final  . Eosinophils Relative 08/03/2015 1   Final  . Eosinophils Absolute 08/03/2015 0.1  0 - 0.7 K/uL Final  . Basophils Relative 08/03/2015 0   Final  . Basophils Absolute 08/03/2015 0.0  0 - 0.1 K/uL Final  . Color, Urine 08/03/2015 YELLOW* YELLOW Final  . APPearance 08/03/2015 HAZY* CLEAR Final  . Glucose, UA 08/03/2015 NEGATIVE  NEGATIVE mg/dL Final  . Bilirubin Urine 08/03/2015 NEGATIVE  NEGATIVE Final  . Ketones, ur 08/03/2015 2+* NEGATIVE mg/dL Final  . Specific Gravity, Urine 08/03/2015 1.020   1.005 - 1.030 Final  . Hgb urine  dipstick 08/03/2015 NEGATIVE  NEGATIVE Final  . pH 08/03/2015 7.0  5.0 - 8.0 Final  . Protein, ur 08/03/2015 30* NEGATIVE mg/dL Final  . Nitrite 08/03/2015 NEGATIVE  NEGATIVE Final  . Leukocytes, UA 08/03/2015 3+* NEGATIVE Final  . RBC / HPF 08/03/2015 0-5  0 - 5 RBC/hpf Final  . WBC, UA 08/03/2015 6-30  0 - 5 WBC/hpf Final  . Bacteria, UA 08/03/2015 NONE SEEN  NONE SEEN Final  . Squamous Epithelial / LPF 08/03/2015 0-5* NONE SEEN Final  . Mucous 08/03/2015 PRESENT   Final  . Lipase 08/03/2015 126* 22 - 51 U/L Final  . Sodium 08/04/2015 135  135 - 145 mmol/L Final  . Potassium 08/04/2015 3.7  3.5 - 5.1 mmol/L Final  . Chloride 08/04/2015 103  101 - 111 mmol/L Final  . CO2 08/04/2015 25  22 - 32 mmol/L Final  . Glucose, Bld 08/04/2015 74  65 - 99 mg/dL Final  . BUN 08/04/2015 15  6 - 20 mg/dL Final  . Creatinine, Ser 08/04/2015 0.57* 0.61 - 1.24 mg/dL Final  . Calcium 08/04/2015 8.4* 8.9 - 10.3 mg/dL Final  . GFR calc non Af Amer 08/04/2015 >60  >60 mL/min Final  . GFR calc Af Amer 08/04/2015 >60  >60 mL/min Final   Comment: (NOTE) The eGFR has been calculated using the CKD EPI equation. This calculation has not been validated in all clinical situations. eGFR's persistently <60 mL/min signify possible Chronic Kidney Disease.   . Anion gap 08/04/2015 7  5 - 15 Final  . WBC 08/04/2015 9.3  3.8 - 10.6 K/uL Final  . RBC 08/04/2015 2.87* 4.40 - 5.90 MIL/uL Final  . Hemoglobin 08/04/2015 9.2* 13.0 - 18.0 g/dL Final  . HCT 08/04/2015 27.8* 40.0 - 52.0 % Final  . MCV 08/04/2015 96.9  80.0 - 100.0 fL Final  . MCH 08/04/2015 32.1  26.0 - 34.0 pg Final  . MCHC 08/04/2015 33.2  32.0 - 36.0 g/dL Final  . RDW 08/04/2015 12.6  11.5 - 14.5 % Final  . Platelets 08/04/2015 201  150 - 440 K/uL Final  . Specimen Description 08/03/2015 URINE, CLEAN CATCH   Final  . Special Requests 08/03/2015 Immunocompromised   Final  . Culture 08/03/2015    Final                    Value:60,000 COLONIES/ml PSEUDOMONAS AERUGINOSA 20,000 COLONIES/mL ESCHERICHIA COLI   . Report Status 08/03/2015 08/08/2015 FINAL   Final  . Organism ID, Bacteria 08/03/2015 PSEUDOMONAS AERUGINOSA   Final  . Organism ID, Bacteria 08/03/2015 ESCHERICHIA COLI   Final  . Magnesium 08/05/2015 1.7  1.7 - 2.4 mg/dL Final  . Phosphorus 08/05/2015 2.6  2.5 - 4.6 mg/dL Final  . WBC 08/07/2015 5.3  3.8 - 10.6 K/uL Final  . RBC 08/07/2015 3.07* 4.40 - 5.90 MIL/uL Final  . Hemoglobin 08/07/2015 9.8* 13.0 - 18.0 g/dL Final  . HCT 08/07/2015 29.6* 40.0 - 52.0 % Final  . MCV 08/07/2015 96.1  80.0 - 100.0 fL Final  . MCH 08/07/2015 31.9  26.0 - 34.0 pg Final  . MCHC 08/07/2015 33.1  32.0 - 36.0 g/dL Final  . RDW 08/07/2015 12.8  11.5 - 14.5 % Final  . Platelets 08/07/2015 251  150 - 440 K/uL Final  . Sodium 08/08/2015 132* 135 - 145 mmol/L Final  . Potassium 08/08/2015 4.0  3.5 - 5.1 mmol/L Final  . Chloride 08/08/2015 102  101 - 111 mmol/L Final  .  CO2 08/08/2015 19* 22 - 32 mmol/L Final  . Glucose, Bld 08/08/2015 68  65 - 99 mg/dL Final  . BUN 08/08/2015 <5* 6 - 20 mg/dL Final  . Creatinine, Ser 08/08/2015 0.60* 0.61 - 1.24 mg/dL Final  . Calcium 08/08/2015 8.3* 8.9 - 10.3 mg/dL Final  . GFR calc non Af Amer 08/08/2015 >60  >60 mL/min Final  . GFR calc Af Amer 08/08/2015 >60  >60 mL/min Final   Comment: (NOTE) The eGFR has been calculated using the CKD EPI equation. This calculation has not been validated in all clinical situations. eGFR's persistently <60 mL/min signify possible Chronic Kidney Disease.   . Anion gap 08/08/2015 11  5 - 15 Final  . H. Pylogi, Iga Abs 08/08/2015 <9.0  0.0 - 8.9 units Final   Comment: (NOTE)                                Negative          <9.0                                Equivocal   9.0 - 11.0                                Positive         >11.0   . H. Pylogi, Igm Abs 08/08/2015 <9.0  0.0 - 8.9 units Final   Comment: (NOTE)                                 Negative          <9.0                                Equivocal   9.0 - 11.0                                Positive         >11.0 This test was developed and its performance characteristics determined by [facility]. It has not been cleared or approved by the Food and Drug Administration. Performed At: [facility] [place], [place] [id number] Lindon Romp MD FG:[id number]   . H Pylori IgG 08/08/2015 <0.9  0.0 - 0.8 U/mL Final   Comment: (NOTE)                             Negative            <0.9                             Indeterminate  0.9 - 1.0                             Positive            >1.0        ASSESSMENT:  A. 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  Carcinoma of tongue in 2006 status post resection and radiation therapy exit staging not known and old records being off pain for review 2.  Difficulty in swallowing 2-3 weeks duration.  Had a normal upper endoscopy in December of 2015 with esophageal dilated Patient history of peptic ulcer disease 3, significant weight loss 4, previous history of chronic drug abuse.  Chronic tobacco abuse.    PLAN:   Patient had a PEG tube placement Continues to lose weight Consult dietitian to see if calorie skin be increased to more than 2000 2.  Depression HEENT there is no improvement may need antidepressant medication 3.  Recurrent carcinoma of tongue versus second primary.  Patient would start cis-platinum and radiation therapy Cis-platinum 30 mg/m 3 weeks one week off for total 6 cycles. ADDCelexa.  20 mg daily Continue chemotherapy with cis-platinum Patient will continue to get interim hydration Consult with dietitian pending Port placement pending tomorrow     No matching staging information was found for the patient.  Forest Gleason,  MD   08/20/2015 9:57 AM

## 2015-08-20 NOTE — Progress Notes (Signed)
Patient states he started having n/v and diarrhea 15 minutes after putting Jevity in his feeding tube.  He has continued to have these symptoms through out the weekend and today.  Wants to discuss having feeding tube removed.  States it was placed due to risk of aspiration but is now eating and drinking okay and wants it out - this week if possible.  States he has a sore area at tube insertion.

## 2015-08-20 NOTE — Telephone Encounter (Signed)
Patietn called to report that he has N/V/D all weekend, he can't keep anything down. Appt for 1:00 lab, 1:30 MD, 2:00 IVF agreed on by patient. He did not want to come this morning because he wanted time to bathe

## 2015-08-20 NOTE — Progress Notes (Signed)
[place]  [phone]  [phone]     Grant Lee DOB: 03/23/1956  MR#: [id number]  WSF#:[id number]  Patient Care Team: Cyndi Bender, PA-C as PCP - General (Physician Assistant) Beverly Gust, MD (Unknown Physician Specialty)  CHIEF COMPLAINT:  Chief Complaint  Patient presents with  . 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 Deemed unresectable, started on chemotherapy and radiation therapy in September 2016.  INTERVAL HISTORY:  Patient is here as an acute add on regarding diarrhea, vomiting, and to discuss removal of PEG tube. Patient states that he is now able to eat and drink orally and is demanding to have PEG tube removed due to discomfort. He states that when he uses PEG tube for Jevity feeding he develops diarrhea within 15-30 minutes as well as nausea and vomiting. He is very adamant that he insists that the PEG tube be removed. Most recently was in the hospital with gastric ulcers and stomatitis.  REVIEW OF SYSTEMS:   Review of Systems  Constitutional: Positive for weight loss. Negative for fever, chills, malaise/fatigue and diaphoresis.  HENT: Negative for congestion, ear discharge, ear pain, hearing loss, nosebleeds, sore throat and tinnitus.   Eyes: Negative for blurred vision, double vision, photophobia, pain, discharge and redness.  Respiratory: Negative for cough, hemoptysis, sputum production, shortness of breath, wheezing and stridor.   Cardiovascular: Negative for chest pain, palpitations, orthopnea, claudication, leg swelling and PND.  Gastrointestinal: Positive for heartburn, nausea, vomiting, abdominal pain and diarrhea. Negative for constipation, blood in stool and melena.  Genitourinary: Negative.   Musculoskeletal: Negative.   Skin: Negative.   Neurological: Negative for dizziness, tingling, focal weakness, seizures, weakness and headaches.    Endo/Heme/Allergies: Does not bruise/bleed easily.  Psychiatric/Behavioral: Negative for depression. The patient is not nervous/anxious and does not have insomnia.     As per HPI. Otherwise, a complete review of systems is negatve.  ONCOLOGY HISTORY: 1. Has a history of cancer of the tongue in 2006. Patient underwent resection followed by radiation therapy 2. Increasing difficulty in swallowing for last 2 or 3 weeks. Patient had upper endoscopy done in December of 2015. 3. Significant weight loss 4. Abnormal PET scan (August of 2016) 5. Recurrent versus second primary base of the tongue on the left side (unresectable) 6.  Started on chemoradiation therapy. (Cis-platinum and radiation therapy) (September 2016) T3 N0 M0 tumor 7.  Status post PEG tube placement (September, 2016)  PAST MEDICAL HISTORY: Past Medical History  Diagnosis Date  . Hypertension   . Pneumonia     2004  . GERD (gastroesophageal reflux disease)   . Arthritis   . Anemia     blood transfusion 2012  . Bleeding ulcer   . Machinery accident     LEG INJURY  . Cancer [facility])     mouth 2006 then again 2016    PAST SURGICAL HISTORY: Past Surgical History  Procedure Laterality Date  . Leg surgery    . Joint replacement      right hip  . Tongue biopsy    . Tongue surgery      multiple surgeries  . Direct laryngoscopy N/A 06/26/2015    Procedure: Laryngoscopy with tongue base biopsy ;  Surgeon: Beverly Gust, MD;  Location: [facility] ORS;  Service: ENT;  Laterality: N/A;  . Esophagogastroduodenoscopy N/A 07/06/2015    Procedure: ESOPHAGOGASTRODUODENOSCOPY (EGD) and percutaneous gastrostomy tyube placement;  Surgeon: Lollie Sails, MD;  Location: [facility] ENDOSCOPY;  Service: Endoscopy;  Laterality: N/A;  Residual need to be done in the afternoon on 07/06/2015. Combined procedure with Dr. Gustavo Lah and Dr Rayann Heman  . Peripheral vascular catheterization N/A 07/27/2015    Procedure: Glori Luis Cath Insertion;  Surgeon:  Katha Cabal, MD;  Location: [place] [facility];  Service: Cardiovascular;  Laterality: N/A;  . Esophagogastroduodenoscopy (egd) with propofol N/A 08/07/2015    Procedure: ESOPHAGOGASTRODUODENOSCOPY (EGD) WITH PROPOFOL;  Surgeon: Lollie Sails, MD;  Location: [facility] ENDOSCOPY;  Service: Endoscopy;  Laterality: N/A;    FAMILY HISTORY Family History  Problem Relation Age of Onset  . CAD Mother   . CAD Father     GYNECOLOGIC HISTORY:  No LMP for male patient.     ADVANCED DIRECTIVES:    HEALTH MAINTENANCE: Social History  Substance Use Topics  . Smoking status: Former Smoker -- 1.00 packs/day for 0 years    Types: Cigarettes    Quit date: 09/21/2014  . Smokeless tobacco: Not on file  . Alcohol Use: 7.2 oz/week    12 Cans of beer per week     Comment: beer/wine every day     Colonoscopy:  PAP:  Bone density:  Lipid panel:  No Known Allergies  Current Outpatient Prescriptions  Medication Sig Dispense Refill  . acidophilus (RISAQUAD) CAPS capsule Take 1 capsule by mouth daily.    . chlorhexidine (PERIDEX) 0.12 % solution 15 mLs by Mouth Rinse route 2 (two) times daily. 120 mL 0  . citalopram (CELEXA) 20 MG tablet Take 1 tablet (20 mg total) by mouth daily. 30 tablet 3  . fentaNYL (DURAGESIC - DOSED MCG/HR) 25 MCG/HR patch Place 2 patches (50 mcg total) onto the skin every 3 (three) days. 10 patch 0  . lidocaine-prilocaine (EMLA) cream Apply 1 application topically as needed. (Patient taking differently: Apply 1 application topically as needed (prior to accessing port). ) 30 g 3  . lisinopril (PRINIVIL,ZESTRIL) 10 MG tablet Take 10 mg by mouth daily.    . ondansetron (ZOFRAN) 4 MG tablet Take 1 tablet (4 mg total) by mouth every 4 (four) hours as needed for nausea or vomiting. 45 tablet 0  . oxycodone (ROXICODONE) 30 MG immediate release tablet Take 1 tablet orally 7 times daily 60 tablet 0  . pantoprazole sodium (PROTONIX) 40 mg/20 mL PACK Place 20 mLs (40 mg total)  into feeding tube 2 (two) times daily. 30 each 0  . phenol (CHLORASEPTIC) 1.4 % LIQD Use as directed 1 spray in the mouth or throat as needed for throat irritation / pain. 177 mL 0  . pravastatin (PRAVACHOL) 40 MG tablet Take 40 mg by mouth daily.    . promethazine (PHENERGAN) 25 MG suppository Place 1 suppository (25 mg total) rectally every 8 (eight) hours as needed for nausea or vomiting. 12 each 0  . sucralfate (CARAFATE) 1 GM/10ML suspension Take 10 mLs (1 g total) by mouth 4 (four) times daily -  with meals and at bedtime. 420 mL 0  . traZODone (DESYREL) 100 MG tablet Take 100 mg by mouth at bedtime.     Current Facility-Administered Medications  Medication Dose Route Frequency Provider Last Rate Last Dose  . famotidine (PEPCID) IVPB 20 mg premix  20 mg Intravenous Q12H Evlyn Kanner, NP   20 mg at 08/20/15 1446   Facility-Administered Medications Ordered in Other Visits  Medication Dose Route Frequency Provider Last Rate Last Dose  . heparin lock flush 100 unit/mL  250 Units Intracatheter  Once PRN Evlyn Kanner, NP      . sodium chloride 0.9 % injection 10 mL  10 mL Intracatheter PRN Evlyn Kanner, NP   10 mL at 08/20/15 1519    OBJECTIVE: BP 114/79 mmHg  Pulse 87  Temp(Src) 97.3 F (36.3 C) (Tympanic)  Wt 133 lb 8 oz (60.555 kg)   Body mass index is 17.13 kg/(m^2).    ECOG FS:1 - Symptomatic but completely ambulatory  General: Well-developed, well-nourished, no acute distress. Eyes: Pink conjunctiva, anicteric sclera. HEENT: Normocephalic, moist mucous membranes, clear oropharnyx. Lungs: Clear to auscultation bilaterally. Heart: Regular rate and rhythm. No rubs, murmurs, or gallops. Abdomen: Soft, nontender, nondistended. PEG tube in place, covered with dressing. No organomegaly noted, normoactive bowel sounds. Musculoskeletal: No edema, cyanosis, or clubbing. Neuro: Alert, answering all questions appropriately. Cranial nerves grossly intact. Skin: No rashes or  petechiae noted. Psych: Anxious   LAB RESULTS:  Infusion on 08/20/2015  Component Date Value Ref Range Status  . WBC 08/20/2015 5.6  3.8 - 10.6 K/uL Final   A-LINE DRAW  . RBC 08/20/2015 3.37* 4.40 - 5.90 MIL/uL Final  . Hemoglobin 08/20/2015 10.6* 13.0 - 18.0 g/dL Final  . HCT 08/20/2015 31.6* 40.0 - 52.0 % Final  . MCV 08/20/2015 93.7  80.0 - 100.0 fL Final  . MCH 08/20/2015 31.4  26.0 - 34.0 pg Final  . MCHC 08/20/2015 33.5  32.0 - 36.0 g/dL Final  . RDW 08/20/2015 14.0  11.5 - 14.5 % Final  . Platelets 08/20/2015 360  150 - 440 K/uL Final  . Neutrophils Relative % 08/20/2015 64   Final  . Neutro Abs 08/20/2015 3.6  1.4 - 6.5 K/uL Final  . Lymphocytes Relative 08/20/2015 26   Final  . Lymphs Abs 08/20/2015 1.5  1.0 - 3.6 K/uL Final  . Monocytes Relative 08/20/2015 10   Final  . Monocytes Absolute 08/20/2015 0.5  0.2 - 1.0 K/uL Final  . Eosinophils Relative 08/20/2015 0   Final  . Eosinophils Absolute 08/20/2015 0.0  0 - 0.7 K/uL Final  . Basophils Relative 08/20/2015 0   Final  . Basophils Absolute 08/20/2015 0.0  0 - 0.1 K/uL Final  . Sodium 08/20/2015 136  135 - 145 mmol/L Final  . Potassium 08/20/2015 3.4* 3.5 - 5.1 mmol/L Final  . Chloride 08/20/2015 103  101 - 111 mmol/L Final  . CO2 08/20/2015 24  22 - 32 mmol/L Final  . Glucose, Bld 08/20/2015 87  65 - 99 mg/dL Final  . BUN 08/20/2015 11  6 - 20 mg/dL Final  . Creatinine, Ser 08/20/2015 0.62  0.61 - 1.24 mg/dL Final  . Calcium 08/20/2015 8.1* 8.9 - 10.3 mg/dL Final  . Total Protein 08/20/2015 5.6* 6.5 - 8.1 g/dL Final  . Albumin 08/20/2015 2.4* 3.5 - 5.0 g/dL Final  . AST 08/20/2015 17  15 - 41 U/L Final  . ALT 08/20/2015 9* 17 - 63 U/L Final  . Alkaline Phosphatase 08/20/2015 61  38 - 126 U/L Final  . Total Bilirubin 08/20/2015 0.6  0.3 - 1.2 mg/dL Final  . GFR calc non Af Amer 08/20/2015 >60  >60 mL/min Final  . GFR calc Af Amer 08/20/2015 >60  >60 mL/min Final   Comment: (NOTE) The eGFR has been calculated  using the CKD EPI equation. This calculation has not been validated in all clinical situations. eGFR's persistently <60 mL/min signify possible Chronic Kidney Disease.   . Anion gap 08/20/2015 9  5 - 15 Final  .  Magnesium 08/20/2015 1.6* 1.7 - 2.4 mg/dL Final    STUDIES: No results found.  ASSESSMENT:  Carcinoma of tongue. Dehydration related to nausea and vomiting as well as diarrhea.  PLAN:   1. Carcinoma of tongue. Patient is currently on daily radiation with concurrent cisplatin 30 mg/m. His radiation has been delayed as well as his chemotherapy. His last dose of cisplatin was on September 26. 2. Dehydration related to nausea and vomiting as well as diarrhea. Patient reports that diarrhea and nausea with vomiting developed following Jevity through PEG tube. He is now distraught and angry and wants PEG tube removed. Advised patient that he would have to discuss this with primary oncologist as it is likely not the best idea when his treatment cycles have not been completed. Will administer IV fluids today with dexamethasone as well as Zofran. Advised patient to keep all ready scheduled follow-up with Dr. Oliva Bustard on Wednesday.  Patient expressed understanding and was in agreement with this plan. He also understands that He can call clinic at any time with any questions, concerns, or complaints.   Dr. Oliva Bustard was available for consultation and review of plan of care for this patient.   Evlyn Kanner, NP   08/20/2015 4:26 PM

## 2015-08-21 ENCOUNTER — Inpatient Hospital Stay: Payer: Medicare Other

## 2015-08-21 ENCOUNTER — Ambulatory Visit: Payer: Medicare Other

## 2015-08-21 ENCOUNTER — Telehealth: Payer: Self-pay | Admitting: *Deleted

## 2015-08-21 VITALS — BP 102/72 | HR 78 | Temp 97.2°F

## 2015-08-21 DIAGNOSIS — R112 Nausea with vomiting, unspecified: Secondary | ICD-10-CM | POA: Diagnosis not present

## 2015-08-21 DIAGNOSIS — K219 Gastro-esophageal reflux disease without esophagitis: Secondary | ICD-10-CM | POA: Diagnosis not present

## 2015-08-21 DIAGNOSIS — E860 Dehydration: Secondary | ICD-10-CM | POA: Diagnosis not present

## 2015-08-21 DIAGNOSIS — Z85810 Personal history of malignant neoplasm of tongue: Secondary | ICD-10-CM | POA: Diagnosis not present

## 2015-08-21 DIAGNOSIS — R197 Diarrhea, unspecified: Secondary | ICD-10-CM | POA: Diagnosis not present

## 2015-08-21 DIAGNOSIS — Z79899 Other long term (current) drug therapy: Secondary | ICD-10-CM | POA: Diagnosis not present

## 2015-08-21 DIAGNOSIS — Z5111 Encounter for antineoplastic chemotherapy: Secondary | ICD-10-CM | POA: Diagnosis not present

## 2015-08-21 DIAGNOSIS — Z931 Gastrostomy status: Secondary | ICD-10-CM | POA: Diagnosis not present

## 2015-08-21 DIAGNOSIS — K259 Gastric ulcer, unspecified as acute or chronic, without hemorrhage or perforation: Secondary | ICD-10-CM | POA: Diagnosis not present

## 2015-08-21 DIAGNOSIS — Z87891 Personal history of nicotine dependence: Secondary | ICD-10-CM | POA: Diagnosis not present

## 2015-08-21 DIAGNOSIS — C029 Malignant neoplasm of tongue, unspecified: Secondary | ICD-10-CM

## 2015-08-21 DIAGNOSIS — I10 Essential (primary) hypertension: Secondary | ICD-10-CM | POA: Diagnosis not present

## 2015-08-21 DIAGNOSIS — R634 Abnormal weight loss: Secondary | ICD-10-CM | POA: Diagnosis not present

## 2015-08-21 DIAGNOSIS — E7800 Pure hypercholesterolemia, unspecified: Secondary | ICD-10-CM | POA: Diagnosis not present

## 2015-08-21 DIAGNOSIS — R1310 Dysphagia, unspecified: Secondary | ICD-10-CM | POA: Diagnosis not present

## 2015-08-21 MED ORDER — SODIUM CHLORIDE 0.9 % IJ SOLN
10.0000 mL | INTRAMUSCULAR | Status: DC | PRN
  Administered 2015-08-21: 10 mL
  Filled 2015-08-21: qty 10

## 2015-08-21 MED ORDER — SODIUM CHLORIDE 0.9 % IV SOLN
Freq: Once | INTRAVENOUS | Status: AC
  Administered 2015-08-21: 14:00:00 via INTRAVENOUS
  Filled 2015-08-21: qty 1000

## 2015-08-21 MED ORDER — HEPARIN SOD (PORK) LOCK FLUSH 100 UNIT/ML IV SOLN
INTRAVENOUS | Status: AC
  Filled 2015-08-21: qty 5

## 2015-08-21 MED ORDER — HEPARIN SOD (PORK) LOCK FLUSH 100 UNIT/ML IV SOLN
500.0000 [IU] | Freq: Once | INTRAVENOUS | Status: AC | PRN
  Administered 2015-08-21: 500 [IU]

## 2015-08-21 MED ORDER — SODIUM CHLORIDE 0.9 % IV SOLN
Freq: Once | INTRAVENOUS | Status: AC
  Administered 2015-08-21: 14:00:00 via INTRAVENOUS
  Filled 2015-08-21: qty 2

## 2015-08-21 NOTE — Telephone Encounter (Signed)
Asking if he can come in for IVF again today. After checking with infusion charge nurse and L Herring, AGNP-C, he was given an appt for 200 and he agrees to come at this time

## 2015-08-22 ENCOUNTER — Ambulatory Visit
Admission: RE | Admit: 2015-08-22 | Discharge: 2015-08-22 | Disposition: A | Discharge status: Home / Self Care | Payer: Medicare Other | Source: Ambulatory Visit | Attending: Radiation Oncology | Admitting: Radiation Oncology

## 2015-08-22 ENCOUNTER — Inpatient Hospital Stay: Payer: Medicare Other

## 2015-08-22 ENCOUNTER — Inpatient Hospital Stay (HOSPITAL_BASED_OUTPATIENT_CLINIC_OR_DEPARTMENT_OTHER): Payer: Medicare Other | Admitting: Oncology

## 2015-08-22 VITALS — BP 134/81 | HR 61 | Temp 96.0°F | Resp 18

## 2015-08-22 VITALS — BP 107/71 | HR 91 | Temp 96.5°F | Wt 136.2 lb

## 2015-08-22 DIAGNOSIS — K219 Gastro-esophageal reflux disease without esophagitis: Secondary | ICD-10-CM | POA: Diagnosis not present

## 2015-08-22 DIAGNOSIS — C029 Malignant neoplasm of tongue, unspecified: Secondary | ICD-10-CM

## 2015-08-22 DIAGNOSIS — R197 Diarrhea, unspecified: Secondary | ICD-10-CM | POA: Diagnosis not present

## 2015-08-22 DIAGNOSIS — Z87891 Personal history of nicotine dependence: Secondary | ICD-10-CM | POA: Diagnosis not present

## 2015-08-22 DIAGNOSIS — Z931 Gastrostomy status: Secondary | ICD-10-CM | POA: Diagnosis not present

## 2015-08-22 DIAGNOSIS — K259 Gastric ulcer, unspecified as acute or chronic, without hemorrhage or perforation: Secondary | ICD-10-CM | POA: Diagnosis not present

## 2015-08-22 DIAGNOSIS — E860 Dehydration: Secondary | ICD-10-CM | POA: Diagnosis not present

## 2015-08-22 DIAGNOSIS — Z79899 Other long term (current) drug therapy: Secondary | ICD-10-CM

## 2015-08-22 DIAGNOSIS — R1310 Dysphagia, unspecified: Secondary | ICD-10-CM | POA: Diagnosis not present

## 2015-08-22 DIAGNOSIS — R112 Nausea with vomiting, unspecified: Secondary | ICD-10-CM

## 2015-08-22 DIAGNOSIS — Z85810 Personal history of malignant neoplasm of tongue: Secondary | ICD-10-CM | POA: Diagnosis not present

## 2015-08-22 DIAGNOSIS — I10 Essential (primary) hypertension: Secondary | ICD-10-CM | POA: Diagnosis not present

## 2015-08-22 DIAGNOSIS — R634 Abnormal weight loss: Secondary | ICD-10-CM | POA: Diagnosis not present

## 2015-08-22 DIAGNOSIS — Z5111 Encounter for antineoplastic chemotherapy: Secondary | ICD-10-CM | POA: Diagnosis not present

## 2015-08-22 DIAGNOSIS — Z510 Encounter for antineoplastic radiation therapy: Secondary | ICD-10-CM | POA: Diagnosis not present

## 2015-08-22 DIAGNOSIS — E7800 Pure hypercholesterolemia, unspecified: Secondary | ICD-10-CM | POA: Diagnosis not present

## 2015-08-22 DIAGNOSIS — C01 Malignant neoplasm of base of tongue: Secondary | ICD-10-CM | POA: Diagnosis not present

## 2015-08-22 LAB — CBC WITH DIFFERENTIAL/PLATELET
BASOS ABS: 0 10*3/uL (ref 0–0.1)
BASOS PCT: 1 %
EOS ABS: 0 10*3/uL (ref 0–0.7)
EOS PCT: 0 %
HCT: 30.3 % — ABNORMAL LOW (ref 40.0–52.0)
HEMOGLOBIN: 10.3 g/dL — AB (ref 13.0–18.0)
LYMPHS ABS: 2.8 10*3/uL (ref 1.0–3.6)
Lymphocytes Relative: 36 %
MCH: 31.7 pg (ref 26.0–34.0)
MCHC: 34.1 g/dL (ref 32.0–36.0)
MCV: 93.1 fL (ref 80.0–100.0)
Monocytes Absolute: 0.7 10*3/uL (ref 0.2–1.0)
Monocytes Relative: 8 %
NEUTROS PCT: 55 %
Neutro Abs: 4.2 10*3/uL (ref 1.4–6.5)
PLATELETS: 389 10*3/uL (ref 150–440)
RBC: 3.25 MIL/uL — AB (ref 4.40–5.90)
RDW: 14.3 % (ref 11.5–14.5)
WBC: 7.8 10*3/uL (ref 3.8–10.6)

## 2015-08-22 LAB — COMPREHENSIVE METABOLIC PANEL
ALBUMIN: 2.4 g/dL — AB (ref 3.5–5.0)
ALT: 9 U/L — AB (ref 17–63)
AST: 16 U/L (ref 15–41)
Alkaline Phosphatase: 52 U/L (ref 38–126)
Anion gap: 8 (ref 5–15)
BUN: 10 mg/dL (ref 6–20)
CHLORIDE: 102 mmol/L (ref 101–111)
CO2: 23 mmol/L (ref 22–32)
CREATININE: 0.61 mg/dL (ref 0.61–1.24)
Calcium: 7.9 mg/dL — ABNORMAL LOW (ref 8.9–10.3)
GFR calc non Af Amer: >60 mL/min (ref >60)
GLUCOSE: 102 mg/dL — AB (ref 65–99)
Potassium: 3 mmol/L — ABNORMAL LOW (ref 3.5–5.1)
SODIUM: 133 mmol/L — AB (ref 135–145)
Total Bilirubin: 0.6 mg/dL (ref 0.3–1.2)
Total Protein: 5.5 g/dL — ABNORMAL LOW (ref 6.5–8.1)

## 2015-08-22 LAB — MAGNESIUM: Magnesium: 1.6 mg/dL — ABNORMAL LOW (ref 1.7–2.4)

## 2015-08-22 MED ORDER — PANTOPRAZOLE SODIUM 40 MG IV SOLR
40.0000 mg | Freq: Once | INTRAVENOUS | Status: AC
  Administered 2015-08-22: 40 mg via INTRAVENOUS
  Filled 2015-08-22: qty 40

## 2015-08-22 MED ORDER — HYDROCORTISONE NA SUCCINATE PF 100 MG IJ SOLR
200.0000 mg | Freq: Once | INTRAMUSCULAR | Status: AC
  Administered 2015-08-22: 200 mg via INTRAVENOUS

## 2015-08-22 MED ORDER — DIPHENHYDRAMINE HCL 50 MG/ML IJ SOLN
50.0000 mg | Freq: Once | INTRAMUSCULAR | Status: AC
  Administered 2015-08-22: 50 mg via INTRAVENOUS
  Filled 2015-08-22: qty 1

## 2015-08-22 MED ORDER — DIPHENHYDRAMINE HCL 50 MG/ML IJ SOLN
25.0000 mg | Freq: Once | INTRAMUSCULAR | Status: AC
Start: 2015-08-22 — End: 2015-08-22
  Administered 2015-08-22: 25 mg via INTRAVENOUS

## 2015-08-22 MED ORDER — MAGNESIUM SULFATE 2 GM/50ML IV SOLN: 2.0000 g | Freq: Once | INTRAVENOUS | Status: DC

## 2015-08-22 MED ORDER — SODIUM CHLORIDE 0.9 % IV SOLN
4.0000 mg | Freq: Once | INTRAVENOUS | Status: AC
  Administered 2015-08-22: 4 mg via INTRAVENOUS
  Filled 2015-08-22: qty 0.4

## 2015-08-22 MED ORDER — SODIUM CHLORIDE 0.9 % IV SOLN
INTRAVENOUS | Status: DC
  Administered 2015-08-22: 12:00:00 via INTRAVENOUS
  Filled 2015-08-22: qty 250

## 2015-08-22 MED ORDER — SODIUM CHLORIDE 0.9 % IJ SOLN
10.0000 mL | INTRAMUSCULAR | Status: DC | PRN
  Administered 2015-08-22: 10 mL via INTRAVENOUS
  Filled 2015-08-22: qty 10

## 2015-08-22 MED ORDER — SODIUM CHLORIDE 0.9 % IV SOLN
Freq: Once | INTRAVENOUS | Status: AC
Start: 2015-08-22 — End: 2015-08-22
  Administered 2015-08-22: 12:00:00 via INTRAVENOUS
  Filled 2015-08-22: qty 1000

## 2015-08-22 MED ORDER — CETUXIMAB CHEMO IV INJECTION 200 MG/100ML
400.0000 mg/m2 | Freq: Once | INTRAVENOUS | Status: AC
  Administered 2015-08-22: 700 mg via INTRAVENOUS
  Filled 2015-08-22: qty 350

## 2015-08-22 MED ORDER — HEPARIN SOD (PORK) LOCK FLUSH 100 UNIT/ML IV SOLN
500.0000 [IU] | Freq: Once | INTRAVENOUS | Status: AC
  Administered 2015-08-22: 500 [IU] via INTRAVENOUS
  Filled 2015-08-22: qty 5

## 2015-08-22 MED ORDER — PANTOPRAZOLE SODIUM 40 MG PO PACK: 40.0000 mg | PACK | Freq: Two times a day (BID) | ORAL | Status: DC

## 2015-08-22 MED ORDER — OXYCODONE HCL 30 MG PO TABS: 30.0000 mg | ORAL_TABLET | ORAL | Status: DC | PRN

## 2015-08-22 MED ORDER — PANTOPRAZOLE SODIUM 40 MG PO TBEC: 40.0000 mg | DELAYED_RELEASE_TABLET | Freq: Two times a day (BID) | ORAL | Status: DC

## 2015-08-22 NOTE — Progress Notes (Signed)
Patient does not have living will.  Former smoker. C/o reflux.  Also has sore throat.

## 2015-08-22 NOTE — Progress Notes (Signed)
1500- Patient states, "My palms and wrists are burning and itching really bad." Upon physical assessment these areas were red as well. Erbitux stopped. Dr. Oliva Bustard paged and present at chairside at 1504. VSS, see flowsheet. Benadryl 25 mg IV given at 1501. Solu-Cortef 200 mg IV given at 1503. Patient states, "The itching and burning is getting much better." Per MD, Dr. Oliva Bustard, order: wait 15 minutes and restart Erbitux at a rate of 120 ml/hr. 1520-Patient states, "My palms and wrists are no longer burning. My palms still have just a slight itch, but not bad." VSS, see flowsheet. MD, Dr. Oliva Bustard, notified via telephone at 1521. Per MD, Dr. Oliva Bustard, order: Erbitux restarted at 1523 at a rate of 120 ml/hr and will stay at this rate for the remainder of the infusion. Will continue to monitor patient.

## 2015-08-23 ENCOUNTER — Ambulatory Visit
Admission: RE | Admit: 2015-08-23 | Discharge: 2015-08-23 | Disposition: A | Discharge status: Home / Self Care | Payer: Medicare Other | Source: Ambulatory Visit | Attending: Radiation Oncology | Admitting: Radiation Oncology

## 2015-08-23 DIAGNOSIS — C029 Malignant neoplasm of tongue, unspecified: Secondary | ICD-10-CM | POA: Diagnosis not present

## 2015-08-23 DIAGNOSIS — Z87891 Personal history of nicotine dependence: Secondary | ICD-10-CM | POA: Diagnosis not present

## 2015-08-23 DIAGNOSIS — Z510 Encounter for antineoplastic radiation therapy: Secondary | ICD-10-CM | POA: Diagnosis not present

## 2015-08-23 DIAGNOSIS — C01 Malignant neoplasm of base of tongue: Secondary | ICD-10-CM | POA: Diagnosis not present

## 2015-08-24 ENCOUNTER — Ambulatory Visit
Admission: RE | Admit: 2015-08-24 | Discharge: 2015-08-24 | Disposition: A | Discharge status: Home / Self Care | Payer: Medicare Other | Source: Ambulatory Visit | Attending: Radiation Oncology | Admitting: Radiation Oncology

## 2015-08-24 DIAGNOSIS — Z87891 Personal history of nicotine dependence: Secondary | ICD-10-CM | POA: Diagnosis not present

## 2015-08-24 DIAGNOSIS — C01 Malignant neoplasm of base of tongue: Secondary | ICD-10-CM | POA: Diagnosis not present

## 2015-08-24 DIAGNOSIS — Z510 Encounter for antineoplastic radiation therapy: Secondary | ICD-10-CM | POA: Diagnosis not present

## 2015-08-24 DIAGNOSIS — C029 Malignant neoplasm of tongue, unspecified: Secondary | ICD-10-CM | POA: Diagnosis not present

## 2015-08-25 ENCOUNTER — Encounter: Payer: Self-pay | Admitting: Oncology

## 2015-08-25 NOTE — Progress Notes (Signed)
[place] @ [facility] [phone]  Fax:([phone]) [place] OB: February 06, 1956  MR#: [id number]  UYQ#:[id number]  Patient Care Team: Cyndi Bender, PA-C as PCP - General (Physician Assistant) Beverly Gust, MD (Unknown Physician Specialty)  CHIEF COMPLAINT:  Chief Complaint  Patient presents with  . OTHER   1.  Has a history of cancer of the tongue in 2006.  Patient underwent resection followed by radiation therapy 2.  Increasing difficulty in swallowing for last 2 or 3 weeks.  Patient had upper endoscopy done in December of 2015. 3.  Significant weight loss 4.  Abnormal PET scan (August of 2016) 5.  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  (Cis-platinum and radiation therapy) (September 2 016) T3 N0 M0 tumor Status post PEG tube placement (September, 2016) 6. Poor tolerance dose cis-platinum therapy, so patient has been switched over to cetuximab along with radiation therapy (October, 2016)  VISIT DIAGNOSIS:  Significant weight loss. Difficulty swallowing. History of carcinoma of tongue    Oncology Flowsheet 08/14/2015 08/15/2015 08/16/2015 08/17/2015 08/20/2015 08/21/2015 08/22/2015  Day, Cycle - - - - - - -  cetuximab (ERBITUX) IV - - - - - - 400 mg/m2  CISplatin (PLATINOL) IV - - - - - - -  dexamethasone (DECADRON) IJ - - - - - - -  dexamethasone (DECADRON) IV - [ 4 mg ] [ 4 mg ] [ 4 mg ] [ 4 mg ] [ 4 mg ] 4 mg  enoxaparin (LOVENOX) [place] - - - - - - -  fosaprepitant (EMEND) IV - - - - - - -  granisetron (KYTRIL) PO - - - - - - -  LORazepam (ATIVAN) IV - - - - - - -  metoCLOPramide (REGLAN) IV 10 mg 10 mg 10 mg 10 mg - - -  ondansetron (ZOFRAN) IV - [ 4 mg ] [ 4 mg ] [ 4 mg ] [ 4 mg ] [ 4 mg ] -  palonosetron (ALOXI) IV - - - - - - -  promethazine (PHENERGAN) IM - - - - - - -  promethazine (PHENERGAN) IV - - - - - - -    INTERVAL HISTORY:  I received  a phone call from ENT surgeon to see this 59 year old gentleman who had been seen by me several years ago.  Patient had a history of carcinoma of lung (exact state not known) old chart being reviewed.  At present old records are not available on EMR. According to patient he had been doing fairly good.  He quit smoking in November.  December had upper endoscopy because of difficulty swallowing.  No abnormality detected patient underwent esophageal dilation. Recently he has increasing difficulty in swallowing but that is in right at the level off larynx and beginning of esophagus. Has not lost significant amount of weight.  Soreness in the mouth.  Patient has been treated with antifungal antibiotics without much relief.  Patient has lost approximately 40-50 pounds of weight. Also has increasing cough.  Yellowish expectoration.  Patient had been a chronic smoker and quit smoking in November of 2015 October, 12th, 2016 Patient is here for further evaluation and treatment consideration.  Patient had multiple gastric ulcer now tolerating food very well.  Intermittent steroid is helping.  IV fluid was given off and on to keep patient well hydrated.  According to patient he just started keeping his diet in.  We will start patient on cetuximab as patient tolerated cisplatin very poorly.Marland Kitchen    REVIEW OF SYSTEMS:   Gen. status: Patient is feeling weak and tired.  Has lost significant weight Poor appetite patient is very depressed.  Losing weight. Increasing difficulty swallowing as mentioned in history of present illness HEENT: As mentioned in history of present illness patient had history of carcinoma of tongue status post radiation therapy recently having increasing soreness in the mouth. Lungs: Increasing cough shortness of breath yellowish expectoration no fever no hemoptysis GI: Persistent nausea as described above in history of present illness [place] close skeletal system no bony pain Lower extremity no  swelling Skin: No rash Abdomen: Had a PEG tube placement  As per HPI. Otherwise, a complete review of systems is negatve.  PAST MEDICAL HISTORY: Carcinoma of tongue Hypertension Hypercholesterolemia Previous substance abuse Gastroesophageal reflux disease  PAST SURGICAL HISTORY: Treated for carcinoma of tongue FAMILY HISTORY There is no significant family history of breast cancer, ovarian cancer, colon cancer    ADVANCED DIRECTIVES:  Patient does not have any living will or healthcare power of attorney.  Information was given .  Available resources had been discussed.  We will follow-up on subsequent appointments regarding this issue  HEALTH MAINTENANCE: Social History  Substance Use Topics  . Smoking status: Former Smoker -- 1.00 packs/day for 0 years    Types: Cigarettes    Quit date: 09/21/2014  . Smokeless tobacco: None  . Alcohol Use: 7.2 oz/week    12 Cans of beer per week     Comment: beer/wine every day    Quit smoking in November of 2015.  History of smoking for several years in the past.  No Known Allergies  Current Outpatient Prescriptions  Medication Sig Dispense Refill  . acidophilus (RISAQUAD) CAPS capsule Take 1 capsule by mouth daily.    . chlorhexidine (PERIDEX) 0.12 % solution 15 mLs by Mouth Rinse route 2 (two) times daily. 120 mL 0  . citalopram (CELEXA) 20 MG tablet Take 1 tablet (20 mg total) by mouth daily. 30 tablet 3  . fentaNYL (DURAGESIC - DOSED MCG/HR) 25 MCG/HR patch Place 2 patches (50 mcg total) onto the skin every 3 (three) days. 10 patch 0  . lidocaine-prilocaine (EMLA) cream Apply 1 application topically as needed. (Patient taking differently: Apply 1 application topically as needed (prior to accessing port). ) 30 g 3  . lisinopril (PRINIVIL,ZESTRIL) 10 MG tablet Take 10 mg by mouth daily.    . ondansetron (ZOFRAN) 4 MG tablet Take 1 tablet (4 mg total) by mouth every 4 (four) hours as needed for nausea or vomiting. 45 tablet 0  .  oxycodone (ROXICODONE) 30 MG immediate release tablet Take 1 tablet (30 mg total) by mouth every 4 (four) hours as needed for pain. Take 1 tablet orally 7 times daily 60 tablet 0  . phenol (CHLORASEPTIC) 1.4 % LIQD Use as directed 1 spray in the mouth or throat as needed for throat irritation / pain. 177 mL 0  . pravastatin (PRAVACHOL) 40 MG tablet Take 40 mg by mouth daily.    . promethazine (PHENERGAN) 25 MG suppository Place 1 suppository (25 mg total) rectally every 8 (eight) hours as needed for nausea or vomiting. 12 each 0  . sucralfate (CARAFATE) 1 GM/10ML suspension Take 10 mLs (1 g total) by mouth 4 (four) times daily -  with meals and at bedtime. 420 mL 0  . traZODone (DESYREL) 100 MG tablet Take 100 mg by mouth  at bedtime.    . pantoprazole (PROTONIX) 40 MG tablet Take 1 tablet (40 mg total) by mouth 2 (two) times daily. 60 tablet 3   No current facility-administered medications for this visit.    OBJECTIVE: PHYSICAL EXAM: Gen. status: Patient is seen lean and cachectic. HEENT: No soreness in the mouth.  But swelling which is diffuse Lymphatic system: Supraclavicular, cervical, axillary, inguinal lymph nodes are not palpable Lungs: Bilateral rhonchi and occasional crepitation. Cardiac: Tachycardia Examination of the skin revealed no evidence of significant rashes, suspicious appearing nevi or other concerning lesions.. Abdominal exam revealed normal bowel sounds. The abdomen was soft, non-tender, and without masses, organomegaly, or appreciable enlargement of the abdominal aorta..   Peg  placement Psychiatric system: Depression and anxiety  Filed Vitals:   08/22/15 1003  BP: 107/71  Pulse: 91  Temp: 96.5 F (35.8 C)     Body mass index is 17.49 kg/(m^2).    ECOG FS:1 - Symptomatic but completely ambulatory  LAB RESULTS:  Infusion on 08/22/2015  Component Date Value Ref Range Status  . WBC 08/22/2015 7.8  3.8 - 10.6 K/uL Final  . RBC 08/22/2015 3.25* 4.40 - 5.90 MIL/uL  Final  . Hemoglobin 08/22/2015 10.3* 13.0 - 18.0 g/dL Final  . HCT 08/22/2015 30.3* 40.0 - 52.0 % Final  . MCV 08/22/2015 93.1  80.0 - 100.0 fL Final  . MCH 08/22/2015 31.7  26.0 - 34.0 pg Final  . MCHC 08/22/2015 34.1  32.0 - 36.0 g/dL Final  . RDW 08/22/2015 14.3  11.5 - 14.5 % Final  . Platelets 08/22/2015 389  150 - 440 K/uL Final  . Neutrophils Relative % 08/22/2015 55   Final  . Neutro Abs 08/22/2015 4.2  1.4 - 6.5 K/uL Final  . Lymphocytes Relative 08/22/2015 36   Final  . Lymphs Abs 08/22/2015 2.8  1.0 - 3.6 K/uL Final  . Monocytes Relative 08/22/2015 8   Final  . Monocytes Absolute 08/22/2015 0.7  0.2 - 1.0 K/uL Final  . Eosinophils Relative 08/22/2015 0   Final  . Eosinophils Absolute 08/22/2015 0.0  0 - 0.7 K/uL Final  . Basophils Relative 08/22/2015 1   Final  . Basophils Absolute 08/22/2015 0.0  0 - 0.1 K/uL Final  . Sodium 08/22/2015 133* 135 - 145 mmol/L Final  . Potassium 08/22/2015 3.0* 3.5 - 5.1 mmol/L Final  . Chloride 08/22/2015 102  101 - 111 mmol/L Final  . CO2 08/22/2015 23  22 - 32 mmol/L Final  . Glucose, Bld 08/22/2015 102* 65 - 99 mg/dL Final  . BUN 08/22/2015 10  6 - 20 mg/dL Final  . Creatinine, Ser 08/22/2015 0.61  0.61 - 1.24 mg/dL Final  . Calcium 08/22/2015 7.9* 8.9 - 10.3 mg/dL Final  . Total Protein 08/22/2015 5.5* 6.5 - 8.1 g/dL Final  . Albumin 08/22/2015 2.4* 3.5 - 5.0 g/dL Final  . AST 08/22/2015 16  15 - 41 U/L Final  . ALT 08/22/2015 9* 17 - 63 U/L Final  . Alkaline Phosphatase 08/22/2015 52  38 - 126 U/L Final  . Total Bilirubin 08/22/2015 0.6  0.3 - 1.2 mg/dL Final  . GFR calc non Af Amer 08/22/2015 >60  >60 mL/min Final  . GFR calc Af Amer 08/22/2015 >60  >60 mL/min Final   Comment: (NOTE) The eGFR has been calculated using the CKD EPI equation. This calculation has not been validated in all clinical situations. eGFR's persistently <60 mL/min signify possible Chronic Kidney Disease.   . Anion gap  08/22/2015 8  5 - 15 Final  .  Magnesium 08/22/2015 1.6* 1.7 - 2.4 mg/dL Final       ASSESSMENT:  A. 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  Carcinoma of tongue in 2006 status post resection and radiation therapy exit staging not known and old records being off pain for review 2.  Difficulty in swallowing 2-3 weeks duration.  Had a normal upper endoscopy in December of 2015 with esophageal dilated Patient history of peptic ulcer disease 3, significant weight loss 4, previous history of chronic drug abuse.  Chronic tobacco abuse.    PLAN:   Multiple gastric ulcer and gastritis may be the root cause of problem with an persistent nausea vomiting I patient was started on cetuximab. All lab data has been reviewed. Patient is receiving intermittent IV fluid and IV Zofran and steroid  After cetuximab started.  Patient started complaining of itching on the face and upper extremity.  Patient was evaluated in the infusion center.  Vital signs were stable.  Oxygenation was stable.  Examination did not reveal any rhonchi or rash.  Patient received paratracheal and 100 mg of Solu-Cortef The patient was examined again itching has resolved.  So cetuximab was restarted at lower dose level Total duration of visit was 45 minutes.  50% or more time was spent in counseling patient and family regarding prognosis and options of treatment and available resources       No matching staging information was found for the patient.  Forest Gleason, MD   08/25/2015 2:50 PM

## 2015-08-27 ENCOUNTER — Other Ambulatory Visit: Payer: Self-pay | Admitting: *Deleted

## 2015-08-27 ENCOUNTER — Inpatient Hospital Stay: Payer: Medicare Other

## 2015-08-27 ENCOUNTER — Ambulatory Visit: Admission: RE | Admit: 2015-08-27 | Payer: Medicare Other | Source: Ambulatory Visit

## 2015-08-27 ENCOUNTER — Inpatient Hospital Stay
Admission: RE | Admit: 2015-08-27 | Discharge: 2015-08-27 | Disposition: A | Discharge status: Home / Self Care | Payer: Self-pay | Source: Ambulatory Visit | Attending: Radiation Oncology | Admitting: Radiation Oncology

## 2015-08-27 DIAGNOSIS — R197 Diarrhea, unspecified: Secondary | ICD-10-CM | POA: Diagnosis not present

## 2015-08-27 DIAGNOSIS — I10 Essential (primary) hypertension: Secondary | ICD-10-CM | POA: Diagnosis not present

## 2015-08-27 DIAGNOSIS — R1310 Dysphagia, unspecified: Secondary | ICD-10-CM | POA: Diagnosis not present

## 2015-08-27 DIAGNOSIS — Z79899 Other long term (current) drug therapy: Secondary | ICD-10-CM | POA: Diagnosis not present

## 2015-08-27 DIAGNOSIS — Z931 Gastrostomy status: Secondary | ICD-10-CM | POA: Diagnosis not present

## 2015-08-27 DIAGNOSIS — K219 Gastro-esophageal reflux disease without esophagitis: Secondary | ICD-10-CM | POA: Diagnosis not present

## 2015-08-27 DIAGNOSIS — C029 Malignant neoplasm of tongue, unspecified: Secondary | ICD-10-CM

## 2015-08-27 DIAGNOSIS — E7800 Pure hypercholesterolemia, unspecified: Secondary | ICD-10-CM | POA: Diagnosis not present

## 2015-08-27 DIAGNOSIS — R112 Nausea with vomiting, unspecified: Secondary | ICD-10-CM | POA: Diagnosis not present

## 2015-08-27 DIAGNOSIS — K259 Gastric ulcer, unspecified as acute or chronic, without hemorrhage or perforation: Secondary | ICD-10-CM | POA: Diagnosis not present

## 2015-08-27 DIAGNOSIS — E860 Dehydration: Secondary | ICD-10-CM | POA: Diagnosis not present

## 2015-08-27 DIAGNOSIS — Z87891 Personal history of nicotine dependence: Secondary | ICD-10-CM | POA: Diagnosis not present

## 2015-08-27 DIAGNOSIS — Z5111 Encounter for antineoplastic chemotherapy: Secondary | ICD-10-CM | POA: Diagnosis not present

## 2015-08-27 DIAGNOSIS — Z85810 Personal history of malignant neoplasm of tongue: Secondary | ICD-10-CM | POA: Diagnosis not present

## 2015-08-27 DIAGNOSIS — R634 Abnormal weight loss: Secondary | ICD-10-CM | POA: Diagnosis not present

## 2015-08-27 MED ORDER — SODIUM CHLORIDE 0.9 % IV SOLN
Freq: Once | INTRAVENOUS | Status: AC
  Administered 2015-08-27: 11:00:00 via INTRAVENOUS
  Filled 2015-08-27: qty 2

## 2015-08-27 MED ORDER — SODIUM CHLORIDE 0.9 % IV SOLN
Freq: Once | INTRAVENOUS | Status: AC
  Administered 2015-08-27: 11:00:00 via INTRAVENOUS
  Filled 2015-08-27: qty 1000

## 2015-08-27 MED ORDER — SODIUM CHLORIDE 0.9 % IJ SOLN
10.0000 mL | INTRAMUSCULAR | Status: DC | PRN
  Administered 2015-08-27: 10 mL
  Filled 2015-08-27: qty 10

## 2015-08-27 MED ORDER — DEXAMETHASONE 4 MG PO TABS: 4.0000 mg | ORAL_TABLET | Freq: Two times a day (BID) | ORAL | Status: DC

## 2015-08-27 MED ORDER — HEPARIN SOD (PORK) LOCK FLUSH 100 UNIT/ML IV SOLN
500.0000 [IU] | Freq: Once | INTRAVENOUS | Status: AC | PRN
  Administered 2015-08-27: 500 [IU]
  Filled 2015-08-27: qty 5

## 2015-08-28 ENCOUNTER — Ambulatory Visit: Payer: Medicare Other

## 2015-08-29 ENCOUNTER — Ambulatory Visit
Admission: RE | Admit: 2015-08-29 | Discharge: 2015-08-29 | Disposition: A | Discharge status: Home / Self Care | Payer: Medicare Other | Source: Ambulatory Visit | Attending: Radiation Oncology | Admitting: Radiation Oncology

## 2015-08-29 DIAGNOSIS — Z87891 Personal history of nicotine dependence: Secondary | ICD-10-CM | POA: Diagnosis not present

## 2015-08-29 DIAGNOSIS — C01 Malignant neoplasm of base of tongue: Secondary | ICD-10-CM | POA: Diagnosis not present

## 2015-08-29 DIAGNOSIS — Z510 Encounter for antineoplastic radiation therapy: Secondary | ICD-10-CM | POA: Diagnosis not present

## 2015-08-29 DIAGNOSIS — C029 Malignant neoplasm of tongue, unspecified: Secondary | ICD-10-CM | POA: Diagnosis not present

## 2015-08-30 ENCOUNTER — Inpatient Hospital Stay: Payer: Medicare Other

## 2015-08-30 ENCOUNTER — Inpatient Hospital Stay (HOSPITAL_BASED_OUTPATIENT_CLINIC_OR_DEPARTMENT_OTHER): Payer: Medicare Other | Admitting: Oncology

## 2015-08-30 ENCOUNTER — Encounter: Payer: Self-pay | Admitting: Oncology

## 2015-08-30 ENCOUNTER — Ambulatory Visit
Admission: RE | Admit: 2015-08-30 | Discharge: 2015-08-30 | Disposition: A | Discharge status: Home / Self Care | Payer: Medicare Other | Source: Ambulatory Visit | Attending: Radiation Oncology | Admitting: Radiation Oncology

## 2015-08-30 VITALS — BP 104/71 | HR 84 | Resp 20

## 2015-08-30 VITALS — BP 103/66 | HR 63 | Temp 96.3°F | Wt 129.4 lb

## 2015-08-30 DIAGNOSIS — K2210 Ulcer of esophagus without bleeding: Secondary | ICD-10-CM | POA: Diagnosis not present

## 2015-08-30 DIAGNOSIS — R112 Nausea with vomiting, unspecified: Secondary | ICD-10-CM

## 2015-08-30 DIAGNOSIS — Z510 Encounter for antineoplastic radiation therapy: Secondary | ICD-10-CM | POA: Diagnosis not present

## 2015-08-30 DIAGNOSIS — K219 Gastro-esophageal reflux disease without esophagitis: Secondary | ICD-10-CM | POA: Diagnosis not present

## 2015-08-30 DIAGNOSIS — C029 Malignant neoplasm of tongue, unspecified: Secondary | ICD-10-CM

## 2015-08-30 DIAGNOSIS — Z931 Gastrostomy status: Secondary | ICD-10-CM | POA: Diagnosis not present

## 2015-08-30 DIAGNOSIS — R634 Abnormal weight loss: Secondary | ICD-10-CM | POA: Diagnosis not present

## 2015-08-30 DIAGNOSIS — K259 Gastric ulcer, unspecified as acute or chronic, without hemorrhage or perforation: Secondary | ICD-10-CM | POA: Diagnosis not present

## 2015-08-30 DIAGNOSIS — Z85810 Personal history of malignant neoplasm of tongue: Secondary | ICD-10-CM | POA: Diagnosis not present

## 2015-08-30 DIAGNOSIS — E7800 Pure hypercholesterolemia, unspecified: Secondary | ICD-10-CM | POA: Diagnosis not present

## 2015-08-30 DIAGNOSIS — Z5111 Encounter for antineoplastic chemotherapy: Secondary | ICD-10-CM | POA: Diagnosis not present

## 2015-08-30 DIAGNOSIS — Z87891 Personal history of nicotine dependence: Secondary | ICD-10-CM | POA: Diagnosis not present

## 2015-08-30 DIAGNOSIS — Z79899 Other long term (current) drug therapy: Secondary | ICD-10-CM

## 2015-08-30 DIAGNOSIS — C01 Malignant neoplasm of base of tongue: Secondary | ICD-10-CM | POA: Diagnosis not present

## 2015-08-30 DIAGNOSIS — R197 Diarrhea, unspecified: Secondary | ICD-10-CM | POA: Diagnosis not present

## 2015-08-30 DIAGNOSIS — I10 Essential (primary) hypertension: Secondary | ICD-10-CM | POA: Diagnosis not present

## 2015-08-30 DIAGNOSIS — R1310 Dysphagia, unspecified: Secondary | ICD-10-CM

## 2015-08-30 DIAGNOSIS — E860 Dehydration: Secondary | ICD-10-CM | POA: Diagnosis not present

## 2015-08-30 LAB — COMPREHENSIVE METABOLIC PANEL
ALK PHOS: 53 U/L (ref 38–126)
ALT: 8 U/L — AB (ref 17–63)
AST: 17 U/L (ref 15–41)
Albumin: 2.7 g/dL — ABNORMAL LOW (ref 3.5–5.0)
Anion gap: 4 — ABNORMAL LOW (ref 5–15)
BUN: 11 mg/dL (ref 6–20)
CHLORIDE: 105 mmol/L (ref 101–111)
CO2: 27 mmol/L (ref 22–32)
CREATININE: 0.58 mg/dL — AB (ref 0.61–1.24)
Calcium: 8.3 mg/dL — ABNORMAL LOW (ref 8.9–10.3)
GFR calc Af Amer: >60 mL/min (ref >60)
Glucose, Bld: 137 mg/dL — ABNORMAL HIGH (ref 65–99)
Potassium: 3.3 mmol/L — ABNORMAL LOW (ref 3.5–5.1)
Sodium: 136 mmol/L (ref 135–145)
Total Bilirubin: 0.3 mg/dL (ref 0.3–1.2)
Total Protein: 5.8 g/dL — ABNORMAL LOW (ref 6.5–8.1)

## 2015-08-30 LAB — MAGNESIUM: Magnesium: 1.7 mg/dL (ref 1.7–2.4)

## 2015-08-30 MED ORDER — DIPHENHYDRAMINE HCL 50 MG/ML IJ SOLN
50.0000 mg | Freq: Once | INTRAMUSCULAR | Status: AC
  Administered 2015-08-30: 50 mg via INTRAVENOUS
  Filled 2015-08-30: qty 1

## 2015-08-30 MED ORDER — SODIUM CHLORIDE 0.9 % IV SOLN
Freq: Once | INTRAVENOUS | Status: AC
  Administered 2015-08-30: 10:00:00 via INTRAVENOUS
  Filled 2015-08-30: qty 250

## 2015-08-30 MED ORDER — CETUXIMAB CHEMO IV INJECTION 200 MG/100ML
250.0000 mg/m2 | Freq: Once | INTRAVENOUS | Status: AC
  Administered 2015-08-30: 500 mg via INTRAVENOUS
  Filled 2015-08-30: qty 100

## 2015-08-30 MED ORDER — HEPARIN SOD (PORK) LOCK FLUSH 100 UNIT/ML IV SOLN
500.0000 [IU] | Freq: Once | INTRAVENOUS | Status: DC | PRN
Start: 2015-08-30 — End: 2015-09-07
  Filled 2015-08-30 (×2): qty 5

## 2015-08-30 MED ORDER — SODIUM CHLORIDE 0.9 % IV SOLN
Freq: Once | INTRAVENOUS | Status: AC
  Administered 2015-08-30: 10:00:00 via INTRAVENOUS
  Filled 2015-08-30: qty 1000

## 2015-08-30 MED ORDER — HYDROCORTISONE NA SUCCINATE PF 100 MG IJ SOLR
100.0000 mg | Freq: Once | INTRAMUSCULAR | Status: AC
  Administered 2015-08-30: 100 mg via INTRAVENOUS
  Filled 2015-08-30: qty 2

## 2015-08-30 NOTE — Addendum Note (Signed)
Addended by: Telford Nab on: 08/30/2015 09:02 AM   Modules accepted: Orders

## 2015-08-30 NOTE — Progress Notes (Signed)
[place] @ [facility] [phone]  Fax:(336) [place] OB: 12/21/1955  MR#: [id number]  HKV#:[id number]  Patient Care Team: Cyndi Bender, PA-C as PCP - General (Physician Assistant) Beverly Gust, MD (Unknown Physician Specialty)  CHIEF COMPLAINT:  Chief Complaint  Patient presents with  . OTHER   1.  Has a history of cancer of the tongue in 2006.  Patient underwent resection followed by radiation therapy 2.  Increasing difficulty in swallowing for last 2 or 3 weeks.  Patient had upper endoscopy done in December of 2015. 3.  Significant weight loss 4.  Abnormal PET scan (August of 2016) 5.  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  (Cis-platinum and radiation therapy) (September 2 016) T3 N0 M0 tumor Status post PEG tube placement (September, 2016) 6. Poor tolerance dose cis-platinum therapy, so patient has been switched over to cetuximab along with radiation therapy (October, 2016)  VISIT DIAGNOSIS:  Significant weight loss. Difficulty swallowing. History of carcinoma of tongue    Oncology Flowsheet 08/15/2015 08/16/2015 08/17/2015 08/20/2015 08/21/2015 08/22/2015 08/27/2015  Day, Cycle - - - - - - -  cetuximab (ERBITUX) IV - - - - - 400 mg/m2 -  CISplatin (PLATINOL) IV - - - - - - -  dexamethasone (DECADRON) IJ - - - - - - -  dexamethasone (DECADRON) IV [ 4 mg ] [ 4 mg ] [ 4 mg ] [ 4 mg ] [ 4 mg ] 4 mg [ 4 mg ]  enoxaparin (LOVENOX)  - - - - - - -  fosaprepitant (EMEND) IV - - - - - - -  granisetron (KYTRIL) PO - - - - - - -  LORazepam (ATIVAN) IV - - - - - - -  metoCLOPramide (REGLAN) IV 10 mg 10 mg 10 mg - - - -  ondansetron (ZOFRAN) IV [ 4 mg ] [ 4 mg ] [ 4 mg ] [ 4 mg ] [ 4 mg ] - [ 4 mg ]  palonosetron (ALOXI) IV - - - - - - -  promethazine (PHENERGAN) IM - - - - - - -  promethazine (PHENERGAN) IV - - - - - - -    INTERVAL HISTORY:    I received a phone call from ENT surgeon to see this 59 year old gentleman who had been seen by me several years ago.  Patient had a history of carcinoma of lung (exact state not known) old chart being reviewed.  At present old records are not available on EMR. Patient is here for continuation of radiation therapy Patient had mild reaction to cetuximab which has been treated with Solu-Cortef.  Patient's appetite is improving patient is not getting any feeding through PEG tube being evaluated today by gastroenterologist.  According to patient whenever he takes any feeding through 2 he develops nausea vomiting diarrhea. Continue radiation therapy here for continuation of cetuximab treatment  REVIEW OF SYSTEMS:   Gen. status: Patient is feeling weak and tired.  Has lost significant weight Poor appetite patient is very depressed.  Losing weight. Increasing difficulty swallowing as mentioned in history of present illness HEENT: As mentioned in history of present illness patient had history of carcinoma of tongue status post radiation therapy recently having increasing soreness in the mouth. Lungs: Increasing cough shortness of breath yellowish expectoration no fever no hemoptysis GI: Persistent nausea as described above in history of present illness [place]  close skeletal system no bony pain Lower extremity no swelling Skin: No rash Abdomen: Had a PEG tube placement  As per HPI. Otherwise, a complete review of systems is negatve.  PAST MEDICAL HISTORY: Carcinoma of tongue Hypertension Hypercholesterolemia Previous substance abuse Gastroesophageal reflux disease  PAST SURGICAL HISTORY: Treated for carcinoma of tongue FAMILY HISTORY There is no significant family history of breast cancer, ovarian cancer, colon cancer    ADVANCED DIRECTIVES:  Patient does not have any living will or healthcare power of attorney.  Information was given .  Available resources had been discussed.  We will  follow-up on subsequent appointments regarding this issue  HEALTH MAINTENANCE: Social History  Substance Use Topics  . Smoking status: Former Smoker -- 1.00 packs/day for 0 years    Types: Cigarettes    Quit date: 09/21/2014  . Smokeless tobacco: None  . Alcohol Use: 7.2 oz/week    12 Cans of beer per week     Comment: beer/wine every day    Quit smoking in November of 2015.  History of smoking for several years in the past.  No Known Allergies  Current Outpatient Prescriptions  Medication Sig Dispense Refill  . acidophilus (RISAQUAD) CAPS capsule Take 1 capsule by mouth daily.    . chlorhexidine (PERIDEX) 0.12 % solution 15 mLs by Mouth Rinse route 2 (two) times daily. 120 mL 0  . citalopram (CELEXA) 20 MG tablet Take 1 tablet (20 mg total) by mouth daily. 30 tablet 3  . dexamethasone (DECADRON) 4 MG tablet Take 1 tablet (4 mg total) by mouth 2 (two) times daily with a meal. 50 tablet 0  . fentaNYL (DURAGESIC - DOSED MCG/HR) 25 MCG/HR patch Place 2 patches (50 mcg total) onto the skin every 3 (three) days. 10 patch 0  . lidocaine-prilocaine (EMLA) cream Apply 1 application topically as needed. (Patient taking differently: Apply 1 application topically as needed (prior to accessing port). ) 30 g 3  . lisinopril (PRINIVIL,ZESTRIL) 10 MG tablet Take 10 mg by mouth daily.    . ondansetron (ZOFRAN) 4 MG tablet Take 1 tablet (4 mg total) by mouth every 4 (four) hours as needed for nausea or vomiting. 45 tablet 0  . oxycodone (ROXICODONE) 30 MG immediate release tablet Take 1 tablet (30 mg total) by mouth every 4 (four) hours as needed for pain. Take 1 tablet orally 7 times daily 60 tablet 0  . pantoprazole (PROTONIX) 40 MG tablet Take 1 tablet (40 mg total) by mouth 2 (two) times daily. 60 tablet 3  . phenol (CHLORASEPTIC) 1.4 % LIQD Use as directed 1 spray in the mouth or throat as needed for throat irritation / pain. 177 mL 0  . pravastatin (PRAVACHOL) 40 MG tablet Take 40 mg by mouth  daily.    . promethazine (PHENERGAN) 25 MG suppository Place 1 suppository (25 mg total) rectally every 8 (eight) hours as needed for nausea or vomiting. 12 each 0  . sucralfate (CARAFATE) 1 GM/10ML suspension Take 10 mLs (1 g total) by mouth 4 (four) times daily -  with meals and at bedtime. 420 mL 0  . traZODone (DESYREL) 100 MG tablet Take 100 mg by mouth at bedtime.    . Saccharomyces boulardii (FLORASTOR PO) Take by mouth.     No current facility-administered medications for this visit.    OBJECTIVE: PHYSICAL EXAM: Gen. status: Patient is seen lean and cachectic. HEENT: No soreness in the mouth.  But swelling which is diffuse Lymphatic system: Supraclavicular, cervical, axillary,  inguinal lymph nodes are not palpable Lungs: Bilateral rhonchi and occasional crepitation. Cardiac: Tachycardia Examination of the skin revealed no evidence of significant rashes, suspicious appearing nevi or other concerning lesions.. Abdominal exam revealed normal bowel sounds. The abdomen was soft, non-tender, and without masses, organomegaly, or appreciable enlargement of the abdominal aorta..   Peg  placement Psychiatric system: Depression and anxiety  Filed Vitals:   08/30/15 0841  BP: 103/66  Pulse: 63  Temp: 96.3 F (35.7 C)     Body mass index is 16.61 kg/(m^2).    ECOG FS:1 - Symptomatic but completely ambulatory  LAB RESULTS:  Infusion on 08/30/2015  Component Date Value Ref Range Status  . WBC 08/30/2015 5.3  3.8 - 10.6 K/uL Final  . RBC 08/30/2015 3.47* 4.40 - 5.90 MIL/uL Final  . Hemoglobin 08/30/2015 10.8* 13.0 - 18.0 g/dL Final  . HCT 08/30/2015 32.3* 40.0 - 52.0 % Final  . MCV 08/30/2015 93.2  80.0 - 100.0 fL Final  . MCH 08/30/2015 31.2  26.0 - 34.0 pg Final  . MCHC 08/30/2015 33.4  32.0 - 36.0 g/dL Final  . RDW 08/30/2015 14.4  11.5 - 14.5 % Final  . Platelets 08/30/2015 397  150 - 440 K/uL Final  . Neutrophils Relative % 08/30/2015 65   Final  . Neutro Abs 08/30/2015 3.4   1.4 - 6.5 K/uL Final  . Lymphocytes Relative 08/30/2015 27   Final  . Lymphs Abs 08/30/2015 1.4  1.0 - 3.6 K/uL Final  . Monocytes Relative 08/30/2015 8   Final  . Monocytes Absolute 08/30/2015 0.4  0.2 - 1.0 K/uL Final  . Eosinophils Relative 08/30/2015 0   Final  . Eosinophils Absolute 08/30/2015 0.0  0 - 0.7 K/uL Final  . Basophils Relative 08/30/2015 0   Final  . Basophils Absolute 08/30/2015 0.0  0 - 0.1 K/uL Final       ASSESSMENT:  A. 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  Carcinoma of tongue in 2006 status post resection and radiation therapy exit staging not known and old records being off pain for review 2.  Difficulty in swallowing 2-3 weeks duration.  Had a normal upper endoscopy in December of 2015 with esophageal dilated Patient history of peptic ulcer disease 3, significant weight loss 4, previous history of chronic drug abuse.  Chronic tobacco abuse.    PLAN:   Multiple gastric ulcer and gastritis may be the root cause of problem with an persistent nausea vomiting I patient was started on cetuximab. All lab data has been reviewed. Patient is receiving intermittent IV fluid and IV Zofran and steroid  We will proceed with cetuximab.  Because of reaction to chemotherapy before we give Solu-Cortef and Benadryl prior to cetuximab. Patient has an appointment with gastroenterology about PEG tube management I would like to find dietary supplement which patient can tolerate.  Dietitian consultation may be advisable. Jevity causing diarrhea.  chemotherapy with cetuximab and in between and hydration.       No matching staging information was found for the patient.  Forest Gleason, MD   08/30/2015 8:54 AM

## 2015-08-31 ENCOUNTER — Ambulatory Visit
Admission: RE | Admit: 2015-08-31 | Discharge: 2015-08-31 | Disposition: A | Discharge status: Home / Self Care | Payer: Medicare Other | Source: Ambulatory Visit | Attending: Radiation Oncology | Admitting: Radiation Oncology

## 2015-08-31 DIAGNOSIS — Z510 Encounter for antineoplastic radiation therapy: Secondary | ICD-10-CM | POA: Diagnosis not present

## 2015-08-31 DIAGNOSIS — C01 Malignant neoplasm of base of tongue: Secondary | ICD-10-CM | POA: Diagnosis not present

## 2015-08-31 DIAGNOSIS — Z87891 Personal history of nicotine dependence: Secondary | ICD-10-CM | POA: Diagnosis not present

## 2015-08-31 DIAGNOSIS — C029 Malignant neoplasm of tongue, unspecified: Secondary | ICD-10-CM | POA: Diagnosis not present

## 2015-08-31 LAB — CBC WITH DIFFERENTIAL/PLATELET
BASOS ABS: 0 10*3/uL (ref 0–0.1)
Basophils Relative: 0 %
EOS PCT: 0 %
Eosinophils Absolute: 0 10*3/uL (ref 0–0.7)
HCT: 32.3 % — ABNORMAL LOW (ref 40.0–52.0)
HEMOGLOBIN: 10.8 g/dL — AB (ref 13.0–18.0)
LYMPHS PCT: 27 %
Lymphs Abs: 1.4 10*3/uL (ref 1.0–3.6)
MCH: 31.2 pg (ref 26.0–34.0)
MCHC: 33.4 g/dL (ref 32.0–36.0)
MCV: 93.2 fL (ref 80.0–100.0)
Monocytes Absolute: 0.4 10*3/uL (ref 0.2–1.0)
Monocytes Relative: 8 %
NEUTROS ABS: 3.4 10*3/uL (ref 1.4–6.5)
NEUTROS PCT: 65 %
Platelets: 397 10*3/uL (ref 150–440)
RBC: 3.47 MIL/uL — AB (ref 4.40–5.90)
RDW: 14.4 % (ref 11.5–14.5)
WBC: 5.3 10*3/uL (ref 4.0–10.5)

## 2015-09-03 ENCOUNTER — Inpatient Hospital Stay: Payer: Medicare Other

## 2015-09-03 ENCOUNTER — Ambulatory Visit
Admission: RE | Admit: 2015-09-03 | Discharge: 2015-09-03 | Disposition: A | Discharge status: Home / Self Care | Payer: Medicare Other | Source: Ambulatory Visit | Attending: Radiation Oncology | Admitting: Radiation Oncology

## 2015-09-03 ENCOUNTER — Ambulatory Visit: Payer: Medicare Other

## 2015-09-03 VITALS — BP 128/83 | HR 66 | Temp 95.6°F

## 2015-09-03 DIAGNOSIS — Z931 Gastrostomy status: Secondary | ICD-10-CM | POA: Diagnosis not present

## 2015-09-03 DIAGNOSIS — C029 Malignant neoplasm of tongue, unspecified: Secondary | ICD-10-CM | POA: Diagnosis not present

## 2015-09-03 DIAGNOSIS — I10 Essential (primary) hypertension: Secondary | ICD-10-CM | POA: Diagnosis not present

## 2015-09-03 DIAGNOSIS — E860 Dehydration: Secondary | ICD-10-CM | POA: Diagnosis not present

## 2015-09-03 DIAGNOSIS — Z85810 Personal history of malignant neoplasm of tongue: Secondary | ICD-10-CM | POA: Diagnosis not present

## 2015-09-03 DIAGNOSIS — R197 Diarrhea, unspecified: Secondary | ICD-10-CM | POA: Diagnosis not present

## 2015-09-03 DIAGNOSIS — Z79899 Other long term (current) drug therapy: Secondary | ICD-10-CM | POA: Diagnosis not present

## 2015-09-03 DIAGNOSIS — C01 Malignant neoplasm of base of tongue: Secondary | ICD-10-CM | POA: Diagnosis not present

## 2015-09-03 DIAGNOSIS — Z5111 Encounter for antineoplastic chemotherapy: Secondary | ICD-10-CM | POA: Diagnosis not present

## 2015-09-03 DIAGNOSIS — K259 Gastric ulcer, unspecified as acute or chronic, without hemorrhage or perforation: Secondary | ICD-10-CM | POA: Diagnosis not present

## 2015-09-03 DIAGNOSIS — R634 Abnormal weight loss: Secondary | ICD-10-CM | POA: Diagnosis not present

## 2015-09-03 DIAGNOSIS — R112 Nausea with vomiting, unspecified: Secondary | ICD-10-CM | POA: Diagnosis not present

## 2015-09-03 DIAGNOSIS — Z87891 Personal history of nicotine dependence: Secondary | ICD-10-CM | POA: Diagnosis not present

## 2015-09-03 DIAGNOSIS — E7800 Pure hypercholesterolemia, unspecified: Secondary | ICD-10-CM | POA: Diagnosis not present

## 2015-09-03 DIAGNOSIS — Z510 Encounter for antineoplastic radiation therapy: Secondary | ICD-10-CM | POA: Diagnosis not present

## 2015-09-03 DIAGNOSIS — K219 Gastro-esophageal reflux disease without esophagitis: Secondary | ICD-10-CM | POA: Diagnosis not present

## 2015-09-03 DIAGNOSIS — R1310 Dysphagia, unspecified: Secondary | ICD-10-CM | POA: Diagnosis not present

## 2015-09-03 MED ORDER — HEPARIN SOD (PORK) LOCK FLUSH 100 UNIT/ML IV SOLN
500.0000 [IU] | Freq: Once | INTRAVENOUS | Status: AC | PRN
  Administered 2015-09-03: 500 [IU]
  Filled 2015-09-03: qty 5

## 2015-09-03 MED ORDER — OXYCODONE HCL 30 MG PO TABS: ORAL_TABLET | ORAL | Status: DC

## 2015-09-03 MED ORDER — SODIUM CHLORIDE 0.9 % IV SOLN
Freq: Once | INTRAVENOUS | Status: AC
  Administered 2015-09-03: 10:00:00 via INTRAVENOUS
  Filled 2015-09-03: qty 2

## 2015-09-03 MED ORDER — FENTANYL 25 MCG/HR TD PT72: 50.0000 ug | MEDICATED_PATCH | TRANSDERMAL | Status: DC

## 2015-09-03 MED ORDER — SODIUM CHLORIDE 0.9 % IJ SOLN
10.0000 mL | INTRAMUSCULAR | Status: DC | PRN
  Administered 2015-09-03: 10 mL
  Filled 2015-09-03: qty 10

## 2015-09-03 MED ORDER — FENTANYL 50 MCG/HR TD PT72: 50.0000 ug | MEDICATED_PATCH | TRANSDERMAL | Status: DC

## 2015-09-03 MED ORDER — SODIUM CHLORIDE 0.9 % IV SOLN
Freq: Once | INTRAVENOUS | Status: AC
  Administered 2015-09-03: 10:00:00 via INTRAVENOUS
  Filled 2015-09-03: qty 1000

## 2015-09-04 ENCOUNTER — Ambulatory Visit
Admission: RE | Admit: 2015-09-04 | Discharge: 2015-09-04 | Disposition: A | Discharge status: Home / Self Care | Payer: Medicare Other | Source: Ambulatory Visit | Attending: Radiation Oncology | Admitting: Radiation Oncology

## 2015-09-04 DIAGNOSIS — Z87891 Personal history of nicotine dependence: Secondary | ICD-10-CM | POA: Diagnosis not present

## 2015-09-04 DIAGNOSIS — Z510 Encounter for antineoplastic radiation therapy: Secondary | ICD-10-CM | POA: Diagnosis not present

## 2015-09-04 DIAGNOSIS — C01 Malignant neoplasm of base of tongue: Secondary | ICD-10-CM | POA: Diagnosis not present

## 2015-09-04 DIAGNOSIS — C029 Malignant neoplasm of tongue, unspecified: Secondary | ICD-10-CM | POA: Diagnosis not present

## 2015-09-05 ENCOUNTER — Ambulatory Visit: Payer: Medicare Other

## 2015-09-05 ENCOUNTER — Ambulatory Visit
Admission: RE | Admit: 2015-09-05 | Discharge: 2015-09-05 | Disposition: A | Discharge status: Home / Self Care | Payer: Medicare Other | Source: Ambulatory Visit | Attending: Radiation Oncology | Admitting: Radiation Oncology

## 2015-09-05 DIAGNOSIS — Z87891 Personal history of nicotine dependence: Secondary | ICD-10-CM | POA: Diagnosis not present

## 2015-09-05 DIAGNOSIS — Z510 Encounter for antineoplastic radiation therapy: Secondary | ICD-10-CM | POA: Diagnosis not present

## 2015-09-05 DIAGNOSIS — C029 Malignant neoplasm of tongue, unspecified: Secondary | ICD-10-CM | POA: Diagnosis not present

## 2015-09-05 DIAGNOSIS — C01 Malignant neoplasm of base of tongue: Secondary | ICD-10-CM | POA: Diagnosis not present

## 2015-09-05 NOTE — Progress Notes (Unsigned)
PSN met with patient and wife.  Due to limited income, and higher health care costs at this time, patient is having some difficulty paying monthly expenses.  PSN will assist through the charitable funds.

## 2015-09-06 ENCOUNTER — Inpatient Hospital Stay: Payer: Medicare Other

## 2015-09-06 ENCOUNTER — Ambulatory Visit
Admission: RE | Admit: 2015-09-06 | Discharge: 2015-09-06 | Disposition: A | Discharge status: Home / Self Care | Payer: Medicare Other | Source: Ambulatory Visit | Attending: Radiation Oncology | Admitting: Radiation Oncology

## 2015-09-06 ENCOUNTER — Inpatient Hospital Stay (HOSPITAL_BASED_OUTPATIENT_CLINIC_OR_DEPARTMENT_OTHER): Payer: Medicare Other | Admitting: Oncology

## 2015-09-06 ENCOUNTER — Encounter: Payer: Self-pay | Admitting: Oncology

## 2015-09-06 ENCOUNTER — Ambulatory Visit: Payer: Medicare Other

## 2015-09-06 VITALS — BP 102/73 | HR 71 | Temp 96.2°F | Wt 123.9 lb

## 2015-09-06 DIAGNOSIS — R112 Nausea with vomiting, unspecified: Secondary | ICD-10-CM | POA: Diagnosis not present

## 2015-09-06 DIAGNOSIS — R634 Abnormal weight loss: Secondary | ICD-10-CM

## 2015-09-06 DIAGNOSIS — C01 Malignant neoplasm of base of tongue: Secondary | ICD-10-CM | POA: Diagnosis not present

## 2015-09-06 DIAGNOSIS — E7800 Pure hypercholesterolemia, unspecified: Secondary | ICD-10-CM | POA: Diagnosis not present

## 2015-09-06 DIAGNOSIS — Z79899 Other long term (current) drug therapy: Secondary | ICD-10-CM

## 2015-09-06 DIAGNOSIS — R197 Diarrhea, unspecified: Secondary | ICD-10-CM | POA: Diagnosis not present

## 2015-09-06 DIAGNOSIS — C029 Malignant neoplasm of tongue, unspecified: Secondary | ICD-10-CM

## 2015-09-06 DIAGNOSIS — R1310 Dysphagia, unspecified: Secondary | ICD-10-CM | POA: Diagnosis not present

## 2015-09-06 DIAGNOSIS — Z85810 Personal history of malignant neoplasm of tongue: Secondary | ICD-10-CM | POA: Diagnosis not present

## 2015-09-06 DIAGNOSIS — K219 Gastro-esophageal reflux disease without esophagitis: Secondary | ICD-10-CM | POA: Diagnosis not present

## 2015-09-06 DIAGNOSIS — Z931 Gastrostomy status: Secondary | ICD-10-CM | POA: Diagnosis not present

## 2015-09-06 DIAGNOSIS — Z5111 Encounter for antineoplastic chemotherapy: Secondary | ICD-10-CM | POA: Diagnosis not present

## 2015-09-06 DIAGNOSIS — Z510 Encounter for antineoplastic radiation therapy: Secondary | ICD-10-CM | POA: Diagnosis not present

## 2015-09-06 DIAGNOSIS — K259 Gastric ulcer, unspecified as acute or chronic, without hemorrhage or perforation: Secondary | ICD-10-CM

## 2015-09-06 DIAGNOSIS — Z87891 Personal history of nicotine dependence: Secondary | ICD-10-CM | POA: Diagnosis not present

## 2015-09-06 DIAGNOSIS — I10 Essential (primary) hypertension: Secondary | ICD-10-CM | POA: Diagnosis not present

## 2015-09-06 DIAGNOSIS — E860 Dehydration: Secondary | ICD-10-CM | POA: Diagnosis not present

## 2015-09-06 LAB — COMPREHENSIVE METABOLIC PANEL
ALBUMIN: 2.9 g/dL — AB (ref 3.5–5.0)
ALK PHOS: 51 U/L (ref 38–126)
ALT: 10 U/L — ABNORMAL LOW (ref 17–63)
AST: 19 U/L (ref 15–41)
Anion gap: 6 (ref 5–15)
BILIRUBIN TOTAL: 0.6 mg/dL (ref 0.3–1.2)
BUN: 13 mg/dL (ref 6–20)
CALCIUM: 7.9 mg/dL — AB (ref 8.9–10.3)
CO2: 25 mmol/L (ref 22–32)
CREATININE: 0.65 mg/dL (ref 0.61–1.24)
Chloride: 101 mmol/L (ref 101–111)
GFR calc Af Amer: >60 mL/min (ref >60)
GFR calc non Af Amer: >60 mL/min (ref >60)
GLUCOSE: 117 mg/dL — AB (ref 65–99)
Potassium: 3.7 mmol/L (ref 3.5–5.1)
Sodium: 132 mmol/L — ABNORMAL LOW (ref 135–145)
TOTAL PROTEIN: 5.8 g/dL — AB (ref 6.5–8.1)

## 2015-09-06 LAB — CBC WITH DIFFERENTIAL/PLATELET
BASOS ABS: 0 10*3/uL (ref 0–0.1)
BASOS PCT: 0 %
Eosinophils Absolute: 0 10*3/uL (ref 0–0.7)
Eosinophils Relative: 0 %
HEMATOCRIT: 35.8 % — AB (ref 40.0–52.0)
HEMOGLOBIN: 11.8 g/dL — AB (ref 13.0–18.0)
LYMPHS PCT: 17 %
Lymphs Abs: 1.9 10*3/uL (ref 1.0–3.6)
MCH: 30.6 pg (ref 26.0–34.0)
MCHC: 32.8 g/dL (ref 32.0–36.0)
MCV: 93.3 fL (ref 80.0–100.0)
MONOS PCT: 7 %
Monocytes Absolute: 0.8 10*3/uL (ref 0.2–1.0)
NEUTROS ABS: 8.6 10*3/uL — AB (ref 1.4–6.5)
NEUTROS PCT: 76 %
Platelets: 375 10*3/uL (ref 150–440)
RBC: 3.84 MIL/uL — ABNORMAL LOW (ref 4.40–5.90)
RDW: 15.1 % — ABNORMAL HIGH (ref 11.5–14.5)
WBC: 11.2 10*3/uL — ABNORMAL HIGH (ref 3.8–10.6)

## 2015-09-06 LAB — MAGNESIUM: Magnesium: 2.2 mg/dL (ref 1.7–2.4)

## 2015-09-06 MED ORDER — CETUXIMAB CHEMO IV INJECTION 200 MG/100ML
400.0000 mg | Freq: Once | INTRAVENOUS | Status: AC
  Administered 2015-09-06: 400 mg via INTRAVENOUS
  Filled 2015-09-06: qty 200

## 2015-09-06 MED ORDER — HEPARIN SOD (PORK) LOCK FLUSH 100 UNIT/ML IV SOLN
500.0000 [IU] | Freq: Once | INTRAVENOUS | Status: AC | PRN
  Administered 2015-09-06: 500 [IU]
  Filled 2015-09-06: qty 5

## 2015-09-06 MED ORDER — SODIUM CHLORIDE 0.9 % IV SOLN
Freq: Once | INTRAVENOUS | Status: AC
  Administered 2015-09-06: 12:00:00 via INTRAVENOUS
  Filled 2015-09-06: qty 1000

## 2015-09-06 MED ORDER — DIPHENHYDRAMINE HCL 50 MG/ML IJ SOLN
50.0000 mg | Freq: Once | INTRAMUSCULAR | Status: AC
  Administered 2015-09-06: 50 mg via INTRAVENOUS
  Filled 2015-09-06: qty 1

## 2015-09-06 MED ORDER — HYDROCORTISONE NA SUCCINATE PF 100 MG IJ SOLR
100.0000 mg | Freq: Once | INTRAMUSCULAR | Status: AC
  Administered 2015-09-06: 100 mg via INTRAVENOUS
  Filled 2015-09-06: qty 2

## 2015-09-06 NOTE — Progress Notes (Signed)
Patient requesting refill for medicated mouthwash - states he talked with Barnabas Lister yesterday who told him to get his medicaitons that are prescribed from [place] at [facility].

## 2015-09-06 NOTE — Progress Notes (Signed)
[place] @ [facility] [phone]  Fax:(336) [place] OB: 06-13-1956  MR#: [id number]  YIR#:[id number]  Patient Care Team: Cyndi Bender, PA-C as PCP - General (Physician Assistant) Beverly Gust, MD (Unknown Physician Specialty)  CHIEF COMPLAINT:  Chief Complaint  Patient presents with  . OTHER   1.  Has a history of cancer of the tongue in 2006.  Patient underwent resection followed by radiation therapy 2.  Increasing difficulty in swallowing for last 2 or 3 weeks.  Patient had upper endoscopy done in December of 2015. 3.  Significant weight loss 4.  Abnormal PET scan (August of 2016) 5.  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  (Cis-platinum and radiation therapy) (September 2 016) T3 N0 M0 tumor Status post PEG tube placement (September, 2016) 6. Poor tolerance dose cis-platinum therapy, so patient has been switched over to cetuximab along with radiation therapy (October, 2016)  VISIT DIAGNOSIS:  Significant weight loss. Difficulty swallowing. History of carcinoma of tongue    Oncology Flowsheet 08/17/2015 08/20/2015 08/21/2015 08/22/2015 08/27/2015 08/30/2015 09/03/2015  Day, Cycle - - - - - - -  cetuximab (ERBITUX) IV - - - 400 mg/m2 - 250 mg/m2 -  CISplatin (PLATINOL) IV - - - - - - -  dexamethasone (DECADRON) IJ - - - - - - -  dexamethasone (DECADRON) IV [ 4 mg ] [ 4 mg ] [ 4 mg ] 4 mg [ 4 mg ] - [ 4 mg ]  enoxaparin (LOVENOX) [place] - - - - - - -  fosaprepitant (EMEND) IV - - - - - - -  granisetron (KYTRIL) PO - - - - - - -  LORazepam (ATIVAN) IV - - - - - - -  metoCLOPramide (REGLAN) IV 10 mg - - - - - -  ondansetron (ZOFRAN) IV [ 4 mg ] [ 4 mg ] [ 4 mg ] - [ 4 mg ] - [ 4 mg ]  palonosetron (ALOXI) IV - - - - - - -  promethazine (PHENERGAN) IM - - - - - - -  promethazine (PHENERGAN) IV - - - - - - -    INTERVAL HISTORY:  Patient has  been evaluated by gastroenterologist.  Patient still has a PEG tube but does not get any feeding through it. Previously was getting sick and throwing up Continues to lose weight.  Tolerated cetuximab after starting small dose of Solu cortef  Pain has improved.  There is soreness in the throat but continues to swallow better.  Here for further follow-up and treatment consideration  Nausea and vomiting is improved patient gets intermittent fluidbecause of REVIEW OF SYSTEMS:   Gen. status: Patient is feeling weak and tired.  Has lost significant weight Poor appetite patient is very depressed.  Losing weight. Increasing difficulty swallowing as mentioned in history of present illness HEENT: As mentioned in history of present illness patient had history of carcinoma of tongue status post radiation therapy recently having increasing soreness in the mouth. Lungs: Increasing cough shortness of breath yellowish expectoration no fever no hemoptysis GI: Persistent nausea as described above in history of present illness [place] close skeletal system no bony pain Lower extremity no swelling Skin: No rash Abdomen: Had a PEG tube placement  As per HPI. Otherwise, a complete review of systems is negatve.  PAST MEDICAL HISTORY: Carcinoma of tongue Hypertension Hypercholesterolemia Previous substance abuse Gastroesophageal reflux  disease  PAST SURGICAL HISTORY: Treated for carcinoma of tongue FAMILY HISTORY There is no significant family history of breast cancer, ovarian cancer, colon cancer    ADVANCED DIRECTIVES:  Patient does not have any living will or healthcare power of attorney.  Information was given .  Available resources had been discussed.  We will follow-up on subsequent appointments regarding this issue  HEALTH MAINTENANCE: Social History  Substance Use Topics  . Smoking status: Former Smoker -- 1.00 packs/day for 0 years    Types: Cigarettes    Quit date: 09/21/2014  . Smokeless  tobacco: None  . Alcohol Use: 7.2 oz/week    12 Cans of beer per week     Comment: beer/wine every day    Quit smoking in November of 2015.  History of smoking for several years in the past.  No Known Allergies  Current Outpatient Prescriptions  Medication Sig Dispense Refill  . acidophilus (RISAQUAD) CAPS capsule Take 1 capsule by mouth daily.    . chlorhexidine (PERIDEX) 0.12 % solution 15 mLs by Mouth Rinse route 2 (two) times daily. 120 mL 0  . citalopram (CELEXA) 20 MG tablet Take 1 tablet (20 mg total) by mouth daily. 30 tablet 3  . dexamethasone (DECADRON) 4 MG tablet Take 1 tablet (4 mg total) by mouth 2 (two) times daily with a meal. 50 tablet 0  . fentaNYL (DURAGESIC - DOSED MCG/HR) 50 MCG/HR Place 1 patch (50 mcg total) onto the skin every 3 (three) days. 10 patch 0  . lidocaine-prilocaine (EMLA) cream Apply 1 application topically as needed. (Patient taking differently: Apply 1 application topically as needed (prior to accessing port). ) 30 g 3  . lisinopril (PRINIVIL,ZESTRIL) 10 MG tablet Take 10 mg by mouth daily.    . ondansetron (ZOFRAN) 4 MG tablet Take 1 tablet (4 mg total) by mouth every 4 (four) hours as needed for nausea or vomiting. 45 tablet 0  . oxycodone (ROXICODONE) 30 MG immediate release tablet Take 1 tablet PO 7 times daily PRN for pain 60 tablet 0  . pantoprazole (PROTONIX) 40 MG tablet Take 1 tablet (40 mg total) by mouth 2 (two) times daily. 60 tablet 3  . phenol (CHLORASEPTIC) 1.4 % LIQD Use as directed 1 spray in the mouth or throat as needed for throat irritation / pain. 177 mL 0  . pravastatin (PRAVACHOL) 40 MG tablet Take 40 mg by mouth daily.    . promethazine (PHENERGAN) 25 MG suppository Place 1 suppository (25 mg total) rectally every 8 (eight) hours as needed for nausea or vomiting. 12 each 0  . Saccharomyces boulardii (FLORASTOR PO) Take by mouth.    . sucralfate (CARAFATE) 1 GM/10ML suspension Take 10 mLs (1 g total) by mouth 4 (four) times daily  -  with meals and at bedtime. 420 mL 0  . traZODone (DESYREL) 100 MG tablet Take 100 mg by mouth at bedtime.     No current facility-administered medications for this visit.   Facility-Administered Medications Ordered in Other Visits  Medication Dose Route Frequency Provider Last Rate Last Dose  . heparin lock flush 100 unit/mL  500 Units Intracatheter Once PRN Forest Gleason, MD        OBJECTIVE: PHYSICAL EXAM: Gen. status: Patient is seen lean and cachectic. HEENT: No soreness in the mouth.  But swelling which is diffuse Lymphatic system: Supraclavicular, cervical, axillary, inguinal lymph nodes are not palpable Lungs: Bilateral rhonchi and occasional crepitation. Cardiac: Tachycardia Examination of the skin revealed no evidence  of significant rashes, suspicious appearing nevi or other concerning lesions.. Abdominal exam revealed normal bowel sounds. The abdomen was soft, non-tender, and without masses, organomegaly, or appreciable enlargement of the abdominal aorta..   Peg  placement Psychiatric system: Depression and anxiety  Filed Vitals:   09/06/15 1028  BP: 102/73  Pulse: 71  Temp: 96.2 F (35.7 C)     Body mass index is 15.9 kg/(m^2).    ECOG FS:1 - Symptomatic but completely ambulatory  LAB RESULTS:  Appointment on 09/06/2015  Component Date Value Ref Range Status  . WBC 09/06/2015 11.2* 3.8 - 10.6 K/uL Final   A-LINE DRAW  . RBC 09/06/2015 3.84* 4.40 - 5.90 MIL/uL Final  . Hemoglobin 09/06/2015 11.8* 13.0 - 18.0 g/dL Final  . HCT 09/06/2015 35.8* 40.0 - 52.0 % Final  . MCV 09/06/2015 93.3  80.0 - 100.0 fL Final  . MCH 09/06/2015 30.6  26.0 - 34.0 pg Final  . MCHC 09/06/2015 32.8  32.0 - 36.0 g/dL Final  . RDW 09/06/2015 15.1* 11.5 - 14.5 % Final  . Platelets 09/06/2015 375  150 - 440 K/uL Final  . Neutrophils Relative % 09/06/2015 76   Final  . Neutro Abs 09/06/2015 8.6* 1.4 - 6.5 K/uL Final  . Lymphocytes Relative 09/06/2015 17   Final  . Lymphs Abs 09/06/2015  1.9  1.0 - 3.6 K/uL Final  . Monocytes Relative 09/06/2015 7   Final  . Monocytes Absolute 09/06/2015 0.8  0.2 - 1.0 K/uL Final  . Eosinophils Relative 09/06/2015 0   Final  . Eosinophils Absolute 09/06/2015 0.0  0 - 0.7 K/uL Final  . Basophils Relative 09/06/2015 0   Final  . Basophils Absolute 09/06/2015 0.0  0 - 0.1 K/uL Final  . Sodium 09/06/2015 132* 135 - 145 mmol/L Final  . Potassium 09/06/2015 3.7  3.5 - 5.1 mmol/L Final  . Chloride 09/06/2015 101  101 - 111 mmol/L Final  . CO2 09/06/2015 25  22 - 32 mmol/L Final  . Glucose, Bld 09/06/2015 117* 65 - 99 mg/dL Final  . BUN 09/06/2015 13  6 - 20 mg/dL Final  . Creatinine, Ser 09/06/2015 0.65  0.61 - 1.24 mg/dL Final  . Calcium 09/06/2015 7.9* 8.9 - 10.3 mg/dL Final  . Total Protein 09/06/2015 5.8* 6.5 - 8.1 g/dL Final  . Albumin 09/06/2015 2.9* 3.5 - 5.0 g/dL Final  . AST 09/06/2015 19  15 - 41 U/L Final  . ALT 09/06/2015 10* 17 - 63 U/L Final  . Alkaline Phosphatase 09/06/2015 51  38 - 126 U/L Final  . Total Bilirubin 09/06/2015 0.6  0.3 - 1.2 mg/dL Final  . GFR calc non Af Amer 09/06/2015 >60  >60 mL/min Final  . GFR calc Af Amer 09/06/2015 >60  >60 mL/min Final   Comment: (NOTE) The eGFR has been calculated using the CKD EPI equation. This calculation has not been validated in all clinical situations. eGFR's persistently <60 mL/min signify possible Chronic Kidney Disease.   . Anion gap 09/06/2015 6  5 - 15 Final  . Magnesium 09/06/2015 2.2  1.7 - 2.4 mg/dL Final       ASSESSMENT:  A. 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  Carcinoma of tongue in 2006 status post resection and radiation therapy exit staging not known and old records being off pain for review  3, significant weight loss 4, previous history of chronic drug abuse.  Chronic tobacco  abuse.    PLAN:   tolerating oral diet very well. Did not getting enough calories and continues to lose weight I have advised patient to start half a can of Jevity at least twice a day and if tolerated well progressed to half a can 4 times a day and gradually increase Will continue IV fluid Continue cetuximab Continue radiation therapy  Has not lost significant weight since starting treatment Total duration of visit was 20mnutes.  50% or more time was spent in counseling patient and family regarding prognosis and options of treatment and available resources May consider again getting dietitian consult  chemotherapy with cetuximab and in between and hydration.       No matching staging information was found for the patient.  JForest Gleason MD   09/06/2015 11:04 AM

## 2015-09-07 ENCOUNTER — Telehealth: Payer: Self-pay | Admitting: Dietician

## 2015-09-07 ENCOUNTER — Ambulatory Visit
Admission: RE | Admit: 2015-09-07 | Discharge: 2015-09-07 | Disposition: A | Discharge status: Home / Self Care | Payer: Medicare Other | Source: Ambulatory Visit | Attending: Radiation Oncology | Admitting: Radiation Oncology

## 2015-09-07 ENCOUNTER — Ambulatory Visit: Payer: Medicare Other

## 2015-09-07 DIAGNOSIS — C01 Malignant neoplasm of base of tongue: Secondary | ICD-10-CM | POA: Diagnosis not present

## 2015-09-07 DIAGNOSIS — Z510 Encounter for antineoplastic radiation therapy: Secondary | ICD-10-CM | POA: Diagnosis not present

## 2015-09-07 DIAGNOSIS — C029 Malignant neoplasm of tongue, unspecified: Secondary | ICD-10-CM | POA: Diagnosis not present

## 2015-09-07 DIAGNOSIS — Z87891 Personal history of nicotine dependence: Secondary | ICD-10-CM | POA: Diagnosis not present

## 2015-09-07 NOTE — Telephone Encounter (Signed)
Spoke with patient's wife who stated that they will be coming Monday for and RD appointment.  She reports the Jevity supplement started to cause nausea and vomiting and diarrhea, so he is no longer taking that, but doing well currently with Muscle milk and other protein drinks. He has lost some additional weight since previous RD visit, but is now doing better.

## 2015-09-09 ENCOUNTER — Ambulatory Visit
Admission: RE | Admit: 2015-09-09 | Discharge: 2015-09-09 | Disposition: A | Discharge status: Home / Self Care | Payer: Medicare Other | Source: Ambulatory Visit | Attending: Radiation Oncology | Admitting: Radiation Oncology

## 2015-09-10 ENCOUNTER — Ambulatory Visit: Payer: Medicare Other

## 2015-09-10 ENCOUNTER — Ambulatory Visit
Admission: RE | Admit: 2015-09-10 | Discharge: 2015-09-10 | Disposition: A | Discharge status: Home / Self Care | Payer: Medicare Other | Source: Ambulatory Visit | Attending: Radiation Oncology | Admitting: Radiation Oncology

## 2015-09-10 ENCOUNTER — Inpatient Hospital Stay: Payer: Medicare Other

## 2015-09-10 ENCOUNTER — Ambulatory Visit: Payer: Medicare Other | Admitting: Dietician

## 2015-09-10 VITALS — BP 114/72 | HR 60 | Temp 96.3°F | Resp 20

## 2015-09-10 DIAGNOSIS — Z85810 Personal history of malignant neoplasm of tongue: Secondary | ICD-10-CM | POA: Diagnosis not present

## 2015-09-10 DIAGNOSIS — I10 Essential (primary) hypertension: Secondary | ICD-10-CM | POA: Diagnosis not present

## 2015-09-10 DIAGNOSIS — R112 Nausea with vomiting, unspecified: Secondary | ICD-10-CM | POA: Diagnosis not present

## 2015-09-10 DIAGNOSIS — Z510 Encounter for antineoplastic radiation therapy: Secondary | ICD-10-CM | POA: Diagnosis not present

## 2015-09-10 DIAGNOSIS — R634 Abnormal weight loss: Secondary | ICD-10-CM | POA: Diagnosis not present

## 2015-09-10 DIAGNOSIS — R197 Diarrhea, unspecified: Secondary | ICD-10-CM | POA: Diagnosis not present

## 2015-09-10 DIAGNOSIS — E7800 Pure hypercholesterolemia, unspecified: Secondary | ICD-10-CM | POA: Diagnosis not present

## 2015-09-10 DIAGNOSIS — Z79899 Other long term (current) drug therapy: Secondary | ICD-10-CM | POA: Diagnosis not present

## 2015-09-10 DIAGNOSIS — C029 Malignant neoplasm of tongue, unspecified: Secondary | ICD-10-CM | POA: Diagnosis not present

## 2015-09-10 DIAGNOSIS — Z931 Gastrostomy status: Secondary | ICD-10-CM | POA: Diagnosis not present

## 2015-09-10 DIAGNOSIS — K219 Gastro-esophageal reflux disease without esophagitis: Secondary | ICD-10-CM | POA: Diagnosis not present

## 2015-09-10 DIAGNOSIS — R1310 Dysphagia, unspecified: Secondary | ICD-10-CM | POA: Diagnosis not present

## 2015-09-10 DIAGNOSIS — E860 Dehydration: Secondary | ICD-10-CM | POA: Diagnosis not present

## 2015-09-10 DIAGNOSIS — K259 Gastric ulcer, unspecified as acute or chronic, without hemorrhage or perforation: Secondary | ICD-10-CM | POA: Diagnosis not present

## 2015-09-10 DIAGNOSIS — Z5111 Encounter for antineoplastic chemotherapy: Secondary | ICD-10-CM | POA: Diagnosis not present

## 2015-09-10 DIAGNOSIS — C01 Malignant neoplasm of base of tongue: Secondary | ICD-10-CM | POA: Diagnosis not present

## 2015-09-10 DIAGNOSIS — Z87891 Personal history of nicotine dependence: Secondary | ICD-10-CM | POA: Diagnosis not present

## 2015-09-10 MED ORDER — SODIUM CHLORIDE 0.9 % IV SOLN
Freq: Once | INTRAVENOUS | Status: AC
  Administered 2015-09-10: 10:00:00 via INTRAVENOUS
  Filled 2015-09-10: qty 2

## 2015-09-10 MED ORDER — HEPARIN SOD (PORK) LOCK FLUSH 100 UNIT/ML IV SOLN
500.0000 [IU] | Freq: Once | INTRAVENOUS | Status: AC | PRN
  Administered 2015-09-10: 500 [IU]
  Filled 2015-09-10: qty 5

## 2015-09-10 MED ORDER — SODIUM CHLORIDE 0.9 % IJ SOLN
10.0000 mL | INTRAMUSCULAR | Status: DC | PRN
  Administered 2015-09-10: 10 mL
  Filled 2015-09-10: qty 10

## 2015-09-10 MED ORDER — SODIUM CHLORIDE 0.9 % IV SOLN
Freq: Once | INTRAVENOUS | Status: AC
  Administered 2015-09-10: 10:00:00 via INTRAVENOUS
  Filled 2015-09-10: qty 1000

## 2015-09-11 ENCOUNTER — Ambulatory Visit: Payer: Medicare Other

## 2015-09-11 ENCOUNTER — Telehealth: Payer: Self-pay | Admitting: Dietician

## 2015-09-11 NOTE — Telephone Encounter (Signed)
Left voicemail message in response to call from Tacy Dura yesterday to cancel yesterday's appointment (Patient not feeling well). Stated in message that Mr. Levels could definitely reschedule appointment, or could delay rescheduling at this time if his weight has stabilized.  Requested a call back.

## 2015-09-12 ENCOUNTER — Ambulatory Visit
Admission: RE | Admit: 2015-09-12 | Discharge: 2015-09-12 | Disposition: A | Discharge status: Home / Self Care | Payer: Medicare Other | Source: Ambulatory Visit | Attending: Radiation Oncology | Admitting: Radiation Oncology

## 2015-09-12 ENCOUNTER — Telehealth: Payer: Self-pay | Admitting: *Deleted

## 2015-09-12 ENCOUNTER — Ambulatory Visit: Payer: Medicare Other

## 2015-09-12 DIAGNOSIS — Z510 Encounter for antineoplastic radiation therapy: Secondary | ICD-10-CM | POA: Diagnosis not present

## 2015-09-12 DIAGNOSIS — C029 Malignant neoplasm of tongue, unspecified: Secondary | ICD-10-CM

## 2015-09-12 DIAGNOSIS — Z87891 Personal history of nicotine dependence: Secondary | ICD-10-CM | POA: Diagnosis not present

## 2015-09-12 DIAGNOSIS — C01 Malignant neoplasm of base of tongue: Secondary | ICD-10-CM | POA: Diagnosis not present

## 2015-09-12 MED ORDER — OXYCODONE HCL 30 MG PO TABS: ORAL_TABLET | ORAL | Status: DC

## 2015-09-12 NOTE — Telephone Encounter (Signed)
Pain med refill placed behind registration desk for pick-up.

## 2015-09-13 ENCOUNTER — Ambulatory Visit: Payer: Medicare Other

## 2015-09-13 ENCOUNTER — Inpatient Hospital Stay: Payer: Medicare Other

## 2015-09-13 ENCOUNTER — Inpatient Hospital Stay: Payer: Medicare Other | Attending: Oncology

## 2015-09-13 ENCOUNTER — Encounter: Payer: Self-pay | Admitting: Oncology

## 2015-09-13 ENCOUNTER — Inpatient Hospital Stay (HOSPITAL_BASED_OUTPATIENT_CLINIC_OR_DEPARTMENT_OTHER): Payer: Medicare Other | Admitting: Oncology

## 2015-09-13 VITALS — BP 112/77 | HR 59 | Temp 95.7°F | Wt 120.2 lb

## 2015-09-13 VITALS — BP 115/78 | HR 72 | Temp 96.3°F | Resp 20

## 2015-09-13 DIAGNOSIS — G8929 Other chronic pain: Secondary | ICD-10-CM | POA: Diagnosis not present

## 2015-09-13 DIAGNOSIS — C029 Malignant neoplasm of tongue, unspecified: Secondary | ICD-10-CM

## 2015-09-13 DIAGNOSIS — Z5111 Encounter for antineoplastic chemotherapy: Secondary | ICD-10-CM | POA: Insufficient documentation

## 2015-09-13 DIAGNOSIS — E7800 Pure hypercholesterolemia, unspecified: Secondary | ICD-10-CM | POA: Diagnosis not present

## 2015-09-13 DIAGNOSIS — R634 Abnormal weight loss: Secondary | ICD-10-CM | POA: Insufficient documentation

## 2015-09-13 DIAGNOSIS — Z87891 Personal history of nicotine dependence: Secondary | ICD-10-CM | POA: Insufficient documentation

## 2015-09-13 DIAGNOSIS — M549 Dorsalgia, unspecified: Secondary | ICD-10-CM | POA: Insufficient documentation

## 2015-09-13 DIAGNOSIS — Z85810 Personal history of malignant neoplasm of tongue: Secondary | ICD-10-CM | POA: Insufficient documentation

## 2015-09-13 DIAGNOSIS — I10 Essential (primary) hypertension: Secondary | ICD-10-CM | POA: Diagnosis not present

## 2015-09-13 DIAGNOSIS — K121 Other forms of stomatitis: Secondary | ICD-10-CM | POA: Diagnosis not present

## 2015-09-13 DIAGNOSIS — R1310 Dysphagia, unspecified: Secondary | ICD-10-CM | POA: Diagnosis not present

## 2015-09-13 DIAGNOSIS — K219 Gastro-esophageal reflux disease without esophagitis: Secondary | ICD-10-CM | POA: Diagnosis not present

## 2015-09-13 DIAGNOSIS — Z79899 Other long term (current) drug therapy: Secondary | ICD-10-CM

## 2015-09-13 LAB — CBC WITH DIFFERENTIAL/PLATELET
Basophils Absolute: 0 10*3/uL (ref 0–0.1)
Basophils Relative: 0 %
EOS ABS: 0 10*3/uL (ref 0–0.7)
EOS PCT: 0 %
HCT: 35.9 % — ABNORMAL LOW (ref 40.0–52.0)
Hemoglobin: 12 g/dL — ABNORMAL LOW (ref 13.0–18.0)
Lymphocytes Relative: 7 %
Lymphs Abs: 0.8 10*3/uL — ABNORMAL LOW (ref 1.0–3.6)
MCH: 31.4 pg (ref 26.0–34.0)
MCHC: 33.5 g/dL (ref 32.0–36.0)
MCV: 93.6 fL (ref 80.0–100.0)
MONO ABS: 1 10*3/uL (ref 0.2–1.0)
MONOS PCT: 8 %
Neutro Abs: 10.8 10*3/uL — ABNORMAL HIGH (ref 1.4–6.5)
Neutrophils Relative %: 85 %
PLATELETS: 315 10*3/uL (ref 150–440)
RBC: 3.84 MIL/uL — ABNORMAL LOW (ref 4.40–5.90)
RDW: 16.7 % — AB (ref 11.5–14.5)
WBC: 12.6 10*3/uL — AB (ref 3.8–10.6)

## 2015-09-13 LAB — COMPREHENSIVE METABOLIC PANEL
ALK PHOS: 50 U/L (ref 38–126)
ALT: 13 U/L — AB (ref 17–63)
AST: 22 U/L (ref 15–41)
Albumin: 2.9 g/dL — ABNORMAL LOW (ref 3.5–5.0)
Anion gap: 8 (ref 5–15)
BUN: 25 mg/dL — AB (ref 6–20)
CALCIUM: 7.9 mg/dL — AB (ref 8.9–10.3)
CO2: 23 mmol/L (ref 22–32)
CREATININE: 0.65 mg/dL (ref 0.61–1.24)
Chloride: 101 mmol/L (ref 101–111)
GFR calc non Af Amer: >60 mL/min (ref >60)
GLUCOSE: 177 mg/dL — AB (ref 65–99)
Potassium: 3.9 mmol/L (ref 3.5–5.1)
SODIUM: 132 mmol/L — AB (ref 135–145)
Total Bilirubin: 0.6 mg/dL (ref 0.3–1.2)
Total Protein: 5.9 g/dL — ABNORMAL LOW (ref 6.5–8.1)

## 2015-09-13 LAB — MAGNESIUM: Magnesium: 2.1 mg/dL (ref 1.7–2.4)

## 2015-09-13 MED ORDER — DIPHENHYDRAMINE HCL 50 MG/ML IJ SOLN
50.0000 mg | Freq: Once | INTRAMUSCULAR | Status: AC
  Administered 2015-09-13: 50 mg via INTRAVENOUS
  Filled 2015-09-13: qty 1

## 2015-09-13 MED ORDER — SODIUM CHLORIDE 0.9 % IV SOLN
Freq: Once | INTRAVENOUS | Status: AC
  Administered 2015-09-13: 10:00:00 via INTRAVENOUS
  Filled 2015-09-13: qty 1000

## 2015-09-13 MED ORDER — CETUXIMAB CHEMO IV INJECTION 200 MG/100ML
250.0000 mg/m2 | Freq: Once | INTRAVENOUS | Status: AC
  Administered 2015-09-13: 400 mg via INTRAVENOUS
  Filled 2015-09-13: qty 200

## 2015-09-13 MED ORDER — HEPARIN SOD (PORK) LOCK FLUSH 100 UNIT/ML IV SOLN
500.0000 [IU] | Freq: Once | INTRAVENOUS | Status: AC | PRN
  Administered 2015-09-13: 500 [IU]
  Filled 2015-09-13: qty 5

## 2015-09-13 MED ORDER — HYDROCORTISONE NA SUCCINATE PF 100 MG IJ SOLR
100.0000 mg | Freq: Once | INTRAMUSCULAR | Status: AC
  Administered 2015-09-13: 100 mg via INTRAVENOUS
  Filled 2015-09-13: qty 2

## 2015-09-13 NOTE — Progress Notes (Signed)
Patient states he is constipated and has to strain a lot.  His feeding tube looks like it is coming out.

## 2015-09-14 ENCOUNTER — Ambulatory Visit
Admission: RE | Admit: 2015-09-14 | Discharge: 2015-09-14 | Disposition: A | Discharge status: Home / Self Care | Payer: Medicare Other | Source: Ambulatory Visit | Attending: Radiation Oncology | Admitting: Radiation Oncology

## 2015-09-14 ENCOUNTER — Encounter: Payer: Self-pay | Admitting: Oncology

## 2015-09-14 DIAGNOSIS — C029 Malignant neoplasm of tongue, unspecified: Secondary | ICD-10-CM | POA: Diagnosis not present

## 2015-09-14 DIAGNOSIS — Z87891 Personal history of nicotine dependence: Secondary | ICD-10-CM | POA: Diagnosis not present

## 2015-09-14 DIAGNOSIS — Z510 Encounter for antineoplastic radiation therapy: Secondary | ICD-10-CM | POA: Diagnosis not present

## 2015-09-14 DIAGNOSIS — C01 Malignant neoplasm of base of tongue: Secondary | ICD-10-CM | POA: Diagnosis not present

## 2015-09-14 NOTE — Progress Notes (Signed)
[place] @ [facility] Telephone:(336) [phone]  Fax:(336) [place] OB: [birth year]  MR#: [id number]  UUV#:[id number]  Patient Care Team: Cyndi Bender, PA-C as PCP - General (Physician Assistant) Beverly Gust, MD (Unknown Physician Specialty)  CHIEF COMPLAINT:  Chief Complaint  Patient presents with  . OTHER   1.  Has a history of cancer of the tongue in 2006.  Patient underwent resection followed by radiation therapy 2.  Increasing difficulty in swallowing for last 2 or 3 weeks.  Patient had upper endoscopy done in December of 2015. 3.  Significant weight loss 4.  Abnormal PET scan (August of 2016) 5.  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  (Cis-platinum and radiation therapy) (September 2 016) T3 N0 M0 tumor Status post PEG tube placement (September, 2016) 6. Poor tolerance dose cis-platinum therapy, so patient has been switched over to cetuximab along with radiation therapy (October, 2016)  VISIT DIAGNOSIS:  Significant weight loss. Difficulty swallowing. History of carcinoma of tongue    Oncology Flowsheet 08/22/2015 08/27/2015 08/30/2015 09/03/2015 09/06/2015 09/10/2015 09/13/2015  Day, Cycle - - - - - - -  cetuximab (ERBITUX) IV 400 mg/m2 - 250 mg/m2 - 400 mg - 250 mg/m2  CISplatin (PLATINOL) IV - - - - - - -  dexamethasone (DECADRON) IJ - - - - - - -  dexamethasone (DECADRON) IV 4 mg [ 4 mg ] - [ 4 mg ] - [ 4 mg ] -  enoxaparin (LOVENOX) [place] - - - - - - -  fosaprepitant (EMEND) IV - - - - - - -  granisetron (KYTRIL) PO - - - - - - -  LORazepam (ATIVAN) IV - - - - - - -  metoCLOPramide (REGLAN) IV - - - - - - -  ondansetron (ZOFRAN) IV - [ 4 mg ] - [ 4 mg ] - [ 4 mg ] -  palonosetron (ALOXI) IV - - - - - - -  promethazine (PHENERGAN) IM - - - - - - -  promethazine (PHENERGAN) IV - - - - - - -    INTERVAL HISTORY:  Patient has been evaluated by  gastroenterologist.  Patient still has a PEG tube but does not get any feeding through it. Previously was getting sick and throwing up Continues to lose weight.  Tolerated cetuximab after starting small dose of Solu cortef  Pain has improved.  There is soreness in the throat but continues to swallow better.  Here for further follow-up and treatment consideration Patient has pulled the PEG tube out. As constipated had one being episode of diarrhea. Appetite is improving no difficulty swallowing pain is under better control Because of decreasing patient is getting intermittent fluid and steroid REVIEW OF SYSTEMS:   Gen. status: Patient is feeling weak and tired.  Has lost significant weight Poor appetite patient is very depressed.  Losing weight. Increasing difficulty swallowing as mentioned in history of present illness HEENT: As mentioned in history of present illness patient had history of carcinoma of tongue status post radiation therapy recently having increasing soreness in the mouth. Lungs: Increasing cough shortness of breath yellowish expectoration no fever no hemoptysis GI: Persistent nausea as described above in history of present illness [place] close skeletal system no bony pain Lower extremity no swelling Skin: No rash Abdomen: Had a PEG tube placement  As per HPI. Otherwise, a complete review of systems is negatve.  PAST MEDICAL HISTORY: Carcinoma of tongue Hypertension Hypercholesterolemia Previous substance abuse Gastroesophageal reflux disease  PAST SURGICAL HISTORY: Treated for carcinoma of tongue FAMILY HISTORY There is no significant family history of breast cancer, ovarian cancer, colon cancer    ADVANCED DIRECTIVES:  Patient does not have any living will or healthcare power of attorney.  Information was given .  Available resources had been discussed.  We will follow-up on subsequent appointments regarding this issue  HEALTH MAINTENANCE: Social History    Substance Use Topics  . Smoking status: Former Smoker -- 1.00 packs/day for 0 years    Types: Cigarettes    Quit date: 09/21/2014  . Smokeless tobacco: None  . Alcohol Use: 7.2 oz/week    12 Cans of beer per week     Comment: beer/wine every day    Quit smoking in November of 2015.  History of smoking for several years in the past.  No Known Allergies  Current Outpatient Prescriptions  Medication Sig Dispense Refill  . acidophilus (RISAQUAD) CAPS capsule Take 1 capsule by mouth daily.    . chlorhexidine (PERIDEX) 0.12 % solution 15 mLs by Mouth Rinse route 2 (two) times daily. 120 mL 0  . citalopram (CELEXA) 20 MG tablet Take 1 tablet (20 mg total) by mouth daily. 30 tablet 3  . dexamethasone (DECADRON) 4 MG tablet Take 1 tablet (4 mg total) by mouth 2 (two) times daily with a meal. 50 tablet 0  . fentaNYL (DURAGESIC - DOSED MCG/HR) 50 MCG/HR Place 1 patch (50 mcg total) onto the skin every 3 (three) days. 10 patch 0  . lidocaine-prilocaine (EMLA) cream Apply 1 application topically as needed. (Patient taking differently: Apply 1 application topically as needed (prior to accessing port). ) 30 g 3  . lisinopril (PRINIVIL,ZESTRIL) 10 MG tablet Take 10 mg by mouth daily.    . ondansetron (ZOFRAN) 4 MG tablet Take 1 tablet (4 mg total) by mouth every 4 (four) hours as needed for nausea or vomiting. 45 tablet 0  . oxycodone (ROXICODONE) 30 MG immediate release tablet Take 1 tablet PO 7 times daily PRN for pain 60 tablet 0  . pantoprazole (PROTONIX) 40 MG tablet Take 1 tablet (40 mg total) by mouth 2 (two) times daily. 60 tablet 3  . phenol (CHLORASEPTIC) 1.4 % LIQD Use as directed 1 spray in the mouth or throat as needed for throat irritation / pain. 177 mL 0  . pravastatin (PRAVACHOL) 40 MG tablet Take 40 mg by mouth daily.    . promethazine (PHENERGAN) 25 MG suppository Place 1 suppository (25 mg total) rectally every 8 (eight) hours as needed for nausea or vomiting. 12 each 0  .  Saccharomyces boulardii (FLORASTOR PO) Take by mouth.    . sucralfate (CARAFATE) 1 GM/10ML suspension Take 10 mLs (1 g total) by mouth 4 (four) times daily -  with meals and at bedtime. 420 mL 0  . traZODone (DESYREL) 100 MG tablet Take 100 mg by mouth at bedtime.    Marland Kitchen nystatin (MYCOSTATIN) 100000 UNIT/ML suspension      No current facility-administered medications for this visit.   Facility-Administered Medications Ordered in Other Visits  Medication Dose Route Frequency Provider Last Rate Last Dose  . sodium chloride 0.9 % injection 10 mL  10 mL Intracatheter PRN Evlyn Kanner, NP   10 mL at 09/10/15 0948    OBJECTIVE: PHYSICAL EXAM: Gen. status: Patient is seen lean and cachectic. HEENT: No soreness in the mouth.  But swelling  which is diffuse Lymphatic system: Supraclavicular, cervical, axillary, inguinal lymph nodes are not palpable Lungs: Bilateral rhonchi and occasional crepitation. Cardiac: Tachycardia Examination of the skin revealed no evidence of significant rashes, suspicious appearing nevi or other concerning lesions.. Abdominal exam revealed normal bowel sounds. The abdomen was soft, non-tender, and without masses, organomegaly, or appreciable enlargement of the abdominal aorta..   Peg  placement Psychiatric system: Depression and anxiety  Filed Vitals:   09/13/15 0911  BP: 112/77  Pulse: 59  Temp: 95.7 F (35.4 C)     Body mass index is 15.42 kg/(m^2).    ECOG FS:1 - Symptomatic but completely ambulatory  LAB RESULTS:  Infusion on 09/13/2015  Component Date Value Ref Range Status  . Sodium 09/13/2015 132* 135 - 145 mmol/L Final  . Potassium 09/13/2015 3.9  3.5 - 5.1 mmol/L Final  . Chloride 09/13/2015 101  101 - 111 mmol/L Final  . CO2 09/13/2015 23  22 - 32 mmol/L Final  . Glucose, Bld 09/13/2015 177* 65 - 99 mg/dL Final  . BUN 09/13/2015 25* 6 - 20 mg/dL Final  . Creatinine, Ser 09/13/2015 0.65  0.61 - 1.24 mg/dL Final  . Calcium 09/13/2015 7.9* 8.9 -  10.3 mg/dL Final  . Total Protein 09/13/2015 5.9* 6.5 - 8.1 g/dL Final  . Albumin 09/13/2015 2.9* 3.5 - 5.0 g/dL Final  . AST 09/13/2015 22  15 - 41 U/L Final  . ALT 09/13/2015 13* 17 - 63 U/L Final  . Alkaline Phosphatase 09/13/2015 50  38 - 126 U/L Final  . Total Bilirubin 09/13/2015 0.6  0.3 - 1.2 mg/dL Final  . GFR calc non Af Amer 09/13/2015 >60  >60 mL/min Final  . GFR calc Af Amer 09/13/2015 >60  >60 mL/min Final   Comment: (NOTE) The eGFR has been calculated using the CKD EPI equation. This calculation has not been validated in all clinical situations. eGFR's persistently <60 mL/min signify possible Chronic Kidney Disease.   . Anion gap 09/13/2015 8  5 - 15 Final  . Magnesium 09/13/2015 2.1  1.7 - 2.4 mg/dL Final  . WBC 09/13/2015 12.6* 3.8 - 10.6 K/uL Final  . RBC 09/13/2015 3.84* 4.40 - 5.90 MIL/uL Final  . Hemoglobin 09/13/2015 12.0* 13.0 - 18.0 g/dL Final  . HCT 09/13/2015 35.9* 40.0 - 52.0 % Final  . MCV 09/13/2015 93.6  80.0 - 100.0 fL Final  . MCH 09/13/2015 31.4  26.0 - 34.0 pg Final  . MCHC 09/13/2015 33.5  32.0 - 36.0 g/dL Final  . RDW 09/13/2015 16.7* 11.5 - 14.5 % Final  . Platelets 09/13/2015 315  150 - 440 K/uL Final  . Neutrophils Relative % 09/13/2015 85   Final  . Neutro Abs 09/13/2015 10.8* 1.4 - 6.5 K/uL Final  . Lymphocytes Relative 09/13/2015 7   Final  . Lymphs Abs 09/13/2015 0.8* 1.0 - 3.6 K/uL Final  . Monocytes Relative 09/13/2015 8   Final  . Monocytes Absolute 09/13/2015 1.0  0.2 - 1.0 K/uL Final  . Eosinophils Relative 09/13/2015 0   Final  . Eosinophils Absolute 09/13/2015 0.0  0 - 0.7 K/uL Final  . Basophils Relative 09/13/2015 0   Final  . Basophils Absolute 09/13/2015 0.0  0 - 0.1 K/uL Final       ASSESSMENT:  A. 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  Carcinoma  of tongue in  2006 status post resection and radiation therapy exit staging not known and old records being off pain for review  3, significant weight loss 4, previous history of chronic drug abuse.  Chronic tobacco abuse.    PLAN:   Continue chemotherapy this is a last cetuxamab  Patient has pulled her PEG tube out which has been removed and the whole was cleaned.  Gastroenterology Department was contacted for further management.  Patient does not get any feeding through PEG tube so at this point in time I do not see any need to replacing it as patient's overall swallowing is improved. Management of PET tube . continuation of chemotherapy Reevaluation in 4 weeks for possibility of repeating PET scan for restaging Objective thyroid function  Lab data has been reviewed        No matching staging information was found for the patient.  Forest Gleason, MD   09/14/2015 9:04 AM

## 2015-09-17 ENCOUNTER — Other Ambulatory Visit: Payer: Self-pay | Admitting: *Deleted

## 2015-09-17 ENCOUNTER — Ambulatory Visit: Payer: Medicare Other

## 2015-09-17 ENCOUNTER — Ambulatory Visit
Admission: RE | Admit: 2015-09-17 | Discharge: 2015-09-17 | Disposition: A | Discharge status: Home / Self Care | Payer: Medicare Other | Source: Ambulatory Visit | Attending: Radiation Oncology | Admitting: Radiation Oncology

## 2015-09-17 ENCOUNTER — Inpatient Hospital Stay: Payer: Medicare Other

## 2015-09-17 DIAGNOSIS — G8929 Other chronic pain: Secondary | ICD-10-CM | POA: Diagnosis not present

## 2015-09-17 DIAGNOSIS — Z5111 Encounter for antineoplastic chemotherapy: Secondary | ICD-10-CM | POA: Diagnosis not present

## 2015-09-17 DIAGNOSIS — K121 Other forms of stomatitis: Secondary | ICD-10-CM | POA: Diagnosis not present

## 2015-09-17 DIAGNOSIS — K219 Gastro-esophageal reflux disease without esophagitis: Secondary | ICD-10-CM | POA: Diagnosis not present

## 2015-09-17 DIAGNOSIS — R1310 Dysphagia, unspecified: Secondary | ICD-10-CM | POA: Diagnosis not present

## 2015-09-17 DIAGNOSIS — I10 Essential (primary) hypertension: Secondary | ICD-10-CM | POA: Diagnosis not present

## 2015-09-17 DIAGNOSIS — M549 Dorsalgia, unspecified: Secondary | ICD-10-CM | POA: Diagnosis not present

## 2015-09-17 DIAGNOSIS — C029 Malignant neoplasm of tongue, unspecified: Secondary | ICD-10-CM | POA: Diagnosis not present

## 2015-09-17 DIAGNOSIS — Z87891 Personal history of nicotine dependence: Secondary | ICD-10-CM | POA: Diagnosis not present

## 2015-09-17 DIAGNOSIS — Z85810 Personal history of malignant neoplasm of tongue: Secondary | ICD-10-CM | POA: Diagnosis not present

## 2015-09-17 DIAGNOSIS — Z79899 Other long term (current) drug therapy: Secondary | ICD-10-CM | POA: Diagnosis not present

## 2015-09-17 DIAGNOSIS — C01 Malignant neoplasm of base of tongue: Secondary | ICD-10-CM | POA: Diagnosis not present

## 2015-09-17 DIAGNOSIS — E7800 Pure hypercholesterolemia, unspecified: Secondary | ICD-10-CM | POA: Diagnosis not present

## 2015-09-17 DIAGNOSIS — Z510 Encounter for antineoplastic radiation therapy: Secondary | ICD-10-CM | POA: Diagnosis not present

## 2015-09-17 DIAGNOSIS — R634 Abnormal weight loss: Secondary | ICD-10-CM | POA: Diagnosis not present

## 2015-09-17 MED ORDER — SODIUM CHLORIDE 0.9 % IV SOLN
Freq: Once | INTRAVENOUS | Status: AC
  Administered 2015-09-17: 15:00:00 via INTRAVENOUS
  Filled 2015-09-17: qty 1000

## 2015-09-17 MED ORDER — DEXAMETHASONE 4 MG PO TABS: 4.0000 mg | ORAL_TABLET | Freq: Two times a day (BID) | ORAL | Status: DC

## 2015-09-17 MED ORDER — HEPARIN SOD (PORK) LOCK FLUSH 100 UNIT/ML IV SOLN: 500.0000 [IU] | Freq: Once | INTRAVENOUS | Status: DC | PRN

## 2015-09-17 MED ORDER — SODIUM CHLORIDE 0.9 % IV SOLN
Freq: Once | INTRAVENOUS | Status: AC
  Administered 2015-09-17: 15:00:00 via INTRAVENOUS
  Filled 2015-09-17: qty 2

## 2015-09-18 ENCOUNTER — Ambulatory Visit
Admission: RE | Admit: 2015-09-18 | Discharge: 2015-09-18 | Disposition: A | Discharge status: Home / Self Care | Payer: Medicare Other | Source: Ambulatory Visit | Attending: Radiation Oncology | Admitting: Radiation Oncology

## 2015-09-18 ENCOUNTER — Ambulatory Visit: Payer: Medicare Other

## 2015-09-18 DIAGNOSIS — Z87891 Personal history of nicotine dependence: Secondary | ICD-10-CM | POA: Diagnosis not present

## 2015-09-18 DIAGNOSIS — C01 Malignant neoplasm of base of tongue: Secondary | ICD-10-CM | POA: Diagnosis not present

## 2015-09-18 DIAGNOSIS — C029 Malignant neoplasm of tongue, unspecified: Secondary | ICD-10-CM | POA: Diagnosis not present

## 2015-09-18 DIAGNOSIS — Z510 Encounter for antineoplastic radiation therapy: Secondary | ICD-10-CM | POA: Diagnosis not present

## 2015-09-19 ENCOUNTER — Ambulatory Visit
Admission: RE | Admit: 2015-09-19 | Discharge: 2015-09-19 | Disposition: A | Discharge status: Home / Self Care | Payer: Medicare Other | Source: Ambulatory Visit | Attending: Radiation Oncology | Admitting: Radiation Oncology

## 2015-09-19 DIAGNOSIS — Z510 Encounter for antineoplastic radiation therapy: Secondary | ICD-10-CM | POA: Diagnosis not present

## 2015-09-19 DIAGNOSIS — Z87891 Personal history of nicotine dependence: Secondary | ICD-10-CM | POA: Diagnosis not present

## 2015-09-19 DIAGNOSIS — C01 Malignant neoplasm of base of tongue: Secondary | ICD-10-CM | POA: Diagnosis not present

## 2015-09-19 DIAGNOSIS — C029 Malignant neoplasm of tongue, unspecified: Secondary | ICD-10-CM | POA: Diagnosis not present

## 2015-09-20 ENCOUNTER — Ambulatory Visit
Admission: RE | Admit: 2015-09-20 | Discharge: 2015-09-20 | Disposition: A | Discharge status: Home / Self Care | Payer: Medicare Other | Source: Ambulatory Visit | Attending: Radiation Oncology | Admitting: Radiation Oncology

## 2015-09-20 ENCOUNTER — Ambulatory Visit: Payer: Medicare Other

## 2015-09-20 ENCOUNTER — Telehealth: Payer: Self-pay | Admitting: *Deleted

## 2015-09-20 DIAGNOSIS — C029 Malignant neoplasm of tongue, unspecified: Secondary | ICD-10-CM

## 2015-09-20 DIAGNOSIS — C01 Malignant neoplasm of base of tongue: Secondary | ICD-10-CM | POA: Diagnosis not present

## 2015-09-20 DIAGNOSIS — Z510 Encounter for antineoplastic radiation therapy: Secondary | ICD-10-CM | POA: Diagnosis not present

## 2015-09-20 DIAGNOSIS — Z87891 Personal history of nicotine dependence: Secondary | ICD-10-CM | POA: Diagnosis not present

## 2015-09-20 MED ORDER — OXYCODONE HCL 30 MG PO TABS: ORAL_TABLET | ORAL | Status: DC

## 2015-09-20 NOTE — Telephone Encounter (Signed)
Pt requests refill for oxycodone for 2 week supply. MD okay with #120. Rx given to patient after radiation treatment.

## 2015-09-21 ENCOUNTER — Ambulatory Visit: Payer: Medicare Other

## 2015-09-24 ENCOUNTER — Ambulatory Visit
Admission: RE | Admit: 2015-09-24 | Discharge: 2015-09-24 | Disposition: A | Discharge status: Home / Self Care | Payer: Medicare Other | Source: Ambulatory Visit | Attending: Radiation Oncology | Admitting: Radiation Oncology

## 2015-09-24 ENCOUNTER — Other Ambulatory Visit: Payer: Self-pay | Admitting: *Deleted

## 2015-09-24 ENCOUNTER — Ambulatory Visit: Payer: Medicare Other

## 2015-09-24 ENCOUNTER — Inpatient Hospital Stay: Payer: Medicare Other

## 2015-09-24 ENCOUNTER — Ambulatory Visit: Admission: RE | Admit: 2015-09-24 | Payer: Medicare Other | Source: Ambulatory Visit

## 2015-09-24 VITALS — BP 102/70 | HR 62 | Temp 97.2°F | Resp 18

## 2015-09-24 DIAGNOSIS — C029 Malignant neoplasm of tongue, unspecified: Secondary | ICD-10-CM

## 2015-09-24 DIAGNOSIS — Z79899 Other long term (current) drug therapy: Secondary | ICD-10-CM | POA: Diagnosis not present

## 2015-09-24 DIAGNOSIS — R1310 Dysphagia, unspecified: Secondary | ICD-10-CM | POA: Diagnosis not present

## 2015-09-24 DIAGNOSIS — Z5111 Encounter for antineoplastic chemotherapy: Secondary | ICD-10-CM | POA: Diagnosis not present

## 2015-09-24 DIAGNOSIS — R634 Abnormal weight loss: Secondary | ICD-10-CM | POA: Diagnosis not present

## 2015-09-24 DIAGNOSIS — Z85810 Personal history of malignant neoplasm of tongue: Secondary | ICD-10-CM | POA: Diagnosis not present

## 2015-09-24 DIAGNOSIS — Z87891 Personal history of nicotine dependence: Secondary | ICD-10-CM | POA: Diagnosis not present

## 2015-09-24 DIAGNOSIS — K219 Gastro-esophageal reflux disease without esophagitis: Secondary | ICD-10-CM | POA: Diagnosis not present

## 2015-09-24 DIAGNOSIS — K121 Other forms of stomatitis: Secondary | ICD-10-CM | POA: Diagnosis not present

## 2015-09-24 DIAGNOSIS — I10 Essential (primary) hypertension: Secondary | ICD-10-CM | POA: Diagnosis not present

## 2015-09-24 DIAGNOSIS — G8929 Other chronic pain: Secondary | ICD-10-CM | POA: Diagnosis not present

## 2015-09-24 DIAGNOSIS — M549 Dorsalgia, unspecified: Secondary | ICD-10-CM | POA: Diagnosis not present

## 2015-09-24 DIAGNOSIS — E7800 Pure hypercholesterolemia, unspecified: Secondary | ICD-10-CM | POA: Diagnosis not present

## 2015-09-24 MED ORDER — SODIUM CHLORIDE 0.9 % IV SOLN
Freq: Once | INTRAVENOUS | Status: AC
  Administered 2015-09-24: 11:00:00 via INTRAVENOUS
  Filled 2015-09-24: qty 0.4

## 2015-09-24 MED ORDER — SODIUM CHLORIDE 0.9 % IJ SOLN
10.0000 mL | INTRAMUSCULAR | Status: DC | PRN
  Administered 2015-09-24: 10 mL
  Filled 2015-09-24: qty 10

## 2015-09-24 MED ORDER — SODIUM CHLORIDE 0.9 % IV SOLN
Freq: Once | INTRAVENOUS | Status: AC
  Administered 2015-09-24: 11:00:00 via INTRAVENOUS
  Filled 2015-09-24: qty 1000

## 2015-09-24 MED ORDER — HEPARIN SOD (PORK) LOCK FLUSH 100 UNIT/ML IV SOLN
500.0000 [IU] | Freq: Once | INTRAVENOUS | Status: AC | PRN
  Administered 2015-09-24: 500 [IU]
  Filled 2015-09-24: qty 5

## 2015-09-25 ENCOUNTER — Ambulatory Visit: Payer: Medicare Other

## 2015-09-26 ENCOUNTER — Ambulatory Visit
Admission: RE | Admit: 2015-09-26 | Discharge: 2015-09-26 | Disposition: A | Discharge status: Home / Self Care | Payer: Medicare Other | Source: Ambulatory Visit | Attending: Radiation Oncology | Admitting: Radiation Oncology

## 2015-09-26 DIAGNOSIS — C01 Malignant neoplasm of base of tongue: Secondary | ICD-10-CM | POA: Diagnosis not present

## 2015-09-26 DIAGNOSIS — C029 Malignant neoplasm of tongue, unspecified: Secondary | ICD-10-CM | POA: Diagnosis not present

## 2015-09-26 DIAGNOSIS — Z510 Encounter for antineoplastic radiation therapy: Secondary | ICD-10-CM | POA: Diagnosis not present

## 2015-09-26 DIAGNOSIS — Z87891 Personal history of nicotine dependence: Secondary | ICD-10-CM | POA: Diagnosis not present

## 2015-09-27 ENCOUNTER — Inpatient Hospital Stay: Payer: Medicare Other

## 2015-09-27 ENCOUNTER — Encounter: Payer: Self-pay | Admitting: Oncology

## 2015-09-27 ENCOUNTER — Inpatient Hospital Stay (HOSPITAL_BASED_OUTPATIENT_CLINIC_OR_DEPARTMENT_OTHER): Payer: Medicare Other | Admitting: Oncology

## 2015-09-27 ENCOUNTER — Ambulatory Visit
Admission: RE | Admit: 2015-09-27 | Discharge: 2015-09-27 | Disposition: A | Discharge status: Home / Self Care | Payer: Medicare Other | Source: Ambulatory Visit | Attending: Radiation Oncology | Admitting: Radiation Oncology

## 2015-09-27 VITALS — BP 81/58 | HR 70 | Temp 95.5°F | Wt 115.3 lb

## 2015-09-27 DIAGNOSIS — Z87891 Personal history of nicotine dependence: Secondary | ICD-10-CM | POA: Diagnosis not present

## 2015-09-27 DIAGNOSIS — R634 Abnormal weight loss: Secondary | ICD-10-CM | POA: Diagnosis not present

## 2015-09-27 DIAGNOSIS — R1310 Dysphagia, unspecified: Secondary | ICD-10-CM | POA: Diagnosis not present

## 2015-09-27 DIAGNOSIS — Z5111 Encounter for antineoplastic chemotherapy: Secondary | ICD-10-CM | POA: Diagnosis not present

## 2015-09-27 DIAGNOSIS — Z85810 Personal history of malignant neoplasm of tongue: Secondary | ICD-10-CM | POA: Diagnosis not present

## 2015-09-27 DIAGNOSIS — C029 Malignant neoplasm of tongue, unspecified: Secondary | ICD-10-CM | POA: Diagnosis not present

## 2015-09-27 DIAGNOSIS — I10 Essential (primary) hypertension: Secondary | ICD-10-CM | POA: Diagnosis not present

## 2015-09-27 DIAGNOSIS — M549 Dorsalgia, unspecified: Secondary | ICD-10-CM

## 2015-09-27 DIAGNOSIS — Z79899 Other long term (current) drug therapy: Secondary | ICD-10-CM | POA: Diagnosis not present

## 2015-09-27 DIAGNOSIS — E7800 Pure hypercholesterolemia, unspecified: Secondary | ICD-10-CM | POA: Diagnosis not present

## 2015-09-27 DIAGNOSIS — K121 Other forms of stomatitis: Secondary | ICD-10-CM | POA: Diagnosis not present

## 2015-09-27 DIAGNOSIS — C01 Malignant neoplasm of base of tongue: Secondary | ICD-10-CM | POA: Diagnosis not present

## 2015-09-27 DIAGNOSIS — K219 Gastro-esophageal reflux disease without esophagitis: Secondary | ICD-10-CM | POA: Diagnosis not present

## 2015-09-27 DIAGNOSIS — G8929 Other chronic pain: Secondary | ICD-10-CM

## 2015-09-27 DIAGNOSIS — Z510 Encounter for antineoplastic radiation therapy: Secondary | ICD-10-CM | POA: Diagnosis not present

## 2015-09-27 LAB — COMPREHENSIVE METABOLIC PANEL
ALK PHOS: 52 U/L (ref 38–126)
ALT: 12 U/L — AB (ref 17–63)
AST: 18 U/L (ref 15–41)
Albumin: 2.9 g/dL — ABNORMAL LOW (ref 3.5–5.0)
Anion gap: 10 (ref 5–15)
BILIRUBIN TOTAL: 0.5 mg/dL (ref 0.3–1.2)
BUN: 26 mg/dL — AB (ref 6–20)
CALCIUM: 8.7 mg/dL — AB (ref 8.9–10.3)
CO2: 22 mmol/L (ref 22–32)
CREATININE: 0.6 mg/dL — AB (ref 0.61–1.24)
Chloride: 99 mmol/L — ABNORMAL LOW (ref 101–111)
GFR calc Af Amer: >60 mL/min (ref >60)
Glucose, Bld: 114 mg/dL — ABNORMAL HIGH (ref 65–99)
Potassium: 3.2 mmol/L — ABNORMAL LOW (ref 3.5–5.1)
Sodium: 131 mmol/L — ABNORMAL LOW (ref 135–145)
TOTAL PROTEIN: 6.3 g/dL — AB (ref 6.5–8.1)

## 2015-09-27 LAB — CBC WITH DIFFERENTIAL/PLATELET
Basophils Absolute: 0 10*3/uL (ref 0–0.1)
Basophils Relative: 0 %
Eosinophils Absolute: 0 10*3/uL (ref 0–0.7)
Eosinophils Relative: 0 %
HCT: 35.1 % — ABNORMAL LOW (ref 40.0–52.0)
HEMOGLOBIN: 11.8 g/dL — AB (ref 13.0–18.0)
LYMPHS ABS: 2.6 10*3/uL (ref 1.0–3.6)
LYMPHS PCT: 35 %
MCH: 31.4 pg (ref 26.0–34.0)
MCHC: 33.6 g/dL (ref 32.0–36.0)
MCV: 93.3 fL (ref 80.0–100.0)
Monocytes Absolute: 0.5 10*3/uL (ref 0.2–1.0)
Monocytes Relative: 7 %
NEUTROS PCT: 58 %
Neutro Abs: 4.3 10*3/uL (ref 1.4–6.5)
Platelets: 307 10*3/uL (ref 150–440)
RBC: 3.76 MIL/uL — AB (ref 4.40–5.90)
RDW: 17.5 % — ABNORMAL HIGH (ref 11.5–14.5)
WBC: 7.4 10*3/uL (ref 3.8–10.6)

## 2015-09-27 LAB — MAGNESIUM: MAGNESIUM: 1.3 mg/dL — AB (ref 1.7–2.4)

## 2015-09-27 MED ORDER — DEXAMETHASONE SODIUM PHOSPHATE 100 MG/10ML IJ SOLN
8.0000 mg | Freq: Once | INTRAMUSCULAR | Status: AC
  Administered 2015-09-27: 8 mg via INTRAVENOUS
  Filled 2015-09-27: qty 0.8

## 2015-09-27 MED ORDER — PREDNISONE 10 MG PO TABS: ORAL_TABLET | ORAL | Status: DC

## 2015-09-27 MED ORDER — SODIUM CHLORIDE 0.9 % IV SOLN
Freq: Once | INTRAVENOUS | Status: AC
  Administered 2015-09-27: 12:00:00 via INTRAVENOUS
  Filled 2015-09-27: qty 250

## 2015-09-27 MED ORDER — SODIUM CHLORIDE 0.9 % IJ SOLN
10.0000 mL | INTRAMUSCULAR | Status: DC | PRN
  Administered 2015-09-27: 10 mL via INTRAVENOUS
  Filled 2015-09-27: qty 10

## 2015-09-27 MED ORDER — HEPARIN SOD (PORK) LOCK FLUSH 100 UNIT/ML IV SOLN
500.0000 [IU] | Freq: Once | INTRAVENOUS | Status: AC
  Administered 2015-09-27: 500 [IU] via INTRAVENOUS
  Filled 2015-09-27: qty 5

## 2015-09-27 MED ORDER — SODIUM CHLORIDE 0.9 % IV SOLN
INTRAVENOUS | Status: DC
  Administered 2015-09-27: 12:00:00 via INTRAVENOUS
  Filled 2015-09-27: qty 1000

## 2015-09-27 NOTE — Progress Notes (Signed)
[place] @ [facility] Telephone:(336) [phone]  Fax:(336) [name]: September 03, 1956  MR#: [id number]  NOB#:[id number]  Patient Care Team: Cyndi Bender, PA-C as PCP - General (Physician Assistant) Beverly Gust, MD (Unknown Physician Specialty)  CHIEF COMPLAINT:  Chief Complaint  Patient presents with  . OTHER   1.  Has a history of cancer of the tongue in 2006.  Patient underwent resection followed by radiation therapy 2.  Increasing difficulty in swallowing for last 2 or 3 weeks.  Patient had upper endoscopy done in December of 2015. 3.  Significant weight loss 4.  Abnormal PET scan (August of 2016) 5.  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  (Cis-platinum and radiation therapy) (September 2 016) T3 N0 M0 tumor Status post PEG tube placement (September, 2016) 6. Poor tolerance dose cis-platinum therapy, so patient has been switched over to cetuximab along with radiation therapy (October, 2016) 7.  Patient has finished cetuximab as well as radiation therapy by 23rd of November 2 016  VISIT DIAGNOSIS:  Significant weight loss. Difficulty swallowing. History of carcinoma of tongue    Oncology Flowsheet 08/30/2015 09/03/2015 09/06/2015 09/10/2015 09/13/2015 09/17/2015 09/24/2015  Day, Cycle - - - - - - -  cetuximab (ERBITUX) IV 250 mg/m2 - 400 mg - 250 mg/m2 - -  CISplatin (PLATINOL) IV - - - - - - -  dexamethasone (DECADRON) IJ - - - - - - -  dexamethasone (DECADRON) IV - [ 4 mg ] - [ 4 mg ] - [ 4 mg ] [ 4 mg ]  enoxaparin (LOVENOX) [place] - - - - - - -  fosaprepitant (EMEND) IV - - - - - - -  granisetron (KYTRIL) PO - - - - - - -  LORazepam (ATIVAN) IV - - - - - - -  metoCLOPramide (REGLAN) IV - - - - - - -  ondansetron (ZOFRAN) IV - [ 4 mg ] - [ 4 mg ] - [ 4 mg ] -  palonosetron (ALOXI) IV - - - - - - -  promethazine (PHENERGAN) IM - - - - - - -  promethazine  (PHENERGAN) IV - - - - - - -    INTERVAL HISTORY:  Patient has been evaluated by gastroenterologist.  Patient still has a PEG tube but does not get any feeding through it. Previously was getting sick and throwing up Complaining of severe pain in the throat.  Patient is now a few more radiation therapy left.  Has increasing difficulty in swallowing. No chills.  No fever. This and also has chronic back pain dependent on oxycodone (previously prescribed by pain clinic physician close apparent PEG tube has been pulled out. REVIEW OF SYSTEMS:   Gen. status: Patient is feeling weak and tired.  Has lost significant weight Poor appetite patient is very depressed.  Losing weight. Increasing difficulty swallowing as mentioned in history of present illness HEENT: As mentioned in history of present illness patient had history of carcinoma of tongue status post radiation therapy recently having increasing soreness in the mouth. Lungs: Increasing cough shortness of breath yellowish expectoration no fever no hemoptysis GI: Persistent nausea as described above in history of present illness [place] close skeletal system no bony pain Lower extremity no swelling Skin: No rash Abdomen: Had a PEG tube placement  As per HPI. Otherwise, a complete review of systems is negatve.  PAST MEDICAL HISTORY:  Carcinoma of tongue Hypertension Hypercholesterolemia Previous substance abuse Gastroesophageal reflux disease  PAST SURGICAL HISTORY: Treated for carcinoma of tongue FAMILY HISTORY There is no significant family history of breast cancer, ovarian cancer, colon cancer    ADVANCED DIRECTIVES:  Patient does not have any living will or healthcare power of attorney.  Information was given .  Available resources had been discussed.  We will follow-up on subsequent appointments regarding this issue  HEALTH MAINTENANCE: Social History  Substance Use Topics  . Smoking status: Former Smoker -- 1.00 packs/day for 0  years    Types: Cigarettes    Quit date: 09/21/2014  . Smokeless tobacco: None  . Alcohol Use: 7.2 oz/week    12 Cans of beer per week     Comment: beer/wine every day    Quit smoking in November of 2015.  History of smoking for several years in the past.  No Known Allergies  Current Outpatient Prescriptions  Medication Sig Dispense Refill  . acidophilus (RISAQUAD) CAPS capsule Take 1 capsule by mouth daily.    . chlorhexidine (PERIDEX) 0.12 % solution 15 mLs by Mouth Rinse route 2 (two) times daily. 120 mL 0  . citalopram (CELEXA) 20 MG tablet Take 1 tablet (20 mg total) by mouth daily. 30 tablet 3  . dexamethasone (DECADRON) 4 MG tablet Take 1 tablet (4 mg total) by mouth 2 (two) times daily with a meal. 1 tablet BID through 11/14.  Take 1 tablet daily 11/15-11/21.  Then take 1 tablet every other day until finished. 21 tablet 0  . fentaNYL (DURAGESIC - DOSED MCG/HR) 50 MCG/HR Place 1 patch (50 mcg total) onto the skin every 3 (three) days. 10 patch 0  . lidocaine-prilocaine (EMLA) cream Apply 1 application topically as needed. (Patient taking differently: Apply 1 application topically as needed (prior to accessing port). ) 30 g 3  . lisinopril (PRINIVIL,ZESTRIL) 10 MG tablet Take 10 mg by mouth daily.    Marland Kitchen nystatin (MYCOSTATIN) 100000 UNIT/ML suspension     . ondansetron (ZOFRAN) 4 MG tablet Take 1 tablet (4 mg total) by mouth every 4 (four) hours as needed for nausea or vomiting. 45 tablet 0  . oxycodone (ROXICODONE) 30 MG immediate release tablet Take 1 tablet PO 7 times daily PRN for pain 120 tablet 0  . pantoprazole (PROTONIX) 40 MG tablet Take 1 tablet (40 mg total) by mouth 2 (two) times daily. 60 tablet 3  . phenol (CHLORASEPTIC) 1.4 % LIQD Use as directed 1 spray in the mouth or throat as needed for throat irritation / pain. 177 mL 0  . pravastatin (PRAVACHOL) 40 MG tablet Take 40 mg by mouth daily.    . promethazine (PHENERGAN) 25 MG suppository Place 1 suppository (25 mg  total) rectally every 8 (eight) hours as needed for nausea or vomiting. 12 each 0  . Saccharomyces boulardii (FLORASTOR PO) Take by mouth.    . sucralfate (CARAFATE) 1 GM/10ML suspension Take 10 mLs (1 g total) by mouth 4 (four) times daily -  with meals and at bedtime. 420 mL 0  . traZODone (DESYREL) 100 MG tablet Take 100 mg by mouth at bedtime.     No current facility-administered medications for this visit.   Facility-Administered Medications Ordered in Other Visits  Medication Dose Route Frequency Provider Last Rate Last Dose  . heparin lock flush 100 unit/mL  500 Units Intravenous Once Forest Gleason, MD      . sodium chloride 0.9 % injection 10 mL  10 mL  Intracatheter PRN Evlyn Kanner, NP   10 mL at 09/10/15 0948  . sodium chloride 0.9 % injection 10 mL  10 mL Intravenous PRN Forest Gleason, MD        OBJECTIVE: PHYSICAL EXAM: Gen. status: Patient is seen lean and cachectic. HEENT: No soreness in the mouth.  But swelling which is diffuse Lymphatic system: Supraclavicular, cervical, axillary, inguinal lymph nodes are not palpable Lungs: Bilateral rhonchi and occasional crepitation. Cardiac: Tachycardia Examination of the skin revealed no evidence of significant rashes, suspicious appearing nevi or other concerning lesions.. Abdominal exam revealed normal bowel sounds. The abdomen was soft, non-tender, and without masses, organomegaly, or appreciable enlargement of the abdominal aorta..   Peg  placement Psychiatric system: Depression and anxiety  Filed Vitals:   09/27/15 1051  BP: 81/58  Pulse: 70  Temp: 95.5 F (35.3 C)     Body mass index is 14.8 kg/(m^2).    ECOG FS:1 - Symptomatic but completely ambulatory  LAB RESULTS:  Infusion on 09/27/2015  Component Date Value Ref Range Status  . WBC 09/27/2015 7.4  3.8 - 10.6 K/uL Final  . RBC 09/27/2015 3.76* 4.40 - 5.90 MIL/uL Final  . Hemoglobin 09/27/2015 11.8* 13.0 - 18.0 g/dL Final  . HCT 09/27/2015 35.1* 40.0 - 52.0 %  Final  . MCV 09/27/2015 93.3  80.0 - 100.0 fL Final  . MCH 09/27/2015 31.4  26.0 - 34.0 pg Final  . MCHC 09/27/2015 33.6  32.0 - 36.0 g/dL Final  . RDW 09/27/2015 17.5* 11.5 - 14.5 % Final  . Platelets 09/27/2015 307  150 - 440 K/uL Final  . Neutrophils Relative % 09/27/2015 58   Final  . Neutro Abs 09/27/2015 4.3  1.4 - 6.5 K/uL Final  . Lymphocytes Relative 09/27/2015 35   Final  . Lymphs Abs 09/27/2015 2.6  1.0 - 3.6 K/uL Final  . Monocytes Relative 09/27/2015 7   Final  . Monocytes Absolute 09/27/2015 0.5  0.2 - 1.0 K/uL Final  . Eosinophils Relative 09/27/2015 0   Final  . Eosinophils Absolute 09/27/2015 0.0  0 - 0.7 K/uL Final  . Basophils Relative 09/27/2015 0   Final  . Basophils Absolute 09/27/2015 0.0  0 - 0.1 K/uL Final  . Sodium 09/27/2015 131* 135 - 145 mmol/L Final  . Potassium 09/27/2015 3.2* 3.5 - 5.1 mmol/L Final  . Chloride 09/27/2015 99* 101 - 111 mmol/L Final  . CO2 09/27/2015 22  22 - 32 mmol/L Final  . Glucose, Bld 09/27/2015 114* 65 - 99 mg/dL Final  . BUN 09/27/2015 26* 6 - 20 mg/dL Final  . Creatinine, Ser 09/27/2015 0.60* 0.61 - 1.24 mg/dL Final  . Calcium 09/27/2015 8.7* 8.9 - 10.3 mg/dL Final  . Total Protein 09/27/2015 6.3* 6.5 - 8.1 g/dL Final  . Albumin 09/27/2015 2.9* 3.5 - 5.0 g/dL Final  . AST 09/27/2015 18  15 - 41 U/L Final  . ALT 09/27/2015 12* 17 - 63 U/L Final  . Alkaline Phosphatase 09/27/2015 52  38 - 126 U/L Final  . Total Bilirubin 09/27/2015 0.5  0.3 - 1.2 mg/dL Final  . GFR calc non Af Amer 09/27/2015 >60  >60 mL/min Final  . GFR calc Af Amer 09/27/2015 >60  >60 mL/min Final   Comment: (NOTE) The eGFR has been calculated using the CKD EPI equation. This calculation has not been validated in all clinical situations. eGFR's persistently <60 mL/min signify possible Chronic Kidney Disease.   . Anion gap 09/27/2015 10  5 -  15 Final  . Magnesium 09/27/2015 1.3* 1.7 - 2.4 mg/dL Final       ASSESSMENT:  A. 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  Chronic tobacco abuse.  Patient is being treated by pain clinic physician and has been on chronic 5 tablet of oxycodone for chronic back pain.  Presently the need for oxycodone was increased because of severe throat pain    PLAN:   Patient has finished radiation and chemotherapy. Has now stomatitis is esophagitis patient will be started on prednisone 60 mg taper by 10 mg to 0 Was encouraged to increase oral intake Peck tube has been pulled out Patient's need for oxycodone was increased because of increased pain in the throat.  Patient had been on 5 tablet of oxycodone managed by pain clinic physicians for chronic back pain And in 4 weeks later on a PET scan would be recommended an ENT evaluation would be recommended.  The patient also will be referred for survivorship visit Total duration of visit was68mnutes.  50% or more time was spent in counseling patient and family regarding prognosis and options of treatment and available resources  Lab data has been reviewed        No matching staging information was found for the patient.  JForest Gleason MD   09/27/2015 11:09 AM

## 2015-10-01 ENCOUNTER — Encounter: Payer: Self-pay | Admitting: Oncology

## 2015-10-02 ENCOUNTER — Emergency Department: Payer: Medicare Other

## 2015-10-02 ENCOUNTER — Telehealth: Payer: Self-pay | Admitting: *Deleted

## 2015-10-02 ENCOUNTER — Encounter: Payer: Self-pay | Admitting: *Deleted

## 2015-10-02 ENCOUNTER — Inpatient Hospital Stay
Admission: EM | Admit: 2015-10-02 | Discharge: 2015-10-10 | DRG: 177 | Disposition: A | Discharge status: Skilled Nursing Facility | Payer: Medicare Other | Attending: Internal Medicine | Admitting: Internal Medicine

## 2015-10-02 DIAGNOSIS — J159 Unspecified bacterial pneumonia: Secondary | ICD-10-CM | POA: Diagnosis present

## 2015-10-02 DIAGNOSIS — I10 Essential (primary) hypertension: Secondary | ICD-10-CM | POA: Diagnosis present

## 2015-10-02 DIAGNOSIS — M1990 Unspecified osteoarthritis, unspecified site: Secondary | ICD-10-CM | POA: Diagnosis present

## 2015-10-02 DIAGNOSIS — K121 Other forms of stomatitis: Secondary | ICD-10-CM | POA: Diagnosis present

## 2015-10-02 DIAGNOSIS — Z923 Personal history of irradiation: Secondary | ICD-10-CM | POA: Diagnosis not present

## 2015-10-02 DIAGNOSIS — Z7952 Long term (current) use of systemic steroids: Secondary | ICD-10-CM

## 2015-10-02 DIAGNOSIS — M6281 Muscle weakness (generalized): Secondary | ICD-10-CM | POA: Diagnosis not present

## 2015-10-02 DIAGNOSIS — R918 Other nonspecific abnormal finding of lung field: Secondary | ICD-10-CM | POA: Diagnosis not present

## 2015-10-02 DIAGNOSIS — C029 Malignant neoplasm of tongue, unspecified: Secondary | ICD-10-CM | POA: Diagnosis present

## 2015-10-02 DIAGNOSIS — R1310 Dysphagia, unspecified: Secondary | ICD-10-CM | POA: Diagnosis not present

## 2015-10-02 DIAGNOSIS — K210 Gastro-esophageal reflux disease with esophagitis: Secondary | ICD-10-CM | POA: Diagnosis not present

## 2015-10-02 DIAGNOSIS — R42 Dizziness and giddiness: Secondary | ICD-10-CM | POA: Diagnosis not present

## 2015-10-02 DIAGNOSIS — E860 Dehydration: Secondary | ICD-10-CM | POA: Diagnosis not present

## 2015-10-02 DIAGNOSIS — D649 Anemia, unspecified: Secondary | ICD-10-CM | POA: Diagnosis not present

## 2015-10-02 DIAGNOSIS — J189 Pneumonia, unspecified organism: Secondary | ICD-10-CM

## 2015-10-02 DIAGNOSIS — G894 Chronic pain syndrome: Secondary | ICD-10-CM | POA: Diagnosis not present

## 2015-10-02 DIAGNOSIS — Z87891 Personal history of nicotine dependence: Secondary | ICD-10-CM

## 2015-10-02 DIAGNOSIS — K2980 Duodenitis without bleeding: Secondary | ICD-10-CM | POA: Diagnosis present

## 2015-10-02 DIAGNOSIS — R1313 Dysphagia, pharyngeal phase: Secondary | ICD-10-CM | POA: Diagnosis present

## 2015-10-02 DIAGNOSIS — E162 Hypoglycemia, unspecified: Secondary | ICD-10-CM | POA: Diagnosis present

## 2015-10-02 DIAGNOSIS — K319 Disease of stomach and duodenum, unspecified: Secondary | ICD-10-CM | POA: Diagnosis present

## 2015-10-02 DIAGNOSIS — E46 Unspecified protein-calorie malnutrition: Secondary | ICD-10-CM | POA: Diagnosis not present

## 2015-10-02 DIAGNOSIS — Z931 Gastrostomy status: Secondary | ICD-10-CM

## 2015-10-02 DIAGNOSIS — E43 Unspecified severe protein-calorie malnutrition: Secondary | ICD-10-CM | POA: Diagnosis not present

## 2015-10-02 DIAGNOSIS — Z9221 Personal history of antineoplastic chemotherapy: Secondary | ICD-10-CM

## 2015-10-02 DIAGNOSIS — K2950 Unspecified chronic gastritis without bleeding: Secondary | ICD-10-CM | POA: Diagnosis present

## 2015-10-02 DIAGNOSIS — Z85810 Personal history of malignant neoplasm of tongue: Secondary | ICD-10-CM | POA: Diagnosis not present

## 2015-10-02 DIAGNOSIS — R634 Abnormal weight loss: Secondary | ICD-10-CM | POA: Diagnosis not present

## 2015-10-02 DIAGNOSIS — K259 Gastric ulcer, unspecified as acute or chronic, without hemorrhage or perforation: Secondary | ICD-10-CM | POA: Diagnosis present

## 2015-10-02 DIAGNOSIS — K269 Duodenal ulcer, unspecified as acute or chronic, without hemorrhage or perforation: Secondary | ICD-10-CM | POA: Diagnosis present

## 2015-10-02 DIAGNOSIS — R64 Cachexia: Secondary | ICD-10-CM | POA: Diagnosis present

## 2015-10-02 DIAGNOSIS — R278 Other lack of coordination: Secondary | ICD-10-CM | POA: Diagnosis not present

## 2015-10-02 DIAGNOSIS — J690 Pneumonitis due to inhalation of food and vomit: Principal | ICD-10-CM | POA: Diagnosis present

## 2015-10-02 DIAGNOSIS — R262 Difficulty in walking, not elsewhere classified: Secondary | ICD-10-CM | POA: Diagnosis not present

## 2015-10-02 DIAGNOSIS — E871 Hypo-osmolality and hyponatremia: Secondary | ICD-10-CM | POA: Diagnosis not present

## 2015-10-02 DIAGNOSIS — C140 Malignant neoplasm of pharynx, unspecified: Secondary | ICD-10-CM | POA: Diagnosis not present

## 2015-10-02 DIAGNOSIS — K2210 Ulcer of esophagus without bleeding: Secondary | ICD-10-CM | POA: Diagnosis present

## 2015-10-02 DIAGNOSIS — R531 Weakness: Secondary | ICD-10-CM | POA: Diagnosis not present

## 2015-10-02 DIAGNOSIS — Z8249 Family history of ischemic heart disease and other diseases of the circulatory system: Secondary | ICD-10-CM | POA: Diagnosis not present

## 2015-10-02 DIAGNOSIS — K219 Gastro-esophageal reflux disease without esophagitis: Secondary | ICD-10-CM | POA: Diagnosis not present

## 2015-10-02 DIAGNOSIS — R1312 Dysphagia, oropharyngeal phase: Secondary | ICD-10-CM | POA: Diagnosis not present

## 2015-10-02 LAB — CBC WITH DIFFERENTIAL/PLATELET
BASOS ABS: 0 10*3/uL (ref 0–0.1)
BASOS PCT: 0 %
EOS PCT: 0 %
Eosinophils Absolute: 0 10*3/uL (ref 0–0.7)
HCT: 33.6 % — ABNORMAL LOW (ref 40.0–52.0)
Hemoglobin: 10.9 g/dL — ABNORMAL LOW (ref 13.0–18.0)
Lymphocytes Relative: 10 %
Lymphs Abs: 1.3 10*3/uL (ref 1.0–3.6)
MCH: 30.7 pg (ref 26.0–34.0)
MCHC: 32.3 g/dL (ref 32.0–36.0)
MCV: 95 fL (ref 80.0–100.0)
MONO ABS: 0.5 10*3/uL (ref 0.2–1.0)
Monocytes Relative: 4 %
Neutro Abs: 12 10*3/uL — ABNORMAL HIGH (ref 1.4–6.5)
Neutrophils Relative %: 86 %
PLATELETS: 277 10*3/uL (ref 150–440)
RBC: 3.54 MIL/uL — ABNORMAL LOW (ref 4.40–5.90)
RDW: 17.9 % — AB (ref 11.5–14.5)
WBC: 13.8 10*3/uL — ABNORMAL HIGH (ref 3.8–10.6)

## 2015-10-02 LAB — COMPREHENSIVE METABOLIC PANEL
ALBUMIN: 2.1 g/dL — AB (ref 3.5–5.0)
ALT: 10 U/L — ABNORMAL LOW (ref 17–63)
ANION GAP: 8 (ref 5–15)
AST: 13 U/L — AB (ref 15–41)
Alkaline Phosphatase: 61 U/L (ref 38–126)
BUN: 26 mg/dL — AB (ref 6–20)
CHLORIDE: 103 mmol/L (ref 101–111)
CO2: 24 mmol/L (ref 22–32)
Calcium: 8.8 mg/dL — ABNORMAL LOW (ref 8.9–10.3)
Creatinine, Ser: 0.47 mg/dL — ABNORMAL LOW (ref 0.61–1.24)
GFR calc Af Amer: >60 mL/min (ref >60)
Glucose, Bld: 80 mg/dL (ref 65–99)
POTASSIUM: 3.9 mmol/L (ref 3.5–5.1)
Sodium: 135 mmol/L (ref 135–145)
Total Bilirubin: 0.5 mg/dL (ref 0.3–1.2)
Total Protein: 6 g/dL — ABNORMAL LOW (ref 6.5–8.1)

## 2015-10-02 LAB — EXPECTORATED SPUTUM ASSESSMENT W REFEX TO RESP CULTURE

## 2015-10-02 LAB — URINALYSIS COMPLETE WITH MICROSCOPIC (ARMC ONLY)
Bacteria, UA: NONE SEEN
Bilirubin Urine: NEGATIVE
GLUCOSE, UA: NEGATIVE mg/dL
HGB URINE DIPSTICK: NEGATIVE
Ketones, ur: NEGATIVE mg/dL
LEUKOCYTES UA: NEGATIVE
NITRITE: NEGATIVE
PH: 5 (ref 5.0–8.0)
PROTEIN: 30 mg/dL — AB
SPECIFIC GRAVITY, URINE: 1.056 — AB (ref 1.005–1.030)

## 2015-10-02 LAB — EXPECTORATED SPUTUM ASSESSMENT W GRAM STAIN, RFLX TO RESP C

## 2015-10-02 LAB — TROPONIN I: TROPONIN I: 0.1 ng/mL — AB (ref <0.031)

## 2015-10-02 MED ORDER — LEVOFLOXACIN IN D5W 750 MG/150ML IV SOLN
750.0000 mg | Freq: Once | INTRAVENOUS | Status: AC
  Administered 2015-10-02: 750 mg via INTRAVENOUS
  Filled 2015-10-02: qty 150

## 2015-10-02 MED ORDER — CITALOPRAM HYDROBROMIDE 20 MG PO TABS
20.0000 mg | ORAL_TABLET | Freq: Every day | ORAL | Status: DC
Start: 2015-10-02 — End: 2015-10-04
  Administered 2015-10-02 – 2015-10-03 (×2): 20 mg via ORAL
  Filled 2015-10-02 (×2): qty 1

## 2015-10-02 MED ORDER — ONDANSETRON HCL 4 MG PO TABS: 4.0000 mg | ORAL_TABLET | Freq: Four times a day (QID) | ORAL | Status: DC | PRN

## 2015-10-02 MED ORDER — MAGIC MOUTHWASH W/LIDOCAINE
10.0000 mL | Freq: Once | ORAL | Status: DC
  Filled 2015-10-02: qty 10

## 2015-10-02 MED ORDER — IOHEXOL 350 MG/ML SOLN
75.0000 mL | Freq: Once | INTRAVENOUS | Status: AC | PRN
  Administered 2015-10-02: 75 mL via INTRAVENOUS

## 2015-10-02 MED ORDER — PREDNISONE 10 MG PO TABS: 10.0000 mg | ORAL_TABLET | Freq: Every day | ORAL | Status: DC

## 2015-10-02 MED ORDER — MORPHINE SULFATE (PF) 2 MG/ML IV SOLN
1.0000 mg | Freq: Four times a day (QID) | INTRAVENOUS | Status: DC | PRN
  Administered 2015-10-03: 01:00:00 1 mg via INTRAVENOUS
  Filled 2015-10-02: qty 1

## 2015-10-02 MED ORDER — LISINOPRIL 10 MG PO TABS: 10.0000 mg | ORAL_TABLET | Freq: Every day | ORAL | Status: DC

## 2015-10-02 MED ORDER — ACETAMINOPHEN 160 MG/5ML PO SUSP
ORAL | Status: AC
  Filled 2015-10-02: qty 20

## 2015-10-02 MED ORDER — ACETAMINOPHEN 325 MG PO TABS: 650.0000 mg | ORAL_TABLET | Freq: Four times a day (QID) | ORAL | Status: DC | PRN

## 2015-10-02 MED ORDER — TRAZODONE HCL 100 MG PO TABS
100.0000 mg | ORAL_TABLET | Freq: Every day | ORAL | Status: DC
Start: 2015-10-02 — End: 2015-10-04
  Administered 2015-10-02: 100 mg via ORAL
  Filled 2015-10-02: qty 1

## 2015-10-02 MED ORDER — PANTOPRAZOLE SODIUM 40 MG PO TBEC
40.0000 mg | DELAYED_RELEASE_TABLET | Freq: Two times a day (BID) | ORAL | Status: DC
Start: 2015-10-02 — End: 2015-10-04
  Administered 2015-10-02 – 2015-10-03 (×2): 40 mg via ORAL
  Filled 2015-10-02 (×2): qty 1

## 2015-10-02 MED ORDER — PRAVASTATIN SODIUM 40 MG PO TABS
40.0000 mg | ORAL_TABLET | Freq: Every day | ORAL | Status: DC
Start: 2015-10-02 — End: 2015-10-04
  Administered 2015-10-02: 40 mg via ORAL
  Filled 2015-10-02: qty 1

## 2015-10-02 MED ORDER — OXYCODONE HCL 5 MG PO TABS
30.0000 mg | ORAL_TABLET | ORAL | Status: DC | PRN
  Administered 2015-10-02 – 2015-10-03 (×4): 30 mg via ORAL
  Filled 2015-10-02 (×4): qty 6

## 2015-10-02 MED ORDER — BENZOCAINE 10 % MT GEL
Freq: Three times a day (TID) | OROMUCOSAL | Status: DC | PRN
  Administered 2015-10-03 – 2015-10-04 (×4): via OROMUCOSAL
  Filled 2015-10-02: qty 9.4

## 2015-10-02 MED ORDER — SODIUM CHLORIDE 0.9 % IV SOLN
INTRAVENOUS | Status: DC
Start: 2015-10-02 — End: 2015-10-05
  Administered 2015-10-02 – 2015-10-05 (×4): via INTRAVENOUS

## 2015-10-02 MED ORDER — PIPERACILLIN-TAZOBACTAM 3.375 G IVPB
3.3750 g | Freq: Once | INTRAVENOUS | Status: AC
  Administered 2015-10-02: 3.375 g via INTRAVENOUS
  Filled 2015-10-02: qty 50

## 2015-10-02 MED ORDER — ACETAMINOPHEN 650 MG RE SUPP
650.0000 mg | Freq: Four times a day (QID) | RECTAL | Status: DC | PRN
  Filled 2015-10-02: qty 1

## 2015-10-02 MED ORDER — VANCOMYCIN HCL IN DEXTROSE 1-5 GM/200ML-% IV SOLN: 1000.0000 mg | Freq: Once | INTRAVENOUS | Status: DC

## 2015-10-02 MED ORDER — OXYCODONE HCL 30 MG PO TABS: ORAL_TABLET | ORAL | Status: DC

## 2015-10-02 MED ORDER — PREDNISONE 20 MG PO TABS
30.0000 mg | ORAL_TABLET | Freq: Every day | ORAL | Status: AC
  Administered 2015-10-03: 30 mg via ORAL
  Filled 2015-10-02: qty 1

## 2015-10-02 MED ORDER — FENTANYL 50 MCG/HR TD PT72
50.0000 ug | MEDICATED_PATCH | TRANSDERMAL | Status: DC
  Administered 2015-10-04 – 2015-10-08 (×3): 50 ug via TRANSDERMAL
  Filled 2015-10-02 (×3): qty 1

## 2015-10-02 MED ORDER — ONDANSETRON HCL 4 MG/2ML IJ SOLN: 4.0000 mg | Freq: Four times a day (QID) | INTRAMUSCULAR | Status: DC | PRN

## 2015-10-02 MED ORDER — ENOXAPARIN SODIUM 40 MG/0.4ML ~~LOC~~ SOLN
40.0000 mg | SUBCUTANEOUS | Status: DC
  Administered 2015-10-02 – 2015-10-05 (×4): 40 mg via SUBCUTANEOUS
  Filled 2015-10-02 (×4): qty 0.4

## 2015-10-02 MED ORDER — PREDNISONE 10 MG PO TABS: 10.0000 mg | ORAL_TABLET | ORAL | Status: DC

## 2015-10-02 MED ORDER — SODIUM CHLORIDE 0.9 % IV SOLN
Freq: Once | INTRAVENOUS | Status: AC
  Administered 2015-10-02: 12:00:00 via INTRAVENOUS

## 2015-10-02 MED ORDER — NYSTATIN 100000 UNIT/ML MT SUSP
5.0000 mL | OROMUCOSAL | Status: DC | PRN
  Administered 2015-10-03: 500000 [IU] via ORAL
  Filled 2015-10-02 (×2): qty 5

## 2015-10-02 MED ORDER — ONDANSETRON HCL 4 MG PO TABS
4.0000 mg | ORAL_TABLET | ORAL | Status: DC | PRN
Start: 2015-10-02 — End: 2015-10-02

## 2015-10-02 MED ORDER — PREDNISONE 20 MG PO TABS: 20.0000 mg | ORAL_TABLET | Freq: Every day | ORAL | Status: DC

## 2015-10-02 MED ORDER — PHENOL 1.4 % MT LIQD
1.0000 | OROMUCOSAL | Status: DC | PRN
  Administered 2015-10-04 – 2015-10-05 (×2): 1 via OROMUCOSAL
  Filled 2015-10-02: qty 177

## 2015-10-02 MED ORDER — RISAQUAD PO CAPS
1.0000 | ORAL_CAPSULE | Freq: Every day | ORAL | Status: DC
  Administered 2015-10-02: 1 via ORAL
  Filled 2015-10-02 (×2): qty 1

## 2015-10-02 MED ORDER — SODIUM CHLORIDE 0.9 % IV SOLN
Freq: Once | INTRAVENOUS | Status: AC
  Administered 2015-10-02: 15:00:00 via INTRAVENOUS

## 2015-10-02 MED ORDER — ACETAMINOPHEN 160 MG/5ML PO SOLN
500.0000 mg | Freq: Four times a day (QID) | ORAL | Status: DC | PRN
  Administered 2015-10-02: 500 mg via ORAL
  Filled 2015-10-02: qty 20

## 2015-10-02 MED ORDER — LIDOCAINE VISCOUS 2 % MT SOLN
5.0000 mL | Freq: Once | OROMUCOSAL | Status: AC
  Administered 2015-10-02: 5 mL via OROMUCOSAL
  Filled 2015-10-02: qty 15

## 2015-10-02 MED ORDER — MAGIC MOUTHWASH: 5.0000 mL | Freq: Once | ORAL | Status: DC

## 2015-10-02 MED ORDER — SODIUM CHLORIDE 0.9 % IJ SOLN
10.0000 mL | INTRAMUSCULAR | Status: DC | PRN
  Administered 2015-10-04 (×2): 10 mL via INTRAVENOUS
  Filled 2015-10-02 (×2): qty 10

## 2015-10-02 MED ORDER — SODIUM CHLORIDE 0.9 % IV SOLN
Freq: Once | INTRAVENOUS | Status: AC
  Administered 2015-10-02: 13:00:00 via INTRAVENOUS

## 2015-10-02 MED ORDER — LEVOFLOXACIN IN D5W 750 MG/150ML IV SOLN
750.0000 mg | INTRAVENOUS | Status: AC
  Administered 2015-10-03 – 2015-10-07 (×5): 750 mg via INTRAVENOUS
  Filled 2015-10-02 (×5): qty 150

## 2015-10-02 NOTE — H&P (Signed)
[facility] [place] NAME: Grant Lee    MR#:  [id number]  DATE OF BIRTH:  1956/09/21  DATE OF ADMISSION:  10/02/2015  PRIMARY CARE PHYSICIAN: Fae Pippin   REQUESTING/REFERRING PHYSICIAN: Dr Jimmye Norman  CHIEF COMPLAINT:   Severe throat pain, not feeling well generalized weakness poor by mouth intake and weight loss HISTORY OF PRESENT ILLNESS:  Grant Lee  is a 59 y.o. 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 In the ER patient's chest x-ray showed right lower lobe infiltrate. CT of the chest was done which is negative for PE however was found to have multi-lobar pneumonia. He received IV Levaquin. White count was 13,000. Patient is being admitted for multifocal pneumonia rule out aspiration  PAST MEDICAL HISTORY:   Past Medical History  Diagnosis Date  . Hypertension   . Pneumonia     2004  . GERD (gastroesophageal reflux disease)   . Arthritis   . Anemia     blood transfusion 2012  . Bleeding ulcer   . Machinery accident     LEG INJURY  . Cancer [facility])     mouth 2006 then again 2016    PAST SURGICAL HISTOIRY:   Past Surgical History  Procedure Laterality Date  . Leg surgery    . Joint replacement      right hip  . Tongue biopsy    . Tongue surgery      multiple surgeries  . Direct laryngoscopy N/A 06/26/2015    Procedure: Laryngoscopy with tongue base biopsy ;  Surgeon: Beverly Gust, MD;  Location: [facility] ORS;  Service: ENT;  Laterality: N/A;  . Esophagogastroduodenoscopy N/A 07/06/2015    Procedure: ESOPHAGOGASTRODUODENOSCOPY (EGD) and percutaneous gastrostomy  tyube placement;  Surgeon: Lollie Sails, MD;  Location: [facility] ENDOSCOPY;  Service: Endoscopy;  Laterality: N/A;  Residual need to be done in the afternoon on 07/06/2015. Combined procedure with Dr. Gustavo Lah and Dr Rayann Heman  . Peripheral vascular catheterization N/A 07/27/2015    Procedure: Glori Luis Cath Insertion;  Surgeon: Katha Cabal, MD;  Location: [place] CV LAB;  Service: Cardiovascular;  Laterality: N/A;  . Esophagogastroduodenoscopy (egd) with propofol N/A 08/07/2015    Procedure: ESOPHAGOGASTRODUODENOSCOPY (EGD) WITH PROPOFOL;  Surgeon: Lollie Sails, MD;  Location: [facility] ENDOSCOPY;  Service: Endoscopy;  Laterality: N/A;    SOCIAL HISTORY:   Social History  Substance Use Topics  . Smoking status: Former Smoker -- 1.00 packs/day for 0 years    Types: Cigarettes    Quit date: 09/21/2014  . Smokeless tobacco: Not on file  . Alcohol Use: 7.2 oz/week    12 Cans of beer per week     Comment: beer/wine every day    FAMILY HISTORY:   Family History  Problem Relation Age of Onset  . CAD Mother   . CAD Father     DRUG ALLERGIES:  No Known Allergies  REVIEW OF SYSTEMS:  Review of Systems  Constitutional: Positive for weight loss and malaise/fatigue. Negative for fever and chills.  HENT: Negative for ear discharge, ear pain and nosebleeds.   Eyes: Negative for blurred vision, pain and discharge.  Respiratory: Positive for cough and sputum production. Negative for shortness  of breath, wheezing and stridor.   Cardiovascular: Negative for chest pain, palpitations, orthopnea and PND.  Gastrointestinal: Negative for nausea, vomiting, abdominal pain and diarrhea.  Genitourinary: Negative for urgency and frequency.  Musculoskeletal: Negative for back pain and joint pain.  Neurological: Positive for weakness and headaches. Negative for sensory change, speech change and focal weakness.  Psychiatric/Behavioral: Negative for depression and hallucinations. The patient is not  nervous/anxious.   All other systems reviewed and are negative.    MEDICATIONS AT HOME:   Prior to Admission medications   Medication Sig Start Date End Date Taking? Authorizing Provider  acetaminophen (TYLENOL) 325 MG tablet Take 650 mg by mouth every 6 (six) hours as needed for mild pain.   Yes Historical Provider, MD  acidophilus (RISAQUAD) CAPS capsule Take 1 capsule by mouth at bedtime.    Yes Historical Provider, MD  Benzocaine (ANBESOL MT) 1 application by Other route as needed (for gum pain).   Yes Historical Provider, MD  citalopram (CELEXA) 20 MG tablet Take 1 tablet (20 mg total) by mouth daily. 07/26/15  Yes Forest Gleason, MD  fentaNYL (DURAGESIC - DOSED MCG/HR) 50 MCG/HR Place 1 patch (50 mcg total) onto the skin every 3 (three) days. 09/03/15  Yes Forest Gleason, MD  lisinopril (PRINIVIL,ZESTRIL) 10 MG tablet Take 10 mg by mouth at bedtime.    Yes Historical Provider, MD  nystatin (MYCOSTATIN) 100000 UNIT/ML suspension Take 5 mLs by mouth as needed (for thrush).   Yes Historical Provider, MD  ondansetron (ZOFRAN) 4 MG tablet Take 1 tablet (4 mg total) by mouth every 4 (four) hours as needed for nausea or vomiting. 08/08/15  Yes Srikar Sudini, MD  oxycodone (ROXICODONE) 30 MG immediate release tablet Take 30 mg by mouth See admin instructions. Pt is able to take up to seven times a day as needed for pain.   Yes Historical Provider, MD  pantoprazole (PROTONIX) 40 MG tablet Take 1 tablet (40 mg total) by mouth 2 (two) times daily. 08/22/15  Yes Forest Gleason, MD  phenol (CHLORASEPTIC) 1.4 % LIQD Use as directed 1 spray in the mouth or throat as needed for throat irritation / pain. 07/07/15  Yes Fritzi Mandes, MD  pravastatin (PRAVACHOL) 40 MG tablet Take 40 mg by mouth at bedtime.    Yes Historical Provider, MD  predniSONE (DELTASONE) 10 MG tablet Take 10-60 mg by mouth See admin instructions. Pt is to take six tablets on day 1, five tablets on day 2, four tablets on day 3, three tablets on day  4, two tablets on day 5, and one tablet on day 6. 09/28/15 10/04/15 Yes Historical Provider, MD  traZODone (DESYREL) 100 MG tablet Take 100 mg by mouth at bedtime.   Yes Historical Provider, MD  chlorhexidine (PERIDEX) 0.12 % solution 15 mLs by Mouth Rinse route 2 (two) times daily. Patient not taking: Reported on 10/02/2015 07/07/15   Fritzi Mandes, MD  dexamethasone (DECADRON) 4 MG tablet Take 1 tablet (4 mg total) by mouth 2 (two) times daily with a meal. 1 tablet BID through 11/14.  Take 1 tablet daily 11/15-11/21.  Then take 1 tablet every other day until finished. Patient not taking: Reported on 10/02/2015 09/17/15   Noreene Filbert, MD  lidocaine-prilocaine (EMLA) cream Apply 1 application topically as needed. Patient not taking: Reported on 10/02/2015 08/01/15   Forest Gleason, MD  promethazine (PHENERGAN) 25 MG suppository Place 1 suppository (25 mg total) rectally every 8 (eight) hours as needed for nausea or vomiting. Patient  not taking: Reported on 10/02/2015 08/08/15   Hillary Bow, MD  sucralfate (CARAFATE) 1 GM/10ML suspension Take 10 mLs (1 g total) by mouth 4 (four) times daily -  with meals and at bedtime. Patient not taking: Reported on 10/02/2015 08/09/15   Hillary Bow, MD      VITAL SIGNS:  Blood pressure 114/77, pulse 93, temperature 98.4 F (36.9 C), temperature source Oral, resp. rate 20, height '6\' 2"'$  (1.88 m), weight 50.803 kg (112 lb), SpO2 92 %.  PHYSICAL EXAMINATION:  GENERAL:  59 y.o.-year-old patient lying in the bed with Mild acute distress. Cachectic and malnourished EYES: Pupils equal, round, reactive to light and accommodation. No scleral icterus. Extraocular muscles intact.  HEENT: Head atraumatic, normocephalic. Patient appears to have erythematous oropharynx. NECK:  Supple, no jugular venous distention. No thyroid enlargement, no tenderness. Redness of the skin over the neck secondary to radiation LUNGS: Decreased breath sounds bilaterally, no wheezing,  rales,rhonchi or crepitation. No use of accessory muscles of respiration. Port-A-Cath present CARDIOVASCULAR: S1, S2 normal. No murmurs, rubs, or gallops.  ABDOMEN: Soft, nontender, nondistended. Bowel sounds present. No organomegaly or mass. PEG site healed well EXTREMITIES: No pedal edema, cyanosis, or clubbing.  NEUROLOGIC: Cranial nerves II through XII are intact. Muscle strength 5/5 in all extremities. Sensation intact. Gait not checked.  PSYCHIATRIC: The patient is alert and oriented x 3.  SKIN: No obvious rash, lesion, or ulcer.   LABORATORY PANEL:   CBC  Recent Labs Lab 10/02/15 1140  WBC 13.8*  HGB 10.9*  HCT 33.6*  PLT 277   ------------------------------------------------------------------------------------------------------------------  Chemistries   Recent Labs Lab 09/27/15 0948 10/02/15 1140  NA 131* 135  K 3.2* 3.9  CL 99* 103  CO2 22 24  GLUCOSE 114* 80  BUN 26* 26*  CREATININE 0.60* 0.47*  CALCIUM 8.7* 8.8*  MG 1.3*  --   AST 18 13*  ALT 12* 10*  ALKPHOS 52 61  BILITOT 0.5 0.5   ------------------------------------------------------------------------------------------------------------------  Cardiac Enzymes  Recent Labs Lab 10/02/15 1140  TROPONINI 0.10*   ------------------------------------------------------------------------------------------------------------------  RADIOLOGY:  Dg Chest 1 View  10/02/2015  CLINICAL DATA:  History throat carcinoma with recent chemo 10 days ago and increasing weakness EXAM: CHEST  1 VIEW COMPARISON:  08/03/2015 FINDINGS: Cardiac shadow is within normal limits. A right chest wall port is again seen. Bibasilar infiltrates worse on the right than the left are now seen new from the prior exam. IMPRESSION: Bibasilar infiltrates right worse than left. Followup PA and lateral chest X-ray is recommended in 3-4 weeks following trial of antibiotic therapy to ensure resolution and exclude underlying malignancy.  Electronically Signed   By: Inez Catalina M.D.   On: 10/02/2015 12:03   Ct Angio Chest Pe W/cm &/or Wo Cm  10/02/2015  CLINICAL DATA:  Remote history of mouth cancer. Current throat cancer. Chemotherapy 10 days ago. Recent radiation. Weakness, hemoptysis today. EXAM: CT ANGIOGRAPHY CHEST WITH CONTRAST TECHNIQUE: Multidetector CT imaging of the chest was performed using the standard protocol during bolus administration of intravenous contrast. Multiplanar CT image reconstructions and MIPs were obtained to evaluate the vascular anatomy. CONTRAST:  75m OMNIPAQUE IOHEXOL 350 MG/ML SOLN COMPARISON:  Chest x-ray earlier today.  Chest CT 03/23/2014 FINDINGS: No filling defects in the pulmonary arteries to suggest pulmonary emboli. Emphysematous changes in the lungs. Bilateral lower lobe airspace opacities as well as airspace opacity in the right middle lobe. There are innumerable nodular opacities in these areas. This most likely reflects pneumonia, but warrants  follow-up after treatment. Mucous plugging in the lower lobe airways bilaterally. Heart is normal size. Coronary artery calcifications noted. Aorta normal caliber with scattered calcifications. Mildly prominent mediastinal lymph nodes. Subcarinal lymph node has a short axis diameter of 11 mm. Right paratracheal lymph node has a short axis diameter of 10 mm. Mildly prominent AP window lymph nodes. Chest wall soft tissues are unremarkable. Imaging into the upper abdomen shows no acute findings. No acute bony abnormality. Review of the MIP images confirms the above findings. IMPRESSION: No evidence of pulmonary embolus. Extensive and airspace disease in the lower lobes bilaterally and right middle lobe with a nodular appearance. Findings most likely reflect multifocal pneumonia. Airway mucous plugging noted in the lower lobes bilaterally. Recommend follow-up after treatment to assure resolution. Mildly prominent mediastinal lymph nodes, likely reactive. Recommend  attention on followup imaging. Coronary artery disease. Electronically Signed   By: Rolm Baptise M.D.   On: 10/02/2015 15:29   IMPRESSION AND PLAN:   Grant Lee  is a 59 y.o. 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  1. Multifocal pneumonia  Admitted to medical floor IV Levaquin Follow blood cultures sputum culture and white count Breathing treatment as needed  2. Odynophagia Status post radiation therapy completed 10 days ago for throat/tongue cancer chlorSeptic spray,Nystatin swish and swallow Ice chips and meds Speech therapy to see patient Continue by mouth pain meds as needed and patient takes oxycodone immediate release up to 5 times a day  3. Cachexia and malnutrition Dietitian to see patient Patient spent 2 fell off accidentally 2 weeks ago. Patient however was able to take soft diet at home  4.GERD continue Protonix  5. Lovenox for DVT prophylaxis    All the records are reviewed and case discussed with ED provider. Management plans discussed with the patient, family and they are in agreement.  CODE STATUS: Full  TOTAL TIME TAKING CARE OF THIS PATIENT:55 minutes.    Ida Milbrath M.D on 10/02/2015 at 4:31 PM  Between 7am to 6pm - Pager - [phone]  After 6pm go to [email or web address] - password [email or web address] [facility]  Office  [phone]  CC: Primary care physician; Cyndi Bender, [name]

## 2015-10-02 NOTE — ED Provider Notes (Signed)
[facility] Emergency Department Provider Note     Time seen: ----------------------------------------- 11:13 AM on 10/02/2015 -----------------------------------------    I have reviewed the triage vital signs and the nursing notes.   HISTORY  Chief Complaint No chief complaint on file.    HPI Grant Lee is a 59 y.o. male history of throat cancer who presents with oral and pharyngeal pain as well as weakness. Patient states she's been weak and falling, hasn't had anything to eat or drink in the last several days. According to report he has throat cancer and has just finished radiation. He has not had chemotherapy in the last month. He is a patient of Dr. Oliva Bustard states he does have a port.   Past Medical History  Diagnosis Date  . Hypertension   . Pneumonia     2004  . GERD (gastroesophageal reflux disease)   . Arthritis   . Anemia     blood transfusion 2012  . Bleeding ulcer   . Machinery accident     LEG INJURY  . Cancer [facility])     mouth 2006 then again 2016    Patient Active Problem List   Diagnosis Date Noted  . Malignant neoplasm of tongue ([place]) 08/30/2015  . PUD (peptic ulcer disease) 08/08/2015  . Acute esophagitis 08/08/2015  . Chronic pain syndrome 08/08/2015  . Esophagitis 08/08/2015  . Chemotherapy induced nausea and vomiting 08/03/2015  . N&V (nausea and vomiting) 08/03/2015  . Cancer of tongue ([place])   . Protein-calorie malnutrition, severe ([place]) 07/05/2015  . Severe protein-calorie malnutrition [employer]: less than 60% of standard weight) ([place]) 07/05/2015  . Malnutrition ([place]) 07/03/2015  . Aspiration pneumonitis ([place]) 07/03/2015  . Pneumonitis due to inhalation of food or vomitus ([place]) 07/03/2015  . DU (duodenal ulcer) 06/11/2015  . Tongue cancer ([place]) 06/11/2015  . Duodenal ulcer without hemorrhage or perforation 06/11/2015  . Abnormal loss of weight 10/09/2014  . Can't get food down 10/09/2014    Past Surgical  History  Procedure Laterality Date  . Leg surgery    . Joint replacement      right hip  . Tongue biopsy    . Tongue surgery      multiple surgeries  . Direct laryngoscopy N/A 06/26/2015    Procedure: Laryngoscopy with tongue base biopsy ;  Surgeon: Beverly Gust, MD;  Location: [facility] ORS;  Service: ENT;  Laterality: N/A;  . Esophagogastroduodenoscopy N/A 07/06/2015    Procedure: ESOPHAGOGASTRODUODENOSCOPY (EGD) and percutaneous gastrostomy tyube placement;  Surgeon: Lollie Sails, MD;  Location: [facility] ENDOSCOPY;  Service: Endoscopy;  Laterality: N/A;  Residual need to be done in the afternoon on 07/06/2015. Combined procedure with Dr. Gustavo Lah and Dr Rayann Heman  . Peripheral vascular catheterization N/A 07/27/2015    Procedure: Glori Luis Cath Insertion;  Surgeon: Katha Cabal, MD;  Location: [place] CV LAB;  Service: Cardiovascular;  Laterality: N/A;  . Esophagogastroduodenoscopy (egd) with propofol N/A 08/07/2015    Procedure: ESOPHAGOGASTRODUODENOSCOPY (EGD) WITH PROPOFOL;  Surgeon: Lollie Sails, MD;  Location: [facility] ENDOSCOPY;  Service: Endoscopy;  Laterality: N/A;    Allergies Review of patient's allergies indicates no known allergies.  Social History Social History  Substance Use Topics  . Smoking status: Former Smoker -- 1.00 packs/day for 0 years    Types: Cigarettes    Quit date: 09/21/2014  . Smokeless tobacco: Not on file  . Alcohol Use: 7.2 oz/week    12 Cans of beer per week     Comment: beer/wine  every day    Review of Systems Constitutional: Negative for fever. Eyes: Negative for visual changes. ENT: Positive for sore mouth and throat Cardiovascular: Negative for chest pain. Respiratory: Negative for shortness of breath. Gastrointestinal: Negative for abdominal pain, vomiting and diarrhea. Genitourinary: Negative for dysuria. Musculoskeletal: Negative for back pain. Skin: Negative for rash. Neurological: Negative for headaches, positive for  weakness  10-point ROS otherwise negative.  ____________________________________________   PHYSICAL EXAM:  VITAL SIGNS: ED Triage Vitals  Enc Vitals Group     BP --      Pulse --      Resp --      Temp --      Temp src --      SpO2 --      Weight --      Height --      Head Cir --      Peak Flow --      Pain Score --      Pain Loc --      Pain Edu? --      Excl. in [place]? --     Constitutional: Alert and oriented, cachectic, generally ill-appearing Eyes: Conjunctivae are normal. PERRL. Normal extraocular movements. ENT   Head: Normocephalic and atraumatic.   Nose: No congestion/rhinnorhea.   Mouth/Throat: Diffuse tan oral lesions, gingival and buccal mucosal inflammation.   Neck: No stridor. Cardiovascular: Normal rate, regular rhythm. Normal and symmetric distal pulses are present in all extremities. No murmurs, rubs, or gallops. Respiratory: Normal respiratory effort without tachypnea nor retractions. Breath sounds are clear and equal bilaterally. No wheezes/rales/rhonchi. Gastrointestinal: Soft and nontender. No distention.  Musculoskeletal: Nontender with normal range of motion in all extremities.  Neurologic:  Normal speech and language. No gross focal neurologic deficits are appreciated. Speech is normal. No gait instability. Generalized weakness Skin:  Skin is warm, dry and intact. No rash noted. Peripheral cyanosis ____________________________________________  EKG: Interpreted by me. Sinus rhythm with a rate of 84 bpm, normal. A, normal. With, normal QT interval. Nonspecific T-wave inversions laterally  ____________________________________________  ED COURSE:  Pertinent labs & imaging results that were available during my care of the patient were reviewed by me and considered in my medical decision making (see chart for details). Patient looks remarkably chronically ill and dehydrated currently. He received saline infusion basic labs and  reevaluation. ____________________________________________    LABS (pertinent positives/negatives)  Labs Reviewed  CBC WITH DIFFERENTIAL/PLATELET - Abnormal; Notable for the following:    WBC 13.8 (*)    RBC 3.54 (*)    Hemoglobin 10.9 (*)    HCT 33.6 (*)    RDW 17.9 (*)    Neutro Abs 12.0 (*)    All other components within normal limits  COMPREHENSIVE METABOLIC PANEL - Abnormal; Notable for the following:    BUN 26 (*)    Creatinine, Ser 0.47 (*)    Calcium 8.8 (*)    Total Protein 6.0 (*)    Albumin 2.1 (*)    AST 13 (*)    ALT 10 (*)    All other components within normal limits  TROPONIN I - Abnormal; Notable for the following:    Troponin I 0.10 (*)    All other components within normal limits  URINALYSIS COMPLETEWITH MICROSCOPIC ([facility] ONLY)   IMPRESSION: Bibasilar infiltrates right worse than left. Followup PA and lateral chest X-ray is recommended in 3-4 weeks following trial of antibiotic therapy to ensure resolution and exclude underlying malignancy. ____________________________________________  FINAL ASSESSMENT  AND PLAN  Weakness, dehydration, stomatitis, pneumonia  Plan: Patient with labs and imaging as dictated above. Patient was given 2 L of normal saline as well as IV Levaquin to cover for pneumonia. He is very dehydrated in addition to that he appears to have pneumonia bibasilarly. Patient began having hemoptysis while in the ER, CT angiogram has been ordered and is pending at this time. Patient will need admission regardless for weakness, dehydration and pneumonia. Patient will also be discussed with oncology.  Earleen Newport, MD   Earleen Newport, MD 10/02/15 [phone]

## 2015-10-02 NOTE — Telephone Encounter (Signed)
Okay to refill oxycodone #60. Prescription ready for pick up at registration desk.

## 2015-10-02 NOTE — ED Notes (Signed)
Pt given ice.  

## 2015-10-02 NOTE — ED Notes (Signed)
Pt reminded of urine sample, pt will try soon

## 2015-10-02 NOTE — Progress Notes (Signed)
  ANTIBIOTIC CONSULT NOTE - INITIAL  Pharmacy Consult for Renal adjust antibiotics Indication: pneumonia  No Known Allergies  Patient Measurements: Height: '6\' 2"'$  (188 cm) Weight: 111 lb 8 oz (50.576 kg) IBW/kg (Calculated) : 82.2   Vital Signs: Temp: 98.4 F (36.9 C) (11/22 1745) Temp Source: Oral (11/22 1745) BP: 114/71 mmHg (11/22 1745) Pulse Rate: 97 (11/22 1745) Intake/Output from previous day:   Intake/Output from this shift:    Labs:  Recent Labs  10/02/15 1140  WBC 13.8*  HGB 10.9*  PLT 277  CREATININE 0.47*   Estimated Creatinine Clearance: 71.2 mL/min (by C-G formula based on Cr of 0.47). No results for input(s): VANCOTROUGH, VANCOPEAK, VANCORANDOM, GENTTROUGH, GENTPEAK, GENTRANDOM, TOBRATROUGH, TOBRAPEAK, TOBRARND, AMIKACINPEAK, AMIKACINTROU, AMIKACIN in the last 72 hours.   Microbiology: No results found for this or any previous visit (from the past 720 hour(s)).  Medical History: Past Medical History  Diagnosis Date  . Hypertension   . Pneumonia     2004  . GERD (gastroesophageal reflux disease)   . Arthritis   . Anemia     blood transfusion 2012  . Bleeding ulcer   . Machinery accident     LEG INJURY  . Cancer ([place])     mouth 2006 then again 2016    Medications:  Scheduled:  . acidophilus  1 capsule Oral QHS  . citalopram  20 mg Oral Daily  . enoxaparin (LOVENOX) injection  40 mg Subcutaneous Q24H  . fentaNYL  50 mcg Transdermal Q72H  . [START ON 10/03/2015] levofloxacin (LEVAQUIN) IV  750 mg Intravenous Q24H  . lisinopril  10 mg Oral QHS  . magic mouthwash  5 mL Oral Once  . pantoprazole  40 mg Oral BID  . piperacillin-tazobactam (ZOSYN)  IV  3.375 g Intravenous Once  . pravastatin  40 mg Oral QHS  . [START ON 10/03/2015] predniSONE  30 mg Oral Q breakfast   Followed by  . [START ON 10/04/2015] predniSONE  20 mg Oral Q breakfast   Followed by  . [START ON 10/05/2015] predniSONE  10 mg Oral Q breakfast  . traZODone  100 mg Oral  QHS   Infusions:  . sodium chloride 75 mL/hr at 10/02/15 1800   Assessment: Pharmacy consulted to renally adjust antibiotics in a 59 yo male admitted for pneumonia.  Currently order Levaquin 750 mg IV q24h for 5 days.   Est CrCl~71.2 mL/min  Goal of Therapy:  Resolution of infection  Plan:  Continue current orders for Levaquin 750 mg IV q24h x 5 days for treatment of CAP.  No renal adjustments warranted at this time.    Avneet Ashmore G 10/02/2015,7:20 PM

## 2015-10-02 NOTE — Telephone Encounter (Signed)
Patient had fall this morning due to weakness. Able to eat and drink minimal amount. Pt's wife stated is going to take pt to ER for evaluation.

## 2015-10-02 NOTE — ED Notes (Signed)
Had chemo 10 days ago for throat ca, radaition last week past few days c/o increaasing weakness, no solid food intake, due to throat soreness

## 2015-10-02 NOTE — Progress Notes (Signed)
Patient arrived on unit near shift end  VSS, patient c/o pain x1 relieved well with prn Oxycodone 30 mg Patient continues on Iv  ABX and IV  NS Blood cultures sent in ED Continue to monitor

## 2015-10-02 NOTE — ED Notes (Signed)
Dr. Reather Laurence notified of cultures not being drawn before the first antibiotics given. Cultures have been drawn and second antibiotic, Zosyn is being hung.

## 2015-10-02 NOTE — ED Notes (Signed)
Few days ago developed weakness, green prod cough

## 2015-10-02 NOTE — ED Notes (Signed)
Patient transported to CT 

## 2015-10-03 ENCOUNTER — Inpatient Hospital Stay: Payer: Medicare Other

## 2015-10-03 LAB — STREP PNEUMONIAE URINARY ANTIGEN: Strep Pneumo Urinary Antigen: NEGATIVE

## 2015-10-03 MED ORDER — PHENOL 1.4 % MT LIQD
1.0000 | OROMUCOSAL | Status: DC | PRN
  Filled 2015-10-03: qty 177

## 2015-10-03 MED ORDER — MORPHINE SULFATE (PF) 2 MG/ML IV SOLN
2.0000 mg | INTRAVENOUS | Status: DC | PRN
  Administered 2015-10-03 – 2015-10-04 (×3): 2 mg via INTRAVENOUS
  Filled 2015-10-03 (×3): qty 1

## 2015-10-03 MED ORDER — NYSTATIN 100000 UNIT/ML MT SUSP
5.0000 mL | Freq: Four times a day (QID) | OROMUCOSAL | Status: DC
  Administered 2015-10-03 – 2015-10-08 (×12): 500000 [IU] via ORAL
  Filled 2015-10-03 (×18): qty 5

## 2015-10-03 NOTE — Clinical Social Work Note (Signed)
Clinical Social Worker consult for "discharge planning needs." CSW discussed pt with RN, RNCM, and Agricultural consultant during progression. Discharge plan is pending. Full assessment to follow, if appropriate. CSW will continue to follow.   Darden Dates, MSW, [place] Social Worker  [phone]

## 2015-10-03 NOTE — Evaluation (Addendum)
Objective Swallowing Evaluation: Bedside Swallow Evaluation  Patient Details  Name: Grant Lee MRN: [id number] Date of Birth: [birth year]  Today's Date: 10/03/2015 Time: SLP Start Time (ACUTE ONLY): 1400-SLP Stop Time (ACUTE ONLY): 1500 SLP Time Calculation (min) (ACUTE ONLY): 60 min  Past Medical History:  Past Medical History  Diagnosis Date  . Hypertension   . Pneumonia     2004  . GERD (gastroesophageal reflux disease)   . Arthritis   . Anemia     blood transfusion 2012  . Bleeding ulcer   . Machinery accident     LEG INJURY  . Cancer [facility])     mouth 2006 then again 2016   Past Surgical History:  Past Surgical History  Procedure Laterality Date  . Leg surgery    . Joint replacement      right hip  . Tongue biopsy    . Tongue surgery      multiple surgeries  . Direct laryngoscopy N/A 06/26/2015    Procedure: Laryngoscopy with tongue base biopsy ;  Surgeon: Beverly Gust, MD;  Location: [facility] ORS;  Service: ENT;  Laterality: N/A;  . Esophagogastroduodenoscopy N/A 07/06/2015    Procedure: ESOPHAGOGASTRODUODENOSCOPY (EGD) and percutaneous gastrostomy tyube placement;  Surgeon: Lollie Sails, MD;  Location: [facility] ENDOSCOPY;  Service: Endoscopy;  Laterality: N/A;  Residual need to be done in the afternoon on 07/06/2015. Combined procedure with Dr. Gustavo Lah and Dr Rayann Heman  . Peripheral vascular catheterization N/A 07/27/2015    Procedure: Glori Luis Cath Insertion;  Surgeon: Katha Cabal, MD;  Location: [place] CV LAB;  Service: Cardiovascular;  Laterality: N/A;  . Esophagogastroduodenoscopy (egd) with propofol N/A 08/07/2015    Procedure: ESOPHAGOGASTRODUODENOSCOPY (EGD) WITH PROPOFOL;  Surgeon: Lollie Sails, MD;  Location: [facility] ENDOSCOPY;  Service: Endoscopy;  Laterality: N/A;   HPI: Pt is a 59 y.o. 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 He presents to the ED with increasing throat pain difficulty eating and poor by mouth intake. CXR revealed multilobar pneumonia - R lobe, suspect aspiration pneumonia. Pt indicated he had been eating/drinking by mouth "fine" and taking his pills "fine". Unsure of his level of understanding of his swallowing deficits as he recently had a MBSS last admission which did not go well. Pt indicated he did not want "another feeding tube in my stomach again".   Subjective: pt awake, verbally conversive. Wet vocal quality noted during conversation. Pt was able to describe his swallowing and problems w/ it sec. to the Ca and Radiation tx recently. He indicated he had a recent swallow study which "didn't go that well". He also stated his PEG "came out" 2 weeks ago.   Subjective: Chief complaint: dysphagia   Objective:  Radiological Procedure: A videoflouroscopic evaluation of oral-preparatory, reflex initiation, and pharyngeal phases of the swallow was performed; as well as a screening of the upper esophageal phase.  I. POSTURE: upright II. VIEW: lateral III. COMPENSATORY STRATEGIES: multiple swallows; throat clear/cough to clear airway; breath hold, chin tuck while swallowing; alternating foods and liquids IV. BOLUSES ADMINISTERED:  Thin Liquid: 5 trials  Nectar-thick Liquid: 1 trial  Honey-thick Liquid: NT  Puree: 2 trials  Mechanical Soft: NT V. RESULTS OF EVALUATION: A. ORAL PREPARATORY PHASE: (The lips, tongue, and velum are observed for strength and coordination)       **  Overall Severity Rating: MILD. Noted premature spillage/bolus loss of liquids over BOT spilling into the pharynx. Pt appeared to clear adequately w/ trials of liquids and purees.   B. SWALLOW INITIATION/REFLEX: (The reflex is normal if "triggered" by the time the bolus reached the base of the  tongue)  **Overall Severity Rating: SEVERE. Delayed pharyngeal swallow initiation noted w/ liquids; purees as well spilling to the pyriform sinuses inconsistently. This resulted in laryngeal penetration followed by aspiration w/ po trials, moreso liquids. Strategy of chin tuck and breath hold during swallowing was not effective to prevent laryngeal penetration or aspiration from occuring.   C. PHARYNGEAL PHASE: (Pharyngeal function is normal if the bolus shows rapid, smooth, and continuous transit through the pharynx and there is no pharyngeal residue after the swallow)  **Overall Severity Rating: SEVERE. Moderate+ pharyngeal residue noted post initial swallow w/ all consistencies. Pt exhibited decreased pharyngeal pressure and decreased laryngeal excursion to clear the bolus residue in the pharynx. Noted laryngeal penetration followed by aspiration of this residue. Pt used multiple swallows to aid pharyngeal clearing but it only reduced the residue; did not clear it.    D. LARYNGEAL PENETRATION: (Material entering into the laryngeal inlet/vestibule but not aspirated): x2 w/ thin liquids; Nectar liquids; puree.  E. ASPIRATION: thin liquids immediately x3; laryngeal penetration became aspiration F. ESOPHAGEAL PHASE: (Screening of the upper esophagus): appeared to have decreased motility of the cervical Esophagus w/ min. retention of bolus material. Alternating foods and liquids and multiple swallows appeared to aid reduction of residue in this area.     ASSESSMENT: Pt appears to present w/ Severe pharyngeal phase deficits of swallowing; mild oral phase deficits of swallowing. The degree of pharyngeal phase dysphagia c/b delayed pharyngeal swallow initiation and decreased laryngeal excursion and pharyngeal pressure(BOT contact/strength to the pharyngeal wall) resulted in laryngeal Penetration and Aspiration of bolus material of both food and liquids. Pt was unable to consistently protect his airway, and this  could result in significant decline in respiratory status including aspiration pneumonia. Suspect pt's oropharyngeal phase dysphagia is directly related to his tongue/throat Ca w/ Radiation tx to this area.  Due to pt's inability to protect his airway, recommend NPO status w/ alternative means of feeding. Rec. F/u ST services Outpt after discharge for dysphagia tx exercises to hopefully improve oropharyngeal swallowing for an oral diet in future.    PLAN/RECOMMENDATIONS:  A. Diet: NPO; frequent oral care QID  B. Swallowing Precautions: aspiration precautions  C. Recommended consultation to: GI - alternative means of feeding  D. Therapy recommendations: Outpt f/u for dysphagia tx  E. Results and recommendations were: discussed w/ pt and MD; CM  CHL IP CLINICAL IMPRESSIONS 10/03/2015  Therapy Diagnosis Severe pharyngeal phase dysphagia  Clinical Impression --  Impact on safety and function --      CHL IP TREATMENT RECOMMENDATION 10/03/2015  Treatment Recommendations No treatment recommended at this time     Prognosis 10/03/2015  Prognosis for Safe Diet Advancement Guarded  Barriers to Reach Goals Severity of deficits  Barriers/Prognosis Comment --    CHL IP DIET RECOMMENDATION 10/03/2015  SLP Diet Recommendations NPO  Liquid Administration via --  Medication Administration Via alternative means  Compensations --  Postural Changes --      CHL IP OTHER RECOMMENDATIONS 10/03/2015  Recommended Consults Consider GI evaluation  Oral Care Recommendations Oral care QID;Patient independent with oral care  Other Recommendations --      CHL IP FOLLOW UP RECOMMENDATIONS 10/03/2015  Follow up Recommendations Outpatient SLP  CHL IP FREQUENCY AND DURATION 07/04/2015  Speech Therapy Frequency (ACUTE ONLY) min 3x week  Treatment Duration 1 week                   Orinda Kenner, MS, CCC-SLP  Haizel Gatchell 10/03/2015, 4:10 PM

## 2015-10-03 NOTE — Consult Note (Signed)
Patient CT showed severe colonic wall thickening and inflammatory change most likely due to ischemic colitis, R/O infectious colitis.  When stool studies come back if no campylobacter can stop the zithromax.  Would continue the cipro and flagyl.

## 2015-10-03 NOTE — Plan of Care (Signed)
Problem: Health Behavior/Discharge Planning: Goal: Ability to manage health-related needs will improve Outcome: Not Progressing Failed swallowing study-feeding tube to be replaced  Problem: Pain Managment: Goal: General experience of comfort will improve Outcome: Progressing Pain med given x1 with improvement  Problem: Physical Regulation: Goal: Will remain free from infection Outcome: Progressing No signs of infection at this time  Problem: Skin Integrity: Goal: Risk for impaired skin integrity will decrease Outcome: Progressing Pink foam intact to sacrum prophylacticly  Problem: Activity: Goal: Risk for activity intolerance will decrease Outcome: Not Progressing 1-2 person assist up to bsc  Problem: Nutrition: Goal: Adequate nutrition will be maintained Outcome: Not Progressing Failed swallowing study  Problem: Bowel/Gastric: Goal: Will not experience complications related to bowel motility Outcome: Progressing BM this shift  Problem: Spiritual Needs Goal: Ability to function at adequate level Outcome: Not Progressing Generalized weakness

## 2015-10-03 NOTE — Consult Note (Signed)
GI Inpatient Consult Note  Reason for Consult:Severe dysphagia due to neck cancer and previous tongue cancer, severe weight loss   Attending Requesting Consult:  History of Present Illness: Grant Lee is a 59 y.o. male with a history of cancer of mouth in 2006 and again in 2016.  He had a gastric feeding tube placed but it came out 2 weeks ago and he flunked swallowing test and had aspiration.  He presents with multi-lobar pneumonia on CT and Right lower lobe infiltrate on CXR.  He has lost 55 lobs in the last 6 months.  Past Medical History:  Past Medical History  Diagnosis Date  . Hypertension   . Pneumonia     2004  . GERD (gastroesophageal reflux disease)   . Arthritis   . Anemia     blood transfusion 2012  . Bleeding ulcer   . Machinery accident     LEG INJURY  . Cancer [facility])     mouth 2006 then again 2016    Problem List: Patient Active Problem List   Diagnosis Date Noted  . Pneumonia 10/02/2015  . Malignant neoplasm of tongue ([place]) 08/30/2015  . PUD (peptic ulcer disease) 08/08/2015  . Acute esophagitis 08/08/2015  . Chronic pain syndrome 08/08/2015  . Esophagitis 08/08/2015  . Chemotherapy induced nausea and vomiting 08/03/2015  . N&V (nausea and vomiting) 08/03/2015  . Cancer of tongue ([place])   . Protein-calorie malnutrition, severe ([place]) 07/05/2015  . Severe protein-calorie malnutrition Altamease Oiler: less than 60% of standard weight) ([place]) 07/05/2015  . Malnutrition ([place]) 07/03/2015  . Aspiration pneumonitis ([place]) 07/03/2015  . Pneumonitis due to inhalation of food or vomitus ([place]) 07/03/2015  . DU (duodenal ulcer) 06/11/2015  . Tongue cancer ([place]) 06/11/2015  . Duodenal ulcer without hemorrhage or perforation 06/11/2015  . Abnormal loss of weight 10/09/2014  . Can't get food down 10/09/2014    Past Surgical History: Past Surgical History  Procedure Laterality Date  . Leg surgery    . Joint replacement      right hip  . Tongue biopsy    . Tongue  surgery      multiple surgeries  . Direct laryngoscopy N/A 06/26/2015    Procedure: Laryngoscopy with tongue base biopsy ;  Surgeon: Beverly Gust, MD;  Location: [facility] ORS;  Service: ENT;  Laterality: N/A;  . Esophagogastroduodenoscopy N/A 07/06/2015    Procedure: ESOPHAGOGASTRODUODENOSCOPY (EGD) and percutaneous gastrostomy tyube placement;  Surgeon: Lollie Sails, MD;  Location: [facility] ENDOSCOPY;  Service: Endoscopy;  Laterality: N/A;  Residual need to be done in the afternoon on 07/06/2015. Combined procedure with Dr. Gustavo Lah and Dr Rayann Heman  . Peripheral vascular catheterization N/A 07/27/2015    Procedure: Glori Luis Cath Insertion;  Surgeon: Katha Cabal, MD;  Location: [place] CV LAB;  Service: Cardiovascular;  Laterality: N/A;  . Esophagogastroduodenoscopy (egd) with propofol N/A 08/07/2015    Procedure: ESOPHAGOGASTRODUODENOSCOPY (EGD) WITH PROPOFOL;  Surgeon: Lollie Sails, MD;  Location[facility] ENDOSCOPY;  Service: Endoscopy;  Laterality: N/A;    Allergies: No Known Allergies  Home Medications: Prescriptions prior to admission  Medication Sig Dispense Refill Last Dose  . acetaminophen (TYLENOL) 325 MG tablet Take 650 mg by mouth every 6 (six) hours as needed for mild pain.   10/02/2015 at 0700  . acidophilus (RISAQUAD) CAPS capsule Take 1 capsule by mouth at bedtime.    10/01/2015 at Unknown time  . Benzocaine (ANBESOL MT) 1 application by Other route as needed (for gum pain).   Past  Week at Unknown time  . citalopram (CELEXA) 20 MG tablet Take 1 tablet (20 mg total) by mouth daily. 30 tablet 3 10/02/2015 at Unknown time  . fentaNYL (DURAGESIC - DOSED MCG/HR) 50 MCG/HR Place 1 patch (50 mcg total) onto the skin every 3 (three) days. 10 patch 0 10/01/2015 at 1100  . lisinopril (PRINIVIL,ZESTRIL) 10 MG tablet Take 10 mg by mouth at bedtime.    10/01/2015 at Unknown time  . nystatin (MYCOSTATIN) 100000 UNIT/ML suspension Take 5 mLs by mouth as needed (for thrush).   PRN at PRN  .  ondansetron (ZOFRAN) 4 MG tablet Take 1 tablet (4 mg total) by mouth every 4 (four) hours as needed for nausea or vomiting. 45 tablet 0 Past Month at Unknown time  . oxycodone (ROXICODONE) 30 MG immediate release tablet Take 30 mg by mouth See admin instructions. Pt is able to take up to seven times a day as needed for pain.   10/02/2015 at 1200  . pantoprazole (PROTONIX) 40 MG tablet Take 1 tablet (40 mg total) by mouth 2 (two) times daily. 60 tablet 3 10/02/2015 at Unknown time  . phenol (CHLORASEPTIC) 1.4 % LIQD Use as directed 1 spray in the mouth or throat as needed for throat irritation / pain. 177 mL 0 10/02/2015 at Unknown time  . pravastatin (PRAVACHOL) 40 MG tablet Take 40 mg by mouth at bedtime.    10/01/2015 at Unknown time  . predniSONE (DELTASONE) 10 MG tablet Take 10-60 mg by mouth See admin instructions. Pt is to take six tablets on day 1, five tablets on day 2, four tablets on day 3, three tablets on day 4, two tablets on day 5, and one tablet on day 6.   10/02/2015 at Unknown time  . traZODone (DESYREL) 100 MG tablet Take 100 mg by mouth at bedtime.   10/01/2015 at Unknown time  . chlorhexidine (PERIDEX) 0.12 % solution 15 mLs by Mouth Rinse route 2 (two) times daily. (Patient not taking: Reported on 10/02/2015) 120 mL 0 Taking  . dexamethasone (DECADRON) 4 MG tablet Take 1 tablet (4 mg total) by mouth 2 (two) times daily with a meal. 1 tablet BID through 11/14.  Take 1 tablet daily 11/15-11/21.  Then take 1 tablet every other day until finished. (Patient not taking: Reported on 10/02/2015) 21 tablet 0 Taking  . lidocaine-prilocaine (EMLA) cream Apply 1 application topically as needed. (Patient not taking: Reported on 10/02/2015) 30 g 3 Taking  . promethazine (PHENERGAN) 25 MG suppository Place 1 suppository (25 mg total) rectally every 8 (eight) hours as needed for nausea or vomiting. (Patient not taking: Reported on 10/02/2015) 12 each 0 Taking  . sucralfate (CARAFATE) 1 GM/10ML  suspension Take 10 mLs (1 g total) by mouth 4 (four) times daily -  with meals and at bedtime. (Patient not taking: Reported on 10/02/2015) 420 mL 0 Taking   Home medication reconciliation was completed with the patient.   Scheduled Inpatient Medications:   . acidophilus  1 capsule Oral QHS  . citalopram  20 mg Oral Daily  . enoxaparin (LOVENOX) injection  40 mg Subcutaneous Q24H  . fentaNYL  50 mcg Transdermal Q72H  . levofloxacin (LEVAQUIN) IV  750 mg Intravenous Q24H  . lisinopril  10 mg Oral QHS  . magic mouthwash  5 mL Oral Once  . nystatin  5 mL Oral QID  . pantoprazole  40 mg Oral BID  . pravastatin  40 mg Oral QHS  . [START ON 10/04/2015]  predniSONE  20 mg Oral Q breakfast   Followed by  . [START ON 10/05/2015] predniSONE  10 mg Oral Q breakfast  . traZODone  100 mg Oral QHS    Continuous Inpatient Infusions:   . sodium chloride 75 mL/hr at 10/03/15 0851    PRN Inpatient Medications:  acetaminophen (TYLENOL) oral liquid 160 mg/5 mL, [DISCONTINUED] acetaminophen **OR** acetaminophen, acetaminophen, benzocaine, morphine injection, nystatin, ondansetron **OR** ondansetron (ZOFRAN) IV, oxycodone, phenol, phenol, sodium chloride  Family History: family history includes CAD in his father and mother.  The patient's family history is negative for inflammatory bowel disorders, GI malignancy, or solid organ transplantation.  Social History:   reports that he quit smoking about a year ago. His smoking use included Cigarettes. He smoked 1.00 pack per day for 0 years. He does not have any smokeless tobacco history on file. He reports that he drinks about 7.2 oz of alcohol per week. He reports that he does not use illicit drugs. The patient denies ETOH, tobacco, or drug use.   Review of Systems: Constitutional: Severe weight loss of 55 lbs over 6 months. Eyes: No changes in vision. ENT: Very sore tongue and throat unable to swallow anything other than sips of water and occasional  pills but he has aspiration. GI: see HPI.  Heme/Lymph: No easy bruising.  CV: No chest pain.  GU: No hematuria.  Integumentary: No rashes.  Neuro: No headaches.  Psych: No depression/anxiety.  Endocrine: No heat/cold intolerance.  Allergic/Immunologic: No urticaria.  Resp: No cough, SOB.  Musculoskeletal: No joint swelling.    Physical Examination: BP 100/67 mmHg  Pulse 103  Temp(Src) 97.8 F (36.6 C) (Oral)  Resp 18  Ht '6\' 2"'$  (1.88 m)  Wt 50.576 kg (111 lb 8 oz)  BMI 14.31 kg/m2  SpO2 83% Gen: NAD, alert and oriented x 4, severe emaciated HEENT: PEERLA, EOMI, Neck: supple, no JVD or thyromegaly Chest: decreased breath sounds  Left anterior area with E to A changes (egophony)  CV: RRR, no m/g/c/r Abd: soft, NT, ND, +BS in all four quadrants; no HSM, guarding, ridigity, or rebound tenderness, very emaciated, old scar present. Ext: no edema, well perfused with 2+ pulses, Skin: no rash or lesions noted Lymph: no LAD  Data: Lab Results  Component Value Date   WBC 13.8* 10/02/2015   HGB 10.9* 10/02/2015   HCT 33.6* 10/02/2015   MCV 95.0 10/02/2015   PLT 277 10/02/2015    Recent Labs Lab 09/27/15 0948 10/02/15 1140  HGB 11.8* 10.9*   Lab Results  Component Value Date   NA 135 10/02/2015   K 3.9 10/02/2015   CL 103 10/02/2015   CO2 24 10/02/2015   BUN 26* 10/02/2015   CREATININE 0.47* 10/02/2015   Lab Results  Component Value Date   ALT 10* 10/02/2015   AST 13* 10/02/2015   ALKPHOS 61 10/02/2015   BILITOT 0.5 10/02/2015   No results for input(s): APTT, INR, PTT in the last 168 hours. Assessment/Plan: Grant Lee is a 59 y.o. male With tongue and mouth cancer with dislodgement of feeding tube 2 weeks ago now with multifocal pneumonia likely due to aspiration.  Has severe weight loss.  Recommendations:It will be at least 2 days before feeding tube can be replaced.  It also depends on his respiratory status as to when it can be done.  I recommend use his port  for some TPN while waiting on the PEG replacement  Thank you for the consult. Please call with questions  or concerns.  Gaylyn Cheers, MD

## 2015-10-03 NOTE — Progress Notes (Signed)
  ANTIBIOTIC CONSULT NOTE - INITIAL  Pharmacy Consult for Renal adjust antibiotics Indication: pneumonia  No Known Allergies  Patient Measurements: Height: '6\' 2"'$  (188 cm) Weight: 111 lb 8 oz (50.576 kg) IBW/kg (Calculated) : 82.2  Vital Signs: Temp: 98.3 F (36.8 C) (11/23 0537) Temp Source: Oral (11/23 0537) BP: 123/80 mmHg (11/23 0537) Pulse Rate: 95 (11/23 0537) Intake/Output from previous day: 11/22 0701 - 11/23 0700 In: 915 [I.V.:915] Out: 650 [Urine:650] Intake/Output from this shift:    Labs:  Recent Labs  10/02/15 1140  WBC 13.8*  HGB 10.9*  PLT 277  CREATININE 0.47*   Estimated Creatinine Clearance: 71.2 mL/min (by C-G formula based on Cr of 0.47).  Microbiology: Recent Results (from the past 720 hour(s))  Culture, blood (routine x 2) Call MD if unable to obtain prior to antibiotics being given     Status: None (Preliminary result)   Collection Time: 10/02/15  5:34 PM  Result Value Ref Range Status   Specimen Description BLOOD LEFT ASSIST CONTROL  Final   Special Requests BOTTLES DRAWN AEROBIC AND ANAEROBIC 5CC  Final   Culture NO GROWTH < 24 HOURS  Final   Report Status PENDING  Incomplete  Culture, blood (routine x 2) Call MD if unable to obtain prior to antibiotics being given     Status: None (Preliminary result)   Collection Time: 10/02/15  5:39 PM  Result Value Ref Range Status   Specimen Description BLOOD RIGHT PORTA CATH  Final   Special Requests BOTTLES DRAWN AEROBIC AND ANAEROBIC 5CC  Final   Culture NO GROWTH < 24 HOURS  Final   Report Status PENDING  Incomplete  Culture, sputum-assessment     Status: None   Collection Time: 10/02/15  7:29 PM  Result Value Ref Range Status   Specimen Description SPUTUM  Final   Special Requests NONE  Final   Sputum evaluation THIS SPECIMEN IS ACCEPTABLE FOR SPUTUM CULTURE  Final   Report Status 10/02/2015 FINAL  Final  Culture, respiratory (NON-Expectorated)     Status: None (Preliminary result)   Collection Time: 10/02/15  7:29 PM  Result Value Ref Range Status   Specimen Description SPUTUM  Final   Special Requests NONE Reflexed from [id number]  Final   Gram Stain PENDING  Incomplete   Culture TOO YOUNG TO READ  Final   Report Status PENDING  Incomplete    Assessment: Pharmacy consulted to renally adjust antibiotics in a 59 yo male admitted for pneumonia. Patient has a history of throat/lung cancer with last chemo/radiation treatment ~10 days prior to admission.   Patient currently on day #2 levofloxacin 750 mg IV q24h for 5 day duration of therapy.   Est CrCl ~71 mL/min  Plan:  Continue levofloxacin 750 mg IV daily x 5 days.  No renal adjustments warranted at this time.   Grant Lee 10/03/2015,11:25 AM

## 2015-10-03 NOTE — Progress Notes (Signed)
   10/03/15 1000  Clinical Encounter Type  Visited With Patient and family together  Visit Type Initial  Referral From Nurse  Consult/Referral To Chaplain  Spiritual Encounters  Spiritual Needs Literature  Advance Directives (For Healthcare)  Does patient have an advance directive? No  Would patient like information on creating an advanced directive? Yes - Scientist, clinical (histocompatibility and immunogenetics) given  Provided pastoral presence, education and forms for Advance Directive.  Pt and family will discuss and call chaplain when ready to complete.  Informed pt and family that it may not be able to be completed till Friday due to upcoming Thanksgiving holiday.  Pt and family expressed understanding.  [place] [phone]

## 2015-10-03 NOTE — Progress Notes (Signed)
Patient ID: BOYKIN BAETZ, male   DOB: January 27, 1956, 59 y.o.   MRN: [id number] [facility] [place] NAME: Grant Lee    MR#:  [id number]  DATE OF BIRTH:  Apr 25, 1956  SUBJECTIVE:  Throat pain. Coughing up brown phlegm  REVIEW OF SYSTEMS:   Review of Systems  Constitutional: Negative for fever, chills and weight loss.  HENT: Positive for sore throat. Negative for ear discharge, ear pain and nosebleeds.   Eyes: Negative for blurred vision, pain and discharge.  Respiratory: Positive for cough, sputum production and shortness of breath. Negative for wheezing and stridor.   Cardiovascular: Negative for chest pain, palpitations, orthopnea and PND.  Gastrointestinal: Negative for nausea, vomiting, abdominal pain and diarrhea.  Genitourinary: Negative for urgency and frequency.  Musculoskeletal: Negative for back pain and joint pain.  Neurological: Negative for sensory change, speech change, focal weakness and weakness.  Psychiatric/Behavioral: Negative for depression and hallucinations. The patient is not nervous/anxious.   All other systems reviewed and are negative.  Tolerating Diet:npo   DRUG ALLERGIES:  No Known Allergies  VITALS:  Blood pressure 123/80, pulse 95, temperature 98.3 F (36.8 C), temperature source Oral, resp. rate 18, height '6\' 2"'$  (1.88 m), weight 50.576 kg (111 lb 8 oz), SpO2 94 %.  PHYSICAL EXAMINATION:   Physical Exam  GENERAL:  59 y.o.-year-old patient lying in the bed with no acute distress. Thin, cachectic EYES: Pupils equal, round, reactive to light and accommodation. No scleral icterus. Extraocular muscles intact.  HEENT: Head atraumatic, normocephalic. Oropharynx and nasopharynx clear.  NECK:  Supple, no jugular venous distention. No thyroid enlargement, no tenderness.  LUNGS: distant breath sounds bilaterally, no wheezing, rales, rhonchi. No use of accessory muscles of respiration. Coarse bs  on right CARDIOVASCULAR: S1, S2 normal. No murmurs, rubs, or gallops.  ABDOMEN: Soft, nontender, nondistended. Bowel sounds present. No organomegaly or mass.  EXTREMITIES: No cyanosis, clubbing or edema b/l.    NEUROLOGIC: Cranial nerves II through XII are intact. No focal Motor or sensory deficits b/l.   PSYCHIATRIC: The patient is alert and oriented x 3.  SKIN: No obvious rash, lesion, or ulcer.    LABORATORY PANEL:   CBC  Recent Labs Lab 10/02/15 1140  WBC 13.8*  HGB 10.9*  HCT 33.6*  PLT 277    Chemistries   Recent Labs Lab 09/27/15 0948 10/02/15 1140  NA 131* 135  K 3.2* 3.9  CL 99* 103  CO2 22 24  GLUCOSE 114* 80  BUN 26* 26*  CREATININE 0.60* 0.47*  CALCIUM 8.7* 8.8*  MG 1.3*  --   AST 18 13*  ALT 12* 10*  ALKPHOS 52 61  BILITOT 0.5 0.5    Cardiac Enzymes  Recent Labs Lab 10/02/15 1140  TROPONINI 0.10*    RADIOLOGY:  Dg Chest 1 View  10/02/2015  CLINICAL DATA:  History throat carcinoma with recent chemo 10 days ago and increasing weakness EXAM: CHEST  1 VIEW COMPARISON:  08/03/2015 FINDINGS: Cardiac shadow is within normal limits. A right chest wall port is again seen. Bibasilar infiltrates worse on the right than the left are now seen new from the prior exam. IMPRESSION: Bibasilar infiltrates right worse than left. Followup PA and lateral chest X-ray is recommended in 3-4 weeks following trial of antibiotic therapy to ensure resolution and exclude underlying malignancy. Electronically Signed   By: Inez Catalina M.D.   On: 10/02/2015 12:03   Ct Angio Chest Pe W/cm &/or Wo  Cm  10/02/2015  CLINICAL DATA:  Remote history of mouth cancer. Current throat cancer. Chemotherapy 10 days ago. Recent radiation. Weakness, hemoptysis today. EXAM: CT ANGIOGRAPHY CHEST WITH CONTRAST TECHNIQUE: Multidetector CT imaging of the chest was performed using the standard protocol during bolus administration of intravenous contrast. Multiplanar CT image reconstructions and  MIPs were obtained to evaluate the vascular anatomy. CONTRAST:  59m OMNIPAQUE IOHEXOL 350 MG/ML SOLN COMPARISON:  Chest x-ray earlier today.  Chest CT 03/23/2014 FINDINGS: No filling defects in the pulmonary arteries to suggest pulmonary emboli. Emphysematous changes in the lungs. Bilateral lower lobe airspace opacities as well as airspace opacity in the right middle lobe. There are innumerable nodular opacities in these areas. This most likely reflects pneumonia, but warrants follow-up after treatment. Mucous plugging in the lower lobe airways bilaterally. Heart is normal size. Coronary artery calcifications noted. Aorta normal caliber with scattered calcifications. Mildly prominent mediastinal lymph nodes. Subcarinal lymph node has a short axis diameter of 11 mm. Right paratracheal lymph node has a short axis diameter of 10 mm. Mildly prominent AP window lymph nodes. Chest wall soft tissues are unremarkable. Imaging into the upper abdomen shows no acute findings. No acute bony abnormality. Review of the MIP images confirms the above findings. IMPRESSION: No evidence of pulmonary embolus. Extensive and airspace disease in the lower lobes bilaterally and right middle lobe with a nodular appearance. Findings most likely reflect multifocal pneumonia. Airway mucous plugging noted in the lower lobes bilaterally. Recommend follow-up after treatment to assure resolution. Mildly prominent mediastinal lymph nodes, likely reactive. Recommend attention on followup imaging. Coronary artery disease. Electronically Signed   By: KRolm BaptiseM.D.   On: 10/02/2015 15:29     ASSESSMENT AND PLAN:   59y.o. 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  1. Multifocal pneumonia ?aspiration IV Levaquin qd Follow blood cultures sputum culture and white count Breathing treatment as needed Remains afebrile  2. Odynophagia Status post radiation therapy completed 10 days ago for throat/tongue cancer chlorSeptic spray,Nystatin swish and swallow Ice chips and meds Speech therapy to see patient Continue by mouth pain meds as needed and patient takes oxycodone immediate release up to 5 times a day Pt may need PEG re-insertio if fails swallow eval  3. Cachexia and malnutrition Dietitian to see patient Patient's PEG tube came off accidentally 2 weeks ago. Patient however was able to take soft diet at home  4.GERD continue Protonix  5. Lovenox for DVT prophylaxis   Case discussed with Care Management/Social Worker. Management plans discussed with the patient, family and they are in agreement.  CODE STATUS: full  DVT Prophylaxis: lovnox  TOTAL TIME TAKING CARE OF THIS PATIENT: 30 minutes.  >50% time spent on counselling and coordination of care pt, family, ST  POSSIBLE D/C IN few DAYS, DEPENDING ON CLINICAL CONDITION.   Tanganika Barradas M.D on 10/03/2015 at 11:56 AM  Between 7am to 6pm - Pager - [phone]  After 6pm go to [email or web address] - password [email or web address]  Office  [phone] CC: Primary care physician; CCyndi Bender PHershal Coria

## 2015-10-03 NOTE — Care Management (Addendum)
Admitted to [facility] with the diagnosis of pneumonia. Lives with wife, Altha Harm  [phone]). Primary care physician is Dr. Tobie Lords. Radiation at [facility]. History of cancer of the tongue. Reconstructive  surgery. PEG tube placed this year. [place] out of place 2 weeks ago.  Mr. Mikami out of room, unavailable to complete assessment. Shelbie Ammons RN MSN CCM Care Management [phone]

## 2015-10-03 NOTE — Plan of Care (Signed)
Problem: Nutrition: Goal: Adequate nutrition will be maintained Outcome: Not Met (add Reason) Pt currently NPO. Still with severe mouth pain. Morphine and Oxycodone given. Also, given Nystatin and Orajel. Pt verbalized some improvement. Will continue to monitor.      

## 2015-10-03 NOTE — Progress Notes (Signed)
Initial Nutrition Assessment  DOCUMENTATION CODES:   Severe malnutrition in context of chronic illness  INTERVENTION:   Coordination of Care: await diet progression as medically able and will follow poc regarding nutrition Medical Food Supplement Therapy: pt prefers [employer] over Ensure   NUTRITION DIAGNOSIS:   Malnutrition related to chronic illness, cancer and cancer related treatments as evidenced by energy intake < or equal to 75% for > or equal to 1 month, percent weight loss, severe depletion of muscle mass, severe depletion of body fat.  GOAL:   Patient will meet greater than or equal to 90% of their needs  MONITOR:    (Energy Intake, anthropometrics, Electrolyte and renal Profile, Digestive System)  REASON FOR ASSESSMENT:   Malnutrition Screening Tool    ASSESSMENT:   Pt admitted wtih severe throat pain and pna. Pt with h/o throat cancer s/p chemotherapy and radiation, last chemotherapy 10 days PTA. Pt reports PEG fell out 2 weeks ago but has been able to take po's until last Thursday (11/17).  Past Medical History  Diagnosis Date  . Hypertension   . Pneumonia     2004  . GERD (gastroesophageal reflux disease)   . Arthritis   . Anemia     blood transfusion 2012  . Bleeding ulcer   . Machinery accident     LEG INJURY  . Cancer [facility])     mouth 2006 then again 2016   Past Surgical History  Procedure Laterality Date  . Leg surgery    . Joint replacement      right hip  . Tongue biopsy    . Tongue surgery      multiple surgeries  . Direct laryngoscopy N/A 06/26/2015    Procedure: Laryngoscopy with tongue base biopsy ;  Surgeon: Beverly Gust, MD;  Location: [facility] ORS;  Service: ENT;  Laterality: N/A;  . Esophagogastroduodenoscopy N/A 07/06/2015    Procedure: ESOPHAGOGASTRODUODENOSCOPY (EGD) and percutaneous gastrostomy tyube placement;  Surgeon: Lollie Sails, MD;  Location: [facility] ENDOSCOPY;  Service: Endoscopy;  Laterality: N/A;   Residual need to be done in the afternoon on 07/06/2015. Combined procedure with Dr. Gustavo Lah and Dr Rayann Heman  . Peripheral vascular catheterization N/A 07/27/2015    Procedure: Glori Luis Cath Insertion;  Surgeon: Katha Cabal, MD;  Location: [place] CV LAB;  Service: Cardiovascular;  Laterality: N/A;  . Esophagogastroduodenoscopy (egd) with propofol N/A 08/07/2015    Procedure: ESOPHAGOGASTRODUODENOSCOPY (EGD) WITH PROPOFOL;  Surgeon: Lollie Sails, MD;  Location: [facility] ENDOSCOPY;  Service: Endoscopy;  Laterality: N/A;    Diet Order:  Diet NPO time specified Except for: Sips with Meds, Ice Chips    Current Nutrition: Pt NPO, c/o throat pain  Food/Nutrition-Related History: Pt reports PEG fell out 2 weeks ago and it has remained out as pt was able to take po's; pt was eating soft foods such as mashed potatoes, pudding, jello. Pt reports while on TF not tolerating Jevity 1.5 cans secondary to vomitting or diarrhea and therefore had not been taking  them regularly prior to PEG falling out. Pt reports being on steroids and eating very well for the past couple of weeks eating small, frequent, soft foods often and drinking Muscle Milk TID until this past Thursday. Pt reports last day of radiation this past Thursday and has since had progressively severe throat pain and has been unable to swallow anything. Pt reports not even being able to get jello down this past Sunday. Pt reports not liking Ensure in the past  but tolerates [employer] on previous admissions.    Scheduled Medications:  . acidophilus  1 capsule Oral QHS  . citalopram  20 mg Oral Daily  . enoxaparin (LOVENOX) injection  40 mg Subcutaneous Q24H  . fentaNYL  50 mcg Transdermal Q72H  . levofloxacin (LEVAQUIN) IV  750 mg Intravenous Q24H  . lisinopril  10 mg Oral QHS  . magic mouthwash  5 mL Oral Once  . nystatin  5 mL Oral QID  . pantoprazole  40 mg Oral BID  . pravastatin  40 mg Oral QHS  . [START ON 10/04/2015]  predniSONE  20 mg Oral Q breakfast   Followed by  . [START ON 10/05/2015] predniSONE  10 mg Oral Q breakfast  . traZODone  100 mg Oral QHS    Continuous Medications:  . sodium chloride 75 mL/hr at 10/03/15 0851     Electrolyte/Renal Profile and Glucose Profile:   Recent Labs Lab 09/27/15 0948 10/02/15 1140  NA 131* 135  K 3.2* 3.9  CL 99* 103  CO2 22 24  BUN 26* 26*  CREATININE 0.60* 0.47*  CALCIUM 8.7* 8.8*  MG 1.3*  --   GLUCOSE 114* 80   Protein Profile:   Recent Labs Lab 09/27/15 0948 10/02/15 1140  ALBUMIN 2.9* 2.1*    Gastrointestinal Profile: Last BM: 10/03/2015   Nutrition-Focused Physical Exam Findings: Nutrition-Focused physical exam completed. Findings are severe fat depletion, severe muscle depletion, and no edema.    Weight Change: Pt reports continued weight loss, down to 111lbs currently. Per [facility] pt with 17% weight loss in the past 2 months.   Skin:  Reviewed, no issues  Height:   Ht Readings from Last 1 Encounters:  10/02/15 '6\' 2"'$  (1.88 m)    Weight:   Wt Readings from Last 1 Encounters:  10/02/15 111 lb 8 oz (50.576 kg)    Wt Readings from Last 10 Encounters:  10/02/15 111 lb 8 oz (50.576 kg)  09/27/15 115 lb 4.8 oz (52.3 kg)  09/13/15 120 lb 2.4 oz (54.5 kg)  09/06/15 123 lb 14.4 oz (56.2 kg)  08/30/15 129 lb 6.6 oz (58.7 kg)  08/22/15 136 lb 3.9 oz (61.8 kg)  08/20/15 133 lb 8 oz (60.555 kg)  08/15/15 134 lb 4.2 oz (60.9 kg)  08/13/15 132 lb 4 oz (59.988 kg)  08/03/15 134 lb (60.782 kg)     Ideal Body Weight:  86 kg  BMI:  Body mass index is 14.31 kg/(m^2).  Estimated Nutritional Needs:   Kcal:  using IBW of 86kg, BEE: 1740kcals, TEE: (IF 1.1-1.3)(AF 1.2) 2296-2714kcals  Protein:  95-111g protein (1.1-1.3g/kg)  Fluid:  2150-2543m of fluid (25-385mkg)  EDUCATION NEEDS:   No education needs identified at this time   HIPecan PlantationRD, LDN Pager ([phone]

## 2015-10-04 LAB — BASIC METABOLIC PANEL
ANION GAP: 9 (ref 5–15)
BUN: 15 mg/dL (ref 6–20)
CALCIUM: 8.1 mg/dL — AB (ref 8.9–10.3)
CHLORIDE: 109 mmol/L (ref 101–111)
CO2: 20 mmol/L — AB (ref 22–32)
CREATININE: 0.38 mg/dL — AB (ref 0.61–1.24)
GFR calc Af Amer: >60 mL/min (ref >60)
GFR calc non Af Amer: >60 mL/min (ref >60)
GLUCOSE: 68 mg/dL (ref 65–99)
Potassium: 3 mmol/L — ABNORMAL LOW (ref 3.5–5.1)
Sodium: 138 mmol/L (ref 135–145)

## 2015-10-04 LAB — GLUCOSE, CAPILLARY
GLUCOSE-CAPILLARY: 46 mg/dL — AB (ref 65–99)
GLUCOSE-CAPILLARY: 54 mg/dL — AB (ref 65–99)
GLUCOSE-CAPILLARY: 74 mg/dL (ref 65–99)
GLUCOSE-CAPILLARY: 167 mg/dL — AB (ref 65–99)
Glucose-Capillary: 40 mg/dL — CL (ref 65–99)
Glucose-Capillary: 75 mg/dL (ref 65–99)

## 2015-10-04 LAB — MAGNESIUM: MAGNESIUM: 1.3 mg/dL — AB (ref 1.7–2.4)

## 2015-10-04 LAB — PHOSPHORUS: Phosphorus: 2.5 mg/dL (ref 2.5–4.6)

## 2015-10-04 LAB — LEGIONELLA PNEUMOPHILA SEROGP 1 UR AG: L. PNEUMOPHILA SEROGP 1 UR AG: NEGATIVE

## 2015-10-04 MED ORDER — METHYLPREDNISOLONE SODIUM SUCC 40 MG IJ SOLR
20.0000 mg | Freq: Once | INTRAMUSCULAR | Status: AC
  Administered 2015-10-04: 20 mg via INTRAVENOUS
  Filled 2015-10-04: qty 1

## 2015-10-04 MED ORDER — METHYLPREDNISOLONE SODIUM SUCC 40 MG IJ SOLR
10.0000 mg | Freq: Once | INTRAMUSCULAR | Status: AC
  Administered 2015-10-05: 10 mg via INTRAVENOUS
  Filled 2015-10-04: qty 1

## 2015-10-04 MED ORDER — POTASSIUM CHLORIDE 10 MEQ/100ML IV SOLN
10.0000 meq | INTRAVENOUS | Status: AC
  Administered 2015-10-04 (×3): 10 meq via INTRAVENOUS
  Filled 2015-10-04 (×3): qty 100

## 2015-10-04 MED ORDER — MAGNESIUM SULFATE 2 GM/50ML IV SOLN
2.0000 g | Freq: Once | INTRAVENOUS | Status: AC
  Administered 2015-10-04: 14:00:00 2 g via INTRAVENOUS
  Filled 2015-10-04: qty 50

## 2015-10-04 MED ORDER — M.V.I. ADULT IV INJ
INJECTION | INTRAVENOUS | Status: DC
  Administered 2015-10-04: 19:00:00 via INTRAVENOUS
  Filled 2015-10-04: qty 960

## 2015-10-04 MED ORDER — INSULIN ASPART 100 UNIT/ML ~~LOC~~ SOLN
0.0000 [IU] | SUBCUTANEOUS | Status: DC
  Administered 2015-10-04 – 2015-10-05 (×2): 2 [IU] via SUBCUTANEOUS
  Administered 2015-10-05: 3 [IU] via SUBCUTANEOUS
  Administered 2015-10-05 (×2): 5 [IU] via SUBCUTANEOUS
  Administered 2015-10-06 (×2): 1 [IU] via SUBCUTANEOUS
  Administered 2015-10-06 (×3): 2 [IU] via SUBCUTANEOUS
  Administered 2015-10-07 – 2015-10-09 (×4): 1 [IU] via SUBCUTANEOUS
  Filled 2015-10-04: qty 1
  Filled 2015-10-04: qty 3
  Filled 2015-10-04: qty 2
  Filled 2015-10-04: qty 5
  Filled 2015-10-04 (×3): qty 2
  Filled 2015-10-04 (×4): qty 1
  Filled 2015-10-04: qty 5
  Filled 2015-10-04: qty 1
  Filled 2015-10-04: qty 2

## 2015-10-04 MED ORDER — DEXTROSE 50 % IV SOLN
12.5000 g | INTRAVENOUS | Status: AC
  Administered 2015-10-04: 12.5 g via INTRAVENOUS
  Filled 2015-10-04: qty 50

## 2015-10-04 MED ORDER — LORAZEPAM 2 MG/ML IJ SOLN
0.5000 mg | Freq: Once | INTRAMUSCULAR | Status: AC
  Administered 2015-10-04: 01:00:00 0.5 mg via INTRAVENOUS
  Filled 2015-10-04: qty 1

## 2015-10-04 MED ORDER — MORPHINE SULFATE (PF) 4 MG/ML IV SOLN
3.0000 mg | INTRAVENOUS | Status: DC | PRN
  Administered 2015-10-04 – 2015-10-10 (×31): 3 mg via INTRAVENOUS
  Filled 2015-10-04 (×31): qty 1

## 2015-10-04 NOTE — Consult Note (Signed)
Subjective: Patient seen for PEG tube placement. Patient seen and examined chart reviewed. Patient has some mild nausea but no emesis. No abdominal pain. He has a history of head and neck cancer and recent chemotherapy therapy and radiation. He had a PEG tube placed about 2 months ago however this came out about 2 weeks ago and he decided that she did not want back in at the time. Currently she is having more issues with difficulty swallowing and was admitted with pneumonia likely aspiration in nature. Swallowing study was done that showed marked pharyngeal dysphagia.  Objective: Vital signs in last 24 hours: Temp:  [97.8 F (36.6 C)-98.4 F (36.9 C)] 98.4 F (36.9 C) (11/24 1252) Pulse Rate:  [85-94] 85 (11/24 1252) Resp:  [18-24] 24 (11/24 1252) BP: (119-136)/(72-80) 136/72 mmHg (11/24 1252) SpO2:  [82 %-93 %] 93 % (11/24 1252) Weight:  [51.211 kg (112 lb 14.4 oz)] 51.211 kg (112 lb 14.4 oz) (11/24 1159) Blood pressure 136/72, pulse 85, temperature 98.4 F (36.9 C), temperature source Oral, resp. rate 24, height '6\' 2"'$  (1.88 m), weight 51.211 kg (112 lb 14.4 oz), SpO2 93 %.   Intake/Output from previous day: 11/23 0701 - 11/24 0700 In: 842.5 [I.V.:842.5] Out: 300 [Urine:300]  Intake/Output this shift: Total I/O In: 100 [IV Piggyback:100] Out: 625 [Urine:625]   General appearance:  59 year old male, cachectic, no distress Resp:  Clear to auscultation Cardio:  Regular rate and rhythm GI:  Soft nontender nondistended bowel sounds positive, scaphoid Extremities:  No clubbing cyanosis or edema   Lab Results: Results for orders placed or performed during the hospital encounter of 10/02/15 (from the past 24 hour(s))  Phosphorus     Status: None   Collection Time: 10/04/15 12:00 PM  Result Value Ref Range   Phosphorus 2.5 2.5 - 4.6 mg/dL  Magnesium     Status: Abnormal   Collection Time: 10/04/15 12:00 PM  Result Value Ref Range   Magnesium 1.3 (L) 1.7 - 2.4 mg/dL  Basic  metabolic panel     Status: Abnormal   Collection Time: 10/04/15 12:00 PM  Result Value Ref Range   Sodium 138 135 - 145 mmol/L   Potassium 3.0 (L) 3.5 - 5.1 mmol/L   Chloride 109 101 - 111 mmol/L   CO2 20 (L) 22 - 32 mmol/L   Glucose, Bld 68 65 - 99 mg/dL   BUN 15 6 - 20 mg/dL   Creatinine, Ser 0.38 (L) 0.61 - 1.24 mg/dL   Calcium 8.1 (L) 8.9 - 10.3 mg/dL   GFR calc non Af Amer >60 >60 mL/min   GFR calc Af Amer >60 >60 mL/min   Anion gap 9 5 - 15  Glucose, capillary     Status: Abnormal   Collection Time: 10/04/15 12:27 PM  Result Value Ref Range   Glucose-Capillary 40 (LL) 65 - 99 mg/dL  Glucose, capillary     Status: Abnormal   Collection Time: 10/04/15 12:30 PM  Result Value Ref Range   Glucose-Capillary 46 (L) 65 - 99 mg/dL  Glucose, capillary     Status: Abnormal   Collection Time: 10/04/15  1:07 PM  Result Value Ref Range   Glucose-Capillary 54 (L) 65 - 99 mg/dL  Glucose, capillary     Status: None   Collection Time: 10/04/15  1:36 PM  Result Value Ref Range   Glucose-Capillary 75 65 - 99 mg/dL      Recent Labs  10/02/15 1140  WBC 13.8*  HGB 10.9*  HCT 33.6*  PLT 277   BMET  Recent Labs  10/02/15 1140 10/04/15 1200  NA 135 138  K 3.9 3.0*  CL 103 109  CO2 24 20*  GLUCOSE 80 68  BUN 26* 15  CREATININE 0.47* 0.38*  CALCIUM 8.8* 8.1*   LFT  Recent Labs  10/02/15 1140  PROT 6.0*  ALBUMIN 2.1*  AST 13*  ALT 10*  ALKPHOS 61  BILITOT 0.5   PT/INR No results for input(s): LABPROT, INR in the last 72 hours. Hepatitis Panel No results for input(s): HEPBSAG, HCVAB, HEPAIGM, HEPBIGM in the last 72 hours. C-Diff No results for input(s): CDIFFTOX in the last 72 hours. No results for input(s): CDIFFPCR in the last 72 hours.   Studies/Results: Ct Angio Chest Pe W/cm &/or Wo Cm  10/02/2015  CLINICAL DATA:  Remote history of mouth cancer. Current throat cancer. Chemotherapy 10 days ago. Recent radiation. Weakness, hemoptysis today. EXAM: CT  ANGIOGRAPHY CHEST WITH CONTRAST TECHNIQUE: Multidetector CT imaging of the chest was performed using the standard protocol during bolus administration of intravenous contrast. Multiplanar CT image reconstructions and MIPs were obtained to evaluate the vascular anatomy. CONTRAST:  48m OMNIPAQUE IOHEXOL 350 MG/ML SOLN COMPARISON:  Chest x-ray earlier today.  Chest CT 03/23/2014 FINDINGS: No filling defects in the pulmonary arteries to suggest pulmonary emboli. Emphysematous changes in the lungs. Bilateral lower lobe airspace opacities as well as airspace opacity in the right middle lobe. There are innumerable nodular opacities in these areas. This most likely reflects pneumonia, but warrants follow-up after treatment. Mucous plugging in the lower lobe airways bilaterally. Heart is normal size. Coronary artery calcifications noted. Aorta normal caliber with scattered calcifications. Mildly prominent mediastinal lymph nodes. Subcarinal lymph node has a short axis diameter of 11 mm. Right paratracheal lymph node has a short axis diameter of 10 mm. Mildly prominent AP window lymph nodes. Chest wall soft tissues are unremarkable. Imaging into the upper abdomen shows no acute findings. No acute bony abnormality. Review of the MIP images confirms the above findings. IMPRESSION: No evidence of pulmonary embolus. Extensive and airspace disease in the lower lobes bilaterally and right middle lobe with a nodular appearance. Findings most likely reflect multifocal pneumonia. Airway mucous plugging noted in the lower lobes bilaterally. Recommend follow-up after treatment to assure resolution. Mildly prominent mediastinal lymph nodes, likely reactive. Recommend attention on followup imaging. Coronary artery disease. Electronically Signed   By: KRolm BaptiseM.D.   On: 10/02/2015 15:29    Scheduled Inpatient Medications:   . enoxaparin (LOVENOX) injection  40 mg Subcutaneous Q24H  . fentaNYL  50 mcg Transdermal Q72H  .  insulin aspart  0-9 Units Subcutaneous 6 times per day  . levofloxacin (LEVAQUIN) IV  750 mg Intravenous Q24H  . magic mouthwash  5 mL Oral Once  . [START ON 10/05/2015] methylPREDNISolone (SOLU-MEDROL) injection  10 mg Intravenous Once  . nystatin  5 mL Oral QID  . potassium chloride  10 mEq Intravenous Q1 Hr x 3    Continuous Inpatient Infusions:   . .Marland KitchenPN (CLINIMIX-E) Adult    . sodium chloride 75 mL/hr at 10/04/15 1143    PRN Inpatient Medications:  [DISCONTINUED] acetaminophen **OR** acetaminophen, benzocaine, morphine injection, [DISCONTINUED] ondansetron **OR** ondansetron (ZOFRAN) IV, phenol, sodium chloride  Miscellaneous:   Assessment:  1. Pharyngeal dysphagia with probable aspiration pneumonia.  Plan:  1. PEG tube placement when clinically feasible. Agree with giving patient several days of antibiotics to improve pulmonary status before doing sedated procedure. Possibly Monday or early  in the week. Discussed with Dr. Posey Pronto.  Lollie Sails MD 10/04/2015, 3:10 PM

## 2015-10-04 NOTE — Progress Notes (Addendum)
Patient ID: Grant Lee, male   DOB: [birth year], 59 y.o.   MRN: [id number] [place] [facility] [place] NAME: Grant Lee    MR#:  [id number]  DATE OF BIRTH:  Jul 06, 1956  SUBJECTIVE:  Throat pain. Coughing up brown phlegm started on TPN   REVIEW OF SYSTEMS:   Review of Systems  Constitutional: Negative for fever, chills and weight loss.  HENT: Positive for sore throat. Negative for ear discharge, ear pain and nosebleeds.   Eyes: Negative for blurred vision, pain and discharge.  Respiratory: Positive for cough, sputum production and shortness of breath. Negative for wheezing and stridor.   Cardiovascular: Negative for chest pain, palpitations, orthopnea and PND.  Gastrointestinal: Negative for nausea, vomiting, abdominal pain and diarrhea.  Genitourinary: Negative for urgency and frequency.  Musculoskeletal: Negative for back pain and joint pain.  Neurological: Negative for sensory change, speech change, focal weakness and weakness.  Psychiatric/Behavioral: Negative for depression and hallucinations. The patient is not nervous/anxious.   All other systems reviewed and are negative.  Tolerating Diet:npo  DRUG ALLERGIES:  No Known Allergies  VITALS:  Blood pressure 120/80, pulse 94, temperature 98.3 F (36.8 C), temperature source Oral, resp. rate 18, height '6\' 2"'$  (1.88 m), weight 51.211 kg (112 lb 14.4 oz), SpO2 93 %.  PHYSICAL EXAMINATION:   Physical Exam  GENERAL:  60 y.o.-year-old patient lying in the bed with no acute distress. Thin, cachectic EYES: Pupils equal, round, reactive to light and accommodation. No scleral icterus. Extraocular muscles intact.  HEENT: Head atraumatic, normocephalic. Oropharynx and nasopharynx clear.  NECK:  Supple, no jugular venous distention. No thyroid enlargement, no tenderness.  LUNGS: distant breath sounds bilaterally, no wheezing, rales, rhonchi. No use of accessory muscles of  respiration. Coarse bs on right CARDIOVASCULAR: S1, S2 normal. No murmurs, rubs, or gallops.  ABDOMEN: Soft, nontender, nondistended. Bowel sounds present. No organomegaly or mass.  EXTREMITIES: No cyanosis, clubbing or edema b/l.    NEUROLOGIC: Cranial nerves II through XII are intact. No focal Motor or sensory deficits b/l.   PSYCHIATRIC: The patient is alert and oriented x 3.  SKIN: No obvious rash, lesion, or ulcer.    LABORATORY PANEL:   CBC  Recent Labs Lab 10/02/15 1140  WBC 13.8*  HGB 10.9*  HCT 33.6*  PLT 277    Chemistries   Recent Labs Lab 10/02/15 1140  NA 135  K 3.9  CL 103  CO2 24  GLUCOSE 80  BUN 26*  CREATININE 0.47*  CALCIUM 8.8*  AST 13*  ALT 10*  ALKPHOS 61  BILITOT 0.5    Cardiac Enzymes  Recent Labs Lab 10/02/15 1140  TROPONINI 0.10*    RADIOLOGY:  Ct Angio Chest Pe W/cm &/or Wo Cm  10/02/2015  CLINICAL DATA:  Remote history of mouth cancer. Current throat cancer. Chemotherapy 10 days ago. Recent radiation. Weakness, hemoptysis today. EXAM: CT ANGIOGRAPHY CHEST WITH CONTRAST TECHNIQUE: Multidetector CT imaging of the chest was performed using the standard protocol during bolus administration of intravenous contrast. Multiplanar CT image reconstructions and MIPs were obtained to evaluate the vascular anatomy. CONTRAST:  7m OMNIPAQUE IOHEXOL 350 MG/ML SOLN COMPARISON:  Chest x-ray earlier today.  Chest CT 03/23/2014 FINDINGS: No filling defects in the pulmonary arteries to suggest pulmonary emboli. Emphysematous changes in the lungs. Bilateral lower lobe airspace opacities as well as airspace opacity in the right middle lobe. There are innumerable nodular opacities in these areas. This most likely reflects pneumonia,  but warrants follow-up after treatment. Mucous plugging in the lower lobe airways bilaterally. Heart is normal size. Coronary artery calcifications noted. Aorta normal caliber with scattered calcifications. Mildly prominent  mediastinal lymph nodes. Subcarinal lymph node has a short axis diameter of 11 mm. Right paratracheal lymph node has a short axis diameter of 10 mm. Mildly prominent AP window lymph nodes. Chest wall soft tissues are unremarkable. Imaging into the upper abdomen shows no acute findings. No acute bony abnormality. Review of the MIP images confirms the above findings. IMPRESSION: No evidence of pulmonary embolus. Extensive and airspace disease in the lower lobes bilaterally and right middle lobe with a nodular appearance. Findings most likely reflect multifocal pneumonia. Airway mucous plugging noted in the lower lobes bilaterally. Recommend follow-up after treatment to assure resolution. Mildly prominent mediastinal lymph nodes, likely reactive. Recommend attention on followup imaging. Coronary artery disease. Electronically Signed   By: Rolm Baptise M.D.   On: 10/02/2015 15:29     ASSESSMENT AND PLAN:   59 y.o. 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  1. Multifocal pneumonia ?aspiration IV Levaquin qd Blood cultures negative  Breathing treatment as needed Remains afebrile  2. Odynophagia Status post radiation therapy completed 10 days ago for throat/tongue cancer chlorSeptic spray,Nystatin swish and swallow Speech therapy evaluation noted. Recommends to stay NPO since pt is aspirating all kinds of food. Pt will need PEG tube placed  3. Cachexia and malnutrition Dietitian to see patient-for TPN started on 10/04/2015  PEG tube to be placed next week.  4.GERD continue Protonix  5. Lovenox for DVT prophylaxis   Case discussed with Care Management/Social  Worker. Management plans discussed with the patient, family and they are in agreement.  CODE STATUS: full  DVT Prophylaxis: lovenox  TOTAL TIME TAKING CARE OF THIS PATIENT: 30 minutes.  >50% time spent on counselling and coordination of care pt, family, ST  POSSIBLE D/C IN few DAYS, DEPENDING ON CLINICAL CONDITION.   Kandra Graven M.D on 10/04/2015 at 12:02 PM  Between 7am to 6pm - Pager - [phone]  After 6pm go to [email or web address] - password [email or web address]  Office  [phone]  CC: Primary care physician; Cyndi Bender, [name]

## 2015-10-04 NOTE — Plan of Care (Signed)
Problem: Pain Managment: Goal: General experience of comfort will improve Pt made strictly NPO yesterday because failed swallow study. Pt became depressed and increasingly agitated because he could not have ice chips. MD notified to change frequency and dose of Morphine since pt unable to take po Oxycodone. Also, Ativan 0.5 given x 1 dose which seemed to help.  Problem: Physical Regulation: Goal: Will remain free from infection Outcome: Progressing WBC count 13.8 on 11/22. No lab work ordered for this am.   Problem: Nutrition: Goal: Adequate nutrition will be maintained Pt currently NPO. Possible peg tube placement tomorrow

## 2015-10-04 NOTE — Progress Notes (Signed)
MEDICATION RELATED CONSULT NOTE - INITIAL   Pharmacy Consult for electrolyte management with TPN Indication: TPN  No Known Allergies  Patient Measurements: Height: '6\' 2"'$  (188 cm) Weight: 112 lb 14.4 oz (51.211 kg) IBW/kg (Calculated) : 82.2  Vital Signs: Temp: 98.4 F (36.9 C) (11/24 1252) Temp Source: Oral (11/24 1252) BP: 136/72 mmHg (11/24 1252) Pulse Rate: 85 (11/24 1252) Intake/Output from previous day: 11/23 0701 - 11/24 0700 In: 842.5 [I.V.:842.5] Out: 300 [Urine:300] Intake/Output from this shift: Total I/O In: -  Out: 350 [Urine:350]  Labs:  Recent Labs  10/02/15 1140 10/04/15 1200  WBC 13.8*  --   HGB 10.9*  --   HCT 33.6*  --   PLT 277  --   CREATININE 0.47* 0.38*  MG  --  1.3*  PHOS  --  2.5  ALBUMIN 2.1*  --   PROT 6.0*  --   AST 13*  --   ALT 10*  --   ALKPHOS 61  --   BILITOT 0.5  --    Estimated Creatinine Clearance: 72 mL/min (by C-G formula based on Cr of 0.38).   Microbiology: Recent Results (from the past 720 hour(s))  Culture, blood (routine x 2) Call MD if unable to obtain prior to antibiotics being given     Status: None (Preliminary result)   Collection Time: 10/02/15  5:34 PM  Result Value Ref Range Status   Specimen Description BLOOD LEFT ASSIST CONTROL  Final   Special Requests BOTTLES DRAWN AEROBIC AND ANAEROBIC 5CC  Final   Culture NO GROWTH 2 DAYS  Final   Report Status PENDING  Incomplete  Culture, blood (routine x 2) Call MD if unable to obtain prior to antibiotics being given     Status: None (Preliminary result)   Collection Time: 10/02/15  5:39 PM  Result Value Ref Range Status   Specimen Description BLOOD RIGHT PORTA CATH  Final   Special Requests BOTTLES DRAWN AEROBIC AND ANAEROBIC 5CC  Final   Culture NO GROWTH 2 DAYS  Final   Report Status PENDING  Incomplete  Culture, sputum-assessment     Status: None   Collection Time: 10/02/15  7:29 PM  Result Value Ref Range Status   Specimen Description SPUTUM  Final   Special Requests NONE  Final   Sputum evaluation THIS SPECIMEN IS ACCEPTABLE FOR SPUTUM CULTURE  Final   Report Status 10/02/2015 FINAL  Final  Culture, respiratory (NON-Expectorated)     Status: None (Preliminary result)   Collection Time: 10/02/15  7:29 PM  Result Value Ref Range Status   Specimen Description SPUTUM  Final   Special Requests NONE Reflexed from [id number]  Final   Gram Stain   Final    MODERATE WBC SEEN FEW GRAM NEGATIVE RODS FEW GRAM POSITIVE COCCI GOOD SPECIMEN - 80-90% WBCS    Culture   Final    LIGHT GROWTH GRAM NEGATIVE RODS IDENTIFICATION AND SUSCEPTIBILITIES TO FOLLOW    Report Status PENDING  Incomplete    Medical History: Past Medical History  Diagnosis Date  . Hypertension   . Pneumonia     2004  . GERD (gastroesophageal reflux disease)   . Arthritis   . Anemia     blood transfusion 2012  . Bleeding ulcer   . Machinery accident     LEG INJURY  . Cancer [facility])     mouth 2006 then again 2016   Assessment: Patient was started on TPN today with TPN scheduled to start at 1800  tonight and followed by dietary. Pharmacy consulted to monitor and supplement electrolytes as needed as well as to order SSI with q4h BG checks.   Patient has throat/tongue cancer and had last chemo/radiation ~10 days prior to admission.   Phosphorus within normal limits at 2.5 K is low at 3.0 Mg is low at 1.3  Goal of Therapy:  Electrolytes within normal limits  Plan:  Give 2 g Mg x1 dose followed by KCl 10 mEq x 3 doses TPN scheduled to begin at 1800 this evening SSI ordered with q4h BG checks per request in consult  Mg/K/Phos ordered with AM labs tomorrow, pharmacy will continue to monitor. Thank you for the consult.  Darylene Price Diannah Rindfleisch 10/04/2015,1:16 PM

## 2015-10-04 NOTE — Progress Notes (Signed)
Nutrition Follow-up  DOCUMENTATION CODES:   Severe malnutrition in context of chronic illness  INTERVENTION:   PN: received consult for TPN from MD Posey Pronto; discussed initiation of TPN via single-lumen implanted port via telephone. Recommend starting 5%AA/20%Dextrose at rate of 40 ml/hr with goal rate of 80 ml/hr providing 96 g of protein, 1920 mL fluid and 1689 kcals. If remains on TPN >1 week, recommend addition of 20% lipids 2x weekly providing additional 286 kcals per day on average. Meets 100% protein needs, >85% calorie need. Pharmacy consult to assist with electrolyte and glucose management. Recommend checking FSBS q 4-6 hours with addition of sliding scale if needed. Recommend checking BMP and phosphorus today with follow-up TPN panel in AM after TPN initiated. Strict I/O and daily weights. Continue to assess   NUTRITION DIAGNOSIS:   Malnutrition related to chronic illness, cancer and cancer related treatments as evidenced by energy intake < or equal to 75% for > or equal to 1 month, percent weight loss, severe depletion of muscle mass, severe depletion of body fat.  GOAL:   Patient will meet greater than or equal to 90% of their needs  MONITOR:    (Energy Intake, anthropometrics, Electrolyte and renal Profile, Digestive System)  REASON FOR ASSESSMENT:   Malnutrition Screening Tool    ASSESSMENT:   Pt admitted wtih severe throat pain and pna. Pt with h/o throat cancer s/p chemotherapy and radiation, last chemotherapy 10 days PTA. Pt reports PEG fell out 2 weeks ago but has been able to take po's until last Thursday (11/17).  Discussed pt with MD Posey Pronto via telephone this AM; per MD, pt failed swallowing evaluation yesterday and must remain NPO. Plan to replace PEG but may be several days before this can happen. Pt with single lumen implanted port; discussed importance of TPN having a designated line; MD reports at this point nothing else to infuse via port except TPN and if this  changes plan to obtain another access  Diet Order:  NPO  Electrolyte and Renal Profile:  Recent Labs Lab 10/02/15 1140  BUN 26*  CREATININE 0.47*  NA 135  K 3.9   Glucose Profile: No results for input(s): GLUCAP in the last 72 hours.  Skin:  Reviewed, no issues   Meds:  NS at 75 ml/hr  Height:   Ht Readings from Last 1 Encounters:  10/02/15 '6\' 2"'$  (1.88 m)    Weight:   Wt Readings from Last 1 Encounters:  10/02/15 111 lb 8 oz (50.576 kg)   Filed Weights   10/02/15 1130 10/02/15 1822  Weight: 112 lb (50.803 kg) 111 lb 8 oz (50.576 kg)    Ideal Body Weight:  86 kg  BMI:  Body mass index is 14.31 kg/(m^2).  Estimated Nutritional Needs:   Kcal:  using IBW of 86kg, BEE: 1740kcals, TEE: (IF 1.1-1.3)(AF 1.2) 2296-2714kcals  Protein:  95-111g protein (1.1-1.3g/kg)  Fluid:  2150-2560m of fluid (25-380mkg)  EDUCATION NEEDS:   No education needs identified at this time  HIBrownsvilleRD, LDN [phone]

## 2015-10-04 NOTE — Plan of Care (Signed)
Problem: Bowel/Gastric: Goal: Will not experience complications related to bowel motility Outcome: Progressing No safety issues/injuries this shift. Pt needs assistance with ADL's due to severe generalized weakness. Morphine adjusted up to '3mg'$  every 4 hours prn with pt experiencing throat/mouth pain with dryness, right hip pain and HA with pt sleeping at frequent intervals. Reports pain 8-9/10. Fentanyl patch changed. Orajel used PRN. Using mouth swabs for nystatin. Bubbler added to 02 for report of nasal dryness. Continued IV antibiotics. Poor activity tolerance; remained at bedrest today; using foley prn independently related to generalized weakness. Pink foam intact on sacral area. Pt educated on turning/repositioning every 1-2 hours. Pt observed staying in supine position despite education. ' Poor nutritional status; Pt now NPO due to failed swallow study/assessment with aspiration/pneumonia. Awaiting feeding tube placement. FSBS's started as part of prep for TPN therapy with pt found to be hypoglycemic/asymptomatic requiring D50 12.5Gm IV x 1 with repeat FSBS's now normal limits. K+ and Magnesium IV supplements infusing for replacements. Pt reports had stool last night.  .     Problem: Spiritual Needs Goal: Ability to function at adequate level Outcome: Progressing No spiritual needs identified. Pt aware of resources.

## 2015-10-05 LAB — CBC WITH DIFFERENTIAL/PLATELET
BASOS PCT: 0 %
Basophils Absolute: 0 10*3/uL (ref 0–0.1)
EOS ABS: 0 10*3/uL (ref 0–0.7)
EOS PCT: 0 %
HCT: 33.3 % — ABNORMAL LOW (ref 40.0–52.0)
HEMOGLOBIN: 10.8 g/dL — AB (ref 13.0–18.0)
Lymphocytes Relative: 9 %
Lymphs Abs: 0.6 10*3/uL — ABNORMAL LOW (ref 1.0–3.6)
MCH: 30.5 pg (ref 26.0–34.0)
MCHC: 32.4 g/dL (ref 32.0–36.0)
MCV: 94.3 fL (ref 80.0–100.0)
MONOS PCT: 6 %
Monocytes Absolute: 0.3 10*3/uL (ref 0.2–1.0)
NEUTROS PCT: 85 %
Neutro Abs: 5.4 10*3/uL (ref 1.4–6.5)
PLATELETS: 222 10*3/uL (ref 150–440)
RBC: 3.53 MIL/uL — ABNORMAL LOW (ref 4.40–5.90)
RDW: 17.6 % — AB (ref 11.5–14.5)
WBC: 6.3 10*3/uL (ref 3.8–10.6)

## 2015-10-05 LAB — COMPREHENSIVE METABOLIC PANEL
ALBUMIN: 1.7 g/dL — AB (ref 3.5–5.0)
ALK PHOS: 63 U/L (ref 38–126)
ALT: 11 U/L — ABNORMAL LOW (ref 17–63)
ANION GAP: 5 (ref 5–15)
AST: 20 U/L (ref 15–41)
BUN: 17 mg/dL (ref 6–20)
CHLORIDE: 106 mmol/L (ref 101–111)
CO2: 24 mmol/L (ref 22–32)
Calcium: 8 mg/dL — ABNORMAL LOW (ref 8.9–10.3)
Creatinine, Ser: 0.41 mg/dL — ABNORMAL LOW (ref 0.61–1.24)
GFR calc non Af Amer: >60 mL/min (ref >60)
GLUCOSE: 227 mg/dL — AB (ref 65–99)
POTASSIUM: 4 mmol/L (ref 3.5–5.1)
SODIUM: 135 mmol/L (ref 135–145)
Total Bilirubin: 0.3 mg/dL (ref 0.3–1.2)
Total Protein: 5.9 g/dL — ABNORMAL LOW (ref 6.5–8.1)

## 2015-10-05 LAB — CULTURE, RESPIRATORY

## 2015-10-05 LAB — TRIGLYCERIDES: TRIGLYCERIDES: 94 mg/dL (ref <150)

## 2015-10-05 LAB — CULTURE, RESPIRATORY W GRAM STAIN

## 2015-10-05 LAB — PHOSPHORUS: PHOSPHORUS: 2.2 mg/dL — AB (ref 2.5–4.6)

## 2015-10-05 LAB — GLUCOSE, CAPILLARY
GLUCOSE-CAPILLARY: 109 mg/dL — AB (ref 65–99)
GLUCOSE-CAPILLARY: 130 mg/dL — AB (ref 65–99)
GLUCOSE-CAPILLARY: 164 mg/dL — AB (ref 65–99)
GLUCOSE-CAPILLARY: 238 mg/dL — AB (ref 65–99)
GLUCOSE-CAPILLARY: 265 mg/dL — AB (ref 65–99)
Glucose-Capillary: 266 mg/dL — ABNORMAL HIGH (ref 65–99)

## 2015-10-05 LAB — MAGNESIUM: Magnesium: 1.8 mg/dL (ref 1.7–2.4)

## 2015-10-05 MED ORDER — POTASSIUM PHOSPHATES 15 MMOLE/5ML IV SOLN
20.0000 mmol | Freq: Once | INTRAVENOUS | Status: AC
  Administered 2015-10-05: 20 mmol via INTRAVENOUS
  Filled 2015-10-05: qty 6.67

## 2015-10-05 MED ORDER — TRACE MINERALS CR-CU-MN-SE-ZN 10-1000-500-60 MCG/ML IV SOLN
INTRAVENOUS | Status: AC
  Administered 2015-10-05: 20:00:00 via INTRAVENOUS
  Filled 2015-10-05: qty 1920

## 2015-10-05 NOTE — Progress Notes (Signed)
GI Inpatient Follow-up Note  Patient Identification: Grant Lee is a 59 y.o. male with head and neck ca a/w aspiration pna, PEG tube dislodeged 2 weeks ago  Subjective:  Still lot of cough and SOB.  No f/c.  No abd pain. No rectal bleeding or melena.   Scheduled Inpatient Medications:  . enoxaparin (LOVENOX) injection  40 mg Subcutaneous Q24H  . fentaNYL  50 mcg Transdermal Q72H  . insulin aspart  0-9 Units Subcutaneous 6 times per day  . levofloxacin (LEVAQUIN) IV  750 mg Intravenous Q24H  . magic mouthwash  5 mL Oral Once  . nystatin  5 mL Oral QID    Continuous Inpatient Infusions:   . Marland KitchenTPN (CLINIMIX-E) Adult 80 mL/hr at 10/05/15 2015    PRN Inpatient Medications:  [DISCONTINUED] acetaminophen **OR** acetaminophen, benzocaine, morphine injection, [DISCONTINUED] ondansetron **OR** ondansetron (ZOFRAN) IV, phenol, sodium chloride    Physical Examination: BP 149/96 mmHg  Pulse 63  Temp(Src) 98.1 F (36.7 C) (Oral)  Resp 20  Ht '6\' 2"'$  (1.88 m)  Wt 52.617 kg (116 lb)  BMI 14.89 kg/m2  SpO2 100% Gen: NAD, alert and oriented x 4, appears chronically il  Chest: + decreased breath sounds bilat,  CV: RRR, no m/g/c/r Abd: soft, NT, ND, +BS in all four quadrants; no HSM, guarding, ridigity, or rebound tenderness, prev PEG site c/d/i Ext: no edema, well perfused with 2+ pulses, Skin: no rash or lesions noted Lymph: no LAD  Data: Lab Results  Component Value Date   WBC 6.3 10/05/2015   HGB 10.8* 10/05/2015   HCT 33.3* 10/05/2015   MCV 94.3 10/05/2015   PLT 222 10/05/2015    Recent Labs Lab 10/02/15 1140 10/05/15 0816  HGB 10.9* 10.8*   Lab Results  Component Value Date   NA 135 10/05/2015   K 4.0 10/05/2015   CL 106 10/05/2015   CO2 24 10/05/2015   BUN 17 10/05/2015   CREATININE 0.41* 10/05/2015   Lab Results  Component Value Date   ALT 11* 10/05/2015   AST 20 10/05/2015   ALKPHOS 63 10/05/2015   BILITOT 0.3 10/05/2015   No results for  input(s): APTT, INR, PTT in the last 168 hours. Assessment/Plan: Grant Lee is a 59 y.o. male with aspiration pna, dislodged PEG tube  Recommendations: - PEG replacement next week once resp status more stable - consider interventional radiology placement to eliminate risk of abd wall cancer seeding from PEG sliding through  pharnyx.   Please call with questions or concerns.  REIN, Grace Blight, MD

## 2015-10-05 NOTE — Plan of Care (Signed)
Problem: Spiritual Needs Goal: Ability to function at adequate level Outcome: Progressing Plan of care, progress to goals. 1. Pt with chronic pain of 5/10 with adequate relief with current measures. 2. NPO with TPN running and plans for peg tube in several days. Oral care provided every shift and as needed. 3. Blood sugars every 4 hours with coverage. 4. Pt remains safe and without injury this shift. 5. Skin care provided with foam to elbows secondary to friction from blanket to bilateral elbows. Dorna Bloom RN

## 2015-10-05 NOTE — Progress Notes (Signed)
MEDICATION RELATED CONSULT NOTE - Follow Up   Pharmacy Consult for electrolyte management with TPN Indication: TPN  No Known Allergies  Patient Measurements: Height: '6\' 2"'$  (188 cm) Weight: 116 lb (52.617 kg) IBW/kg (Calculated) : 82.2  Vital Signs: Temp: 97.7 F (36.5 C) (11/25 0457) Temp Source: Oral (11/25 0457) BP: 135/81 mmHg (11/25 0457) Pulse Rate: 54 (11/25 0457) Intake/Output from previous day: 11/24 0701 - 11/25 0700 In: 2997 [I.V.:1868; IV Piggyback:300; TPN:829] Out: 926 [Urine:925; Stool:1] Intake/Output from this shift:    Labs:  Recent Labs  10/04/15 1200 10/05/15 0816  WBC  --  6.3  HGB  --  10.8*  HCT  --  33.3*  PLT  --  222  CREATININE 0.38* 0.41*  MG 1.3* 1.8  PHOS 2.5 2.2*  ALBUMIN  --  1.7*  PROT  --  5.9*  AST  --  20  ALT  --  11*  ALKPHOS  --  63  BILITOT  --  0.3   Estimated Creatinine Clearance: 74 mL/min (by C-G formula based on Cr of 0.41).   Microbiology: Recent Results (from the past 720 hour(s))  Culture, blood (routine x 2) Call MD if unable to obtain prior to antibiotics being given     Status: None (Preliminary result)   Collection Time: 10/02/15  5:34 PM  Result Value Ref Range Status   Specimen Description BLOOD LEFT ASSIST CONTROL  Final   Special Requests BOTTLES DRAWN AEROBIC AND ANAEROBIC 5CC  Final   Culture NO GROWTH 3 DAYS  Final   Report Status PENDING  Incomplete  Culture, blood (routine x 2) Call MD if unable to obtain prior to antibiotics being given     Status: None (Preliminary result)   Collection Time: 10/02/15  5:39 PM  Result Value Ref Range Status   Specimen Description BLOOD RIGHT PORTA CATH  Final   Special Requests BOTTLES DRAWN AEROBIC AND ANAEROBIC 5CC  Final   Culture NO GROWTH 3 DAYS  Final   Report Status PENDING  Incomplete  Culture, sputum-assessment     Status: None   Collection Time: 10/02/15  7:29 PM  Result Value Ref Range Status   Specimen Description SPUTUM  Final   Special  Requests NONE  Final   Sputum evaluation THIS SPECIMEN IS ACCEPTABLE FOR SPUTUM CULTURE  Final   Report Status 10/02/2015 FINAL  Final  Culture, respiratory (NON-Expectorated)     Status: None   Collection Time: 10/02/15  7:29 PM  Result Value Ref Range Status   Specimen Description SPUTUM  Final   Special Requests NONE Reflexed from [id number]  Final   Gram Stain   Final    MODERATE WBC SEEN FEW GRAM NEGATIVE RODS FEW GRAM POSITIVE COCCI GOOD SPECIMEN - 80-90% WBCS    Culture LIGHT GROWTH ESCHERICHIA COLI  Final   Report Status 10/05/2015 FINAL  Final   Organism ID, Bacteria ESCHERICHIA COLI  Final      Susceptibility   Escherichia coli - MIC*    AMPICILLIN <=2 SENSITIVE Sensitive     CEFAZOLIN <=4 SENSITIVE Sensitive     CEFTRIAXONE <=1 SENSITIVE Sensitive     CIPROFLOXACIN <=0.25 SENSITIVE Sensitive     GENTAMICIN <=1 SENSITIVE Sensitive     IMIPENEM <=0.25 SENSITIVE Sensitive     NITROFURANTOIN <=16 SENSITIVE Sensitive     TRIMETH/SULFA <=20 SENSITIVE Sensitive     PIP/TAZO Value in next row Sensitive      SENSITIVE<=4    * LIGHT GROWTH  ESCHERICHIA COLI    Medical History: Past Medical History  Diagnosis Date  . Hypertension   . Pneumonia     2004  . GERD (gastroesophageal reflux disease)   . Arthritis   . Anemia     blood transfusion 2012  . Bleeding ulcer   . Machinery accident     LEG INJURY  . Cancer [facility])     mouth 2006 then again 2016   Assessment: Patient was started on TPN today with TPN scheduled to start at 1800 tonight and followed by dietary. Pharmacy consulted to monitor and supplement electrolytes as needed as well as to order SSI with q4h BG checks.   Patient has throat/tongue cancer and had last chemo/radiation ~10 days prior to admission.   Phosphorus low at 2.2 K and Mag WNL  Goal of Therapy:  Electrolytes within normal limits  Plan:  Supplement phosphorous with KPhos 20 mmol (will also provide ~27mq K to raise K to 4.3) TPN began at  1800 11/24 SSI ordered with q4h BG checks per request in consult  Mg/K/Phos ordered with AM labs tomorrow, pharmacy will continue to monitor. Thank you for the consult.  ARoe Coombs PharmD Pharmacy Resident 10/05/2015 12:10 PM

## 2015-10-05 NOTE — Progress Notes (Signed)
Nutrition Follow-up  DOCUMENTATION CODES:   Severe malnutrition in context of chronic illness  INTERVENTION:   PN: discussed TPN situation with MD Posey Pronto. TPN orders are typically for a 24-hour period and new TPN will not start until 18:00 this evening. MD ok with no IVF until new TPN hung this evening (no D10 or other IVF); MD agrees with continuing FSBS and treating low blood sugars with D50 as needed. MD ok with increasing TPN to goal rate of 80 ml/hr starting this evening, continue to assess. Noted wt trending up, monitor I/O   NUTRITION DIAGNOSIS:   Malnutrition related to chronic illness, cancer and cancer related treatments as evidenced by energy intake < or equal to 75% for > or equal to 1 month, percent weight loss, severe depletion of muscle mass, severe depletion of body fat.  GOAL:   Patient will meet greater than or equal to 90% of their needs  MONITOR:    (Energy Intake, anthropometrics, Electrolyte and renal Profile, Digestive System)  REASON FOR ASSESSMENT:   Malnutrition Screening Tool    ASSESSMENT:   Pt admitted wtih severe throat pain and pna. Pt with h/o throat cancer s/p chemotherapy and radiation, last chemotherapy 10 days PTA. Pt reports PEG fell out 2 weeks ago but has been able to take po's until last Thursday (11/17).  PEG placement pending  Diet Order:  Diet NPO time specified .TPN (CLINIMIX-E) Adult via single lumen imlpanted port  PN: 5%AA/20%Dextrose started at 40 ml/hr at 19:30 yesterday evening, mid morning today TPN bag ran out. No TPN infusing at present. Empty 1000 mL bag hanging in pt room;ordered rate was 40 ml/hr (960 mL in 24 hours), TPN should not have run out. Discussed with [facility] RN who reports that TPN was infusing at 40 ml/hr this AM, unable to confirm as TPN already off upon visit to pt room. Per I/O documentation, pt received 829 mL from 1901-700 this AM (rate of at least 70 ml/hr during night shift per documentation).   Skin:   Reviewed, no issues  Electrolyte and Renal Profile:  Recent Labs Lab 10/02/15 1140 10/04/15 1200 10/05/15 0816  BUN 26* 15 17  CREATININE 0.47* 0.38* 0.41*  NA 135 138 135  K 3.9 3.0* 4.0  MG  --  1.3* 1.8  PHOS  --  2.5 2.2*   Glucose Profile:  Recent Labs  10/05/15 0358 10/05/15 0723 10/05/15 1103  GLUCAP 266* 238* 164*   Meds: ss novolog, NS at 75 ml/hr discontinued  Height:   Ht Readings from Last 1 Encounters:  10/02/15 '6\' 2"'$  (1.88 m)    Weight:   Wt Readings from Last 1 Encounters:  10/05/15 116 lb (52.617 kg)   Filed Weights   10/02/15 1822 10/04/15 1159 10/05/15 0500  Weight: 111 lb 8 oz (50.576 kg) 112 lb 14.4 oz (51.211 kg) 116 lb (52.617 kg)    Ideal Body Weight:  86 kg  BMI:  Body mass index is 14.89 kg/(m^2).  Estimated Nutritional Needs:   Kcal:  using IBW of 86kg, BEE: 1740kcals, TEE: (IF 1.1-1.3)(AF 1.2) 2296-2714kcals  Protein:  95-111g protein (1.1-1.3g/kg)  Fluid:  2150-2563m of fluid (25-381mkg)  EDUCATION NEEDS:   No education needs identified at this time  HIMaple RapidsRD, LDN ([phone]

## 2015-10-05 NOTE — Plan of Care (Signed)
Problem: Safety: Goal: Ability to remain free from injury will improve Outcome: Progressing Encouraged to call  To assist oob. No injury this shift  Problem: Pain Managment: Goal: General experience of comfort will improve Outcome: Not Progressing Prn today of morphine  For c/o chronic / acute pain.  Pt rates 9-10/10 with improvement of from 5-8/ 10  Problem: Skin Integrity: Goal: Risk for impaired skin integrity will decrease Outcome: Progressing Bruising/ eccy.  Problem: Nutrition: Goal: Adequate nutrition will be maintained Outcome: Progressing Pt on tpn/ to start  tpn this pm at 80 ml per hour dietician following and pharmacy

## 2015-10-05 NOTE — Care Management Important Message (Signed)
Important Message  Patient Details  Name: Grant Lee MRN: [id number] Date of Birth: Aug 20, 1956   Medicare Important Message Given:  Yes    Shelbie Ammons, RN 10/05/2015, 8:45 AM

## 2015-10-06 LAB — GLUCOSE, CAPILLARY
GLUCOSE-CAPILLARY: 131 mg/dL — AB (ref 65–99)
GLUCOSE-CAPILLARY: 132 mg/dL — AB (ref 65–99)
GLUCOSE-CAPILLARY: 157 mg/dL — AB (ref 65–99)
GLUCOSE-CAPILLARY: 163 mg/dL — AB (ref 65–99)
Glucose-Capillary: 151 mg/dL — ABNORMAL HIGH (ref 65–99)
Glucose-Capillary: 117 mg/dL — ABNORMAL HIGH (ref 65–99)

## 2015-10-06 LAB — POTASSIUM: Potassium: 3.8 mmol/L (ref 3.5–5.1)

## 2015-10-06 LAB — PREALBUMIN: PREALBUMIN: 3.5 mg/dL — AB (ref 18–38)

## 2015-10-06 LAB — PHOSPHORUS: PHOSPHORUS: 2.3 mg/dL — AB (ref 2.5–4.6)

## 2015-10-06 LAB — HEMOGLOBIN A1C: HEMOGLOBIN A1C: 5.3 % (ref 4.0–6.0)

## 2015-10-06 LAB — MAGNESIUM: Magnesium: 1.5 mg/dL — ABNORMAL LOW (ref 1.7–2.4)

## 2015-10-06 MED ORDER — POTASSIUM PHOSPHATES 15 MMOLE/5ML IV SOLN
20.0000 mmol | Freq: Once | INTRAVENOUS | Status: AC
  Administered 2015-10-06: 20 mmol via INTRAVENOUS
  Filled 2015-10-06: qty 6.67

## 2015-10-06 MED ORDER — MAGNESIUM SULFATE 2 GM/50ML IV SOLN
2.0000 g | Freq: Once | INTRAVENOUS | Status: AC
  Administered 2015-10-06: 2 g via INTRAVENOUS
  Filled 2015-10-06: qty 50

## 2015-10-06 MED ORDER — MORPHINE SULFATE (PF) 2 MG/ML IV SOLN
1.0000 mg | Freq: Once | INTRAVENOUS | Status: AC
  Administered 2015-10-06: 05:00:00 1 mg via INTRAVENOUS
  Filled 2015-10-06: qty 1

## 2015-10-06 MED ORDER — POLYVINYL ALCOHOL 1.4 % OP SOLN
1.0000 [drp] | OPHTHALMIC | Status: DC | PRN
  Administered 2015-10-06: 1 [drp] via OPHTHALMIC
  Filled 2015-10-06: qty 15

## 2015-10-06 MED ORDER — TRACE MINERALS CR-CU-MN-SE-ZN 10-1000-500-60 MCG/ML IV SOLN
INTRAVENOUS | Status: AC
  Administered 2015-10-06: 19:00:00 via INTRAVENOUS
  Filled 2015-10-06: qty 1920

## 2015-10-06 NOTE — Plan of Care (Signed)
Problem: Safety: Goal: Ability to remain free from injury will improve Outcome: Progressing Remained free from injury today  Problem: Health Behavior/Discharge Planning: Goal: Ability to manage health-related needs will improve Outcome: Progressing Alert/ cooperative.   Problem: Pain Managment: Goal: General experience of comfort will improve Outcome: Progressing Pt cont to have pain  Mostly in rt hip and sometimes in throat.  Nystatin cont swish and spit. Remains npo. tpn cont at 80 ml / hr and tol well.  Via rt port.  Mag and phos. Levels down today and pt was supplemented for this. abx cont. Morphine given x2 and pt reports some relief  Problem: Physical Regulation: Goal: Will remain free from infection Outcome: Progressing abx continue  Problem: Skin Integrity: Goal: Risk for impaired skin integrity will decrease Outcome: Progressing Pink foam to elbows and bottom. Pt says this  Helps to relieve pressure.  Problem: Activity: Goal: Risk for activity intolerance will decrease Outcome: Progressing Pt  To possibly have peg placement  Next week.

## 2015-10-06 NOTE — Progress Notes (Signed)
Nutrition Follow-up  DOCUMENTATION CODES:   Severe malnutrition in context of chronic illness  INTERVENTION:  PN: Continue TPN of 5%AA/20% dextrose at goal rate of 102m/hr, pharmacy following electrolytes and blood glucose.   NUTRITION DIAGNOSIS:   Malnutrition related to chronic illness, cancer and cancer related treatments as evidenced by energy intake < or equal to 75% for > or equal to 1 month, percent weight loss, severe depletion of muscle mass, severe depletion of body fat.    GOAL:   Patient will meet greater than or equal to 90% of their needs    MONITOR:    (Energy Intake, anthropometrics, Electrolyte and renal Profile, Digestive System)  REASON FOR ASSESSMENT:   Malnutrition Screening Tool    ASSESSMENT:   Pt admitted wtih severe throat pain and pna. Pt with h/o throat cancer s/p chemotherapy and radiation, last chemotherapy 10 days PTA. Pt reports PEG fell out 2 weeks ago but has been able to take po's until last Thursday (11/17).  Planning PEG placement this week   Current Nutrition: Tolerating TPN of 5% AA/20% dextrose at goal rate of 869mhr via single lumen port a cath  Urine output: 75055mn last 24 hr Gastrointestinal Profile: Last BM: 11/25   Scheduled Medications:  . fentaNYL  50 mcg Transdermal Q72H  . insulin aspart  0-9 Units Subcutaneous 6 times per day  . levofloxacin (LEVAQUIN) IV  750 mg Intravenous Q24H  . magic mouthwash  5 mL Oral Once  . magnesium sulfate 1 - 4 g bolus IVPB  2 g Intravenous Once  . nystatin  5 mL Oral QID  . potassium phosphate IVPB (mmol)  20 mmol Intravenous Once    Continuous Medications:  . .TPMarland Kitchen (CLINIMIX-E) Adult 80 mL/hr at 10/05/15 2015     Electrolyte/Renal Profile and Glucose Profile:   Recent Labs Lab 10/02/15 1140 10/04/15 1200 10/05/15 0816 10/06/15 0728  NA 135 138 135  --   K 3.9 3.0* 4.0 3.8  CL 103 109 106  --   CO2 24 20* 24  --   BUN 26* 15 17  --   CREATININE 0.47* 0.38* 0.41*   --   CALCIUM 8.8* 8.1* 8.0*  --   MG  --  1.3* 1.8 1.5*  PHOS  --  2.5 2.2* 2.3*  GLUCOSE 80 68 227*  --    Protein Profile:  Recent Labs Lab 10/02/15 1140 10/05/15 0816  ALBUMIN 2.1* 1.7*        Weight Trend since Admission: Filed Weights   10/04/15 1159 10/05/15 0500 10/06/15 0520  Weight: 112 lb 14.4 oz (51.211 kg) 116 lb (52.617 kg) 114 lb 3.2 oz (51.801 kg)      Diet Order:  Diet NPO time specified .TPN (CLINIMIX-E) Adult  Skin:  Reviewed, no issues   Height:   Ht Readings from Last 1 Encounters:  10/02/15 '6\' 2"'$  (1.88 m)    Weight:   Wt Readings from Last 1 Encounters:  10/06/15 114 lb 3.2 oz (51.801 kg)    Ideal Body Weight:  86 kg  BMI:  Body mass index is 14.66 kg/(m^2).  Estimated Nutritional Needs:   Kcal:  using IBW of 86kg, BEE: 1740kcals, TEE: (IF 1.1-1.3)(AF 1.2) 2296-2714kcals  Protein:  95-111g protein (1.1-1.3g/kg)  Fluid:  2150-2580m23m fluid (25-30mL29m  EDUCATION NEEDS:   No education needs identified at this time  HIGH Care Level  Rehmat Murtagh B. AllenZenia Resides LRosenhayn 3[place])

## 2015-10-06 NOTE — Progress Notes (Signed)
MEDICATION RELATED CONSULT NOTE - Follow Up   Pharmacy Consult for electrolyte management with TPN Indication: TPN  No Known Allergies  Patient Measurements: Height: '6\' 2"'$  (188 cm) Weight: 114 lb 3.2 oz (51.801 kg) IBW/kg (Calculated) : 82.2  Vital Signs: Temp: 97.7 F (36.5 C) (11/26 0514) Temp Source: Oral (11/26 0514) BP: 128/70 mmHg (11/26 0504) Pulse Rate: 87 (11/26 0520) Intake/Output from previous day: 11/25 0701 - 11/26 0700 In: -  Out: 750 [Urine:750] Intake/Output from this shift: Total I/O In: -  Out: 200 [Urine:200]  Labs:  Recent Labs  10/04/15 1200 10/05/15 0816 10/06/15 0728  WBC  --  6.3  --   HGB  --  10.8*  --   HCT  --  33.3*  --   PLT  --  222  --   CREATININE 0.38* 0.41*  --   MG 1.3* 1.8 1.5*  PHOS 2.5 2.2* 2.3*  ALBUMIN  --  1.7*  --   PROT  --  5.9*  --   AST  --  20  --   ALT  --  11*  --   ALKPHOS  --  63  --   BILITOT  --  0.3  --    Estimated Creatinine Clearance: 72.8 mL/min (by C-G formula based on Cr of 0.41).   Microbiology: Recent Results (from the past 720 hour(s))  Culture, blood (routine x 2) Call MD if unable to obtain prior to antibiotics being given     Status: None (Preliminary result)   Collection Time: 10/02/15  5:34 PM  Result Value Ref Range Status   Specimen Description BLOOD LEFT ASSIST CONTROL  Final   Special Requests BOTTLES DRAWN AEROBIC AND ANAEROBIC 5CC  Final   Culture NO GROWTH 4 DAYS  Final   Report Status PENDING  Incomplete  Culture, blood (routine x 2) Call MD if unable to obtain prior to antibiotics being given     Status: None (Preliminary result)   Collection Time: 10/02/15  5:39 PM  Result Value Ref Range Status   Specimen Description BLOOD RIGHT PORTA CATH  Final   Special Requests BOTTLES DRAWN AEROBIC AND ANAEROBIC 5CC  Final   Culture NO GROWTH 4 DAYS  Final   Report Status PENDING  Incomplete  Culture, sputum-assessment     Status: None   Collection Time: 10/02/15  7:29 PM  Result  Value Ref Range Status   Specimen Description SPUTUM  Final   Special Requests NONE  Final   Sputum evaluation THIS SPECIMEN IS ACCEPTABLE FOR SPUTUM CULTURE  Final   Report Status 10/02/2015 FINAL  Final  Culture, respiratory (NON-Expectorated)     Status: None   Collection Time: 10/02/15  7:29 PM  Result Value Ref Range Status   Specimen Description SPUTUM  Final   Special Requests NONE Reflexed from [id number]  Final   Gram Stain   Final    MODERATE WBC SEEN FEW GRAM NEGATIVE RODS FEW GRAM POSITIVE COCCI GOOD SPECIMEN - 80-90% WBCS    Culture LIGHT GROWTH ESCHERICHIA COLI  Final   Report Status 10/05/2015 FINAL  Final   Organism ID, Bacteria ESCHERICHIA COLI  Final      Susceptibility   Escherichia coli - MIC*    AMPICILLIN <=2 SENSITIVE Sensitive     CEFAZOLIN <=4 SENSITIVE Sensitive     CEFTRIAXONE <=1 SENSITIVE Sensitive     CIPROFLOXACIN <=0.25 SENSITIVE Sensitive     GENTAMICIN <=1 SENSITIVE Sensitive     IMIPENEM <=  0.25 SENSITIVE Sensitive     NITROFURANTOIN <=16 SENSITIVE Sensitive     TRIMETH/SULFA <=20 SENSITIVE Sensitive     PIP/TAZO Value in next row Sensitive      SENSITIVE<=4    * LIGHT GROWTH ESCHERICHIA COLI    Medical History: Past Medical History  Diagnosis Date  . Hypertension   . Pneumonia     2004  . GERD (gastroesophageal reflux disease)   . Arthritis   . Anemia     blood transfusion 2012  . Bleeding ulcer   . Machinery accident     LEG INJURY  . Cancer [facility])     mouth 2006 then again 2016   Assessment: Patient was started on TPN today with TPN scheduled to start at 1800 tonight and followed by dietary. Pharmacy consulted to monitor and supplement electrolytes as needed as well as to order SSI with q4h BG checks.   Patient has throat/tongue cancer and had last chemo/radiation ~10 days prior to admission.   Magnesium low at 1.5; Phosphorus low at 2.3 Potassium WNL (3.8)  Goal of Therapy:  Electrolytes within normal limits  Plan:   Supplement with Mag 2g IV and KPhos 20 mmol IV.  TPN began at 1800 11/24. SSI ordered with q4h BG checks per request in consult  Mg/K/Phos ordered with AM labs tomorrow.   Pharmacy will continue to monitor.   Olivia Canter [facility] Clinical Pharmacist 10/06/2015

## 2015-10-06 NOTE — Progress Notes (Signed)
Patient ID: KHRYSTIAN SCHAUF, male   DOB: 1956-02-04, 59 y.o.   MRN: [id number] [place] [place] NAME: Jhair Witherington    MR#:  [id number]  DATE OF BIRTH:  1956-08-03  SUBJECTIVE:  Does not like blood draws. No fever  REVIEW OF SYSTEMS:   Review of Systems  Constitutional: Negative for fever, chills and weight loss.  HENT: Positive for sore throat. Negative for ear discharge, ear pain and nosebleeds.   Eyes: Negative for blurred vision, pain and discharge.  Respiratory: Negative for sputum production, shortness of breath, wheezing and stridor.   Cardiovascular: Negative for chest pain, palpitations, orthopnea and PND.  Gastrointestinal: Negative for nausea, vomiting, abdominal pain and diarrhea.  Genitourinary: Negative for urgency and frequency.  Musculoskeletal: Negative for back pain and joint pain.  Neurological: Positive for weakness. Negative for sensory change, speech change and focal weakness.  Psychiatric/Behavioral: Negative for depression and hallucinations. The patient is not nervous/anxious.   All other systems reviewed and are negative.  Tolerating Diet:npo  DRUG ALLERGIES:  No Known Allergies  VITALS:  Blood pressure 128/70, pulse 87, temperature 97.7 F (36.5 C), temperature source Oral, resp. rate 16, height '6\' 2"'$  (1.88 m), weight 114 lb 3.2 oz (51.801 kg), SpO2 90 %.  PHYSICAL EXAMINATION:   Physical Exam  GENERAL:  59 y.o.-year-old patient lying in the bed with no acute distress. Thin, cachectic EYES: Pupils equal, round, reactive to light and accommodation. No scleral icterus. Extraocular muscles intact.  HEENT: Head atraumatic, normocephalic. Oropharynx and nasopharynx clear.  NECK:  Supple, no jugular venous distention. No thyroid enlargement, no tenderness.  LUNGS: distant breath sounds bilaterally, no wheezing, rales, rhonchi. No use of accessory muscles of respiration. Coarse bs on right.  Port + CARDIOVASCULAR: S1, S2 normal. No murmurs, rubs, or gallops.  ABDOMEN: Soft, nontender, nondistended. Bowel sounds present. No organomegaly or mass.  EXTREMITIES: No cyanosis, clubbing or edema b/l.    NEUROLOGIC: Cranial nerves II through XII are intact. No focal Motor or sensory deficits b/l.   PSYCHIATRIC: The patient is alert and oriented x 3.  SKIN: No obvious rash, lesion, or ulcer.    LABORATORY PANEL:   CBC  Recent Labs Lab 10/05/15 0816  WBC 6.3  HGB 10.8*  HCT 33.3*  PLT 222    Chemistries   Recent Labs Lab 10/05/15 0816  NA 135  K 4.0  CL 106  CO2 24  GLUCOSE 227*  BUN 17  CREATININE 0.41*  CALCIUM 8.0*  MG 1.8  AST 20  ALT 11*  ALKPHOS 63  BILITOT 0.3    Cardiac Enzymes  Recent Labs Lab 10/02/15 1140  TROPONINI 0.10*    RADIOLOGY:  No results found.   ASSESSMENT AND PLAN:   60 y.o. 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  1. Multifocal pneumonia likely aspiration pneumonia given issues with swallowing IV Levaquin qd Blood cultures negative  Breathing treatment as needed Remains afebrile  2. Odynophagia Status post radiation therapy completed 10 days ago for throat/tongue cancer chlorSeptic spray,Nystatin swish and swallow Speech therapy evaluation noted. Recommends to stay NPO since pt is aspirating all kinds of food. Pt will need PEG tube placed  next week.  3. Cachexia and malnutrition Dietitian for TPN started on 10/04/2015  PEG tube to be placed next week.  4.GERD continue Protonix  5. Lovenox for DVT prophylaxis  Case discussed with Care Management/Social Worker. Management plans discussed  with the patient, family and they are in agreement.  CODE STATUS: full  DVT Prophylaxis: lovenox  TOTAL TIME TAKING CARE OF THIS PATIENT: 30 minutes.  >50% time spent on counselling and coordination of care pt, family, ST  POSSIBLE D/C IN few DAYS, DEPENDING ON CLINICAL CONDITION.   Roann Merk M.D on 10/06/2015 at 7:25 AM  Between 7am to 6pm - Pager - [phone]  After 6pm go to [email or web address] - password [email or web address] [place]  Office  [phone]  CC: Primary care physician; Cyndi Bender, [name]

## 2015-10-06 NOTE — Progress Notes (Signed)
Patient ID: Grant Lee, male   DOB: 05-01-1956, 59 y.o.   MRN: [id number] [place] [facility] [place] NAME: Grant Lee    MR#:  [id number]  DATE OF BIRTH:  03-Oct-1956  SUBJECTIVE:  Throat pain. Coughing up brown phlegm started on TPN   REVIEW OF SYSTEMS:   ROS Tolerating Diet:npo  DRUG ALLERGIES:  No Known Allergies  VITALS:  Blood pressure 128/70, pulse 87, temperature 97.7 F (36.5 C), temperature source Oral, resp. rate 16, height '6\' 2"'$  (1.88 m), weight 114 lb 3.2 oz (51.801 kg), SpO2 90 %.  PHYSICAL EXAMINATION:   Physical Exam  GENERAL:  59 y.o.-year-old patient lying in the bed with no acute distress. Thin, cachectic EYES: Pupils equal, round, reactive to light and accommodation. No scleral icterus. Extraocular muscles intact.  HEENT: Head atraumatic, normocephalic. Oropharynx and nasopharynx clear.  NECK:  Supple, no jugular venous distention. No thyroid enlargement, no tenderness.  LUNGS: distant breath sounds bilaterally, no wheezing, rales, rhonchi. No use of accessory muscles of respiration. Coarse bs on right CARDIOVASCULAR: S1, S2 normal. No murmurs, rubs, or gallops.  ABDOMEN: Soft, nontender, nondistended. Bowel sounds present. No organomegaly or mass.  EXTREMITIES: No cyanosis, clubbing or edema b/l.    NEUROLOGIC: Cranial nerves II through XII are intact. No focal Motor or sensory deficits b/l.   PSYCHIATRIC: The patient is alert and oriented x 3.  SKIN: No obvious rash, lesion, or ulcer.    LABORATORY PANEL:   CBC  Recent Labs Lab 10/05/15 0816  WBC 6.3  HGB 10.8*  HCT 33.3*  PLT 222    Chemistries   Recent Labs Lab 10/05/15 0816  NA 135  K 4.0  CL 106  CO2 24  GLUCOSE 227*  BUN 17  CREATININE 0.41*  CALCIUM 8.0*  MG 1.8  AST 20  ALT 11*  ALKPHOS 63  BILITOT 0.3    Cardiac Enzymes  Recent Labs Lab 10/02/15 1140  TROPONINI 0.10*    RADIOLOGY:  No results  found.   ASSESSMENT AND PLAN:   59 y.o. 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  1. Multifocal pneumonia ?aspiration IV Levaquin qd Blood cultures negative  Breathing treatment as needed Remains afebrile  2. Odynophagia Status post radiation therapy completed 10 days ago for throat/tongue cancer chlorSeptic spray,Nystatin swish and swallow Speech therapy evaluation noted. Recommends to stay NPO since pt is aspirating all kinds of food. Pt will need PEG tube placed  3. Cachexia and malnutrition Dietitian to see patient-for TPN started on 10/04/2015  PEG tube to be placed next week.  4.GERD continue Protonix  5. Lovenox for DVT prophylaxis   Case discussed with Care Management/Social Worker. Management plans discussed with the patient, family and they are in agreement.  CODE STATUS: full  DVT Prophylaxis: lovenox  TOTAL TIME TAKING CARE OF THIS PATIENT: 30 minutes.  >50% time spent on counselling and coordination of care pt, family, ST  POSSIBLE D/C IN few DAYS, DEPENDING ON CLINICAL CONDITION.   Grant Lee M.D on 10/06/2015 at 7:24 AM  Between 7am to 6pm - Pager - [phone]  After 6pm go to [email or web address] - password [email or web address] [facility]  Office  [phone]  CC: Primary care physician; Grant Lee, Grant Lee

## 2015-10-07 LAB — CULTURE, BLOOD (ROUTINE X 2)
CULTURE: NO GROWTH
Culture: NO GROWTH

## 2015-10-07 LAB — GLUCOSE, CAPILLARY
GLUCOSE-CAPILLARY: 187 mg/dL — AB (ref 65–99)
GLUCOSE-CAPILLARY: 65 mg/dL (ref 65–99)
GLUCOSE-CAPILLARY: 97 mg/dL (ref 65–99)
Glucose-Capillary: 102 mg/dL — ABNORMAL HIGH (ref 65–99)
Glucose-Capillary: 108 mg/dL — ABNORMAL HIGH (ref 65–99)
Glucose-Capillary: 111 mg/dL — ABNORMAL HIGH (ref 65–99)
Glucose-Capillary: 112 mg/dL — ABNORMAL HIGH (ref 65–99)
Glucose-Capillary: 129 mg/dL — ABNORMAL HIGH (ref 65–99)

## 2015-10-07 LAB — POTASSIUM: Potassium: 4 mmol/L (ref 3.5–5.1)

## 2015-10-07 LAB — PHOSPHORUS: Phosphorus: 3 mg/dL (ref 2.5–4.6)

## 2015-10-07 LAB — MAGNESIUM: MAGNESIUM: 1.7 mg/dL (ref 1.7–2.4)

## 2015-10-07 MED ORDER — MAGNESIUM SULFATE 2 GM/50ML IV SOLN
2.0000 g | Freq: Once | INTRAVENOUS | Status: AC
  Administered 2015-10-07: 10:00:00 2 g via INTRAVENOUS
  Filled 2015-10-07: qty 50

## 2015-10-07 MED ORDER — TRACE MINERALS CR-CU-MN-SE-ZN 10-1000-500-60 MCG/ML IV SOLN
INTRAVENOUS | Status: AC
  Administered 2015-10-07: 17:00:00 via INTRAVENOUS
  Filled 2015-10-07: qty 1920

## 2015-10-07 MED ORDER — DEXTROSE 50 % IV SOLN
25.0000 mL | Freq: Once | INTRAVENOUS | Status: AC
  Administered 2015-10-07: 25 mL via INTRAVENOUS
  Filled 2015-10-07: qty 50

## 2015-10-07 NOTE — Plan of Care (Signed)
Problem: Spiritual Needs Goal: Ability to function at adequate level Outcome: Progressing Plan of care, progress to goals. 1. VSS, blood sugars within acceptable ranges. 2. Tolerating NPO, TPN. 3. Skin protection to sacrum and elbows with foam dressing . 4. Pt declining prn oral meds as he said it makes pain worse. 5. Complains of dry eyes and order for eye drops received and eye drops to bedside. Dorna Bloom RN

## 2015-10-07 NOTE — Progress Notes (Signed)
Patient ID: Grant Lee, male   DOB: [birth year], 59 y.o.   MRN: [id number] Grant Lee at [place] NAME: Grant Lee    MR#:  [id number]  DATE OF BIRTH:  1956-08-25  SUBJECTIVE:  Does not like blood draws. No fever Throat been much better  REVIEW OF SYSTEMS:   Review of Systems  Constitutional: Negative for fever, chills and weight loss.  HENT: Positive for sore throat. Negative for ear discharge, ear pain and nosebleeds.   Eyes: Negative for blurred vision, pain and discharge.  Respiratory: Negative for sputum production, shortness of breath, wheezing and stridor.   Cardiovascular: Negative for chest pain, palpitations, orthopnea and PND.  Gastrointestinal: Negative for nausea, vomiting, abdominal pain and diarrhea.  Genitourinary: Negative for urgency and frequency.  Musculoskeletal: Negative for back pain and joint pain.  Neurological: Positive for weakness. Negative for sensory change, speech change and focal weakness.  Psychiatric/Behavioral: Negative for depression and hallucinations. The patient is not nervous/anxious.   All other systems reviewed and are negative.  Tolerating Diet:npo  DRUG ALLERGIES:  No Known Allergies  VITALS:  Blood pressure 95/66, pulse 100, temperature 97.8 F (36.6 C), temperature source Oral, resp. rate 18, height '6\' 2"'$  (1.88 m), weight 111 lb (50.349 kg), SpO2 92 %.  PHYSICAL EXAMINATION:   Physical Exam  GENERAL:  59 y.o.-year-old patient lying in the bed with no acute distress. Thin, cachectic EYES: Pupils equal, round, reactive to light and accommodation. No scleral icterus. Extraocular muscles intact.  HEENT: Head atraumatic, normocephalic. Oropharynx and nasopharynx clear.  NECK:  Supple, no jugular venous distention. No thyroid enlargement, no tenderness.  LUNGS: distant breath sounds bilaterally, no wheezing, rales, rhonchi. No use of accessory muscles of respiration.  Coarse bs on right. Port + CARDIOVASCULAR: S1, S2 normal. No murmurs, rubs, or gallops.  ABDOMEN: Soft, nontender, nondistended. Bowel sounds present. No organomegaly or mass.  EXTREMITIES: No cyanosis, clubbing or edema b/l.    NEUROLOGIC: Cranial nerves II through XII are intact. No focal Motor or sensory deficits b/l.   PSYCHIATRIC: The patient is alert and oriented x 3.  SKIN: No obvious rash, lesion, or ulcer.    LABORATORY PANEL:   CBC  Recent Labs Lab 10/05/15 0816  WBC 6.3  HGB 10.8*  HCT 33.3*  PLT 222    Chemistries   Recent Labs Lab 10/05/15 0816  10/07/15 0745  NA 135  --   --   K 4.0  < > 4.0  CL 106  --   --   CO2 24  --   --   GLUCOSE 227*  --   --   BUN 17  --   --   CREATININE 0.41*  --   --   CALCIUM 8.0*  --   --   MG 1.8  < > 1.7  AST 20  --   --   ALT 11*  --   --   ALKPHOS 63  --   --   BILITOT 0.3  --   --   < > = values in this interval not displayed.  Cardiac Enzymes  Recent Labs Lab 10/02/15 1140  TROPONINI 0.10*    RADIOLOGY:  No results found.   ASSESSMENT AND PLAN:   59 y.o. 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  1. Multifocal pneumonia likely aspiration pneumonia given issues with swallowing IV Levaquin qd Blood cultures negative  Breathing treatment as needed Remains afebrile  2. Odynophagia Status post radiation therapy completed 10 days ago for throat/tongue cancer chlorSeptic spray,Nystatin swish and swallow Speech therapy evaluation noted. Recommends to stay NPO since pt is aspirating all kinds of food. Pt will need PEG tube placed next week.  3. Cachexia and malnutrition Dietitian  for TPN started on 10/04/2015  PEG tube to be placed next week.  4.GERD continue Protonix  5. Lovenox for DVT prophylaxis  Case discussed with Care Management/Social Worker. Management plans discussed with the patient, family and they are in agreement.  CODE STATUS: full  DVT Prophylaxis: lovenox  TOTAL TIME TAKING CARE OF THIS PATIENT: 30 minutes.  >50% time spent on counselling and coordination of care pt, family, ST  POSSIBLE D/C IN few DAYS, DEPENDING ON CLINICAL CONDITION. And placement of  PEG tube.   [name] M.D on 10/07/2015 at 2:01 PM  Between 7am to 6pm - Pager - [phone]  After 6pm go to [email or web address] - password [email or web address] [place]  Office  [phone]  CC: Primary care physician; Cyndi Bender, [name]

## 2015-10-07 NOTE — Plan of Care (Signed)
Problem: Spiritual Needs Goal: Ability to function at adequate level Outcome: Progressing Plan of care progress: -continues TPN, monitor labs -PEG tube to be placed possibly on Monday -PRN pain meds given with relief -remains fall/injury free, call bell in reach, bed alarm on, encouraged to call out for assist -foam dressings in place to protect bony areas -pt voices any needs

## 2015-10-07 NOTE — Progress Notes (Signed)
MEDICATION RELATED CONSULT NOTE - Follow Up   Pharmacy Consult for electrolyte management with TPN Indication: TPN  No Known Allergies  Patient Measurements: Height: '6\' 2"'$  (188 cm) Weight: 111 lb (50.349 kg) IBW/kg (Calculated) : 82.2  Vital Signs: Temp: 98.6 F (37 C) (11/27 0633) Temp Source: Oral (11/27 5361) BP: 96/61 mmHg (11/27 4431) Pulse Rate: 100 (11/27 0711) Intake/Output from previous day: 11/26 0701 - 11/27 0700 In: 2639 [IV Piggyback:143; VQM:0867] Out: 6195 [Urine:1550] Intake/Output from this shift: Total I/O In: -  Out: 225 [Urine:225]  Labs:  Recent Labs  10/04/15 1200 10/05/15 0816 10/06/15 0728 10/07/15 0745  WBC  --  6.3  --   --   HGB  --  10.8*  --   --   HCT  --  33.3*  --   --   PLT  --  222  --   --   CREATININE 0.38* 0.41*  --   --   MG 1.3* 1.8 1.5* 1.7  PHOS 2.5 2.2* 2.3* 3.0  ALBUMIN  --  1.7*  --   --   PROT  --  5.9*  --   --   AST  --  20  --   --   ALT  --  11*  --   --   ALKPHOS  --  63  --   --   BILITOT  --  0.3  --   --    Estimated Creatinine Clearance: 70.7 mL/min (by C-G formula based on Cr of 0.41).   Microbiology: Recent Results (from the past 720 hour(s))  Culture, blood (routine x 2) Call MD if unable to obtain prior to antibiotics being given     Status: None (Preliminary result)   Collection Time: 10/02/15  5:34 PM  Result Value Ref Range Status   Specimen Description BLOOD LEFT ASSIST CONTROL  Final   Special Requests BOTTLES DRAWN AEROBIC AND ANAEROBIC 5CC  Final   Culture NO GROWTH 4 DAYS  Final   Report Status PENDING  Incomplete  Culture, blood (routine x 2) Call MD if unable to obtain prior to antibiotics being given     Status: None (Preliminary result)   Collection Time: 10/02/15  5:39 PM  Result Value Ref Range Status   Specimen Description BLOOD RIGHT PORTA CATH  Final   Special Requests BOTTLES DRAWN AEROBIC AND ANAEROBIC 5CC  Final   Culture NO GROWTH 4 DAYS  Final   Report Status PENDING   Incomplete  Culture, sputum-assessment     Status: None   Collection Time: 10/02/15  7:29 PM  Result Value Ref Range Status   Specimen Description SPUTUM  Final   Special Requests NONE  Final   Sputum evaluation THIS SPECIMEN IS ACCEPTABLE FOR SPUTUM CULTURE  Final   Report Status 10/02/2015 FINAL  Final  Culture, respiratory (NON-Expectorated)     Status: None   Collection Time: 10/02/15  7:29 PM  Result Value Ref Range Status   Specimen Description SPUTUM  Final   Special Requests NONE Reflexed from [id number]  Final   Gram Stain   Final    MODERATE WBC SEEN FEW GRAM NEGATIVE RODS FEW GRAM POSITIVE COCCI GOOD SPECIMEN - 80-90% WBCS    Culture LIGHT GROWTH ESCHERICHIA COLI  Final   Report Status 10/05/2015 FINAL  Final   Organism ID, Bacteria ESCHERICHIA COLI  Final      Susceptibility   Escherichia coli - MIC*    AMPICILLIN <=2 SENSITIVE Sensitive  CEFAZOLIN <=4 SENSITIVE Sensitive     CEFTRIAXONE <=1 SENSITIVE Sensitive     CIPROFLOXACIN <=0.25 SENSITIVE Sensitive     GENTAMICIN <=1 SENSITIVE Sensitive     IMIPENEM <=0.25 SENSITIVE Sensitive     NITROFURANTOIN <=16 SENSITIVE Sensitive     TRIMETH/SULFA <=20 SENSITIVE Sensitive     PIP/TAZO Value in next row Sensitive      SENSITIVE<=4    * LIGHT GROWTH ESCHERICHIA COLI    Medical History: Past Medical History  Diagnosis Date  . Hypertension   . Pneumonia     2004  . GERD (gastroesophageal reflux disease)   . Arthritis   . Anemia     blood transfusion 2012  . Bleeding ulcer   . Machinery accident     LEG INJURY  . Cancer [facility])     mouth 2006 then again 2016   Assessment: Patient was started on TPN today with TPN scheduled to start at 1800 tonight and followed by dietary. Pharmacy consulted to monitor and supplement electrolytes as needed as well as to order SSI with q4h BG checks.   Patient has throat/tongue cancer and had last chemo/radiation ~10 days prior to admission.   Magnesium low at 1.7.   Potassium  and Phosphorus WNL.  Goal of Therapy:  Electrolytes within normal limits  Plan:  Supplement with Mag 2g IV.   TPN began at 1800 11/24. SSI ordered with q4h BG checks per request in consult  Mg/K/Phos ordered with AM labs tomorrow.   Pharmacy will continue to monitor.   Olivia Canter [facility] Clinical Pharmacist 10/07/2015

## 2015-10-08 ENCOUNTER — Encounter: Payer: Self-pay | Admitting: *Deleted

## 2015-10-08 ENCOUNTER — Encounter
Admission: EM | Disposition: A | Discharge status: Skilled Nursing Facility | Payer: Self-pay | Source: Home / Self Care | Attending: Internal Medicine

## 2015-10-08 ENCOUNTER — Inpatient Hospital Stay: Payer: Medicare Other | Admitting: Anesthesiology

## 2015-10-08 HISTORY — PX: PEG PLACEMENT: SHX5437

## 2015-10-08 LAB — PHOSPHORUS: Phosphorus: 3.3 mg/dL (ref 2.5–4.6)

## 2015-10-08 LAB — GLUCOSE, CAPILLARY
GLUCOSE-CAPILLARY: 119 mg/dL — AB (ref 65–99)
GLUCOSE-CAPILLARY: 122 mg/dL — AB (ref 65–99)
Glucose-Capillary: 114 mg/dL — ABNORMAL HIGH (ref 65–99)
Glucose-Capillary: 117 mg/dL — ABNORMAL HIGH (ref 65–99)
Glucose-Capillary: 89 mg/dL (ref 65–99)

## 2015-10-08 LAB — POTASSIUM: Potassium: 4.4 mmol/L (ref 3.5–5.1)

## 2015-10-08 LAB — MAGNESIUM: MAGNESIUM: 2 mg/dL (ref 1.7–2.4)

## 2015-10-08 SURGERY — INSERTION, PEG TUBE
Anesthesia: General

## 2015-10-08 MED ORDER — PROPOFOL 500 MG/50ML IV EMUL
INTRAVENOUS | Status: DC | PRN
  Administered 2015-10-08: 60 ug/kg/min via INTRAVENOUS

## 2015-10-08 MED ORDER — TRACE MINERALS CR-CU-MN-SE-ZN 10-1000-500-60 MCG/ML IV SOLN
INTRAVENOUS | Status: AC
  Administered 2015-10-08: 18:00:00 via INTRAVENOUS
  Filled 2015-10-08: qty 1920

## 2015-10-08 MED ORDER — ONDANSETRON HCL 4 MG/2ML IJ SOLN: 4.0000 mg | Freq: Once | INTRAMUSCULAR | Status: DC | PRN

## 2015-10-08 MED ORDER — CEFAZOLIN SODIUM 1-5 GM-% IV SOLN
INTRAVENOUS | Status: AC
  Administered 2015-10-08: 1000 mg
  Filled 2015-10-08: qty 50

## 2015-10-08 MED ORDER — MORPHINE SULFATE (PF) 2 MG/ML IV SOLN
2.0000 mg | INTRAVENOUS | Status: DC | PRN
  Administered 2015-10-08 – 2015-10-09 (×3): 2 mg via INTRAVENOUS
  Filled 2015-10-08 (×3): qty 1

## 2015-10-08 MED ORDER — PROPOFOL 10 MG/ML IV BOLUS
INTRAVENOUS | Status: DC | PRN
  Administered 2015-10-08: 40 mg via INTRAVENOUS
  Administered 2015-10-08: 20 mg via INTRAVENOUS

## 2015-10-08 MED ORDER — FENTANYL CITRATE (PF) 100 MCG/2ML IJ SOLN
25.0000 ug | INTRAMUSCULAR | Status: DC | PRN
  Filled 2015-10-08: qty 0.5

## 2015-10-08 MED ORDER — LEVOFLOXACIN 25 MG/ML PO SOLN
750.0000 mg | Freq: Every day | ORAL | Status: DC
  Administered 2015-10-09 – 2015-10-10 (×2): 750 mg
  Filled 2015-10-08 (×2): qty 30

## 2015-10-08 MED ORDER — LEVOFLOXACIN IN D5W 750 MG/150ML IV SOLN
750.0000 mg | Freq: Once | INTRAVENOUS | Status: AC
  Administered 2015-10-08: 750 mg via INTRAVENOUS
  Filled 2015-10-08: qty 150

## 2015-10-08 MED ORDER — LACTATED RINGERS IV SOLN
INTRAVENOUS | Status: DC | PRN
  Administered 2015-10-08: 16:00:00 via INTRAVENOUS

## 2015-10-08 MED ORDER — CEFAZOLIN SODIUM 1-5 GM-% IV SOLN
INTRAVENOUS | Status: DC | PRN
  Administered 2015-10-08: 1 g via INTRAVENOUS

## 2015-10-08 NOTE — Op Note (Signed)
[facility] Gastroenterology Patient Name: Grant Lee Procedure Date: 10/08/2015 3:55 PM MRN: [id number] Account #: [id number] Date of Birth: 06-02-1956 Admit Type: Inpatient Age: 59 Room: [facility] ENDO ROOM 1 Gender: Male Note Status: Finalized Procedure:         Upper GI endoscopy Indications:       Dysphagia, Malnutrition, Hx of head and neck cancer. Providers:         Lupita Dawn. Candace Cruise, MD Referring MD:      Forest Gleason Md, MD (Referring MD) Medicines:         Monitored Anesthesia Care Complications:     No immediate complications. Procedure:         Pre-Anesthesia Assessment:                    - Prior to the procedure, a History and Physical was                     performed, and patient medications, allergies and                     sensitivities were reviewed. The patient's tolerance of                     previous anesthesia was reviewed.                    - The risks and benefits of the procedure and the sedation                     options and risks were discussed with the patient. All                     questions were answered and informed consent was obtained.                    - After reviewing the risks and benefits, the patient was                     deemed in satisfactory condition to undergo the procedure.                    After obtaining informed consent, the endoscope was passed                     under direct vision. Throughout the procedure, the                     patient's blood pressure, pulse, and oxygen saturations                     were monitored continuously. The Endoscope was introduced                     through the mouth, and advanced to the second part of                     duodenum. The upper GI endoscopy was accomplished without                     difficulty. The patient tolerated the procedure well. Findings:      LA Grade A (one or more mucosal breaks less than 5 mm, not extending       between tops of 2 mucosal folds)  esophagitis was  found in the lower       third of the esophagus.      The exam was otherwise without abnormality.      Diffuse mild inflammation characterized by erythema was found in the       gastric body. The patient was placed in the supine position for PEG       placement. The stomach was insufflated to appose gastric and abdominal       walls. A site was located in the body of the stomach with excellent       manual external pressure for placement. Was placed at previous G site.       The abdominal wall was marked and prepped in a sterile manner. The area       was anesthetized with 5 mL of 1% lidocaine. The trocar needle was       introduced through the abdominal wall and into the stomach under direct       endoscopic view. A snare was introduced through the endoscope and opened       in the gastric lumen. The guide wire was passed through the trocar and       into the open snare. The snare was closed around the guide wire. The       endoscope and snare were removed, pulling the wire out through the       mouth. A skin incision was made at the site of needle insertion. The       endoscopically removable 20 Fr Bard gastrostomy tube was lubricated. The       G-tube was tied to the guide wire and pulled through the mouth and into       the stomach. The trocar needle was removed, and the gastrostomy tube was       pulled out from the stomach through the skin. The external bumper was       attached to the gastrostomy tube, and the tube was cut to remove the       guide wire. The final position of the gastrostomy tube was confirmed by       relook endoscopy, and skin marking noted to be 1.5 cm at the external       bumper. The final tension and compression of the abdominal wall by the       PEG tube and external bumper were checked and revealed that the bumper       was moderately tight and mildly deforming the skin. The feeding tube was       capped, and the tube site cleaned and  dressed.      The exam was otherwise without abnormality.      The examined duodenum was normal. Impression:        - LA Grade A reflux esophagitis.                    - The examination was otherwise normal.                    - Chronic gastritis.                    - The examination was otherwise normal.                    - Normal examined duodenum.                    -  An endoscopically removable PEG placement was                     successfully completed.                    - No specimens collected. Recommendation:    - Observe patient's clinical course.                    - The findings and recommendations were discussed with the                     patient. Procedure Code(s): --- Professional ---                    [phone], Esophagogastroduodenoscopy, flexible, transoral;                     with directed placement of percutaneous gastrostomy tube Diagnosis Code(s): --- Professional ---                    K21.0, Gastro-esophageal reflux disease with esophagitis                    K29.50, Unspecified chronic gastritis without bleeding                    R13.10, Dysphagia, unspecified                    E46, Unspecified protein-calorie malnutrition CPT copyright 2014 American Medical Association. All rights reserved. The codes documented in this report are preliminary and upon coder review may  be revised to meet current compliance requirements. Hulen Luster, MD 10/08/2015 4:26:28 PM This report has been signed electronically. Number of Addenda: 0 Note Initiated On: 10/08/2015 3:55 PM      [facility]

## 2015-10-08 NOTE — Plan of Care (Signed)
Problem: Safety: Goal: Ability to remain free from injury will improve Outcome: Progressing No injury this shift  Problem: Health Behavior/Discharge Planning: Goal: Ability to manage health-related needs will improve Outcome: Progressing Compliant with treatment plan  Problem: Pain Managment: Goal: General experience of comfort will improve Outcome: Progressing Pain med given q 4 hours with improvement  Problem: Physical Regulation: Goal: Ability to maintain clinical measurements within normal limits will improve Outcome: Progressing VSS Goal: Will remain free from infection Outcome: Progressing No signs of infection  Problem: Skin Integrity: Goal: Risk for impaired skin integrity will decrease Outcome: Not Progressing Foam dressing intake for protection  Problem: Activity: Goal: Risk for activity intolerance will decrease Outcome: Not Progressing Generalized weakness  Problem: Nutrition: Goal: Adequate nutrition will be maintained Outcome: Not Progressing For PEG tube placement today-TPN infusing  Problem: Bowel/Gastric: Goal: Will not experience complications related to bowel motility Outcome: Progressing WNL for patient

## 2015-10-08 NOTE — Care Management Important Message (Signed)
Important Message  Patient Details  Name: Grant Lee MRN: [id number] Date of Birth: Dec 10, 1955   Medicare Important Message Given:  Yes    [name] A Koriana Stepien 10/08/2015, 11:15 AM

## 2015-10-08 NOTE — Clinical Social Work Note (Signed)
CSW spoke with MD and RNCM regarding pt's discharge plan. Pt is getting his PEG placed today. Per MD, pt will likely need HHPT. RNCM is aware and following. CSW will sign off as no further needs identified. Please reconsult if a need arises prior to discharge.   Darden Dates, MSW, LCSW Clinical Social Worker  [phone]

## 2015-10-08 NOTE — Plan of Care (Signed)
Pt had PEG tube placed today.  Will be able to use tomorrow.  Need to pass on to dietician that he can't tolerate Jevity. Pt calls for assistance and pain medicine which he requires q4. PT unable to work w/him today b/c of procedure - will work with him tomorrow.

## 2015-10-08 NOTE — Anesthesia Procedure Notes (Signed)
Date/Time: 10/08/2015 4:00 PM Performed by: Nelda Marseille Pre-anesthesia Checklist: Patient identified, Emergency Drugs available, Suction available, Patient being monitored and Timeout performed Oxygen Delivery Method: Nasal cannula

## 2015-10-08 NOTE — Progress Notes (Signed)
Patient ID: Grant Lee, male   DOB: [birth year], 59 y.o.   MRN: [id number] [place] [facility] [place] NAME: Grant Lee    MR#:  [id number]  DATE OF BIRTH:  1956/07/20  SUBJECTIVE:   Throat been much better. gen weakness  REVIEW OF SYSTEMS:   Review of Systems  Constitutional: Negative for fever, chills and weight loss.  HENT: Positive for sore throat. Negative for ear discharge, ear pain and nosebleeds.   Eyes: Negative for blurred vision, pain and discharge.  Respiratory: Negative for sputum production, shortness of breath, wheezing and stridor.   Cardiovascular: Negative for chest pain, palpitations, orthopnea and PND.  Gastrointestinal: Negative for nausea, vomiting, abdominal pain and diarrhea.  Genitourinary: Negative for urgency and frequency.  Musculoskeletal: Negative for back pain and joint pain.  Neurological: Positive for weakness. Negative for sensory change, speech change and focal weakness.  Psychiatric/Behavioral: Negative for depression and hallucinations. The patient is not nervous/anxious.   All other systems reviewed and are negative.  Tolerating Diet:npo  DRUG ALLERGIES:  No Known Allergies  VITALS:  Blood pressure 102/60, pulse 84, temperature 98 F (36.7 C), temperature source Oral, resp. rate 19, height '6\' 2"'$  (1.88 m), weight 108 lb (48.988 kg), SpO2 92 %.  PHYSICAL EXAMINATION:   Physical Exam  GENERAL:  59 y.o.-year-old patient lying in the bed with no acute distress. Thin, cachectic EYES: Pupils equal, round, reactive to light and accommodation. No scleral icterus. Extraocular muscles intact.  HEENT: Head atraumatic, normocephalic. Oropharynx and nasopharynx clear.  NECK:  Supple, no jugular venous distention. No thyroid enlargement, no tenderness.  LUNGS: distant breath sounds bilaterally, no wheezing, rales, rhonchi. No use of accessory muscles of respiration. Coarse bs on right. Port  + CARDIOVASCULAR: S1, S2 normal. No murmurs, rubs, or gallops.  ABDOMEN: Soft, nontender, nondistended. Bowel sounds present. No organomegaly or mass.  EXTREMITIES: No cyanosis, clubbing or edema b/l.    NEUROLOGIC: Cranial nerves II through XII are intact. No focal Motor or sensory deficits b/l.   PSYCHIATRIC: The patient is alert and oriented x 3.  SKIN: No obvious rash, lesion, or ulcer.    LABORATORY PANEL:   CBC  Recent Labs Lab 10/05/15 0816  WBC 6.3  HGB 10.8*  HCT 33.3*  PLT 222    Chemistries   Recent Labs Lab 10/05/15 0816  10/08/15 0351  NA 135  --   --   K 4.0  < > 4.4  CL 106  --   --   CO2 24  --   --   GLUCOSE 227*  --   --   BUN 17  --   --   CREATININE 0.41*  --   --   CALCIUM 8.0*  --   --   MG 1.8  < > 2.0  AST 20  --   --   ALT 11*  --   --   ALKPHOS 63  --   --   BILITOT 0.3  --   --   < > = values in this interval not displayed.  Cardiac Enzymes  Recent Labs Lab 10/02/15 1140  TROPONINI 0.10*    RADIOLOGY:  No results found.   ASSESSMENT AND PLAN:   59 y.o. 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  1. Multifocal pneumonia likely aspiration pneumonia given issues with swallowing IV Levaquin qd Blood cultures negative  Breathing treatment as needed Remains afebrile  2. Odynophagia Status post radiation therapy completed 10 days ago for throat/tongue cancer chlorSeptic spray,Nystatin swish and swallow Speech therapy evaluation noted. Recommends to stay NPO since pt is aspirating all kinds of food. PEG tube today per dr Grant Lee  3. Cachexia and malnutrition Dietitian for TPN started on 10/04/2015  PEG  tube to be placed today  4.GERD continue Protonix  5. Lovenox for DVT prophylaxis  PT to see pt  Case discussed with Care Management/Social Worker. Management plans discussed with the patient, family and they are in agreement.  CODE STATUS: full  DVT Prophylaxis: lovenox  TOTAL TIME TAKING CARE OF THIS PATIENT: 30 minutes.  >50% time spent on counselling and coordination of care pt, family, ST  POSSIBLE D/C IN few DAYS, DEPENDING ON CLINICAL CONDITION. And placement of  PEG tube.   Grant Lee M.D on 10/08/2015 at 11:39 AM  Between 7am to 6pm - Pager - [phone]  After 6pm go to [email or web address] - password [email or web address] [place] [facility]  Office  [phone]  CC: Primary care physician; Grant Lee, Grant Lee

## 2015-10-08 NOTE — Progress Notes (Addendum)
Nutrition Follow-up  DOCUMENTATION CODES:   Severe malnutrition in context of chronic illness  INTERVENTION:  PN: Continue TPN of 5%AA/20% dextrose at 43m/hr at this time.  Noted BMP, Mg and Phos to be checked in am.    NUTRITION DIAGNOSIS:   Malnutrition related to chronic illness, cancer and cancer related treatments as evidenced by energy intake < or equal to 75% for > or equal to 1 month, percent weight loss, severe depletion of muscle mass, severe depletion of body fat.    GOAL:   Patient will meet greater than or equal to 90% of their needs    MONITOR:    (Energy Intake, anthropometrics, Electrolyte and renal Profile, Digestive System)  REASON FOR ASSESSMENT:   Malnutrition Screening Tool    ASSESSMENT:   Pt admitted wtih severe throat pain and pna. Pt with h/o throat cancer s/p chemotherapy and radiation, last chemotherapy 10 days PTA. Pt reports PEG fell out 2 weeks ago but has been able to take po's until last Thursday (11/17).  Spoke with Dtr at bedside this am and hopeful pt will get PEG tube today.   Current Nutrition: TPN infusing via portacath (single lumen) at this time  Urine output: 23076mlast 24 hr   Gastrointestinal Profile: Last BM: 11/25   Scheduled Medications:  . fentaNYL  50 mcg Transdermal Q72H  . insulin aspart  0-9 Units Subcutaneous 6 times per day  . nystatin  5 mL Oral QID    Continuous Medications:  . .TMarland KitchenN (CLINIMIX-E) Adult 80 mL/hr at 10/07/15 1722     Electrolyte/Renal Profile and Glucose Profile:   Recent Labs Lab 10/02/15 1140  10/04/15 1200 10/05/15 0816 10/06/15 0728 10/07/15 0745 10/08/15 0351  NA 135  --  138 135  --   --   --   K 3.9  --  3.0* 4.0 3.8 4.0 4.4  CL 103  --  109 106  --   --   --   CO2 24  --  20* 24  --   --   --   BUN 26*  --  15 17  --   --   --   CREATININE 0.47*  --  0.38* 0.41*  --   --   --   CALCIUM 8.8*  --  8.1* 8.0*  --   --   --   MG  --   < > 1.3* 1.8 1.5* 1.7 2.0  PHOS   --   < > 2.5 2.2* 2.3* 3.0 3.3  GLUCOSE 80  --  68 227*  --   --   --   < > = values in this interval not displayed. Protein Profile:  Recent Labs Lab 10/02/15 1140 10/05/15 0816  ALBUMIN 2.1* 1.7*      Weight Trend since Admission: Filed Weights   10/06/15 0520 10/07/15 0345 10/08/15 0356  Weight: 114 lb 3.2 oz (51.801 kg) 111 lb (50.349 kg) 108 lb (48.988 kg)      Diet Order:  Diet NPO time specified .TPN (CLINIMIX-E) Adult  Skin:  Reviewed, no issues   Height:   Ht Readings from Last 1 Encounters:  10/02/15 '6\' 2"'$  (1.88 m)    Weight:   Wt Readings from Last 1 Encounters:  10/08/15 108 lb (48.988 kg)    Ideal Body Weight:  86 kg  BMI:  Body mass index is 13.86 kg/(m^2).  Estimated Nutritional Needs:   Kcal:  using IBW of 86kg, BEE: 1740kcals, TEE: (IF 1.1-1.3)(AF  1.2) 2296-2714kcals  Protein:  95-111g protein (1.1-1.3g/kg)  Fluid:  2150-2559m of fluid (25-354mkg)  EDUCATION NEEDS:   No education needs identified at this time  HIGH Care Level  Haille Pardi B. AlZenia ResidesRDMiltonLDFarmingtonpager)

## 2015-10-08 NOTE — Progress Notes (Signed)
PT Cancellation Note  Patient Details Name: EGE MUCKEY MRN: [id number] DOB: 22-Jun-1956   Cancelled Treatment:    Reason Eval/Treat Not Completed: Patient at procedure or test/unavailable (Evaluation attempted.  Patient preparing to leave unit for PEG placement.  Will re-attempt treatment session next date as patient available and medically appropriate.)   Maximiliano Cromartie H. Owens Shark, PT, DPT, NCS 10/08/2015, 3:06 PM [phone]

## 2015-10-08 NOTE — Transfer of Care (Signed)
Immediate Anesthesia Transfer of Care Note  Patient: Grant Lee  Procedure(s) Performed: Procedure(s): PERCUTANEOUS ENDOSCOPIC GASTROSTOMY (PEG) PLACEMENT (N/A)  Patient Location: PACU  Anesthesia Type:General  Level of Consciousness: awake and oriented  Airway & Oxygen Therapy: Patient Spontanous Breathing and Patient connected to nasal cannula oxygen  Post-op Assessment: Report given to RN and Post -op Vital signs reviewed and stable  Post vital signs: Reviewed and stable  Last Vitals:  Filed Vitals:   10/08/15 1314 10/08/15 1534  BP: 98/65 92/64  Pulse: 43 81  Temp: 36.7 C 36.7 C  Resp:  15    Complications: No apparent anesthesia complications

## 2015-10-08 NOTE — Anesthesia Preprocedure Evaluation (Signed)
Anesthesia Evaluation  Patient identified by MRN, date of birth, ID band Patient awake    Reviewed: Allergy & Precautions, NPO status , Patient's Chart, lab work & pertinent test results  History of Anesthesia Complications Negative for: history of anesthetic complications  Airway Mallampati: II  TM Distance: >3 FB Neck ROM: Full   Comment: Had a base of tongue lesion biopsied 06/26/15--left base, still sore and perhaps friable. Cannot swallow. Dental  (+) Edentulous Upper, Edentulous Lower   Pulmonary pneumonia, resolved, former smoker,    Pulmonary exam normal        Cardiovascular hypertension, Pt. on medications Normal cardiovascular exam     Neuro/Psych negative neurological ROS  negative psych ROS   GI/Hepatic PUD, GERD  Medicated and Controlled,  Endo/Other    Renal/GU      Musculoskeletal  (+) Arthritis , Osteoarthritis,    Abdominal (+) + scaphoid  Abdomen: soft.    Peds  Hematology  (+) anemia ,   Anesthesia Other Findings CA of tongue  Reproductive/Obstetrics                             Anesthesia Physical Anesthesia Plan  ASA: III  Anesthesia Plan: General   Post-op Pain Management:    Induction: Intravenous  Airway Management Planned: Nasal Cannula  Additional Equipment:   Intra-op Plan:   Post-operative Plan:   Informed Consent: I have reviewed the patients History and Physical, chart, labs and discussed the procedure including the risks, benefits and alternatives for the proposed anesthesia with the patient or authorized representative who has indicated his/her understanding and acceptance.   Dental advisory given  Plan Discussed with: CRNA and Surgeon  Anesthesia Plan Comments:         Anesthesia Quick Evaluation

## 2015-10-08 NOTE — Anesthesia Postprocedure Evaluation (Signed)
Anesthesia Post Note  Patient: Grant Lee  Procedure(s) Performed: Procedure(s) (LRB): PERCUTANEOUS ENDOSCOPIC GASTROSTOMY (PEG) PLACEMENT (N/A)  Patient location during evaluation: Endoscopy Anesthesia Type: General Level of consciousness: awake, awake and alert and oriented Pain management: pain level controlled Vital Signs Assessment: post-procedure vital signs reviewed and stable Respiratory status: spontaneous breathing Cardiovascular status: blood pressure returned to baseline Anesthetic complications: no    Last Vitals:  Filed Vitals:   10/08/15 1645 10/08/15 1655  BP: 92/67 98/71  Pulse: 81 83  Temp:    Resp: 15 14    Last Pain:  Filed Vitals:   10/08/15 1657  PainSc: 6                  Hareem Surowiec

## 2015-10-08 NOTE — Consult Note (Signed)
GI Inpatient Follow-up Note  Patient Identification: Grant Lee is a 59 y.o. male with history of neck and mouth cancer who recently finished chemo (2 weeks ago) and radiation 1.5 weeks ago)  Subjective: He reports that he had the previous tube placed 3 or 4 months ago.  He reports having a coughing spell about 3 weeks ago, went to see his PCP and upon puling up his shirt, his PEG tube fell out.  He tried eating and drinking on his own until his throat became too painful to continue.  He presented to the  ED and was found to have pneumonia, possibly from aspiration.  He is scheduled for tube replacement this afternoon.  He reports last liquid or ice chips were last night.  He is on O2, does not use this at home.  Denies chest pain or SOB.  He is being treated for multifocal pneumonia with levaquin.  Dr. Rayann Lee initially placed the PEG on 07/06/2015. PEG  Was done using introducer technique instead of pull technique to avoid possibility of seeding the abdominal wall with throat cancer.  His last EGD was August 07, 2015 as an inpatient for the indications of nausea with vomiting. Dr. Gustavo Lee.   Impression; LA grade D reflux and erosive esophagitis.  Non-bleeding erosive gastropathy.  Gastric ulcer.  One duodenal ulcer with clean base.  Duodenitis.  H pylorus serology was negative.  He was last seen in our office on August 30, 2015 for a follow-up from his hospitalization at the end of September.  He reported at that time that his nausea and vomiting had improved after repositioning the PEG tube.  He reported at that time he was feeling very well, was no longer using his PEG for feedings due to the supplement. Jevity,  Giving him diarrhea every time he would use it.  He stated at that time he was swallowing his food well, had cat fish, cream potatoes, vegetables the night before without difficulty.  He was still taking omeprazole 40 mg daily and finished the Carafate he was given in the  hospital.  Scheduled Inpatient Medications:  . fentaNYL  50 mcg Transdermal Q72H  . insulin aspart  0-9 Units Subcutaneous 6 times per day  . [START ON 10/09/2015] levofloxacin  750 mg Per Tube Daily  . levofloxacin (LEVAQUIN) IV  750 mg Intravenous Once  . nystatin  5 mL Oral QID    Continuous Inpatient Infusions:   . Marland KitchenTPN (CLINIMIX-E) Adult 80 mL/hr at 10/07/15 1722  . Marland KitchenTPN (CLINIMIX-E) Adult      PRN Inpatient Medications:  [DISCONTINUED] acetaminophen **OR** acetaminophen, benzocaine, morphine injection, [DISCONTINUED] ondansetron **OR** ondansetron (ZOFRAN) IV, phenol, polyvinyl alcohol, sodium chloride  Review of Systems: Constitutional: Positive for weight loss. Eyes: No changes in vision. ENT: No oral lesions, sore throat.  GI: see HPI.  Heme/Lymph: No easy bruising.  CV: No chest pain.  GU: No hematuria.  Integumentary: No rashes.  Neuro: No headaches.  Psych: No depression/anxiety.  Endocrine: No heat/cold intolerance.  Allergic/Immunologic: No urticaria.  Resp: No cough, SOB.  Musculoskeletal: No joint swelling.    Physical Examination: BP 98/65 mmHg  Pulse 43  Temp(Src) 98 F (36.7 C) (Oral)  Resp 19  Ht '6\' 2"'$  (1.88 m)  Wt 48.988 kg (108 lb)  BMI 13.86 kg/m2  SpO2 97% Gen: NAD, alert and oriented x 4, pleasant, very thin male HEENT: PEERLA, EOMI, Neck: supple, no JVD or thyromegaly Chest: diminished breath sounds bilaterally, no wheezes, crackles, or other  adventitious sounds CV: RRR, no m/g/c/r Abd: soft, NT, ND, +BS in all four quadrants; no HSM, guarding, ridigity, or rebound tenderness Ext: no edema, well perfused with 2+ pulses, Skin: no rash or lesions noted Lymph: no LAD  Data: Lab Results  Component Value Date   WBC 6.3 10/05/2015   HGB 10.8* 10/05/2015   HCT 33.3* 10/05/2015   MCV 94.3 10/05/2015   PLT 222 10/05/2015    Recent Labs Lab 10/02/15 1140 10/05/15 0816  HGB 10.9* 10.8*   Lab Results  Component Value Date   NA 135  10/05/2015   K 4.4 10/08/2015   CL 106 10/05/2015   CO2 24 10/05/2015   BUN 17 10/05/2015   CREATININE 0.41* 10/05/2015   Lab Results  Component Value Date   ALT 11* 10/05/2015   AST 20 10/05/2015   ALKPHOS 63 10/05/2015   BILITOT 0.3 10/05/2015   No results for input(s): APTT, INR, PTT in the last 168 hours.    Assessment/Plan: Mr. Grant Lee is a 59 y.o. male  With throat and mouth cancer, recently finished chemo (2 weeks ago) and radiation (1.5 weeks ago).  Previous PEG fell out approx 3 weeks ago.  Last chest xray was 10/02/2015.  Barium swallow test showed aspiration.  Recommendations: Patient has been schedule for PEG tube placement with Dr. Vira Lee this afternoon.  We will continue to follow with you.  Please call with questions or concerns.  Grant Farber, PA-C  I personally performed these services.

## 2015-10-08 NOTE — Op Note (Signed)
New 20 Fr G tube placed in at the same site as previous site. Insertion site 1.5 cm. Can use G tube for flushings. Pt does have esophagitis and gastritis. Will need daily PPI.  Dr. Gustavo Lah to check on pt tomorrow. Can start TF tomorrow if stable.

## 2015-10-08 NOTE — Progress Notes (Signed)
PARENTERAL NUTRITION CONSULT NOTE - FOLLOW UP  Pharmacy Consult for electrolyte and blood glucose managment Indication: TPN  No Known Allergies  Patient Measurements: Height: '6\' 2"'$  (188 cm) Weight: 108 lb (48.988 kg) IBW/kg (Calculated) : 82.2  Labs: No results for input(s): WBC, HGB, HCT, PLT, APTT, INR in the last 72 hours.   Recent Labs  10/06/15 0728 10/07/15 0745 10/08/15 0351  K 3.8 4.0 4.4  MG 1.5* 1.7 2.0  PHOS 2.3* 3.0 3.3   Estimated Creatinine Clearance: 68.9 mL/min (by C-G formula based on Cr of 0.41).   BMP Latest Ref Rng 10/08/2015 10/07/2015 10/06/2015  Glucose 65 - 99 mg/dL - - -  BUN 6 - 20 mg/dL - - -  Creatinine 0.61 - 1.24 mg/dL - - -  Sodium 135 - 145 mmol/L - - -  Potassium 3.5 - 5.1 mmol/L 4.4 4.0 3.8  Chloride 101 - 111 mmol/L - - -  CO2 22 - 32 mmol/L - - -  Calcium 8.9 - 10.3 mg/dL - - -     Recent Labs  10/07/15 2354 10/08/15 0355 10/08/15 0715  GLUCAP 97 114* 119*   Assessment: Pharmacy consulted to monitor and supplement electrolytes and blood glucose in this 59 year old male on TPN. Current orders for Clinimix E 5/20 at 80 ml/hr  Insulin Requirements in the past 24 hours:  None  Plan:  Electrolytes WNL, no insulin required in past 24 hours. Will continue SSI and accuchecks. Recheck electrolytes with AM labs.   Pharmacy to follow per consult  Rexene Edison, PharmD Clinical Pharmacist  10/08/2015 8:35 AM

## 2015-10-09 LAB — GLUCOSE, CAPILLARY
GLUCOSE-CAPILLARY: 104 mg/dL — AB (ref 65–99)
GLUCOSE-CAPILLARY: 108 mg/dL — AB (ref 65–99)
GLUCOSE-CAPILLARY: 123 mg/dL — AB (ref 65–99)
Glucose-Capillary: 107 mg/dL — ABNORMAL HIGH (ref 65–99)
Glucose-Capillary: 118 mg/dL — ABNORMAL HIGH (ref 65–99)
Glucose-Capillary: 122 mg/dL — ABNORMAL HIGH (ref 65–99)
Glucose-Capillary: 89 mg/dL (ref 65–99)

## 2015-10-09 LAB — BASIC METABOLIC PANEL
ANION GAP: 4 — AB (ref 5–15)
BUN: 13 mg/dL (ref 6–20)
CALCIUM: 7.7 mg/dL — AB (ref 8.9–10.3)
CO2: 28 mmol/L (ref 22–32)
CREATININE: 0.33 mg/dL — AB (ref 0.61–1.24)
Chloride: 94 mmol/L — ABNORMAL LOW (ref 101–111)
Glucose, Bld: 118 mg/dL — ABNORMAL HIGH (ref 65–99)
Potassium: 4.7 mmol/L (ref 3.5–5.1)
SODIUM: 126 mmol/L — AB (ref 135–145)

## 2015-10-09 LAB — MAGNESIUM: MAGNESIUM: 1.8 mg/dL (ref 1.7–2.4)

## 2015-10-09 LAB — SODIUM: Sodium: 127 mmol/L — ABNORMAL LOW (ref 135–145)

## 2015-10-09 LAB — PHOSPHORUS: PHOSPHORUS: 3.7 mg/dL (ref 2.5–4.6)

## 2015-10-09 MED ORDER — SENNOSIDES-DOCUSATE SODIUM 8.6-50 MG PO TABS
1.0000 | ORAL_TABLET | Freq: Two times a day (BID) | ORAL | Status: DC
  Administered 2015-10-09 – 2015-10-10 (×3): 1
  Filled 2015-10-09 (×3): qty 1

## 2015-10-09 MED ORDER — OSMOLITE 1.5 CAL PO LIQD
120.0000 mL | Freq: Every day | ORAL | Status: DC
  Administered 2015-10-09 – 2015-10-10 (×5): 120 mL

## 2015-10-09 MED ORDER — ENOXAPARIN SODIUM 40 MG/0.4ML ~~LOC~~ SOLN
40.0000 mg | SUBCUTANEOUS | Status: DC
  Administered 2015-10-09: 20:00:00 40 mg via SUBCUTANEOUS
  Filled 2015-10-09: qty 0.4

## 2015-10-09 NOTE — NC FL2 (Signed)
[place] LEVEL OF CARE SCREENING TOOL     IDENTIFICATION  Patient Name: Grant Lee Birthdate: January 08, 1956 Sex: male Admission Date (Current Location): 10/02/2015  [place] and [place] Number: [facility] and Address:  [facility], [address] [place], [place] [address]      Provider Number: [id number]  Attending Physician Name and Address:  Fritzi Mandes, MD  Relative Name and Phone Number:       Current Level of Care: Hospital Recommended Level of Care: [place] Prior Approval Number:    Date Approved/Denied:   PASRR Number: [id number]  Discharge Plan: Home    Current Diagnoses: Patient Active Problem List   Diagnosis Date Noted  . Pneumonia 10/02/2015  . Malignant neoplasm of tongue ([place]) 08/30/2015  . PUD (peptic ulcer disease) 08/08/2015  . Acute esophagitis 08/08/2015  . Chronic pain syndrome 08/08/2015  . Esophagitis 08/08/2015  . Chemotherapy induced nausea and vomiting 08/03/2015  . N&V (nausea and vomiting) 08/03/2015  . Cancer of tongue ([place])   . Protein-calorie malnutrition, severe ([place]) 07/05/2015  . Severe protein-calorie malnutrition Altamease Oiler: less than 60% of standard weight) ([place]) 07/05/2015  . Malnutrition ([place]) 07/03/2015  . Aspiration pneumonitis ([place]) 07/03/2015  . Pneumonitis due to inhalation of food or vomitus ([place]) 07/03/2015  . DU (duodenal ulcer) 06/11/2015  . Tongue cancer ([place]) 06/11/2015  . Duodenal ulcer without hemorrhage or perforation 06/11/2015  . Abnormal loss of weight 10/09/2014  . Can't get food down 10/09/2014    Orientation ACTIVITIES/SOCIAL BLADDER RESPIRATION    Self, Time, Situation  Active Continent O2 (As needed) (5L)  BEHAVIORAL SYMPTOMS/MOOD NEUROLOGICAL BOWEL NUTRITION STATUS      Continent Feeding tube  PHYSICIAN VISITS COMMUNICATION OF NEEDS Height & Weight Skin  30 days Verbally '6\' 2"'$  (188 cm) 106 lbs. Normal           AMBULATORY STATUS RESPIRATION    Assist extensive O2 (As needed) (5L)      Personal Care Assistance Level of Assistance  Bathing, Feeding, Dressing Bathing Assistance: Limited assistance Feeding assistance: Limited assistance Dressing Assistance: Limited assistance      Functional Limitations [place]  PT (By licensed PT)                   Additional Factors Info  Code Status, Allergies Code Status Info: Full Code Allergies Info: No Known Allergies           Current Medications (10/09/2015):  This is the current hospital active medication list Current Facility-Administered Medications  Medication Dose Route Frequency Provider Last Rate Last Dose  . Marland KitchenTPN (CLINIMIX-E) Adult   Intravenous Continuous TPN Fritzi Mandes, MD 40 mL/hr at 10/09/15 0939    . acetaminophen (TYLENOL) suppository 650 mg  650 mg Rectal Q6H PRN Fritzi Mandes, MD      . benzocaine (ORAJEL) 10 % mucosal gel   Mouth/Throat TID PRN Fritzi Mandes, MD      . enoxaparin (LOVENOX) injection 40 mg  40 mg Subcutaneous Q24H Fritzi Mandes, MD      . feeding supplement (OSMOLITE 1.5 CAL) liquid 120 mL  120 mL Per Tube 5 X Daily Fritzi Mandes, MD   120 mL at 10/09/15 1300  . fentaNYL (DURAGESIC - dosed mcg/hr) 50 mcg  50 mcg Transdermal Q72H Fritzi Mandes, MD   50 mcg at 10/08/15 1814  .  insulin aspart (novoLOG) injection 0-9 Units  0-9 Units Subcutaneous 6 times per day Lenis Noon, [facility]   1 Units at 10/09/15 0755  . levofloxacin (LEVAQUIN) 25 MG/ML solution 750 mg  750 mg Per Tube Daily Fritzi Mandes, MD   750 mg at 10/09/15 1300  . morphine 4 MG/ML injection 3 mg  3 mg Intravenous Q4H PRN Fritzi Mandes, MD   3 mg at 10/09/15 1122  . nystatin (MYCOSTATIN) 100000 UNIT/ML suspension 500,000 Units  5 mL Oral QID Fritzi Mandes, MD   500,000 Units at 10/08/15 1740  . ondansetron (ZOFRAN) injection 4 mg  4 mg Intravenous Q6H PRN Fritzi Mandes, MD      . phenol (CHLORASEPTIC) mouth spray 1 spray  1  spray Mouth/Throat PRN Fritzi Mandes, MD   1 spray at 10/05/15 0051  . polyvinyl alcohol (LIQUIFILM TEARS) 1.4 % ophthalmic solution 1 drop  1 drop Both Eyes PRN Lance Coon, MD   1 drop at 10/06/15 2141  . senna-docusate (Senokot-S) tablet 1 tablet  1 tablet Per Tube BID Fritzi Mandes, MD      . sodium chloride 0.9 % injection 10 mL  10 mL Intravenous PRN Fritzi Mandes, MD   10 mL at 10/04/15 1207   Facility-Administered Medications Ordered in Other Encounters  Medication Dose Route Frequency Provider Last Rate Last Dose  . sodium chloride 0.9 % injection 10 mL  10 mL Intracatheter PRN Evlyn Kanner, NP   10 mL at 09/10/15 1884     Discharge Medications: Please see discharge summary for a list of discharge medications.  Relevant Imaging Results:  Relevant Lab Results:  Recent Labs    Additional Information    Darden Dates, LCSW

## 2015-10-09 NOTE — Evaluation (Signed)
Physical Therapy Evaluation Patient Details Name: Grant Lee MRN: [id number] DOB: 1956-07-04 Today's Date: 10/09/2015   History of Present Illness  presented to ER status post fall in home environment with severe throat pain (recurrent throat/tongue cancer); admitted with multi-lobar PNA and odynophagia.  Status post PEG replacement 11/28  Clinical Impression  Upon evaluation, patient alert and oriented, follows all commands; eager to participate with therapy, but extremely limited in activity tolerance.  Bilat UE/LE strength generally weak and deconditioned; reports chronic soreness in abdomen 6/10 throughout session.  Currently requiring min/mod assist for bed mobility and sit/stand with RW.  Additional mobility or OOB efforts limited by symptomatic orthostasis; RN informed/aware.  Would benefit from skilled PT to address above deficits and promote optimal return to PLOF; recommend transition to STR upon discharge from acute hospitalization.     Follow Up Recommendations SNF    Equipment Recommendations       Recommendations for Other Services       Precautions / Restrictions Precautions Precautions: Fall Precaution Comments: NPO Restrictions Weight Bearing Restrictions: No      Mobility  Bed Mobility Overal bed mobility: Needs Assistance Bed Mobility: Supine to Sit;Sit to Supine     Supine to sit: Min assist Sit to supine: Min assist      Transfers Overall transfer level: Needs assistance Equipment used: Rolling walker (2 wheeled) Transfers: Sit to/from Stand Sit to Stand: Min assist;Mod assist         General transfer comment: cuing for hand placement; assist for lift off  Ambulation/Gait             General Gait Details: unsafe/unable due to symptomatic orthostasis  Stairs            Wheelchair Mobility    Modified Rankin (Stroke Patients Only)       Balance Overall balance assessment: Needs assistance Sitting-balance  support: No upper extremity supported;Feet supported Sitting balance-Leahy Scale: Good     Standing balance support: Bilateral upper extremity supported Standing balance-Leahy Scale: Fair                               Pertinent Vitals/Pain Pain Assessment: 0-10 Pain Score: 6  Pain Location: soreness in abdomen Pain Intervention(s): Limited activity within patient's tolerance;Monitored during session;Premedicated before session;Repositioned    Home Living Family/patient expects to be discharged to:: Private residence Living Arrangements: Spouse/significant other Available Help at Discharge: Family Type of Home: House Home Access: Stairs to enter Entrance Stairs-Rails: Right;Left;Can reach both Entrance Stairs-Number of Steps: 3-6 Home Layout: One level Home Equipment: Environmental consultant - 2 wheels;Cane - single point;Shower seat;Bedside commode      Prior Function Level of Independence: Independent with assistive device(s)         Comments: reports being able to walk with Grant Lee independently; independent in self care ADLs.  Does endorse single fall in recent weeks     Hand Dominance        Extremity/Trunk Assessment   Upper Extremity Assessment: Generalized weakness           Lower Extremity Assessment: Generalized weakness (globally at least 3/5 throughout)         Communication   Communication: No difficulties  Cognition Arousal/Alertness: Awake/alert Behavior During Therapy: WFL for tasks assessed/performed Overall Cognitive Status: Within Functional Limits for tasks assessed  General Comments General comments (skin integrity, edema, etc.): PEG insertion site, clean and dry    Exercises Other Exercises Other Exercises: Education on orthostasis and ways to progress; encouraged OOB to chair with nursing this PM.  Patient/wife voiced understanding. Other Exercises: Simple standing LE therex, 1x5 with RW, mod assist to promote  accommodation to upright position. Unable to tolerate beyond 3-4 minutes due to dizziness and fatigue      Assessment/Plan    PT Assessment Patient needs continued PT services  PT Diagnosis Difficulty walking;Generalized weakness;Acute pain   PT Problem List Decreased strength;Decreased activity tolerance;Decreased balance;Decreased mobility;Decreased cognition;Decreased knowledge of use of DME;Decreased safety awareness;Decreased knowledge of precautions;Pain;Decreased skin integrity;Cardiopulmonary status limiting activity  PT Treatment Interventions DME instruction;Gait training;Stair training;Functional mobility training;Therapeutic activities;Therapeutic exercise;Balance training;Cognitive remediation;Patient/family education   PT Goals (Current goals can be found in the Care Plan section) Acute Rehab PT Goals Patient Stated Goal: "to get up a little bit" PT Goal Formulation: With patient/family Time For Goal Achievement: 10/23/15 Potential to Achieve Goals: Good    Frequency Min 2X/week   Barriers to discharge Inaccessible home environment      Co-evaluation               End of Session Equipment Utilized During Treatment: Gait belt Activity Tolerance: Patient limited by fatigue Patient left: in bed;with call bell/phone within reach;with bed alarm set;with family/visitor present Nurse Communication: Mobility status         Time: 6067-7034 PT Time Calculation (min) (ACUTE ONLY): 33 min   Charges:   PT Evaluation $Initial PT Evaluation Tier I: 1 Procedure PT Treatments $Therapeutic Activity: 8-22 mins   PT G Codes:        Grant Lee, PT, DPT, NCS 10/09/2015, 12:29 PM [phone]

## 2015-10-09 NOTE — Clinical Social Work Note (Signed)
Clinical Social Work Assessment  Patient Details  Name: Grant Lee MRN: [id number] Date of Birth: 1956-09-16  Date of referral:  10/09/15               Reason for consult:  Facility Placement                Permission sought to share information with:  Family Supports Permission granted to share information::  Yes, Verbal Permission Granted  Name::      (wife, [name] )   Housing/Transportation Living arrangements for the past 2 months:  Single Family Home Source of Information:  Patient Patient Interpreter Needed:  None Criminal Activity/Legal Involvement Pertinent to Current Situation/Hospitalization:  No - Comment as needed Significant Relationships:  Spouse Lives with:  Spouse Do you feel safe going back to the place where you live?  Yes Need for family participation in patient care:  Yes (Comment)  Care giving concerns:  No care giving concerns identified.    Social Worker assessment / plan:  CSW met with pt to address consult for SNF. CSW introduced herself and explained role of social work. CSW also explained the process of the discharging to SNF. CSW initiated SNF search and will follow up with bed offers. CSW also updated pt's wife. Per MD, pt will be ready for discharge tomorrow. CSW will continue to follow.   Employment status:  Disabled (Comment on whether or not currently receiving Disability) Insurance information:  Managed Medicare PT Recommendations:  [place] / Referral to community resources:  [place]  Patient/Family's Response to care:  Pt was appreciative of CSW support.   Patient/Family's Understanding of and Emotional Response to Diagnosis, Current Treatment, and Prognosis:  Pt and pt's wife understand that pt will need SNF prior to returning home.   Emotional Assessment Appearance:  Appears stated age Attitude/Demeanor/Rapport:   (Appropriate) Affect (typically observed):  Pleasant Orientation:   Oriented to Self, Oriented to Place, Oriented to  Time, Oriented to Situation Alcohol / Substance use:  Never Used Psych involvement (Current and /or in the community):  No (Comment)  Discharge Needs  Concerns to be addressed:  No discharge needs identified Readmission within the last 30 days:  No Current discharge risk:  None Barriers to Discharge:  No Barriers Identified   Darden Dates, LCSW 10/09/2015, 3:23 PM

## 2015-10-09 NOTE — Progress Notes (Signed)
PARENTERAL NUTRITION CONSULT NOTE - FOLLOW UP  Pharmacy Consult for electrolyte and blood glucose managment Indication: TPN  No Known Allergies  Patient Measurements: Height: '6\' 2"'$  (188 cm) Weight: 106 lb 6.4 oz (48.263 kg) IBW/kg (Calculated) : 82.2  Labs: No results for input(s): WBC, HGB, HCT, PLT, APTT, INR in the last 72 hours.   Recent Labs  10/07/15 0745 10/08/15 0351 10/09/15 0439  NA  --   --  126*  K 4.0 4.4 4.7  CL  --   --  94*  CO2  --   --  28  GLUCOSE  --   --  118*  BUN  --   --  13  CREATININE  --   --  0.33*  CALCIUM  --   --  7.7*  MG 1.7 2.0 1.8  PHOS 3.0 3.3 3.7   Estimated Creatinine Clearance: 67.9 mL/min (by C-G formula based on Cr of 0.33).   Recent Labs  10/09/15 0006 10/09/15 0414 10/09/15 0714  GLUCAP 108* 104* 123*   Assessment: Pharmacy consulted to monitor and supplement electrolytes and blood glucose in this 59 year old male on TPN. Current orders for Clinimix E 5/20 at 80 ml/hr  PEG tube placed today, plan to switch to tube feeds today/tomorrow?  Insulin Requirements in the past 24 hours:  2 units  Plan:  Electrolytes WNL, required 2 units SSI/24hrs. Will continue SSI and accuchecks. Recheck electrolytes with AM labs.   Pharmacy to follow per consult  Rexene Edison, PharmD Clinical Pharmacist  10/09/2015 8:41 AM

## 2015-10-09 NOTE — Progress Notes (Addendum)
Nutrition Follow-up  DOCUMENTATION CODES:   Severe malnutrition in context of chronic illness  INTERVENTION:   EN: discussed options for feeding modality with pt; discussed bolus vs continuous; reviewed that continuous feeding does not necessarily have to be for 24 hour period but can be for 12, 14, 16 hours, etc. Pt has been on bolus feedings in the past and has had difficulty tolerating at times including N/V and diarrhea; pt is adamant that the TF formula not be Jevity has he has had diarrhea with this in the past. Due to chronic gastritis and issues as above, recommend trying Osmolite 1.5 (low residue, calorically dense formula); discussed with pt and he is agreeable. Pt would like to try bolus feedings but is concerned that he will not be able to tolerate the volume and understands that if he cannot tolerate the necessary volume to meet nutritional needs then may need to change to continuous feeding. Pt agreeable to starting with 120 mL (1/2 can) at feedings today; will plan for total of 5 feedings per day; goal is 1 can per feeding (provides 75 g of protein, 1775 kcals); ultimately goal rate to meet estimated nutritional needs is 6 cans per day but as pt is severely malnourished and underweight with BMI 13.66, 5 cans should promote wt gain at this point and this can be assessed further as outpatient.   Coordination of Care: may need bowel regimen as last BM documented 11/25; discussed hyponatremia with MD Posey Pronto, TPN may be contributing to this. Plan to reduce TPN to rate of 40 ml/hr this AM with plans to not reorder TPN tonight as pt starting on TF  NUTRITION DIAGNOSIS:   Malnutrition related to chronic illness, cancer and cancer related treatments as evidenced by energy intake < or equal to 75% for > or equal to 1 month, percent weight loss, severe depletion of muscle mass, severe depletion of body fat. Being addresed via TF  GOAL:   Patient will meet greater than or equal to 90% of their  needs  MONITOR:    (Energy Intake, anthropometrics, Electrolyte and renal Profile, Digestive System)  REASON FOR ASSESSMENT:   Consult Enteral/tube feeding initiation and management  ASSESSMENT:   Pt admitted wtih severe throat pain and pna. Pt with h/o throat cancer s/p chemotherapy and radiation, last chemotherapy 10 days PTA. Pt reports PEG fell out 2 weeks ago but has been able to take po's until last Thursday (11/17).  Pt s/p 20 french PEG tube placement yesterday, EGD with esophagitis, chronic gastritis. Pt reports he remains very weak, concerned about discharging home as pt reports he has not even gotten out of bed since being hospitalized, noted PT has been consulted, noted care management consult for home health  Diet Order:  Diet NPO time specified .TPN (CLINIMIX-E) Adult   PN: 5%AA/20%Dextrose infusing at rate of 80 ml/hr via single lumen implanted port  Electrolyte and Renal Profile:  Recent Labs Lab 10/04/15 1200 10/05/15 0816  10/07/15 0745 10/08/15 0351 10/09/15 0439  BUN 15 17  --   --   --  13  CREATININE 0.38* 0.41*  --   --   --  0.33*  NA 138 135  --   --   --  126*  K 3.0* 4.0  < > 4.0 4.4 4.7  MG 1.3* 1.8  < > 1.7 2.0 1.8  PHOS 2.5 2.2*  < > 3.0 3.3 3.7  < > = values in this interval not displayed. Glucose Profile:  Recent Labs  10/09/15 0006 10/09/15 0414 10/09/15 0714  GLUCAP 108* 104* 123*   Meds: ss novolog   Skin:  Reviewed, no issues  Last BM:   11/25  Electrolyte and Renal Profile:  Recent Labs Lab 10/04/15 1200 10/05/15 0816  10/07/15 0745 10/08/15 0351 10/09/15 0439  BUN 15 17  --   --   --  13  CREATININE 0.38* 0.41*  --   --   --  0.33*  NA 138 135  --   --   --  126*  K 3.0* 4.0  < > 4.0 4.4 4.7  MG 1.3* 1.8  < > 1.7 2.0 1.8  PHOS 2.5 2.2*  < > 3.0 3.3 3.7  < > = values in this interval not displayed. Glucose Profile:   Recent Labs  10/09/15 0006 10/09/15 0414 10/09/15 0714  GLUCAP 108* 104* 123*     Height:   Ht Readings from Last 1 Encounters:  10/08/15 '6\' 2"'$  (1.88 m)    Weight:   Wt Readings from Last 1 Encounters:  10/09/15 106 lb 6.4 oz (48.263 kg)   Filed Weights   10/08/15 0356 10/08/15 1534 10/09/15 0431  Weight: 108 lb (48.988 kg) 108 lb (48.988 kg) 106 lb 6.4 oz (48.263 kg)    Ideal Body Weight:  86 kg (189 pounds)  BMI:  Body mass index is 13.66 kg/(m^2).  Estimated Nutritional Needs:   Kcal:  2376-2831 kcals (BEE 1313, 1.3 AF, 1.1-1.3 IF) using current wt of 48.3 kg or 2150-2580 kcals (25-30 kcals/kg) using IBW 86 kg  Protein:  58-72 g (1.2-1.5 g/kg) using current wt of 48.3 kg  Fluid:  1440-1680 mL (30-35 ml/kg)   EDUCATION NEEDS:   No education needs identified at this time  [place], RD, LDN [phone] Pager

## 2015-10-09 NOTE — Clinical Social Work Placement (Signed)
   CLINICAL SOCIAL WORK PLACEMENT  NOTE  Date:  10/09/2015  Patient Details  Name: Grant Lee MRN: [id number] Date of Birth: 1956/01/05  Clinical Social Work is seeking post-discharge placement for this patient at the [place] level of care (*CSW will initial, date and re-position this form in  chart as items are completed):  Yes   Patient/family provided with [facility] Work Department's list of facilities offering this level of care within the geographic area requested by the patient (or if unable, by the patient's family).  Yes   Patient/family informed of their freedom to choose among providers that offer the needed level of care, that participate in Medicare, Medicaid or managed care program needed by the patient, have an available bed and are willing to accept the patient.  Yes   Patient/family informed of [facility]'s ownership interest in [facility] and [facility], as well as of the fact that they are under no obligation to receive care at these facilities.  PASRR submitted to EDS on 10/09/15     PASRR number received on 10/09/15     Existing PASRR number confirmed on       FL2 transmitted to all facilities in geographic area requested by pt/family on 10/09/15     FL2 transmitted to all facilities within larger geographic area on       Patient informed that his/her managed care company has contracts with or will negotiate with certain facilities, including the following:            Patient/family informed of bed offers received.  Patient chooses bed at       Physician recommends and patient chooses bed at      Patient to be transferred to   on  .  Patient to be transferred to facility by       Patient family notified on   of transfer.  Name of family member notified:        PHYSICIAN       Additional Comment:    _______________________________________________ Darden Dates, LCSW 10/09/2015, 2:10 PM

## 2015-10-09 NOTE — Progress Notes (Addendum)
Patient ID: Grant Lee, male   DOB: 01/07/1956, 59 y.o.   MRN: [id number] [facility] [place] NAME: Grant Lee    MR#:  [id number]  DATE OF BIRTH:  1956/10/01  SUBJECTIVE:   Throat been much better. gen weakness PEG tube feeding started  REVIEW OF SYSTEMS:   Review of Systems  Constitutional: Negative for fever, chills and weight loss.  HENT: Positive for sore throat. Negative for ear discharge, ear pain and nosebleeds.   Eyes: Negative for blurred vision, pain and discharge.  Respiratory: Negative for sputum production, shortness of breath, wheezing and stridor.   Cardiovascular: Negative for chest pain, palpitations, orthopnea and PND.  Gastrointestinal: Negative for nausea, vomiting, abdominal pain and diarrhea.  Genitourinary: Negative for urgency and frequency.  Musculoskeletal: Negative for back pain and joint pain.  Neurological: Positive for weakness. Negative for sensory change, speech change and focal weakness.  Psychiatric/Behavioral: Negative for depression and hallucinations. The patient is not nervous/anxious.   All other systems reviewed and are negative.  Tolerating Diet:npo  DRUG ALLERGIES:  No Known Allergies  VITALS:  Blood pressure 98/63, pulse 77, temperature 98.7 F (37.1 C), temperature source Oral, resp. rate 19, height '6\' 2"'$  (1.88 m), weight 106 lb 6.4 oz (48.263 kg), SpO2 96 %.  PHYSICAL EXAMINATION:   Physical Exam  GENERAL:  59 y.o.-year-old patient lying in the bed with no acute distress. Thin, cachectic EYES: Pupils equal, round, reactive to light and accommodation. No scleral icterus. Extraocular muscles intact.  HEENT: Head atraumatic, normocephalic. Oropharynx and nasopharynx clear.  NECK:  Supple, no jugular venous distention. No thyroid enlargement, no tenderness.  LUNGS: distant breath sounds bilaterally, no wheezing, rales, rhonchi. No use of accessory muscles of  respiration. Coarse bs on right. Port + CARDIOVASCULAR: S1, S2 normal. No murmurs, rubs, or gallops.  ABDOMEN: Soft, nontender, nondistended. Bowel sounds present. No organomegaly or mass. PEG + EXTREMITIES: No cyanosis, clubbing or edema b/l.    NEUROLOGIC: Cranial nerves II through XII are intact. No focal Motor or sensory deficits b/l.   PSYCHIATRIC: The patient is alert and oriented x 3.  SKIN: No obvious rash, lesion, or ulcer.    LABORATORY PANEL:   CBC  Recent Labs Lab 10/05/15 0816  WBC 6.3  HGB 10.8*  HCT 33.3*  PLT 222    Chemistries   Recent Labs Lab 10/05/15 0816  10/09/15 0439 10/09/15 1014  NA 135  --  126* 127*  K 4.0  < > 4.7  --   CL 106  --  94*  --   CO2 24  --  28  --   GLUCOSE 227*  --  118*  --   BUN 17  --  13  --   CREATININE 0.41*  --  0.33*  --   CALCIUM 8.0*  --  7.7*  --   MG 1.8  < > 1.8  --   AST 20  --   --   --   ALT 11*  --   --   --   ALKPHOS 63  --   --   --   BILITOT 0.3  --   --   --   < > = values in this interval not displayed.  Cardiac Enzymes No results for input(s): TROPONINI in the last 168 hours.  RADIOLOGY:  No results found.   ASSESSMENT AND PLAN:   59 y.o. 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  1. Multifocal pneumonia likely aspiration pneumonia given issues with swallowing liquid Levaquin qd Blood cultures negative  Breathing treatment as needed Remains afebrile  2. Odynophagia Status post radiation therapy completed 10 days ago for throat/tongue cancer chlorSeptic spray,Nystatin swish and swallow Speech therapy evaluation noted. Recommends to stay NPO since pt is aspirating all  kinds of food. PEG tubeplaced on 10/08/15 by Dr Candace Cruise  3. Cachexia and malnutrition Dietitian for TPN started on 10/04/2015  PEG feeding initiated  4.hyponatremia -suspect could be due to TPN /volume -pt asymptomatic Rest electrolytes ok -will try to minimize free water flushes thru PEG  5. Lovenox for DVT prophylaxis  PT recommends rehab  CSW for d/c planning.   Case discussed with Care Management/Social Worker. Management plans discussed with the patient, family and they are in agreement.  CODE STATUS: full  DVT Prophylaxis: lovenox  TOTAL TIME TAKING CARE OF THIS PATIENT: 30 minutes.  >50% time spent on counselling and coordination of care pt, family, ST  POSSIBLE D/C IN few DAYS, DEPENDING ON CLINICAL CONDITION. And placement of  PEG tube.   Mir Fullilove M.D on 10/09/2015 at 4:22 PM  Between 7am to 6pm - Pager - [phone]  After 6pm go to [email or web address] - password [email or web address] [facility]  Office  [phone]  CC: Primary care physician; Cyndi Bender, [name]

## 2015-10-10 ENCOUNTER — Encounter: Payer: Self-pay | Admitting: Unknown Physician Specialty

## 2015-10-10 DIAGNOSIS — D508 Other iron deficiency anemias: Secondary | ICD-10-CM | POA: Diagnosis not present

## 2015-10-10 DIAGNOSIS — F1010 Alcohol abuse, uncomplicated: Secondary | ICD-10-CM | POA: Diagnosis not present

## 2015-10-10 DIAGNOSIS — Z85810 Personal history of malignant neoplasm of tongue: Secondary | ICD-10-CM | POA: Diagnosis not present

## 2015-10-10 DIAGNOSIS — F1120 Opioid dependence, uncomplicated: Secondary | ICD-10-CM | POA: Diagnosis not present

## 2015-10-10 DIAGNOSIS — J90 Pleural effusion, not elsewhere classified: Secondary | ICD-10-CM | POA: Diagnosis not present

## 2015-10-10 DIAGNOSIS — C028 Malignant neoplasm of overlapping sites of tongue: Secondary | ICD-10-CM | POA: Diagnosis not present

## 2015-10-10 DIAGNOSIS — J189 Pneumonia, unspecified organism: Secondary | ICD-10-CM | POA: Diagnosis not present

## 2015-10-10 DIAGNOSIS — R1310 Dysphagia, unspecified: Secondary | ICD-10-CM | POA: Diagnosis not present

## 2015-10-10 DIAGNOSIS — M6281 Muscle weakness (generalized): Secondary | ICD-10-CM | POA: Diagnosis not present

## 2015-10-10 DIAGNOSIS — R278 Other lack of coordination: Secondary | ICD-10-CM | POA: Diagnosis not present

## 2015-10-10 DIAGNOSIS — E876 Hypokalemia: Secondary | ICD-10-CM | POA: Diagnosis not present

## 2015-10-10 DIAGNOSIS — D72829 Elevated white blood cell count, unspecified: Secondary | ICD-10-CM | POA: Diagnosis not present

## 2015-10-10 DIAGNOSIS — K219 Gastro-esophageal reflux disease without esophagitis: Secondary | ICD-10-CM | POA: Diagnosis not present

## 2015-10-10 DIAGNOSIS — C01 Malignant neoplasm of base of tongue: Secondary | ICD-10-CM | POA: Diagnosis not present

## 2015-10-10 DIAGNOSIS — Z79899 Other long term (current) drug therapy: Secondary | ICD-10-CM | POA: Diagnosis not present

## 2015-10-10 DIAGNOSIS — E871 Hypo-osmolality and hyponatremia: Secondary | ICD-10-CM | POA: Diagnosis not present

## 2015-10-10 DIAGNOSIS — C029 Malignant neoplasm of tongue, unspecified: Secondary | ICD-10-CM | POA: Diagnosis not present

## 2015-10-10 DIAGNOSIS — G894 Chronic pain syndrome: Secondary | ICD-10-CM | POA: Diagnosis not present

## 2015-10-10 DIAGNOSIS — R262 Difficulty in walking, not elsewhere classified: Secondary | ICD-10-CM | POA: Diagnosis not present

## 2015-10-10 DIAGNOSIS — R42 Dizziness and giddiness: Secondary | ICD-10-CM | POA: Diagnosis not present

## 2015-10-10 DIAGNOSIS — Z923 Personal history of irradiation: Secondary | ICD-10-CM | POA: Diagnosis not present

## 2015-10-10 DIAGNOSIS — E43 Unspecified severe protein-calorie malnutrition: Secondary | ICD-10-CM | POA: Diagnosis not present

## 2015-10-10 DIAGNOSIS — J690 Pneumonitis due to inhalation of food and vomit: Secondary | ICD-10-CM | POA: Diagnosis not present

## 2015-10-10 DIAGNOSIS — Z9221 Personal history of antineoplastic chemotherapy: Secondary | ICD-10-CM | POA: Diagnosis not present

## 2015-10-10 DIAGNOSIS — Z931 Gastrostomy status: Secondary | ICD-10-CM | POA: Diagnosis not present

## 2015-10-10 DIAGNOSIS — E7800 Pure hypercholesterolemia, unspecified: Secondary | ICD-10-CM | POA: Diagnosis not present

## 2015-10-10 DIAGNOSIS — D649 Anemia, unspecified: Secondary | ICD-10-CM | POA: Diagnosis not present

## 2015-10-10 DIAGNOSIS — Z87891 Personal history of nicotine dependence: Secondary | ICD-10-CM | POA: Diagnosis not present

## 2015-10-10 DIAGNOSIS — R1312 Dysphagia, oropharyngeal phase: Secondary | ICD-10-CM | POA: Diagnosis not present

## 2015-10-10 DIAGNOSIS — R531 Weakness: Secondary | ICD-10-CM | POA: Diagnosis not present

## 2015-10-10 DIAGNOSIS — I10 Essential (primary) hypertension: Secondary | ICD-10-CM | POA: Diagnosis not present

## 2015-10-10 LAB — GLUCOSE, CAPILLARY
GLUCOSE-CAPILLARY: 125 mg/dL — AB (ref 65–99)
GLUCOSE-CAPILLARY: 66 mg/dL (ref 65–99)
Glucose-Capillary: 99 mg/dL (ref 65–99)
Glucose-Capillary: 103 mg/dL — ABNORMAL HIGH (ref 65–99)
Glucose-Capillary: 114 mg/dL — ABNORMAL HIGH (ref 65–99)

## 2015-10-10 LAB — BASIC METABOLIC PANEL
Anion gap: 5 (ref 5–15)
BUN: 13 mg/dL (ref 6–20)
CALCIUM: 7.6 mg/dL — AB (ref 8.9–10.3)
CO2: 28 mmol/L (ref 22–32)
Chloride: 93 mmol/L — ABNORMAL LOW (ref 101–111)
Creatinine, Ser: <0.3 mg/dL — ABNORMAL LOW (ref 0.61–1.24)
Glucose, Bld: 114 mg/dL — ABNORMAL HIGH (ref 65–99)
POTASSIUM: 4.4 mmol/L (ref 3.5–5.1)
SODIUM: 126 mmol/L — AB (ref 135–145)

## 2015-10-10 LAB — URIC ACID: URIC ACID, SERUM: 1.3 mg/dL — AB (ref 4.4–7.6)

## 2015-10-10 LAB — CORTISOL: CORTISOL PLASMA: 10.3 ug/dL

## 2015-10-10 LAB — PHOSPHORUS: Phosphorus: 3.8 mg/dL (ref 2.5–4.6)

## 2015-10-10 LAB — MAGNESIUM: MAGNESIUM: 1.9 mg/dL (ref 1.7–2.4)

## 2015-10-10 LAB — OSMOLALITY: Osmolality: 262 mOsm/kg — ABNORMAL LOW (ref 275–295)

## 2015-10-10 LAB — TSH: TSH: 2.188 u[IU]/mL (ref 0.350–4.500)

## 2015-10-10 LAB — SODIUM, URINE, RANDOM: Sodium, Ur: 77 mmol/L

## 2015-10-10 MED ORDER — DIPHENHYDRAMINE HCL 50 MG/ML IJ SOLN
25.0000 mg | Freq: Once | INTRAMUSCULAR | Status: AC
  Administered 2015-10-10: 25 mg via INTRAVENOUS
  Filled 2015-10-10: qty 1

## 2015-10-10 MED ORDER — DEXTROSE 50 % IV SOLN
INTRAVENOUS | Status: AC
  Administered 2015-10-10: 04:00:00 25 g
  Filled 2015-10-10: qty 50

## 2015-10-10 MED ORDER — OSMOLITE 1.5 CAL PO LIQD: 240.0000 mL | Freq: Every day | ORAL | Status: DC

## 2015-10-10 MED ORDER — POLYVINYL ALCOHOL 1.4 % OP SOLN: 1.0000 [drp] | OPHTHALMIC | Status: DC | PRN

## 2015-10-10 MED ORDER — MORPHINE SULFATE 20 MG/5ML PO SOLN: 5.0000 mg | Freq: Four times a day (QID) | ORAL | Status: DC | PRN

## 2015-10-10 MED ORDER — HEPARIN SOD (PORK) LOCK FLUSH 100 UNIT/ML IV SOLN
500.0000 [IU] | INTRAVENOUS | Status: AC | PRN
  Administered 2015-10-10: 500 [IU]
  Filled 2015-10-10: qty 5

## 2015-10-10 MED ORDER — MORPHINE SULFATE (PF) 4 MG/ML IV SOLN
3.0000 mg | Freq: Once | INTRAVENOUS | Status: AC
  Administered 2015-10-10: 3 mg via INTRAMUSCULAR
  Filled 2015-10-10: qty 1

## 2015-10-10 MED ORDER — OSMOLITE 1.5 CAL PO LIQD
240.0000 mL | Freq: Every day | ORAL | Status: DC
  Administered 2015-10-10 (×3): 240 mL

## 2015-10-10 MED ORDER — FUROSEMIDE 20 MG PO TABS
20.0000 mg | ORAL_TABLET | Freq: Every day | ORAL | Status: DC
  Administered 2015-10-10: 14:00:00 20 mg via ORAL
  Filled 2015-10-10: qty 1

## 2015-10-10 MED ORDER — SODIUM CHLORIDE 1 G PO TABS
1.0000 g | ORAL_TABLET | Freq: Two times a day (BID) | ORAL | Status: DC
  Administered 2015-10-10: 1 g via ORAL
  Filled 2015-10-10: qty 1

## 2015-10-10 MED ORDER — LEVOFLOXACIN 25 MG/ML PO SOLN: 750.0000 mg | Freq: Every day | ORAL | Status: AC

## 2015-10-10 NOTE — Consult Note (Signed)
[facility] CONSULT NOTE    Date: 10/10/2015                  Patient Name:  Grant Lee  MRN: [id number]  DOB: [birth year]  Age / Sex: 59 y.o., male         PCP: Fae Pippin                 Service Requesting Consult: Dr. Fritzi Mandes                 Reason for Consult: Hyponatremia            History of Present Illness: Patient is a 59 y.o. male with a PMHx of hypertension, GERD, osteoarthritis, history of machinery accident resulting in leg injury, oral cancer, history of throat cancer, who was admitted to [facility] on 10/02/2015 for evaluation of generalized weakness, poor by mouth intake, and weight loss. The patient has extensive cancer history and was diagnosed with throat cancer back in July 2016. He is undergoing chemotherapy and radiation therapy for the same. He has experienced significant weight loss approximately 55 pounds since August. He was admitted for poor by mouth intake, weight loss and also found to have multifocal pneumonia. He has been treated with antibiotic therapy. We are asked to see him for hyponatremia. Upon presentation serum sodium was 135. However in the past he's had periods of hyponatremia. Serum sodium is currently 126. He is now on tube feedings through a PEG tube. He is currently receiving Osmolite tube feeds 240 cc 5 times per day.   Medications: Outpatient medications: Prescriptions prior to admission  Medication Sig Dispense Refill Last Dose  . acetaminophen (TYLENOL) 325 MG tablet Take 650 mg by mouth every 6 (six) hours as needed for mild pain.   10/02/2015 at 0700  . acidophilus (RISAQUAD) CAPS capsule Take 1 capsule by mouth at bedtime.    10/01/2015 at Unknown time  . Benzocaine (ANBESOL MT) 1 application by Other route as needed (for gum pain).   Past Week at Unknown time  . citalopram (CELEXA) 20 MG tablet Take 1 tablet (20 mg total) by mouth daily. 30 tablet 3 10/02/2015 at Unknown time  . fentaNYL  (DURAGESIC - DOSED MCG/HR) 50 MCG/HR Place 1 patch (50 mcg total) onto the skin every 3 (three) days. 10 patch 0 10/01/2015 at 1100  . lisinopril (PRINIVIL,ZESTRIL) 10 MG tablet Take 10 mg by mouth at bedtime.    10/01/2015 at Unknown time  . nystatin (MYCOSTATIN) 100000 UNIT/ML suspension Take 5 mLs by mouth as needed (for thrush).   PRN at PRN  . ondansetron (ZOFRAN) 4 MG tablet Take 1 tablet (4 mg total) by mouth every 4 (four) hours as needed for nausea or vomiting. 45 tablet 0 Past Month at Unknown time  . oxycodone (ROXICODONE) 30 MG immediate release tablet Take 30 mg by mouth See admin instructions. Pt is able to take up to seven times a day as needed for pain.   10/02/2015 at 1200  . pantoprazole (PROTONIX) 40 MG tablet Take 1 tablet (40 mg total) by mouth 2 (two) times daily. 60 tablet 3 10/02/2015 at Unknown time  . phenol (CHLORASEPTIC) 1.4 % LIQD Use as directed 1 spray in the mouth or throat as needed for throat irritation / pain. 177 mL 0 10/02/2015 at Unknown time  . pravastatin (PRAVACHOL) 40 MG tablet Take 40 mg by mouth at bedtime.    10/01/2015 at Unknown time  . [  EXPIRED] predniSONE (DELTASONE) 10 MG tablet Take 10-60 mg by mouth See admin instructions. Pt is to take six tablets on day 1, five tablets on day 2, four tablets on day 3, three tablets on day 4, two tablets on day 5, and one tablet on day 6.   10/02/2015 at Unknown time  . traZODone (DESYREL) 100 MG tablet Take 100 mg by mouth at bedtime.   10/01/2015 at Unknown time  . chlorhexidine (PERIDEX) 0.12 % solution 15 mLs by Mouth Rinse route 2 (two) times daily. (Patient not taking: Reported on 10/02/2015) 120 mL 0 Taking  . dexamethasone (DECADRON) 4 MG tablet Take 1 tablet (4 mg total) by mouth 2 (two) times daily with a meal. 1 tablet BID through 11/14.  Take 1 tablet daily 11/15-11/21.  Then take 1 tablet every other day until finished. (Patient not taking: Reported on 10/02/2015) 21 tablet 0 Taking  . lidocaine-prilocaine  (EMLA) cream Apply 1 application topically as needed. (Patient not taking: Reported on 10/02/2015) 30 g 3 Taking  . promethazine (PHENERGAN) 25 MG suppository Place 1 suppository (25 mg total) rectally every 8 (eight) hours as needed for nausea or vomiting. (Patient not taking: Reported on 10/02/2015) 12 each 0 Taking  . sucralfate (CARAFATE) 1 GM/10ML suspension Take 10 mLs (1 g total) by mouth 4 (four) times daily -  with meals and at bedtime. (Patient not taking: Reported on 10/02/2015) 420 mL 0 Taking    Current medications: Current Facility-Administered Medications  Medication Dose Route Frequency Provider Last Rate Last Dose  . acetaminophen (TYLENOL) suppository 650 mg  650 mg Rectal Q6H PRN Fritzi Mandes, MD      . benzocaine (ORAJEL) 10 % mucosal gel   Mouth/Throat TID PRN Fritzi Mandes, MD      . enoxaparin (LOVENOX) injection 40 mg  40 mg Subcutaneous Q24H Fritzi Mandes, MD   40 mg at 10/09/15 1936  . feeding supplement (OSMOLITE 1.5 CAL) liquid 240 mL  240 mL Per Tube 5 X Daily Fritzi Mandes, MD   240 mL at 10/10/15 0948  . fentaNYL (DURAGESIC - dosed mcg/hr) 50 mcg  50 mcg Transdermal Q72H Fritzi Mandes, MD   50 mcg at 10/08/15 1814  . insulin aspart (novoLOG) injection 0-9 Units  0-9 Units Subcutaneous 6 times per day Lenis Noon, [facility]   1 Units at 10/09/15 2336  . levofloxacin (LEVAQUIN) 25 MG/ML solution 750 mg  750 mg Per Tube Daily Fritzi Mandes, MD   750 mg at 10/10/15 0948  . morphine 4 MG/ML injection 3 mg  3 mg Intravenous Q4H PRN Fritzi Mandes, MD   3 mg at 10/10/15 0947  . nystatin (MYCOSTATIN) 100000 UNIT/ML suspension 500,000 Units  5 mL Oral QID Fritzi Mandes, MD   500,000 Units at 10/08/15 1740  . ondansetron (ZOFRAN) injection 4 mg  4 mg Intravenous Q6H PRN Fritzi Mandes, MD      . phenol (CHLORASEPTIC) mouth spray 1 spray  1 spray Mouth/Throat PRN Fritzi Mandes, MD   1 spray at 10/05/15 0051  . polyvinyl alcohol (LIQUIFILM TEARS) 1.4 % ophthalmic solution 1 drop  1 drop Both Eyes PRN Lance Coon, MD   1 drop at 10/06/15 2141  . senna-docusate (Senokot-S) tablet 1 tablet  1 tablet Per Tube BID Fritzi Mandes, MD   1 tablet at 10/10/15 0947  . sodium chloride 0.9 % injection 10 mL  10 mL Intravenous PRN Fritzi Mandes, MD   10 mL at 10/04/15 1207  Facility-Administered Medications Ordered in Other Encounters  Medication Dose Route Frequency Provider Last Rate Last Dose  . sodium chloride 0.9 % injection 10 mL  10 mL Intracatheter PRN Evlyn Kanner, NP   10 mL at 09/10/15 3532      Allergies: No Known Allergies    Past Medical History: Past Medical History  Diagnosis Date  . Hypertension   . Pneumonia     2004  . GERD (gastroesophageal reflux disease)   . Arthritis   . Anemia     blood transfusion 2012  . Bleeding ulcer   . Machinery accident     LEG INJURY  . Cancer [facility])     mouth 2006 then again 2016     Past Surgical History: Past Surgical History  Procedure Laterality Date  . Leg surgery    . Joint replacement      right hip  . Tongue biopsy    . Tongue surgery      multiple surgeries  . Direct laryngoscopy N/A 06/26/2015    Procedure: Laryngoscopy with tongue base biopsy ;  Surgeon: Beverly Gust, MD;  Location: [facility] ORS;  Service: ENT;  Laterality: N/A;  . Esophagogastroduodenoscopy N/A 07/06/2015    Procedure: ESOPHAGOGASTRODUODENOSCOPY (EGD) and percutaneous gastrostomy tyube placement;  Surgeon: Lollie Sails, MD;  Location: [facility] ENDOSCOPY;  Service: Endoscopy;  Laterality: N/A;  Residual need to be done in the afternoon on 07/06/2015. Combined procedure with Dr. Gustavo Lah and Dr Rayann Heman  . Peripheral vascular catheterization N/A 07/27/2015    Procedure: Glori Luis Cath Insertion;  Surgeon: Katha Cabal, MD;  Location: [place] CV LAB;  Service: Cardiovascular;  Laterality: N/A;  . Esophagogastroduodenoscopy (egd) with propofol N/A 08/07/2015    Procedure: ESOPHAGOGASTRODUODENOSCOPY (EGD) WITH PROPOFOL;  Surgeon: Lollie Sails, MD;  Location:  [facility] ENDOSCOPY;  Service: Endoscopy;  Laterality: N/A;  . Peg placement N/A 10/08/2015    Procedure: PERCUTANEOUS ENDOSCOPIC GASTROSTOMY (PEG) PLACEMENT;  Surgeon: Manya Silvas, MD;  Location: [facility] ENDOSCOPY;  Service: Endoscopy;  Laterality: N/A;     Family History: Family History  Problem Relation Age of Onset  . CAD Mother   . CAD Father      Social History: Social History   Social History  . Marital Status: Married    Spouse Name: N/A  . Number of Children: N/A  . Years of Education: N/A   Occupational History  . Not on file.   Social History Main Topics  . Smoking status: Former Smoker -- 1.00 packs/day for 0 years    Types: Cigarettes    Quit date: 09/21/2014  . Smokeless tobacco: Not on file  . Alcohol Use: 7.2 oz/week    12 Cans of beer per week     Comment: beer/wine every day  . Drug Use: No  . Sexual Activity: Not on file   Other Topics Concern  . Not on file   Social History Narrative     Review of Systems: Review of Systems  Constitutional: Positive for weight loss and malaise/fatigue. Negative for fever and chills.  HENT: Negative for hearing loss and nosebleeds.   Eyes: Negative for blurred vision and double vision.  Respiratory: Positive for cough. Negative for hemoptysis and shortness of breath.   Cardiovascular: Negative for chest pain, orthopnea and claudication.  Gastrointestinal: Positive for nausea. Negative for heartburn, vomiting and abdominal pain.  Genitourinary: Negative for dysuria and urgency.  Musculoskeletal: Positive for joint pain. Negative for myalgias and neck pain.  Skin: Negative  for itching and rash.  Neurological: Positive for weakness. Negative for dizziness and headaches.  Endo/Heme/Allergies: Negative for polydipsia. Does not bruise/bleed easily.  Psychiatric/Behavioral: Negative for depression. The patient is nervous/anxious.      Vital Signs: Blood pressure 97/65, pulse 84, temperature 97.4 F (36.3 C),  temperature source Oral, resp. rate 18, height '6\' 2"'$  (1.88 m), weight 48.127 kg (106 lb 1.6 oz), SpO2 99 %.  Weight trends: Filed Weights   10/08/15 1534 10/09/15 0431 10/10/15 0500  Weight: 48.988 kg (108 lb) 48.263 kg (106 lb 6.4 oz) 48.127 kg (106 lb 1.6 oz)    Physical Exam: General: Cachetic male no acute distress  Head: Normocephalic, atraumatic.  Eyes: Anicteric, EOMI  Nose: Mucous membranes moist, not inflammed, nonerythematous.  Oral cavity: Oral mucosa moist, no ulcerations noted  Neck: Supple, no JVD.  Lungs:  Normal respiratory effort. Basilar rales.  Heart: RRR. S1 and S2 normal without gallop, murmur, or rubs.  Abdomen:  BS normoactive. Soft, Nondistended, non-tender.  No masses or organomegaly.  Extremities: No pretibial edema.  Neurologic: A&O X3, Motor strength is 5/5 in the all 4 extremities  Skin: No visible rashes, scars.    Lab results: Basic Metabolic Panel:  Recent Labs Lab 10/05/15 0816 10/06/15 0728 10/07/15 0745 10/08/15 0351 10/09/15 0439 10/09/15 1014 10/10/15 0500  NA 135  --   --   --  126* 127* 126*  K 4.0 3.8 4.0 4.4 4.7  --  4.4  CL 106  --   --   --  94*  --  93*  CO2 24  --   --   --  28  --  28  GLUCOSE 227*  --   --   --  118*  --  114*  BUN 17  --   --   --  13  --  13  CREATININE 0.41*  --   --   --  0.33*  --  <0.30*  CALCIUM 8.0*  --   --   --  7.7*  --  7.6*  MG 1.8 1.5* 1.7 2.0 1.8  --  1.9  PHOS 2.2* 2.3* 3.0 3.3 3.7  --  3.8    Liver Function Tests:  Recent Labs Lab 10/05/15 0816  AST 20  ALT 11*  ALKPHOS 63  BILITOT 0.3  PROT 5.9*  ALBUMIN 1.7*   No results for input(s): LIPASE, AMYLASE in the last 168 hours. No results for input(s): AMMONIA in the last 168 hours.  CBC:  Recent Labs Lab 10/05/15 0816  WBC 6.3  NEUTROABS 5.4  HGB 10.8*  HCT 33.3*  MCV 94.3  PLT 222    Cardiac Enzymes: No results for input(s): CKTOTAL, CKMB, CKMBINDEX, TROPONINI in the last 168 hours.  BNP: Invalid input(s):  POCBNP  CBG:  Recent Labs Lab 10/09/15 1944 10/09/15 2330 10/10/15 0400 10/10/15 0429 10/10/15 0709  GLUCAP 118* 122* 66 125* 42    Microbiology: Results for orders placed or performed during the hospital encounter of 10/02/15  Culture, blood (routine x 2) Call MD if unable to obtain prior to antibiotics being given     Status: None   Collection Time: 10/02/15  5:34 PM  Result Value Ref Range Status   Specimen Description BLOOD LEFT ASSIST CONTROL  Final   Special Requests BOTTLES DRAWN AEROBIC AND ANAEROBIC 5CC  Final   Culture NO GROWTH 5 DAYS  Final   Report Status 10/07/2015 FINAL  Final  Culture, blood (routine x 2)  Call MD if unable to obtain prior to antibiotics being given     Status: None   Collection Time: 10/02/15  5:39 PM  Result Value Ref Range Status   Specimen Description BLOOD RIGHT PORTA CATH  Final   Special Requests BOTTLES DRAWN AEROBIC AND ANAEROBIC 5CC  Final   Culture NO GROWTH 5 DAYS  Final   Report Status 10/07/2015 FINAL  Final  Culture, sputum-assessment     Status: None   Collection Time: 10/02/15  7:29 PM  Result Value Ref Range Status   Specimen Description SPUTUM  Final   Special Requests NONE  Final   Sputum evaluation THIS SPECIMEN IS ACCEPTABLE FOR SPUTUM CULTURE  Final   Report Status 10/02/2015 FINAL  Final  Culture, respiratory (NON-Expectorated)     Status: None   Collection Time: 10/02/15  7:29 PM  Result Value Ref Range Status   Specimen Description SPUTUM  Final   Special Requests NONE Reflexed from [id number]  Final   Gram Stain   Final    MODERATE WBC SEEN FEW GRAM NEGATIVE RODS FEW GRAM POSITIVE COCCI GOOD SPECIMEN - 80-90% WBCS    Culture LIGHT GROWTH ESCHERICHIA COLI  Final   Report Status 10/05/2015 FINAL  Final   Organism ID, Bacteria ESCHERICHIA COLI  Final      Susceptibility   Escherichia coli - MIC*    AMPICILLIN <=2 SENSITIVE Sensitive     CEFAZOLIN <=4 SENSITIVE Sensitive     CEFTRIAXONE <=1 SENSITIVE Sensitive      CIPROFLOXACIN <=0.25 SENSITIVE Sensitive     GENTAMICIN <=1 SENSITIVE Sensitive     IMIPENEM <=0.25 SENSITIVE Sensitive     NITROFURANTOIN <=16 SENSITIVE Sensitive     TRIMETH/SULFA <=20 SENSITIVE Sensitive     PIP/TAZO Value in next row Sensitive      SENSITIVE<=4    * LIGHT GROWTH ESCHERICHIA COLI    Coagulation Studies: No results for input(s): LABPROT, INR in the last 72 hours.  Urinalysis: No results for input(s): COLORURINE, LABSPEC, PHURINE, GLUCOSEU, HGBUR, BILIRUBINUR, KETONESUR, PROTEINUR, UROBILINOGEN, NITRITE, LEUKOCYTESUR in the last 72 hours.  Invalid input(s): APPERANCEUR    Imaging:  No results found.   Assessment & Plan: Pt is a 59 y.o. male with a PMHx of hypertension, GERD, osteoarthritis, history of machinery accident resulting in leg injury, oral cancer, history of throat cancer, who was admitted to [facility] on 10/02/2015 for evaluation of generalized weakness, poor by mouth intake, and weight loss.   1.  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 2.  Weight loss. 3.  Throat cancer. 4.  Hypertension. 5.  Bacterial pneumonia.  Plan:  The patient will certainly does appear to have hyponatremia at this point in time. This is likely related to his underlying throat cancer as well as bacteria pneumonia both of which could potentially lead to SIADH. The patient is on obligatory fluid intake which he needs for his cachexia.  Therefore we will start the patient on salt tablets 1 g by mouth twice a day as well as Lasix 20 mg by mouth daily. We will also check serum osmolality, urine osmolality, TSH, cortisol, and uric acid level. We would target a serum sodium of 130 if possible. We will plan to follow this issue as an outpatient.

## 2015-10-10 NOTE — Plan of Care (Signed)
Problem: Spiritual Needs Goal: Ability to function at adequate level Outcome: Progressing Plan of care, progress to goals. 1. Started bolus feeds through peg, tolerating. 2. Increased pain with mild confusion, reoriented easily. 3. Looking forward to going home.  Has had a peg tube in the past and participates in peg care. 4. Pt remains safe and without injury this shift. 5. Complains of insomnia x 3 nights and given dose of IV Benadryl with relief this night. 6. Removed peripheral IV due to leakage.  Pt has port. Dorna Bloom RN

## 2015-10-10 NOTE — Care Management Important Message (Signed)
Important Message  Patient Details  Name: Grant Lee MRN: [id number] Date of Birth: 04-06-1956   Medicare Important Message Given:  Yes    [name] [name] 10/10/2015, 3:26 PM

## 2015-10-10 NOTE — Progress Notes (Signed)
BS at 4 AM was 66.  D50 25 g given IV push.  Recheck at 4:29 AM BS 125.  Pt asymptomatic. Remains NPO with bolus Osmolite through peg 5xday. Dorna Bloom RN

## 2015-10-10 NOTE — Progress Notes (Signed)
Pt complains of inability to sleep x 3 nights.  Spoke with Dr Jannifer Franklin and IV benadryl ordered. Dorna Bloom RN

## 2015-10-10 NOTE — Progress Notes (Signed)
Patient being discharged to [facility]. Report called to Nicole Kindred, RN. Notified EMS to transport.

## 2015-10-10 NOTE — NC FL2 (Signed)
[place] LEVEL OF CARE SCREENING TOOL     IDENTIFICATION  Patient Name: Grant Lee Birthdate: May 11, 1956 Sex: male Admission Date (Current Location): 10/02/2015  [place] and [place] Number: [facility] and Address:  [facility], [address] [place],  [address]      Provider Number: [id number]  Attending Physician Name and Address:  Fritzi Mandes, MD  Relative Name and Phone Number:       Current Level of Care: Hospital Recommended Level of Care: [place] Prior Approval Number:    Date Approved/Denied:   PASRR Number: [id number]  Discharge Plan: Home    Current Diagnoses: Patient Active Problem List   Diagnosis Date Noted  . Pneumonia 10/02/2015  . Malignant neoplasm of tongue ([place]) 08/30/2015  . PUD (peptic ulcer disease) 08/08/2015  . Acute esophagitis 08/08/2015  . Chronic pain syndrome 08/08/2015  . Esophagitis 08/08/2015  . Chemotherapy induced nausea and vomiting 08/03/2015  . N&V (nausea and vomiting) 08/03/2015  . Cancer of tongue ([place])   . Protein-calorie malnutrition, severe ([place]) 07/05/2015  . Severe protein-calorie malnutrition Altamease Oiler: less than 60% of standard weight) ([place]) 07/05/2015  . Malnutrition ([place]) 07/03/2015  . Aspiration pneumonitis ([place]) 07/03/2015  . Pneumonitis due to inhalation of food or vomitus ([place]) 07/03/2015  . DU (duodenal ulcer) 06/11/2015  . Tongue cancer ([place]) 06/11/2015  . Duodenal ulcer without hemorrhage or perforation 06/11/2015  . Abnormal loss of weight 10/09/2014  . Can't get food down 10/09/2014    Orientation ACTIVITIES/SOCIAL BLADDER RESPIRATION    Self, Time, Situation  Active Continent O2 (As needed) (5L)  BEHAVIORAL SYMPTOMS/MOOD NEUROLOGICAL BOWEL NUTRITION STATUS      Continent Feeding tube  PHYSICIAN VISITS COMMUNICATION OF NEEDS Height & Weight Skin  30 days Verbally '6\' 2"'$  (188 cm) 106 lbs. Normal           AMBULATORY STATUS RESPIRATION    Assist extensive O2 (As needed) (5L)      Personal Care Assistance Level of Assistance  Bathing, Feeding, Dressing Bathing Assistance: Limited assistance Feeding assistance: Limited assistance Dressing Assistance: Limited assistance      Functional Limitations [place]  PT (By licensed PT)                   Additional Factors Info  Code Status, Allergies Code Status Info: Full Code Allergies Info: No Known Allergies           Current Medications (10/10/2015):  This is the current hospital active medication list Current Facility-Administered Medications  Medication Dose Route Frequency Provider Last Rate Last Dose  . acetaminophen (TYLENOL) suppository 650 mg  650 mg Rectal Q6H PRN Fritzi Mandes, MD      . benzocaine (ORAJEL) 10 % mucosal gel   Mouth/Throat TID PRN Fritzi Mandes, MD      . enoxaparin (LOVENOX) injection 40 mg  40 mg Subcutaneous Q24H Fritzi Mandes, MD   40 mg at 10/09/15 1936  . feeding supplement (OSMOLITE 1.5 CAL) liquid 240 mL  240 mL Per Tube 5 X Daily Fritzi Mandes, MD      . fentaNYL ([place] - dosed mcg/hr) 50 mcg  50 mcg Transdermal Q72H Fritzi Mandes, MD   50 mcg at 10/08/15 1814  . insulin aspart (novoLOG) injection 0-9 Units  0-9 Units Subcutaneous 6 times per day Lenis Noon, [facility]  1 Units at 10/09/15 2336  . levofloxacin (LEVAQUIN) 25 MG/ML solution 750 mg  750 mg Per Tube Daily Fritzi Mandes, MD   750 mg at 10/09/15 1300  . morphine 4 MG/ML injection 3 mg  3 mg Intravenous Q4H PRN Fritzi Mandes, MD   3 mg at 10/10/15 0545  . nystatin (MYCOSTATIN) 100000 UNIT/ML suspension 500,000 Units  5 mL Oral QID Fritzi Mandes, MD   500,000 Units at 10/08/15 1740  . ondansetron (ZOFRAN) injection 4 mg  4 mg Intravenous Q6H PRN Fritzi Mandes, MD      . phenol (CHLORASEPTIC) mouth spray 1 spray  1 spray Mouth/Throat PRN Fritzi Mandes, MD   1 spray at 10/05/15 0051  . polyvinyl alcohol (LIQUIFILM  TEARS) 1.4 % ophthalmic solution 1 drop  1 drop Both Eyes PRN Lance Coon, MD   1 drop at 10/06/15 2141  . senna-docusate (Senokot-S) tablet 1 tablet  1 tablet Per Tube BID Fritzi Mandes, MD   1 tablet at 10/09/15 1937  . sodium chloride 0.9 % injection 10 mL  10 mL Intravenous PRN Fritzi Mandes, MD   10 mL at 10/04/15 1207   Facility-Administered Medications Ordered in Other Encounters  Medication Dose Route Frequency Provider Last Rate Last Dose  . sodium chloride 0.9 % injection 10 mL  10 mL Intracatheter PRN Evlyn Kanner, NP   10 mL at 09/10/15 5872     Discharge Medications: Please see discharge summary for a list of discharge medications.  Relevant Imaging Results:  Relevant Lab Results:  Recent Labs    Additional Information    Darden Dates, LCSW

## 2015-10-10 NOTE — Clinical Social Work Note (Signed)
PT is ready for discharge today. Pt's family chose [facility]. Facility has received all discharge information and is ready to admit pt. Pt's wife is going to sign pt in. Pt and wife are in agreement to discharge plan and is aware. RN to call report and EMS will provide transportation. CSW is signing off as no further needs identified.   Darden Dates, MSW, LCSW Clinical Social Worker  [phone]

## 2015-10-10 NOTE — Discharge Summary (Addendum)
[facility] NAME: Grant Lee    MR#:  [id number]  DATE OF BIRTH:  [birth year]  DATE OF ADMISSION:  10/02/2015 ADMITTING PHYSICIAN: Fritzi Mandes, MD  DATE OF DISCHARGE: 10/10/2015  PRIMARY CARE PHYSICIAN: Cyndi Bender, PA-C    ADMISSION DIAGNOSIS:  Dehydration [E86.0] Community acquired pneumonia [J18.9]  DISCHARGE DIAGNOSIS:  Aspiration Pneumonia H/o Squamous cell cancer (complted radiation and chemo recently) Odynophagia Severe Malnutrition s/p re-insertion of PEG tube  SECONDARY DIAGNOSIS:   Past Medical History  Diagnosis Date  . Hypertension   . Pneumonia     2004  . GERD (gastroesophageal reflux disease)   . Arthritis   . Anemia     blood transfusion 2012  . Bleeding ulcer   . Machinery accident     LEG INJURY  . Cancer [facility])     mouth 2006 then again 2016    HOSPITAL COURSE:   59 y.o. 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  1. Multifocal pneumonia likely aspiration pneumonia given issues with swallowing liquid Levaquin qd for 3 more days Blood cultures negative  Breathing treatment as needed Remains afebrile  2. Odynophagia Status post radiation therapy completed 10 days ago for throat/tongue cancer -chlorSeptic spray,Nystatin swish and swallow -Speech therapy evaluation noted. Recommends to stay NPO since pt is aspirating all kinds of food. -PEG tube placed on 10/08/15 by Dr Candace Cruise  3. Cachexia and malnutrition Dietitian for TPN started on 10/04/2015  PEG feeding initiated Asymptomatic episode of hypoglycemia. Spoke with Dietitian and will increased PEG  feeding. -cont Blood sugar monitoring AC and HS at rehab   4.hyponatremia 135--138--126--127--126 -suspect could be due to TPN /volume -pt asymptomatic -Rest electrolytes ok -spoke with Dr Holley Raring to see pt. Consider salt tabs temporarily -BMP to be monitored closely at the Rehab facility -will try to minimize free water flushes thru PEG  5. Lovenox for DVT prophylaxis  PT recommends rehab  CSW for d/c planning.  Will d/c later today after seen by nephrology CONSULTS OBTAINED:     DRUG ALLERGIES:  No Known Allergies  DISCHARGE MEDICATIONS:   Current Discharge Medication List    START taking these medications   Details  levofloxacin (LEVAQUIN) 25 MG/ML solution Place 30 mLs (750 mg total) into feeding tube daily. Qty: 100 mL, Refills: 0    morphine 20 MG/5ML solution Take 1.3 mLs (5.2 mg total) by mouth every 6 (six) hours as needed for pain. Qty: 100 mL, Refills: 0    Nutritional Supplements (FEEDING SUPPLEMENT, OSMOLITE 1.5 CAL,) LIQD Place 240 mLs into feeding tube 5 (five) times daily. Qty: 150 Bottle, Refills: 3    polyvinyl alcohol (LIQUIFILM TEARS) 1.4 % ophthalmic solution Place 1 drop into both eyes as needed for dry eyes. Qty: 15 mL, Refills: 0      CONTINUE these medications which have NOT CHANGED   Details  acetaminophen (TYLENOL) 325 MG tablet Take 650 mg by mouth every 6 (six) hours as needed for mild pain.    acidophilus (RISAQUAD) CAPS capsule Take 1 capsule by mouth at bedtime.     Benzocaine (ANBESOL MT) 1 application by Other route as needed (for gum pain).    citalopram (  CELEXA) 20 MG tablet Take 1 tablet (20 mg total) by mouth daily. Qty: 30 tablet, Refills: 3   Associated Diagnoses: Tongue cancer (HCC)    fentaNYL (DURAGESIC - DOSED MCG/HR) 50 MCG/HR Place 1 patch (50 mcg total) onto the skin every 3 (three) days. Qty: 10 patch, Refills: 0   Associated Diagnoses: Tongue cancer (HCC)    lisinopril (PRINIVIL,ZESTRIL) 10 MG tablet Take 10  mg by mouth at bedtime.     nystatin (MYCOSTATIN) 100000 UNIT/ML suspension Take 5 mLs by mouth as needed (for thrush).    ondansetron (ZOFRAN) 4 MG tablet Take 1 tablet (4 mg total) by mouth every 4 (four) hours as needed for nausea or vomiting. Qty: 45 tablet, Refills: 0    oxycodone (ROXICODONE) 30 MG immediate release tablet Take 30 mg by mouth See admin instructions. Pt is able to take up to seven times a day as needed for pain.    pantoprazole (PROTONIX) 40 MG tablet Take 1 tablet (40 mg total) by mouth 2 (two) times daily. Qty: 60 tablet, Refills: 3   Associated Diagnoses: Cancer of tongue ([place]); Tongue cancer (HCC)    phenol (CHLORASEPTIC) 1.4 % LIQD Use as directed 1 spray in the mouth or throat as needed for throat irritation / pain. Qty: 177 mL, Refills: 0    pravastatin (PRAVACHOL) 40 MG tablet Take 40 mg by mouth at bedtime.     traZODone (DESYREL) 100 MG tablet Take 100 mg by mouth at bedtime.    chlorhexidine (PERIDEX) 0.12 % solution 15 mLs by Mouth Rinse route 2 (two) times daily. Qty: 120 mL, Refills: 0    dexamethasone (DECADRON) 4 MG tablet Take 1 tablet (4 mg total) by mouth 2 (two) times daily with a meal. 1 tablet BID through 11/14.  Take 1 tablet daily 11/15-11/21.  Then take 1 tablet every other day until finished. Qty: 21 tablet, Refills: 0    lidocaine-prilocaine (EMLA) cream Apply 1 application topically as needed. Qty: 30 g, Refills: 3   Associated Diagnoses: Tongue cancer (HCC)    promethazine (PHENERGAN) 25 MG suppository Place 1 suppository (25 mg total) rectally every 8 (eight) hours as needed for nausea or vomiting. Qty: 12 each, Refills: 0    sucralfate (CARAFATE) 1 GM/10ML suspension Take 10 mLs (1 g total) by mouth 4 (four) times daily -  with meals and at bedtime. Qty: 420 mL, Refills: 0      STOP taking these medications     predniSONE (DELTASONE) 10 MG tablet         If you experience worsening of your admission symptoms, develop  shortness of breath, life threatening emergency, suicidal or homicidal thoughts you must seek medical attention immediately by calling 911 or calling your MD immediately  if symptoms less severe.  You Must read complete instructions/literature along with all the possible adverse reactions/side effects for all the Medicines you take and that have been prescribed to you. Take any new Medicines after you have completely understood and accept all the possible adverse reactions/side effects.   Please note  You were cared for by a hospitalist during your hospital stay. If you have any questions about your discharge medications or the care you received while you were in the hospital after you are discharged, you can call the unit and asked to speak with the hospitalist on call if the hospitalist that took care of you is not available. Once you are discharged, your primary care physician will handle any further medical  issues. Please note that NO REFILLS for any discharge medications will be authorized once you are discharged, as it is imperative that you return to your primary care physician (or establish a relationship with a primary care physician if you do not have one) for your aftercare needs so that they can reassess your need for medications and monitor your lab values. Today   SUBJECTIVE   No complaints  VITAL SIGNS:  Blood pressure 97/65, pulse 84, temperature 97.4 F (36.3 C), temperature source Oral, resp. rate 18, height '6\' 2"'$  (1.88 m), weight 106 lb 1.6 oz (48.127 kg), SpO2 99 %.  I/O:   Intake/Output Summary (Last 24 hours) at 10/10/15 0833 Last data filed at 10/10/15 0556  Gross per 24 hour  Intake   1420 ml  Output    900 ml  Net    520 ml    PHYSICAL EXAMINATION:   GENERAL: 59 y.o.-year-old patient lying in the bed with no acute distress. Thin, cachectic EYES: Pupils equal, round, reactive to light and accommodation. No scleral icterus. Extraocular muscles intact.  HEENT:  Head atraumatic, normocephalic. Oropharynx and nasopharynx clear.  NECK: Supple, no jugular venous distention. No thyroid enlargement, no tenderness.  LUNGS: distant breath sounds bilaterally, no wheezing, rales, rhonchi. No use of accessory muscles of respiration. Coarse bs on right. Port + CARDIOVASCULAR: S1, S2 normal. No murmurs, rubs, or gallops.  ABDOMEN: Soft, nontender, nondistended. Bowel sounds present. No organomegaly or mass. PEG + EXTREMITIES: No cyanosis, clubbing or edema b/l.  NEUROLOGIC: Cranial nerves II through XII are intact. No focal Motor or sensory deficits b/l.  PSYCHIATRIC: The patient is alert and oriented x 3.  SKIN: No obvious rash, lesion, or ulcer.  DATA REVIEW:   CBC   Recent Labs Lab 10/05/15 0816  WBC 6.3  HGB 10.8*  HCT 33.3*  PLT 222    Chemistries   Recent Labs Lab 10/05/15 0816  10/10/15 0500  NA 135  < > 126*  K 4.0  < > 4.4  CL 106  < > 93*  CO2 24  < > 28  GLUCOSE 227*  < > 114*  BUN 17  < > 13  CREATININE 0.41*  < > <0.30*  CALCIUM 8.0*  < > 7.6*  MG 1.8  < > 1.9  AST 20  --   --   ALT 11*  --   --   ALKPHOS 63  --   --   BILITOT 0.3  --   --   < > = values in this interval not displayed.  Microbiology Results   Recent Results (from the past 240 hour(s))  Culture, blood (routine x 2) Call MD if unable to obtain prior to antibiotics being given     Status: None   Collection Time: 10/02/15  5:34 PM  Result Value Ref Range Status   Specimen Description BLOOD LEFT ASSIST CONTROL  Final   Special Requests BOTTLES DRAWN AEROBIC AND ANAEROBIC 5CC  Final   Culture NO GROWTH 5 DAYS  Final   Report Status 10/07/2015 FINAL  Final  Culture, blood (routine x 2) Call MD if unable to obtain prior to antibiotics being given     Status: None   Collection Time: 10/02/15  5:39 PM  Result Value Ref Range Status   Specimen Description BLOOD RIGHT PORTA CATH  Final   Special Requests BOTTLES DRAWN AEROBIC AND ANAEROBIC 5CC  Final    Culture NO GROWTH 5 DAYS  Final  Report Status 10/07/2015 FINAL  Final  Culture, sputum-assessment     Status: None   Collection Time: 10/02/15  7:29 PM  Result Value Ref Range Status   Specimen Description SPUTUM  Final   Special Requests NONE  Final   Sputum evaluation THIS SPECIMEN IS ACCEPTABLE FOR SPUTUM CULTURE  Final   Report Status 10/02/2015 FINAL  Final  Culture, respiratory (NON-Expectorated)     Status: None   Collection Time: 10/02/15  7:29 PM  Result Value Ref Range Status   Specimen Description SPUTUM  Final   Special Requests NONE Reflexed from [id number]  Final   Gram Stain   Final    MODERATE WBC SEEN FEW GRAM NEGATIVE RODS FEW GRAM POSITIVE COCCI GOOD SPECIMEN - 80-90% WBCS    Culture LIGHT GROWTH ESCHERICHIA COLI  Final   Report Status 10/05/2015 FINAL  Final   Organism ID, Bacteria ESCHERICHIA COLI  Final      Susceptibility   Escherichia coli - MIC*    AMPICILLIN <=2 SENSITIVE Sensitive     CEFAZOLIN <=4 SENSITIVE Sensitive     CEFTRIAXONE <=1 SENSITIVE Sensitive     CIPROFLOXACIN <=0.25 SENSITIVE Sensitive     GENTAMICIN <=1 SENSITIVE Sensitive     IMIPENEM <=0.25 SENSITIVE Sensitive     NITROFURANTOIN <=16 SENSITIVE Sensitive     TRIMETH/SULFA <=20 SENSITIVE Sensitive     PIP/TAZO Value in next row Sensitive      SENSITIVE<=4    * LIGHT GROWTH ESCHERICHIA COLI    RADIOLOGY:  No results found.   Management plans discussed with the patient, family and they are in agreement.  CODE STATUS:     Code Status Orders        Start     Ordered   10/02/15 1734  Full code   Continuous     10/02/15 1734    Advance Directive Documentation        Most Recent Value   Type of Advance Directive  Healthcare Power of Attorney   Pre-existing out of facility DNR order (yellow form or pink MOST form)     "MOST" Form in Place?        TOTAL TIME TAKING CARE OF THIS PATIENT: 40 minutes.    Markisha Meding M.D on 10/10/2015 at 8:33 AM  Between 7am to 6pm -  Pager - [phone] After 6pm go to [email or web address] - password [email or web address] [facility]  Office  [phone]  CC: Primary care physician; Cyndi Bender, [name]

## 2015-10-10 NOTE — Discharge Instructions (Signed)
PEG tube care per protocol Speech Therapy and Dietitian to follow at rehab PT Check sugars ac/hs

## 2015-10-11 ENCOUNTER — Inpatient Hospital Stay: Payer: Medicare Other

## 2015-10-11 ENCOUNTER — Ambulatory Visit: Payer: Medicare Other | Admitting: Oncology

## 2015-10-11 ENCOUNTER — Inpatient Hospital Stay: Payer: Medicare Other | Admitting: Oncology

## 2015-10-11 ENCOUNTER — Non-Acute Institutional Stay (SKILLED_NURSING_FACILITY): Payer: Medicare Other | Admitting: Internal Medicine

## 2015-10-11 ENCOUNTER — Other Ambulatory Visit: Payer: Medicare Other

## 2015-10-11 DIAGNOSIS — R1310 Dysphagia, unspecified: Secondary | ICD-10-CM | POA: Diagnosis not present

## 2015-10-11 DIAGNOSIS — F329 Major depressive disorder, single episode, unspecified: Secondary | ICD-10-CM | POA: Diagnosis not present

## 2015-10-11 DIAGNOSIS — R531 Weakness: Secondary | ICD-10-CM | POA: Diagnosis not present

## 2015-10-11 DIAGNOSIS — C029 Malignant neoplasm of tongue, unspecified: Secondary | ICD-10-CM

## 2015-10-11 DIAGNOSIS — E785 Hyperlipidemia, unspecified: Secondary | ICD-10-CM | POA: Diagnosis not present

## 2015-10-11 DIAGNOSIS — I952 Hypotension due to drugs: Secondary | ICD-10-CM

## 2015-10-11 DIAGNOSIS — E871 Hypo-osmolality and hyponatremia: Secondary | ICD-10-CM

## 2015-10-11 DIAGNOSIS — K209 Esophagitis, unspecified without bleeding: Secondary | ICD-10-CM

## 2015-10-11 DIAGNOSIS — E43 Unspecified severe protein-calorie malnutrition: Secondary | ICD-10-CM | POA: Diagnosis not present

## 2015-10-11 DIAGNOSIS — G4700 Insomnia, unspecified: Secondary | ICD-10-CM | POA: Diagnosis not present

## 2015-10-11 DIAGNOSIS — J690 Pneumonitis due to inhalation of food and vomit: Secondary | ICD-10-CM | POA: Diagnosis not present

## 2015-10-11 NOTE — Progress Notes (Signed)
Patient ID: Grant Lee, male   DOB: 1956/08/06, 59 y.o.   MRN: [id number]     Facility: [facility]    PCP: Fae Pippin  Code Status: full code  No Known Allergies  Chief Complaint  Patient presents with  . New Admit To SNF     HPI:  59 y.o. patient is here for short term rehabilitation post hospital admission from 10/02/15-10/10/15 with odynophagia, dehydration and pneumonia. He was started on antibiotics. He was treated with supportive care for his odynophagia and SLP was consulted. His peg tube had fallen off and was replaced on 10/08/15. He is NPO and on TPN at present. He was able to tolerate po feed prior to this hospitalization per his daughter who is at his bedside today. His ex wife is also present in his room. 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 He mentions feeling better in terms of his energy today and feels his energy to be returning slowly. He took a pain medication by mouth today and mentions tolerating it well. He denies any pain or soreness to his mouth today. He has been taking few ice chips by mouth. Daughter is concerned about sugar check for his as this was done in the hospital and would like it done here if necessary. She also would like to know if he will need salt tablet.   Review of Systems:  Constitutional: Negative for fever, chills, diaphoresis.  HENT: has frontal headache and feels his nares to be dry. Negative for congestion, nasal discharge, sore throat. Eyes: Negative for eye pain, blurred vision, double vision and discharge.  Respiratory: positive for cough, shortness of breath with exertion. Currently on o2 . Denies wheezing.   Cardiovascular: Negative for chest pain, palpitations, leg swelling.  Gastrointestinal: Negative for heartburn, nausea, vomiting, abdominal pain. Had bowel movement  2 days back Genitourinary: Negative for dysuria, flank pain.  Musculoskeletal: Negative for back pain, falls in the facility Skin: Negative for itching, rash.  Neurological: positive for some dizziness with change in position. Negative for tingling, focal weakness Psychiatric/Behavioral: Negative for depression.    Past Medical History  Diagnosis Date  . Hypertension   . Pneumonia     2004  . GERD (gastroesophageal reflux disease)   . Arthritis   . Anemia     blood transfusion 2012  . Bleeding ulcer   . Machinery accident     LEG INJURY  . Cancer [facility])     mouth 2006 then again 2016   Past Surgical History  Procedure Laterality Date  . Leg surgery    . Joint replacement      right hip  . Tongue biopsy    . Tongue surgery      multiple surgeries  . Direct laryngoscopy N/A 06/26/2015    Procedure: Laryngoscopy with tongue base biopsy ;  Surgeon: Beverly Gust, MD;  Location: [facility] ORS;  Service: ENT;  Laterality: N/A;  . Esophagogastroduodenoscopy N/A 07/06/2015    Procedure: ESOPHAGOGASTRODUODENOSCOPY (EGD) and percutaneous gastrostomy tyube placement;  Surgeon: Lollie Sails, MD;  Location: [facility] ENDOSCOPY;  Service: Endoscopy;  Laterality: N/A;  Residual need to be done in the afternoon on 07/06/2015. Combined procedure with Dr. Gustavo Lah and Dr Rayann Heman  . Peripheral vascular catheterization N/A 07/27/2015    Procedure: Glori Luis Cath Insertion;  Surgeon: Katha Cabal, MD;  Location: [place] CV LAB;  Service: Cardiovascular;  Laterality: N/A;  . Esophagogastroduodenoscopy (egd) with propofol N/A 08/07/2015    Procedure: ESOPHAGOGASTRODUODENOSCOPY (EGD) WITH PROPOFOL;  Surgeon: Lollie Sails, MD;  Location: [facility] ENDOSCOPY;  Service: Endoscopy;  Laterality: N/A;  . Peg placement N/A 10/08/2015    Procedure: PERCUTANEOUS ENDOSCOPIC GASTROSTOMY (PEG) PLACEMENT;  Surgeon: Manya Silvas, MD;  Location: [facility] ENDOSCOPY;  Service: Endoscopy;  Laterality: N/A;   Social  History:   reports that he quit smoking about 12 months ago. His smoking use included Cigarettes. He smoked 1.00 pack per day for 0 years. He does not have any smokeless tobacco history on file. He reports that he drinks about 7.2 oz of alcohol per week. He reports that he does not use illicit drugs.  Family History  Problem Relation Age of Onset  . CAD Mother   . CAD Father     Medications:   Medication List       This list is accurate as of: 10/11/15 10:48 AM.  Always use your most recent med list.               acetaminophen 325 MG tablet  Commonly known as:  TYLENOL  Take 650 mg by mouth every 6 (six) hours as needed for mild pain.     acidophilus Caps capsule  Take 1 capsule by mouth at bedtime.     ANBESOL MT  1 application by Other route as needed (for gum pain).     chlorhexidine 0.12 % solution  Commonly known as:  PERIDEX  15 mLs by Mouth Rinse route 2 (two) times daily.     citalopram 20 MG tablet  Commonly known as:  CELEXA  Take 1 tablet (20 mg total) by mouth daily.     dexamethasone 4 MG tablet  Commonly known as:  DECADRON  Take 1 tablet (4 mg total) by mouth 2 (two) times daily with a meal. 1 tablet BID through 11/14.  Take 1 tablet daily 11/15-11/21.  Then take 1 tablet every other day until finished.     feeding supplement (OSMOLITE 1.5 CAL) Liqd  Place 240 mLs into feeding tube 5 (five) times daily.     fentaNYL 50 MCG/HR  Commonly known as:  DURAGESIC - dosed mcg/hr  Place 1 patch (50 mcg total) onto the skin every 3 (three) days.     levofloxacin 25 MG/ML solution  Commonly known as:  LEVAQUIN  Place 30 mLs (750 mg total) into feeding tube daily.     lidocaine-prilocaine cream  Commonly known as:  EMLA  Apply 1 application topically as needed.     lisinopril 10 MG tablet  Commonly known as:  PRINIVIL,ZESTRIL  Take 10 mg by mouth at bedtime.     morphine 20 MG/5ML solution  Take 1.3 mLs (5.2 mg total) by mouth every 6 (six) hours as  needed for pain.     nystatin 100000 UNIT/ML suspension  Commonly known as:  MYCOSTATIN  Take 5 mLs by mouth as needed (for thrush).     ondansetron 4 MG tablet  Commonly known as:  ZOFRAN  Take 1 tablet (4 mg total) by mouth every 4 (four) hours as needed for nausea or vomiting.     oxycodone 30 MG immediate release tablet  Commonly known as:  ROXICODONE  Take 30 mg by mouth See admin instructions. Pt is able to take up to seven times a day as needed for pain.     pantoprazole 40 MG tablet  Commonly known as:  PROTONIX  Take 1 tablet (40 mg total) by mouth 2 (two) times daily.     phenol 1.4 % Liqd  Commonly known as:  CHLORASEPTIC  Use as directed 1 spray in the mouth or throat as needed for throat irritation / pain.     polyvinyl alcohol 1.4 % ophthalmic solution  Commonly known as:  LIQUIFILM TEARS  Place 1 drop into both eyes as needed for dry eyes.     pravastatin 40 MG tablet  Commonly known as:  PRAVACHOL  Take 40 mg by mouth at bedtime.     promethazine 25 MG suppository  Commonly known as:  PHENERGAN  Place 1 suppository (25 mg total) rectally every 8 (eight) hours as needed for nausea or vomiting.     sucralfate 1 GM/10ML suspension  Commonly known as:  CARAFATE  Take 10 mLs (1 g total) by mouth 4 (four) times daily -  with meals and at bedtime.     traZODone 100 MG tablet  Commonly known as:  DESYREL  Take 100 mg by mouth at bedtime.         Physical Exam: Filed Vitals:   10/11/15 1045  BP: 93/66  Pulse: 87  Temp: 97 F (36.1 C)  Resp: 18  SpO2: 96%    General- elderly frail male, thin built, in no acute distress Head- normocephalic, atraumatic Nose- normal nasal mucosa, no maxillary or frontal sinus tenderness, no nasal discharge Throat- moist mucus membrane, no mucositis noted, no oral thrush Eyes- PERRLA, EOMI, no pallor, no icterus, no discharge, normal conjunctiva, normal sclera Neck- no cervical lymphadenopathy, no thyromegaly, no  jugular vein distension Cardiovascular- normal s1,s2, no murmurs, palpable dorsalis pedis and radial pulses, no leg edema Respiratory- bilateral poor air entry, no wheeze, no rhonchi, no crackles, no use of accessory muscles, on o2 Abdomen- bowel sounds present, soft, non tender, peg tube site clean Musculoskeletal- able to move all 4 extremities, generalized weakness more prominent in lower extremities Neurological- no focal deficit, alert and oriented to person, place and time Skin- warm and dry Psychiatry- normal mood and affect    Labs reviewed: Basic Metabolic Panel:  Recent Labs  10/05/15 0816  10/08/15 0351 10/09/15 0439 10/09/15 1014 10/10/15 0500  NA 135  --   --  126* 127* 126*  K 4.0  < > 4.4 4.7  --  4.4  CL 106  --   --  94*  --  93*  CO2 24  --   --  28  --  28  GLUCOSE 227*  --   --  118*  --  114*  BUN 17  --   --  13  --  13  CREATININE 0.41*  --   --  0.33*  --  <0.30*  CALCIUM 8.0*  --   --  7.7*  --  7.6*  MG 1.8  < > 2.0 1.8  --  1.9  PHOS 2.2*  < > 3.3 3.7  --  3.8  < > = values in this interval not displayed. Liver Function Tests:  Recent Labs  09/27/15 0948 10/02/15 1140 10/05/15 0816  AST 18 13* 20  ALT 12* 10* 11*  ALKPHOS 52 61 63  BILITOT 0.5 0.5 0.3  PROT 6.3* 6.0* 5.9*  ALBUMIN 2.9* 2.1* 1.7*    Recent Labs  08/03/15 1130  LIPASE 126*   No results for input(s): AMMONIA in the last 8760 hours. CBC:  Recent Labs  09/27/15 0948 10/02/15 1140  10/05/15 0816  WBC 7.4 13.8* 6.3  NEUTROABS 4.3 12.0* 5.4  HGB 11.8* 10.9* 10.8*  HCT 35.1* 33.6* 33.3*  MCV 93.3 95.0 94.3  PLT 307 277 222   Cardiac Enzymes:  Recent Labs  10/02/15 1140  TROPONINI 0.10*   BNP: Invalid input(s): POCBNP CBG:  Recent Labs  10/10/15 0709 10/10/15 1056 10/10/15 1606  GLUCAP 99 103* 114*   Lab Results  Component Value Date   HGBA1C 5.3 10/06/2015    Radiological Exams: Dg Chest 1 View  10/02/2015  CLINICAL DATA:  History throat  carcinoma with recent chemo 10 days ago and increasing weakness EXAM: CHEST  1 VIEW COMPARISON:  08/03/2015 FINDINGS: Cardiac shadow is within normal limits. A right chest wall port is again seen. Bibasilar infiltrates worse on the right than the left are now seen new from the prior exam. IMPRESSION: Bibasilar infiltrates right worse than left. Followup PA and lateral chest X-ray is recommended in 3-4 weeks following trial of antibiotic therapy to ensure resolution and exclude underlying malignancy. Electronically Signed   By: Inez Catalina M.D.   On: 10/02/2015 12:03   Ct Angio Chest Pe W/cm &/or Wo Cm  10/02/2015  CLINICAL DATA:  Remote history of mouth cancer. Current throat cancer. Chemotherapy 10 days ago. Recent radiation. Weakness, hemoptysis today. EXAM: CT ANGIOGRAPHY CHEST WITH CONTRAST TECHNIQUE: Multidetector CT imaging of the chest was performed using the standard protocol during bolus administration of intravenous contrast. Multiplanar CT image reconstructions and MIPs were obtained to evaluate the vascular anatomy. CONTRAST:  90m OMNIPAQUE IOHEXOL 350 MG/ML SOLN COMPARISON:  Chest x-ray earlier today.  Chest CT 03/23/2014 FINDINGS: No filling defects in the pulmonary arteries to suggest pulmonary emboli. Emphysematous changes in the lungs. Bilateral lower lobe airspace opacities as well as airspace opacity in the right middle lobe. There are innumerable nodular opacities in these areas. This most likely reflects pneumonia, but warrants follow-up after treatment. Mucous plugging in the lower lobe airways bilaterally. Heart is normal size. Coronary artery calcifications noted. Aorta normal caliber with scattered calcifications. Mildly prominent mediastinal lymph nodes. Subcarinal lymph node has a short axis diameter of 11 mm. Right paratracheal lymph node has a short axis diameter of 10 mm. Mildly prominent AP window lymph nodes. Chest wall soft tissues are unremarkable. Imaging into the upper  abdomen shows no acute findings. No acute bony abnormality. Review of the MIP images confirms the above findings. IMPRESSION: No evidence of pulmonary embolus. Extensive and airspace disease in the lower lobes bilaterally and right middle lobe with a nodular appearance. Findings most likely reflect multifocal pneumonia. Airway mucous plugging noted in the lower lobes bilaterally. Recommend follow-up after treatment to assure resolution. Mildly prominent mediastinal lymph nodes, likely reactive. Recommend attention on followup imaging. Coronary artery disease. Electronically Signed   By: KRolm BaptiseM.D.   On: 10/02/2015 15:29     Assessment/Plan  Generalized weakness With his deconditioning and malnutrition and recent PNA. Will have him work with physical therapy and occupational therapy team to help with gait training and muscle strengthening exercises.fall precautions. Skin care. Encourage to be out of bed.   Pneumonia Continue and complete his levaquin on 10/13/15. Aspiration precautions. Monitor cbc with diff. Wean off o2 as tolerated  tonguet cancer S/p chemo and radiation a week prior to hospitalization. Currently on Morphine 20 mg/550m5.2 mg q6h prn pain, Fentanyl patch 50 mcg/hr q3 days, Oxycodone 30 mg qd prn and q3h prn. Discontinue qd prn order of oxycodone. Monitor for  sedation. Has f/u with oncology in 10/24/15. Will need to wean off pain regimen as tolerated. Currently all medication to be given via PEG tube only, discussed with nursing.  Odynophagia Improved. Continue anbesol gel to gums bid as needed. Discontinue nystatin mouth wash for now. Continue cholraseptic spray prn. Continue oral hygiene with peridex. Continue osmolite for feeding. Continue free water flushes. On prn zofran and phenergan for nausea  Hyponatremia Check BMP in am and if na level remains low, add salt tablet 1 g bid and monitor  Severe protein calorie malnutrition Monitor weight. Get SLP consult to assess  for po feed.   Hypotension Currently on Lisinopril 10 mg daily, discontinue this and monitor bp q shift. If BP remains low, consider spacing out his pain medication  esophagitis Continue carafate 1g qid and protonix 40 mg bid and monitor symptoms, currently controlled.  Chronic depression Continue celexa 20 mg daily  Hyperlipidemia Continue pravachol 40 mg daily  insomnia Continue Trazodone 100 mg qhs   Goals of care: short term rehabilitation   Labs/tests ordered: cbc with diff, cmp  Family/ staff Communication: reviewed care plan with patient and nursing supervisor. Discussed that his sugar reading in hospital looked good and reviewed a1c. Will not need cbg check at present on routine basis.     Blanchie Serve, MD  [facility] [phone] (Monday-Friday 8 am - 5 pm) [phone] (afterhours)

## 2015-10-15 ENCOUNTER — Other Ambulatory Visit: Payer: Self-pay | Admitting: *Deleted

## 2015-10-15 DIAGNOSIS — C029 Malignant neoplasm of tongue, unspecified: Secondary | ICD-10-CM

## 2015-10-15 MED ORDER — FENTANYL 50 MCG/HR TD PT72: 50.0000 ug | MEDICATED_PATCH | TRANSDERMAL | Status: DC

## 2015-10-15 NOTE — Telephone Encounter (Signed)
[facility] 

## 2015-10-16 ENCOUNTER — Other Ambulatory Visit: Payer: Self-pay | Admitting: *Deleted

## 2015-10-16 MED ORDER — OXYCODONE HCL 30 MG PO TABS: ORAL_TABLET | ORAL | Status: DC

## 2015-10-16 NOTE — Telephone Encounter (Signed)
[facility] 

## 2015-10-18 ENCOUNTER — Telehealth: Payer: Self-pay | Admitting: *Deleted

## 2015-10-18 NOTE — Telephone Encounter (Signed)
Pt's wife came by clinic to request refill for oxycodone. Per pt's records oxycodone #210 was prescribed on 12/6 by Sherrie Mustache, NP. Per Dr. Oliva Bustard, we will not refill any narcotics at this time. Pt will need to contact PCP for further refills of narcotics.

## 2015-10-24 ENCOUNTER — Ambulatory Visit
Admission: RE | Admit: 2015-10-24 | Discharge: 2015-10-24 | Disposition: A | Discharge status: Home / Self Care | Payer: Medicare Other | Source: Ambulatory Visit | Attending: Radiation Oncology | Admitting: Radiation Oncology

## 2015-10-24 ENCOUNTER — Inpatient Hospital Stay: Payer: Medicare Other

## 2015-10-24 ENCOUNTER — Encounter: Payer: Self-pay | Admitting: Oncology

## 2015-10-24 ENCOUNTER — Inpatient Hospital Stay: Payer: Medicare Other | Attending: Oncology | Admitting: Oncology

## 2015-10-24 ENCOUNTER — Other Ambulatory Visit: Payer: Medicare Other

## 2015-10-24 DIAGNOSIS — Z87891 Personal history of nicotine dependence: Secondary | ICD-10-CM | POA: Diagnosis not present

## 2015-10-24 DIAGNOSIS — C029 Malignant neoplasm of tongue, unspecified: Secondary | ICD-10-CM

## 2015-10-24 DIAGNOSIS — R42 Dizziness and giddiness: Secondary | ICD-10-CM | POA: Insufficient documentation

## 2015-10-24 DIAGNOSIS — Z931 Gastrostomy status: Secondary | ICD-10-CM | POA: Insufficient documentation

## 2015-10-24 DIAGNOSIS — Z79899 Other long term (current) drug therapy: Secondary | ICD-10-CM

## 2015-10-24 DIAGNOSIS — Z85810 Personal history of malignant neoplasm of tongue: Secondary | ICD-10-CM | POA: Diagnosis not present

## 2015-10-24 DIAGNOSIS — C801 Malignant (primary) neoplasm, unspecified: Secondary | ICD-10-CM

## 2015-10-24 DIAGNOSIS — Z923 Personal history of irradiation: Secondary | ICD-10-CM | POA: Insufficient documentation

## 2015-10-24 DIAGNOSIS — J90 Pleural effusion, not elsewhere classified: Secondary | ICD-10-CM | POA: Insufficient documentation

## 2015-10-24 DIAGNOSIS — Z9221 Personal history of antineoplastic chemotherapy: Secondary | ICD-10-CM | POA: Insufficient documentation

## 2015-10-24 DIAGNOSIS — K219 Gastro-esophageal reflux disease without esophagitis: Secondary | ICD-10-CM | POA: Insufficient documentation

## 2015-10-24 DIAGNOSIS — I10 Essential (primary) hypertension: Secondary | ICD-10-CM | POA: Insufficient documentation

## 2015-10-24 DIAGNOSIS — C01 Malignant neoplasm of base of tongue: Secondary | ICD-10-CM | POA: Insufficient documentation

## 2015-10-24 DIAGNOSIS — F1120 Opioid dependence, uncomplicated: Secondary | ICD-10-CM | POA: Insufficient documentation

## 2015-10-24 DIAGNOSIS — D649 Anemia, unspecified: Secondary | ICD-10-CM | POA: Insufficient documentation

## 2015-10-24 DIAGNOSIS — E7800 Pure hypercholesterolemia, unspecified: Secondary | ICD-10-CM | POA: Diagnosis not present

## 2015-10-24 DIAGNOSIS — D72829 Elevated white blood cell count, unspecified: Secondary | ICD-10-CM | POA: Diagnosis not present

## 2015-10-24 DIAGNOSIS — F1010 Alcohol abuse, uncomplicated: Secondary | ICD-10-CM | POA: Diagnosis not present

## 2015-10-24 DIAGNOSIS — R1310 Dysphagia, unspecified: Secondary | ICD-10-CM | POA: Insufficient documentation

## 2015-10-24 LAB — COMPREHENSIVE METABOLIC PANEL
ALBUMIN: 1.4 g/dL — AB (ref 3.5–5.0)
ALK PHOS: 90 U/L (ref 38–126)
ALT: 35 U/L (ref 17–63)
AST: 55 U/L — ABNORMAL HIGH (ref 15–41)
Anion gap: 4 — ABNORMAL LOW (ref 5–15)
BUN: 12 mg/dL (ref 6–20)
CALCIUM: 7.6 mg/dL — AB (ref 8.9–10.3)
CO2: 28 mmol/L (ref 22–32)
CREATININE: 0.32 mg/dL — AB (ref 0.61–1.24)
Chloride: 95 mmol/L — ABNORMAL LOW (ref 101–111)
GFR calc Af Amer: >60 mL/min (ref >60)
GFR calc non Af Amer: >60 mL/min (ref >60)
GLUCOSE: 137 mg/dL — AB (ref 65–99)
Potassium: 3.9 mmol/L (ref 3.5–5.1)
SODIUM: 127 mmol/L — AB (ref 135–145)
Total Protein: 5.7 g/dL — ABNORMAL LOW (ref 6.5–8.1)

## 2015-10-24 LAB — CBC WITH DIFFERENTIAL/PLATELET
BASOS PCT: 1 %
Basophils Absolute: 0.1 10*3/uL (ref 0–0.1)
EOS ABS: 0 10*3/uL (ref 0–0.7)
Eosinophils Relative: 0 %
HEMATOCRIT: 24.9 % — AB (ref 40.0–52.0)
HEMOGLOBIN: 8.1 g/dL — AB (ref 13.0–18.0)
LYMPHS ABS: 3.1 10*3/uL (ref 1.0–3.6)
Lymphocytes Relative: 20 %
MCH: 28.1 pg (ref 26.0–34.0)
MCHC: 32.4 g/dL (ref 32.0–36.0)
MCV: 86.8 fL (ref 80.0–100.0)
Monocytes Absolute: 1.8 10*3/uL — ABNORMAL HIGH (ref 0.2–1.0)
Monocytes Relative: 12 %
NEUTROS ABS: 10.6 10*3/uL — AB (ref 1.4–6.5)
NEUTROS PCT: 67 %
Platelets: 659 10*3/uL — ABNORMAL HIGH (ref 150–440)
RBC: 2.87 MIL/uL — AB (ref 4.40–5.90)
RDW: 19.4 % — ABNORMAL HIGH (ref 11.5–14.5)
WBC: 15.6 10*3/uL — AB (ref 3.8–10.6)

## 2015-10-24 LAB — MAGNESIUM: Magnesium: 1.8 mg/dL (ref 1.7–2.4)

## 2015-10-24 MED ORDER — HEPARIN SOD (PORK) LOCK FLUSH 100 UNIT/ML IV SOLN
500.0000 [IU] | Freq: Once | INTRAVENOUS | Status: AC
  Administered 2015-10-24: 500 [IU] via INTRAVENOUS

## 2015-10-24 MED ORDER — SODIUM CHLORIDE 0.9 % IJ SOLN
10.0000 mL | INTRAMUSCULAR | Status: AC | PRN
  Administered 2015-10-24: 10 mL via INTRAVENOUS
  Filled 2015-10-24: qty 10

## 2015-10-24 MED ORDER — HEPARIN SOD (PORK) LOCK FLUSH 100 UNIT/ML IV SOLN
INTRAVENOUS | Status: AC
  Filled 2015-10-24: qty 5

## 2015-10-24 NOTE — Progress Notes (Signed)
Patient now at [facility] after hospitalization for pneumonia.  Patient states they are not giving him anything by mouth.  BP 94/61.  HR 86

## 2015-10-27 ENCOUNTER — Encounter: Payer: Self-pay | Admitting: Oncology

## 2015-10-27 NOTE — Progress Notes (Signed)
[place] @ [facility] [phone]  Fax:([phone]) [place] OB: 06-Nov-1956  MR#: [id number]  VQQ#:[id number]  Patient Care Team: Cyndi Bender, PA-C as PCP - General (Physician Assistant) Beverly Gust, MD (Unknown Physician Specialty)  CHIEF COMPLAINT:  Chief Complaint  Patient presents with  . Tongue Cancer   1.  Has a history of cancer of the tongue in 2006.  Patient underwent resection followed by radiation therapy 2.  Increasing difficulty in swallowing for last 2 or 3 weeks.  Patient had upper endoscopy done in December of 2015. 3.  Significant weight loss 4.  Abnormal PET scan (August of 2016) 5.  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  (Cis-platinum and radiation therapy) (September 2 016) T3 N0 M0 tumor Status post PEG tube placement (September, 2016) 6. Poor tolerance dose cis-platinum therapy, so patient has been switched over to cetuximab along with radiation therapy (October, 2016) 7.  Patient has finished cetuximab as well as radiation therapy by 23rd of November 2 016 8.  Patient was recently hospitalized with bilateral pneumonia, aspiration pneumonia and was transferred to rehabilitation (December, 2016)  VISIT DIAGNOSIS:  Significant weight loss. Difficulty swallowing. History of carcinoma of tongue    Oncology Flowsheet 09/24/2015 09/27/2015 10/02/2015 10/03/2015 10/04/2015 10/05/2015 10/09/2015  Day, Cycle - - - - - - -  cetuximab (ERBITUX) IV - - - - - - -  CISplatin (PLATINOL) IV - - - - - - -  dexamethasone (DECADRON) IJ - - - - - - -  dexamethasone (DECADRON) IV [ 4 mg ] 8 mg - - - - -  enoxaparin (LOVENOX) [place] - - 40 mg 40 mg 40 mg 40 mg 40 mg  fosaprepitant (EMEND) IV - - - - - - -  granisetron (KYTRIL) PO - - - - - - -  LORazepam (ATIVAN) IV - - - - 0.5 mg - -  methylPREDNISolone sodium succinate 40 mg/mL (SOLU-MEDROL) IV -  - - - 20 mg 10 mg -  metoCLOPramide (REGLAN) IV - - - - - - -  ondansetron (ZOFRAN) IV - - - - - - -  palonosetron (ALOXI) IV - - - - - - -  predniSONE (DELTASONE) PO - - - 30 mg - - -  promethazine (PHENERGAN) IM - - - - - - -  promethazine (PHENERGAN) IV - - - - - - -    INTERVAL HISTORY:  Patient was recently hospitalized with aspiration pneumonia.  Had hypoxia.  Was treated with antibiotics.  G-tube feeding was started patient is gaining weight.  Feels extremely weak and tired.  On oxygen.  In rehabilitation.  Continues to have chronic back pain for which patient is on oxycodone.  Declining performance status because of intercurrent infection REVIEW OF SYSTEMS:   Gen. status: Patient is feeling weak and tired.  Has lost significant weight Recently hospitalized with bilateral pneumonia Poor appetite patient is very depressed.  Losing weight. Increasing difficulty swallowing as mentioned in history of present illness HEENT: As mentioned in history of present illness patient had history of carcinoma of tongue status post radiation therapy recently having increasing soreness in the mouth. Lungs: Increasing cough shortness of breath yellowish expectoration no fever no hemoptysis GI: Persistent nausea as described above in history of present illness [place] close skeletal system no bony pain Lower extremity no swelling Skin: No rash Abdomen: Had a PEG tube placement  As per HPI. Otherwise, a complete review of systems is negatve.  PAST MEDICAL HISTORY: Carcinoma of tongue Hypertension Hypercholesterolemia Previous substance abuse Gastroesophageal reflux disease  PAST SURGICAL HISTORY: Treated for carcinoma of tongue FAMILY HISTORY There is no significant family history of breast cancer, ovarian cancer, colon cancer    ADVANCED DIRECTIVES:  Patient does not have any living will or healthcare power of attorney.  Information was given .  Available resources had been discussed.  We  will follow-up on subsequent appointments regarding this issue  HEALTH MAINTENANCE: Social History  Substance Use Topics  . Smoking status: Former Smoker -- 1.00 packs/day for 0 years    Types: Cigarettes    Quit date: 09/21/2014  . Smokeless tobacco: None  . Alcohol Use: 7.2 oz/week    12 Cans of beer per week     Comment: beer/wine every day    Quit smoking in November of 2015.  History of smoking for several years in the past.  No Known Allergies  Current Outpatient Prescriptions  Medication Sig Dispense Refill  . acidophilus (RISAQUAD) CAPS capsule Take 1 capsule by mouth at bedtime.     . Benzocaine (ANBESOL MT) 1 application by Other route as needed (for gum pain).    . chlorhexidine (PERIDEX) 0.12 % solution 15 mLs by Mouth Rinse route 2 (two) times daily. 120 mL 0  . citalopram (CELEXA) 20 MG tablet Take 1 tablet (20 mg total) by mouth daily. 30 tablet 3  . fentaNYL (DURAGESIC - DOSED MCG/HR) 50 MCG/HR Place 1 patch (50 mcg total) onto the skin every 3 (three) days. For pain. Remove old patch. Rotate sites. External use only 10 patch 0  . lidocaine-prilocaine (EMLA) cream Apply 1 application topically as needed. 30 g 3  . lisinopril (PRINIVIL,ZESTRIL) 10 MG tablet Take 10 mg by mouth at bedtime.     Marland Kitchen morphine 20 MG/5ML solution Take 1.3 mLs (5.2 mg total) by mouth every 6 (six) hours as needed for pain. 100 mL 0  . Nutritional Supplements (FEEDING SUPPLEMENT, OSMOLITE 1.5 CAL,) LIQD Place 240 mLs into feeding tube 5 (five) times daily. 150 Bottle 3  . nystatin (MYCOSTATIN) 100000 UNIT/ML suspension Take 5 mLs by mouth as needed (for thrush).    . ondansetron (ZOFRAN) 4 MG tablet Take 1 tablet (4 mg total) by mouth every 4 (four) hours as needed for nausea or vomiting. 45 tablet 0  . oxycodone (ROXICODONE) 30 MG immediate release tablet Take one tablet by mouth up to 7 (seven) times daily as needed for pain 210 tablet 0  . pantoprazole (PROTONIX) 40 MG tablet Take 1 tablet (40  mg total) by mouth 2 (two) times daily. 60 tablet 3  . phenol (CHLORASEPTIC) 1.4 % LIQD Use as directed 1 spray in the mouth or throat as needed for throat irritation / pain. 177 mL 0  . polyvinyl alcohol (LIQUIFILM TEARS) 1.4 % ophthalmic solution Place 1 drop into both eyes as needed for dry eyes. 15 mL 0  . pravastatin (PRAVACHOL) 40 MG tablet Take 40 mg by mouth at bedtime.     . promethazine (PHENERGAN) 25 MG suppository Place 1 suppository (25 mg total) rectally every 8 (eight) hours as needed for nausea or vomiting. 12 each 0  . sucralfate (CARAFATE) 1 GM/10ML suspension Take 10 mLs (1 g total) by mouth 4 (four) times daily -  with meals and at bedtime. 420 mL 0  . traZODone (DESYREL) 100 MG tablet Take 100 mg by mouth  at bedtime.     No current facility-administered medications for this visit.   Facility-Administered Medications Ordered in Other Visits  Medication Dose Route Frequency Provider Last Rate Last Dose  . sodium chloride 0.9 % injection 10 mL  10 mL Intracatheter PRN Evlyn Kanner, NP   10 mL at 09/10/15 0948  . sodium chloride 0.9 % injection 10 mL  10 mL Intravenous PRN Forest Gleason, MD   10 mL at 10/24/15 1000    OBJECTIVE: PHYSICAL EXAM: Gen. status: Patient is seen lean and cachectic. In wheelchair.  Performance status is 3 HEENT: No soreness in the mouth.  But swelling which is diffuse Lymphatic system: Supraclavicular, cervical, axillary, inguinal lymph nodes are not palpable Lungs: Bilateral rhonchi and occasional crepitation. Cardiac: Tachycardia Examination of the skin revealed no evidence of significant rashes, suspicious appearing nevi or other concerning lesions.. Abdominal exam revealed normal bowel sounds. The abdomen was soft, non-tender, and without masses, organomegaly, or appreciable enlargement of the abdominal aorta..   Peg  placement Psychiatric system: Depression and anxiety  Filed Vitals:   10/24/15 1052  BP: 94/61  Pulse: 86  Temp: 97.4  F (36.3 C)     Body mass index is 15.4 kg/(m^2).    ECOG FS:1 - Symptomatic but completely ambulatory  LAB RESULTS:  Office Visit on 10/24/2015  Component Date Value Ref Range Status  . WBC 10/24/2015 15.6* 3.8 - 10.6 K/uL Final  . RBC 10/24/2015 2.87* 4.40 - 5.90 MIL/uL Final  . Hemoglobin 10/24/2015 8.1* 13.0 - 18.0 g/dL Final  . HCT 10/24/2015 24.9* 40.0 - 52.0 % Final  . MCV 10/24/2015 86.8  80.0 - 100.0 fL Final  . MCH 10/24/2015 28.1  26.0 - 34.0 pg Final  . MCHC 10/24/2015 32.4  32.0 - 36.0 g/dL Final  . RDW 10/24/2015 19.4* 11.5 - 14.5 % Final  . Platelets 10/24/2015 659* 150 - 440 K/uL Final  . Neutrophils Relative % 10/24/2015 67   Final  . Neutro Abs 10/24/2015 10.6* 1.4 - 6.5 K/uL Final  . Lymphocytes Relative 10/24/2015 20   Final  . Lymphs Abs 10/24/2015 3.1  1.0 - 3.6 K/uL Final  . Monocytes Relative 10/24/2015 12   Final  . Monocytes Absolute 10/24/2015 1.8* 0.2 - 1.0 K/uL Final  . Eosinophils Relative 10/24/2015 0   Final  . Eosinophils Absolute 10/24/2015 0.0  0 - 0.7 K/uL Final  . Basophils Relative 10/24/2015 1   Final  . Basophils Absolute 10/24/2015 0.1  0 - 0.1 K/uL Final  . Sodium 10/24/2015 127* 135 - 145 mmol/L Final  . Potassium 10/24/2015 3.9  3.5 - 5.1 mmol/L Final  . Chloride 10/24/2015 95* 101 - 111 mmol/L Final  . CO2 10/24/2015 28  22 - 32 mmol/L Final  . Glucose, Bld 10/24/2015 137* 65 - 99 mg/dL Final  . BUN 10/24/2015 12  6 - 20 mg/dL Final  . Creatinine, Ser 10/24/2015 0.32* 0.61 - 1.24 mg/dL Final  . Calcium 10/24/2015 7.6* 8.9 - 10.3 mg/dL Final  . Total Protein 10/24/2015 5.7* 6.5 - 8.1 g/dL Final  . Albumin 10/24/2015 1.4* 3.5 - 5.0 g/dL Final  . AST 10/24/2015 55* 15 - 41 U/L Final  . ALT 10/24/2015 35  17 - 63 U/L Final  . Alkaline Phosphatase 10/24/2015 90  38 - 126 U/L Final  . Total Bilirubin 10/24/2015 <0.1* 0.3 - 1.2 mg/dL Final  . GFR calc non Af Amer 10/24/2015 >60  >60 mL/min Final  . GFR calc  Af Amer 10/24/2015 >60  >60  mL/min Final   Comment: (NOTE) The eGFR has been calculated using the CKD EPI equation. This calculation has not been validated in all clinical situations. eGFR's persistently <60 mL/min signify possible Chronic Kidney Disease.   . Anion gap 10/24/2015 4* 5 - 15 Final  . Magnesium 10/24/2015 1.8  1.7 - 2.4 mg/dL Final       ASSESSMENT:  A. 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  Carcinoma of tongue in 2006 status post resection and radiation therapy exit staging not known and old records being off pain for review 3.  Recent hospitalization with aspiration pneumonia Patient started [facility] tube feeding Getting rehabilitation therapy Anemia multifactorial On oxygen in wheelchair    PLAN:   Patient's hospitalization record has been reviewed. X-rays have been reviewed. Prolonged discussion with patient After patient is discharged from the rehabilitation evaluation with repeat chest x-ray Dietitian consultation for managing feeding Family is extremely worried about taking care of him at home unless there is significant improvement Social worker help may be needed.  For reevaluation regarding head and neck cancer with a repeat PET scan will be postponed until February or March because of intercurrent infection PET scan would be ordered All other lab data has been reviewed anemia will be followed  Lab data has been reviewed        No matching staging information was found for the patient.  Forest Gleason, MD   10/27/2015 9:24 AM

## 2015-10-29 ENCOUNTER — Telehealth: Payer: Self-pay | Admitting: *Deleted

## 2015-10-29 ENCOUNTER — Non-Acute Institutional Stay (SKILLED_NURSING_FACILITY): Payer: Medicare Other | Admitting: Nurse Practitioner

## 2015-10-29 DIAGNOSIS — K209 Esophagitis, unspecified without bleeding: Secondary | ICD-10-CM

## 2015-10-29 DIAGNOSIS — R1310 Dysphagia, unspecified: Secondary | ICD-10-CM | POA: Diagnosis not present

## 2015-10-29 DIAGNOSIS — E871 Hypo-osmolality and hyponatremia: Secondary | ICD-10-CM | POA: Diagnosis not present

## 2015-10-29 DIAGNOSIS — J690 Pneumonitis due to inhalation of food and vomit: Secondary | ICD-10-CM | POA: Diagnosis not present

## 2015-10-29 DIAGNOSIS — C029 Malignant neoplasm of tongue, unspecified: Secondary | ICD-10-CM

## 2015-10-29 DIAGNOSIS — F329 Major depressive disorder, single episode, unspecified: Secondary | ICD-10-CM

## 2015-10-29 DIAGNOSIS — E43 Unspecified severe protein-calorie malnutrition: Secondary | ICD-10-CM | POA: Diagnosis not present

## 2015-10-29 NOTE — Progress Notes (Signed)
Patient ID: Grant Lee, male   DOB: [birth year], 59 y.o.   MRN: [id number]    Nursing Home Location:  [place] of Service: SNF (31)  PCP: Cyndi Bender, PA-C  No Known Allergies  Chief Complaint  Patient presents with  . Discharge Note    HPI:  Patient is a 59 y.o. male seen today at [facility] for discharge home. Pt has  medical history of throat/tongue cancer s/p chemotherapy and radiation therapy, severe protein calorie malnutrition s/p peg tube, arthritis, depression. Pt at [facility] place  for short term rehabilitation post hospital admission from 10/02/15-10/10/15 with odynophagia, dehydration and pneumonia, treated with Levaquin. He is NPO and on PEG feeding only at present and doing well with this. Patient currently doing well with therapy, now stable to discharge home with home health and family.   Review of Systems:  Review of Systems  Constitutional: Negative for activity change, appetite change, fatigue and unexpected weight change.  HENT: Negative for congestion and hearing loss.   Eyes: Negative.   Respiratory: Positive for shortness of breath (with exertion). Negative for cough.   Cardiovascular: Negative for chest pain, palpitations and leg swelling.  Gastrointestinal: Negative for abdominal pain, diarrhea and constipation.  Genitourinary: Negative for dysuria and difficulty urinating.  Musculoskeletal: Negative for myalgias and arthralgias.  Skin: Negative for color change and wound.  Neurological: Negative for dizziness and weakness.  Psychiatric/Behavioral: Negative for behavioral problems, confusion and agitation.    Past Medical History  Diagnosis Date  . Hypertension   . Pneumonia     2004  . GERD (gastroesophageal reflux disease)   . Arthritis   . Anemia     blood transfusion 2012  . Bleeding ulcer   . Machinery accident     LEG INJURY  . Cancer [facility])     mouth 2006 then again 2016   Past  Surgical History  Procedure Laterality Date  . Leg surgery    . Joint replacement      right hip  . Tongue biopsy    . Tongue surgery      multiple surgeries  . Direct laryngoscopy N/A 06/26/2015    Procedure: Laryngoscopy with tongue base biopsy ;  Surgeon: Beverly Gust, MD;  Location: [facility] ORS;  Service: ENT;  Laterality: N/A;  . Esophagogastroduodenoscopy N/A 07/06/2015    Procedure: ESOPHAGOGASTRODUODENOSCOPY (EGD) and percutaneous gastrostomy tyube placement;  Surgeon: Lollie Sails, MD;  Location: [facility] ENDOSCOPY;  Service: Endoscopy;  Laterality: N/A;  Residual need to be done in the afternoon on 07/06/2015. Combined procedure with Dr. Gustavo Lah and Dr Rayann Heman  . Peripheral vascular catheterization N/A 07/27/2015    Procedure: Glori Luis Cath Insertion;  Surgeon: Katha Cabal, MD;  Location: [place] CV LAB;  Service: Cardiovascular;  Laterality: N/A;  . Esophagogastroduodenoscopy (egd) with propofol N/A 08/07/2015    Procedure: ESOPHAGOGASTRODUODENOSCOPY (EGD) WITH PROPOFOL;  Surgeon: Lollie Sails, MD;  Location: [facility] ENDOSCOPY;  Service: Endoscopy;  Laterality: N/A;  . Peg placement N/A 10/08/2015    Procedure: PERCUTANEOUS ENDOSCOPIC GASTROSTOMY (PEG) PLACEMENT;  Surgeon: Manya Silvas, MD;  Location: [facility] ENDOSCOPY;  Service: Endoscopy;  Laterality: N/A;   Social History:   reports that he quit smoking about 13 months ago. His smoking use included Cigarettes. He smoked 1.00 pack per day for 0 years. He does not have any smokeless tobacco history on file. He reports that he drinks about 7.2 oz of alcohol per week. He  reports that he does not use illicit drugs.  Family History  Problem Relation Age of Onset  . CAD Mother   . CAD Father     Medications: Patient's Medications  New Prescriptions   No medications on file  Previous Medications   ACIDOPHILUS (RISAQUAD) CAPS CAPSULE    Take 1 capsule by mouth at bedtime.    BENZOCAINE (ANBESOL MT)    1 application by  Other route as needed (for gum pain).   CHLORHEXIDINE (PERIDEX) 0.12 % SOLUTION    15 mLs by Mouth Rinse route 2 (two) times daily.   CITALOPRAM (CELEXA) 20 MG TABLET    Take 1 tablet (20 mg total) by mouth daily.   FENTANYL (DURAGESIC - DOSED MCG/HR) 50 MCG/HR    Place 1 patch (50 mcg total) onto the skin every 3 (three) days. For pain. Remove old patch. Rotate sites. External use only   LIDOCAINE-PRILOCAINE (EMLA) CREAM    Apply 1 application topically as needed.   LISINOPRIL (PRINIVIL,ZESTRIL) 10 MG TABLET    Take 10 mg by mouth at bedtime.    MORPHINE 20 MG/5ML SOLUTION    Take 1.3 mLs (5.2 mg total) by mouth every 6 (six) hours as needed for pain.   NUTRITIONAL SUPPLEMENTS (FEEDING SUPPLEMENT, OSMOLITE 1.5 CAL,) LIQD    Place 240 mLs into feeding tube 5 (five) times daily.   NYSTATIN (MYCOSTATIN) 100000 UNIT/ML SUSPENSION    Take 5 mLs by mouth as needed (for thrush).   ONDANSETRON (ZOFRAN) 4 MG TABLET    Take 1 tablet (4 mg total) by mouth every 4 (four) hours as needed for nausea or vomiting.   OXYCODONE (ROXICODONE) 30 MG IMMEDIATE RELEASE TABLET    Take one tablet by mouth up to 7 (seven) times daily as needed for pain   PANTOPRAZOLE (PROTONIX) 40 MG TABLET    Take 1 tablet (40 mg total) by mouth 2 (two) times daily.   PHENOL (CHLORASEPTIC) 1.4 % LIQD    Use as directed 1 spray in the mouth or throat as needed for throat irritation / pain.   POLYVINYL ALCOHOL (LIQUIFILM TEARS) 1.4 % OPHTHALMIC SOLUTION    Place 1 drop into both eyes as needed for dry eyes.   PRAVASTATIN (PRAVACHOL) 40 MG TABLET    Take 40 mg by mouth at bedtime.    PROMETHAZINE (PHENERGAN) 25 MG SUPPOSITORY    Place 1 suppository (25 mg total) rectally every 8 (eight) hours as needed for nausea or vomiting.   SUCRALFATE (CARAFATE) 1 GM/10ML SUSPENSION    Take 10 mLs (1 g total) by mouth 4 (four) times daily -  with meals and at bedtime.   TRAZODONE (DESYREL) 100 MG TABLET    Take 100 mg by mouth at bedtime.  Modified  Medications   No medications on file  Discontinued Medications   No medications on file     Physical Exam: Filed Vitals:   10/29/15 1401  BP: 97/58  Pulse: 84  Temp: 97.3 F (36.3 C)  Resp: 20  Weight: 105 lb (47.628 kg)  SpO2: 93%    Physical Exam  Constitutional: He is oriented to person, place, and time. No distress.  Thin frail male  HENT:  Head: Normocephalic and atraumatic.  Mouth/Throat: Oropharynx is clear and moist. No oropharyngeal exudate.  Eyes: Conjunctivae and EOM are normal. Pupils are equal, round, and reactive to light.  Neck: Normal range of motion. Neck supple.  Cardiovascular: Normal rate, regular rhythm and normal heart sounds.  Pulmonary/Chest: Effort normal and breath sounds normal.  Abdominal: Soft. Bowel sounds are normal.  PEG intact  Musculoskeletal: He exhibits no edema or tenderness.  Neurological: He is alert and oriented to person, place, and time.  Skin: Skin is warm and dry. He is not diaphoretic.  Psychiatric: He has a normal mood and affect.    Labs reviewed: Basic Metabolic Panel:  Recent Labs  10/08/15 0351 10/09/15 0439 10/09/15 1014 10/10/15 0500 10/24/15 1000  NA  --  126* 127* 126* 127*  K 4.4 4.7  --  4.4 3.9  CL  --  94*  --  93* 95*  CO2  --  28  --  28 28  GLUCOSE  --  118*  --  114* 137*  BUN  --  13  --  13 12  CREATININE  --  0.33*  --  <0.30* 0.32*  CALCIUM  --  7.7*  --  7.6* 7.6*  MG 2.0 1.8  --  1.9 1.8  PHOS 3.3 3.7  --  3.8  --    Liver Function Tests:  Recent Labs  10/02/15 1140 10/05/15 0816 10/24/15 1000  AST 13* 20 55*  ALT 10* 11* 35  ALKPHOS 61 63 90  BILITOT 0.5 0.3 <0.1*  PROT 6.0* 5.9* 5.7*  ALBUMIN 2.1* 1.7* 1.4*    Recent Labs  08/03/15 1130  LIPASE 126*   No results for input(s): AMMONIA in the last 8760 hours. CBC:  Recent Labs  10/02/15 1140 10/05/15 0816 10/24/15 1000  WBC 13.8* 6.3 15.6*  NEUTROABS 12.0* 5.4 10.6*  HGB 10.9* 10.8* 8.1*  HCT 33.6* 33.3* 24.9*    MCV 95.0 94.3 86.8  PLT 277 222 659*   TSH:  Recent Labs  06/11/15 1025 10/10/15 1202  TSH 1.020 2.188   A1C: Lab Results  Component Value Date   HGBA1C 5.3 10/06/2015   Lipid Panel:  Recent Labs  10/05/15 0816  TRIG 94    Assessment/Plan 1. Pneumonitis due to inhalation of food or vomitus ([place]) -completed Levaquin, follow up with oncologist and was placed on 7 additional days of amoxicillin. Pt without worsening symptoms. Has follow up chest xray scheduled  2. Odynophagia Improved.   3. Hyponatremia -remains stable, will need ongoing follow up bmp by PCP  4. Severe protein-calorie malnutrition Altamease Oiler: less than 60% of standard weight) ([place]) All feeding via PEG, tolerating well. conts on Osmolite 5 times daily    5. Tongue cancer ([place]) S/p chemo and radiation a week prior to hospitalization. Currently on Morphine 20 mg/14m 5.2 mg q6h prn pain, Fentanyl patch 50 mcg/hr q3 days, Oxycodone 30 mg q3h prn pain.   6. Esophagitis Stable, Continue carafate 1g QID and protonix 40 mg BID  7. Major depression, chronic (HCC) Stable on celexa 20 mg daily   pt is stable for discharge-will need PT/OT per home health. DME needed includes WC and O2. Rx written.  will need to follow up with PCP within 2 weeks and to keep oncology follow up on 11/01/15   Paiton Fosco K. EHarle Battiest PS[facility] [phone] am - 5 pm) [phone](after hours)

## 2015-10-29 NOTE — Telephone Encounter (Signed)
Called to check on form that was faxed to provider on 10-25-15.  Per Wynelle Link - form is on her desk to be signed and faxed back.

## 2015-10-30 DIAGNOSIS — R1310 Dysphagia, unspecified: Secondary | ICD-10-CM | POA: Diagnosis not present

## 2015-10-30 DIAGNOSIS — Z85810 Personal history of malignant neoplasm of tongue: Secondary | ICD-10-CM | POA: Diagnosis not present

## 2015-10-30 DIAGNOSIS — E46 Unspecified protein-calorie malnutrition: Secondary | ICD-10-CM | POA: Diagnosis not present

## 2015-10-30 DIAGNOSIS — C029 Malignant neoplasm of tongue, unspecified: Secondary | ICD-10-CM | POA: Diagnosis not present

## 2015-10-30 DIAGNOSIS — M6281 Muscle weakness (generalized): Secondary | ICD-10-CM | POA: Diagnosis not present

## 2015-10-30 DIAGNOSIS — C028 Malignant neoplasm of overlapping sites of tongue: Secondary | ICD-10-CM | POA: Diagnosis not present

## 2015-11-01 ENCOUNTER — Inpatient Hospital Stay: Payer: Medicare Other

## 2015-11-01 ENCOUNTER — Ambulatory Visit
Admission: RE | Admit: 2015-11-01 | Discharge: 2015-11-01 | Disposition: A | Discharge status: Home / Self Care | Payer: Medicare Other | Source: Ambulatory Visit | Attending: Oncology | Admitting: Oncology

## 2015-11-01 ENCOUNTER — Inpatient Hospital Stay (HOSPITAL_BASED_OUTPATIENT_CLINIC_OR_DEPARTMENT_OTHER): Payer: Medicare Other | Admitting: Oncology

## 2015-11-01 VITALS — BP 88/51 | HR 93 | Temp 97.0°F | Wt 112.2 lb

## 2015-11-01 DIAGNOSIS — D649 Anemia, unspecified: Secondary | ICD-10-CM | POA: Diagnosis not present

## 2015-11-01 DIAGNOSIS — Z8701 Personal history of pneumonia (recurrent): Secondary | ICD-10-CM | POA: Diagnosis not present

## 2015-11-01 DIAGNOSIS — Z9221 Personal history of antineoplastic chemotherapy: Secondary | ICD-10-CM

## 2015-11-01 DIAGNOSIS — J690 Pneumonitis due to inhalation of food and vomit: Secondary | ICD-10-CM

## 2015-11-01 DIAGNOSIS — R42 Dizziness and giddiness: Secondary | ICD-10-CM | POA: Diagnosis not present

## 2015-11-01 DIAGNOSIS — Z85810 Personal history of malignant neoplasm of tongue: Secondary | ICD-10-CM | POA: Diagnosis not present

## 2015-11-01 DIAGNOSIS — D500 Iron deficiency anemia secondary to blood loss (chronic): Secondary | ICD-10-CM

## 2015-11-01 DIAGNOSIS — C029 Malignant neoplasm of tongue, unspecified: Secondary | ICD-10-CM

## 2015-11-01 DIAGNOSIS — E7800 Pure hypercholesterolemia, unspecified: Secondary | ICD-10-CM | POA: Diagnosis not present

## 2015-11-01 DIAGNOSIS — J90 Pleural effusion, not elsewhere classified: Secondary | ICD-10-CM

## 2015-11-01 DIAGNOSIS — F1010 Alcohol abuse, uncomplicated: Secondary | ICD-10-CM | POA: Diagnosis not present

## 2015-11-01 DIAGNOSIS — K219 Gastro-esophageal reflux disease without esophagitis: Secondary | ICD-10-CM | POA: Diagnosis not present

## 2015-11-01 DIAGNOSIS — C01 Malignant neoplasm of base of tongue: Secondary | ICD-10-CM | POA: Diagnosis not present

## 2015-11-01 DIAGNOSIS — F1120 Opioid dependence, uncomplicated: Secondary | ICD-10-CM | POA: Diagnosis not present

## 2015-11-01 DIAGNOSIS — Z79899 Other long term (current) drug therapy: Secondary | ICD-10-CM | POA: Diagnosis not present

## 2015-11-01 DIAGNOSIS — J189 Pneumonia, unspecified organism: Secondary | ICD-10-CM | POA: Diagnosis not present

## 2015-11-01 DIAGNOSIS — Z87891 Personal history of nicotine dependence: Secondary | ICD-10-CM | POA: Diagnosis not present

## 2015-11-01 DIAGNOSIS — Z923 Personal history of irradiation: Secondary | ICD-10-CM

## 2015-11-01 DIAGNOSIS — D72829 Elevated white blood cell count, unspecified: Secondary | ICD-10-CM

## 2015-11-01 DIAGNOSIS — I10 Essential (primary) hypertension: Secondary | ICD-10-CM | POA: Diagnosis not present

## 2015-11-01 DIAGNOSIS — G8929 Other chronic pain: Secondary | ICD-10-CM

## 2015-11-01 DIAGNOSIS — C801 Malignant (primary) neoplasm, unspecified: Secondary | ICD-10-CM

## 2015-11-01 DIAGNOSIS — R1310 Dysphagia, unspecified: Secondary | ICD-10-CM | POA: Diagnosis not present

## 2015-11-01 DIAGNOSIS — Z09 Encounter for follow-up examination after completed treatment for conditions other than malignant neoplasm: Secondary | ICD-10-CM | POA: Insufficient documentation

## 2015-11-01 DIAGNOSIS — R634 Abnormal weight loss: Secondary | ICD-10-CM

## 2015-11-01 DIAGNOSIS — Z931 Gastrostomy status: Secondary | ICD-10-CM | POA: Diagnosis not present

## 2015-11-01 DIAGNOSIS — M549 Dorsalgia, unspecified: Secondary | ICD-10-CM

## 2015-11-01 LAB — COMPREHENSIVE METABOLIC PANEL
ALK PHOS: 87 U/L (ref 38–126)
ALT: 48 U/L (ref 17–63)
ANION GAP: 7 (ref 5–15)
AST: 30 U/L (ref 15–41)
Albumin: 1.8 g/dL — ABNORMAL LOW (ref 3.5–5.0)
BUN: 15 mg/dL (ref 6–20)
CALCIUM: 8.2 mg/dL — AB (ref 8.9–10.3)
CO2: 27 mmol/L (ref 22–32)
Chloride: 93 mmol/L — ABNORMAL LOW (ref 101–111)
Creatinine, Ser: 0.52 mg/dL — ABNORMAL LOW (ref 0.61–1.24)
GFR calc non Af Amer: >60 mL/min (ref >60)
Glucose, Bld: 102 mg/dL — ABNORMAL HIGH (ref 65–99)
POTASSIUM: 4.5 mmol/L (ref 3.5–5.1)
SODIUM: 127 mmol/L — AB (ref 135–145)
TOTAL PROTEIN: 7.2 g/dL (ref 6.5–8.1)
Total Bilirubin: 0.2 mg/dL — ABNORMAL LOW (ref 0.3–1.2)

## 2015-11-01 LAB — MAGNESIUM: Magnesium: 2 mg/dL (ref 1.7–2.4)

## 2015-11-01 LAB — CBC WITH DIFFERENTIAL/PLATELET
Basophils Absolute: 0.1 10*3/uL (ref 0–0.1)
Basophils Relative: 0 %
EOS ABS: 0 10*3/uL (ref 0–0.7)
EOS PCT: 0 %
HCT: 28 % — ABNORMAL LOW (ref 40.0–52.0)
HEMOGLOBIN: 8.8 g/dL — AB (ref 13.0–18.0)
LYMPHS ABS: 4.2 10*3/uL — AB (ref 1.0–3.6)
Lymphocytes Relative: 22 %
MCH: 27.3 pg (ref 26.0–34.0)
MCHC: 31.6 g/dL — AB (ref 32.0–36.0)
MCV: 86.4 fL (ref 80.0–100.0)
MONO ABS: 1.5 10*3/uL — AB (ref 0.2–1.0)
MONOS PCT: 8 %
Neutro Abs: 13.4 10*3/uL — ABNORMAL HIGH (ref 1.4–6.5)
Neutrophils Relative %: 70 %
Platelets: 862 10*3/uL — ABNORMAL HIGH (ref 150–440)
RBC: 3.24 MIL/uL — ABNORMAL LOW (ref 4.40–5.90)
RDW: 20.2 % — AB (ref 11.5–14.5)
WBC: 19.1 10*3/uL — ABNORMAL HIGH (ref 3.8–10.6)

## 2015-11-01 LAB — SAMPLE TO BLOOD BANK

## 2015-11-01 MED ORDER — ALTEPLASE 2 MG IJ SOLR: 2.0000 mg | Freq: Once | INTRAMUSCULAR | Status: DC | PRN

## 2015-11-01 MED ORDER — SODIUM CHLORIDE 0.9 % IJ SOLN
10.0000 mL | INTRAMUSCULAR | Status: DC | PRN
  Administered 2015-11-01: 10 mL
  Filled 2015-11-01: qty 10

## 2015-11-01 MED ORDER — HEPARIN SOD (PORK) LOCK FLUSH 100 UNIT/ML IV SOLN: 250.0000 [IU] | Freq: Once | INTRAVENOUS | Status: DC | PRN

## 2015-11-01 MED ORDER — HEPARIN SOD (PORK) LOCK FLUSH 100 UNIT/ML IV SOLN: 500.0000 [IU] | Freq: Once | INTRAVENOUS | Status: DC | PRN

## 2015-11-01 MED ORDER — DEXAMETHASONE SODIUM PHOSPHATE 100 MG/10ML IJ SOLN
Freq: Once | INTRAMUSCULAR | Status: AC
  Administered 2015-11-01: 15:00:00 via INTRAVENOUS
  Filled 2015-11-01: qty 0.8

## 2015-11-01 MED ORDER — HEPARIN SOD (PORK) LOCK FLUSH 100 UNIT/ML IV SOLN
INTRAVENOUS | Status: AC
  Filled 2015-11-01: qty 5

## 2015-11-01 MED ORDER — SODIUM CHLORIDE 0.9 % IV SOLN
Freq: Once | INTRAVENOUS | Status: AC
  Administered 2015-11-01: 15:00:00 via INTRAVENOUS
  Filled 2015-11-01: qty 1000

## 2015-11-02 DIAGNOSIS — G894 Chronic pain syndrome: Secondary | ICD-10-CM | POA: Diagnosis not present

## 2015-11-02 DIAGNOSIS — E43 Unspecified severe protein-calorie malnutrition: Secondary | ICD-10-CM | POA: Diagnosis not present

## 2015-11-02 DIAGNOSIS — R1310 Dysphagia, unspecified: Secondary | ICD-10-CM | POA: Diagnosis not present

## 2015-11-02 DIAGNOSIS — D649 Anemia, unspecified: Secondary | ICD-10-CM | POA: Diagnosis not present

## 2015-11-02 DIAGNOSIS — J690 Pneumonitis due to inhalation of food and vomit: Secondary | ICD-10-CM | POA: Diagnosis not present

## 2015-11-02 DIAGNOSIS — Z9981 Dependence on supplemental oxygen: Secondary | ICD-10-CM | POA: Diagnosis not present

## 2015-11-02 DIAGNOSIS — M1990 Unspecified osteoarthritis, unspecified site: Secondary | ICD-10-CM | POA: Diagnosis not present

## 2015-11-02 DIAGNOSIS — Z431 Encounter for attention to gastrostomy: Secondary | ICD-10-CM | POA: Diagnosis not present

## 2015-11-02 DIAGNOSIS — Z85810 Personal history of malignant neoplasm of tongue: Secondary | ICD-10-CM | POA: Diagnosis not present

## 2015-11-02 DIAGNOSIS — K219 Gastro-esophageal reflux disease without esophagitis: Secondary | ICD-10-CM | POA: Diagnosis not present

## 2015-11-06 ENCOUNTER — Other Ambulatory Visit: Payer: Self-pay | Admitting: *Deleted

## 2015-11-06 DIAGNOSIS — Z9981 Dependence on supplemental oxygen: Secondary | ICD-10-CM | POA: Diagnosis not present

## 2015-11-06 DIAGNOSIS — J690 Pneumonitis due to inhalation of food and vomit: Secondary | ICD-10-CM | POA: Diagnosis not present

## 2015-11-06 DIAGNOSIS — G894 Chronic pain syndrome: Secondary | ICD-10-CM | POA: Diagnosis not present

## 2015-11-06 DIAGNOSIS — E43 Unspecified severe protein-calorie malnutrition: Secondary | ICD-10-CM | POA: Diagnosis not present

## 2015-11-06 DIAGNOSIS — R1310 Dysphagia, unspecified: Secondary | ICD-10-CM | POA: Diagnosis not present

## 2015-11-06 DIAGNOSIS — K219 Gastro-esophageal reflux disease without esophagitis: Secondary | ICD-10-CM | POA: Diagnosis not present

## 2015-11-06 DIAGNOSIS — Z85810 Personal history of malignant neoplasm of tongue: Secondary | ICD-10-CM | POA: Diagnosis not present

## 2015-11-06 DIAGNOSIS — Z431 Encounter for attention to gastrostomy: Secondary | ICD-10-CM | POA: Diagnosis not present

## 2015-11-06 DIAGNOSIS — M1990 Unspecified osteoarthritis, unspecified site: Secondary | ICD-10-CM | POA: Diagnosis not present

## 2015-11-06 DIAGNOSIS — C029 Malignant neoplasm of tongue, unspecified: Secondary | ICD-10-CM

## 2015-11-06 DIAGNOSIS — D649 Anemia, unspecified: Secondary | ICD-10-CM | POA: Diagnosis not present

## 2015-11-07 ENCOUNTER — Encounter: Payer: Self-pay | Admitting: Oncology

## 2015-11-07 DIAGNOSIS — K219 Gastro-esophageal reflux disease without esophagitis: Secondary | ICD-10-CM | POA: Diagnosis not present

## 2015-11-07 DIAGNOSIS — G894 Chronic pain syndrome: Secondary | ICD-10-CM | POA: Diagnosis not present

## 2015-11-07 DIAGNOSIS — E43 Unspecified severe protein-calorie malnutrition: Secondary | ICD-10-CM | POA: Diagnosis not present

## 2015-11-07 DIAGNOSIS — R1310 Dysphagia, unspecified: Secondary | ICD-10-CM | POA: Diagnosis not present

## 2015-11-07 DIAGNOSIS — Z85810 Personal history of malignant neoplasm of tongue: Secondary | ICD-10-CM | POA: Diagnosis not present

## 2015-11-07 DIAGNOSIS — D649 Anemia, unspecified: Secondary | ICD-10-CM | POA: Diagnosis not present

## 2015-11-07 DIAGNOSIS — J690 Pneumonitis due to inhalation of food and vomit: Secondary | ICD-10-CM | POA: Diagnosis not present

## 2015-11-07 DIAGNOSIS — Z9981 Dependence on supplemental oxygen: Secondary | ICD-10-CM | POA: Diagnosis not present

## 2015-11-07 DIAGNOSIS — Z431 Encounter for attention to gastrostomy: Secondary | ICD-10-CM | POA: Diagnosis not present

## 2015-11-07 DIAGNOSIS — M1990 Unspecified osteoarthritis, unspecified site: Secondary | ICD-10-CM | POA: Diagnosis not present

## 2015-11-07 NOTE — Progress Notes (Signed)
[place] @ [facility] [phone]  Fax:(336) [place] OB: November 29, 1955  MR#: [id number]  ZJQ#:[id number]  Patient Care Team: Cyndi Bender, PA-C as PCP - General (Physician Assistant) Beverly Gust, MD (Unknown Physician Specialty)  CHIEF COMPLAINT:  Chief Complaint  Patient presents with  . Tongue Cancer   1.  Has a history of cancer of the tongue in 2006.  Patient underwent resection followed by radiation therapy 2.  Increasing difficulty in swallowing for last 2 or 3 weeks.  Patient had upper endoscopy done in December of 2015. 3.  Significant weight loss 4.  Abnormal PET scan (August of 2016) 5.  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  (Cis-platinum and radiation therapy) (September 2 016) T3 N0 M0 tumor Status post PEG tube placement (September, 2016) 6. Poor tolerance dose cis-platinum therapy, so patient has been switched over to cetuximab along with radiation therapy (October, 2016) 7.  Patient has finished cetuximab as well as radiation therapy by 23rd of November 2 016 8.  Patient was recently hospitalized with bilateral pneumonia, aspiration pneumonia and was transferred to rehabilitation (December, 2016)  VISIT DIAGNOSIS:  Significant weight loss. Difficulty swallowing. History of carcinoma of tongue    Oncology Flowsheet 09/27/2015 10/02/2015 10/03/2015 10/04/2015 10/05/2015 10/09/2015 11/01/2015  Day, Cycle - - - - - - -  cetuximab (ERBITUX) IV - - - - - - -  CISplatin (PLATINOL) IV - - - - - - -  dexamethasone (DECADRON) IJ - - - - - - -  dexamethasone (DECADRON) IV 8 mg - - - - - [ 8 mg ]  enoxaparin (LOVENOX) [place] - 40 mg 40 mg 40 mg 40 mg 40 mg -  fosaprepitant (EMEND) IV - - - - - - -  granisetron (KYTRIL) PO - - - - - - -  LORazepam (ATIVAN) IV - - - 0.5 mg - - -  methylPREDNISolone sodium succinate 40 mg/mL (SOLU-MEDROL) IV -  - - 20 mg 10 mg - -  metoCLOPramide (REGLAN) IV - - - - - - -  ondansetron (ZOFRAN) IV - - - - - - -  palonosetron (ALOXI) IV - - - - - - -  predniSONE (DELTASONE) PO - - 30 mg - - - -  promethazine (PHENERGAN) IM - - - - - - -  promethazine (PHENERGAN) IV - - - - - - -    INTERVAL HISTORY:  Patient was recently discharged from the [facility] care, rehabilitation department. Patient and family was not too happy with the care provided there.  Feeling extremely weak and tired and dizzy.  Oral intake is decreased. 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   Declining performance status because of intercurrent infection REVIEW OF SYSTEMS:   Gen. status: Patient is feeling weak and tired.  Has lost significant weight Recently hospitalized with bilateral pneumonia Poor appetite patient is very depressed.  Losing weight. Increasing difficulty swallowing as mentioned in history of present illness HEENT: As mentioned in history of present illness patient had history of carcinoma of tongue status post radiation therapy recently having increasing soreness in the mouth. Lungs: Increasing cough shortness  of breath yellowish expectoration no fever no hemoptysis GI: Persistent nausea as described above in history of present illness [place] close skeletal system no bony pain Lower extremity no swelling Skin: No rash Abdomen: Had a PEG tube placement  As per HPI. Otherwise, a complete review of systems is negatve.  PAST MEDICAL HISTORY: Carcinoma of tongue Hypertension Hypercholesterolemia Previous substance abuse Gastroesophageal reflux disease  PAST SURGICAL HISTORY: Treated for carcinoma of tongue FAMILY HISTORY There is no  significant family history of breast cancer, ovarian cancer, colon cancer    ADVANCED DIRECTIVES:  Patient does not have any living will or healthcare power of attorney.  Information was given .  Available resources had been discussed.  We will follow-up on subsequent appointments regarding this issue  HEALTH MAINTENANCE: Social History  Substance Use Topics  . Smoking status: Former Smoker -- 1.00 packs/day for 0 years    Types: Cigarettes    Quit date: 09/21/2014  . Smokeless tobacco: None  . Alcohol Use: 7.2 oz/week    12 Cans of beer per week     Comment: beer/wine every day    Quit smoking in November of 2015.  History of smoking for several years in the past.  No Known Allergies  Current Outpatient Prescriptions  Medication Sig Dispense Refill  . acidophilus (RISAQUAD) CAPS capsule Take 1 capsule by mouth at bedtime.     . Benzocaine (ANBESOL MT) 1 application by Other route as needed (for gum pain).    . chlorhexidine (PERIDEX) 0.12 % solution 15 mLs by Mouth Rinse route 2 (two) times daily. 120 mL 0  . citalopram (CELEXA) 20 MG tablet Take 1 tablet (20 mg total) by mouth daily. 30 tablet 3  . fentaNYL (DURAGESIC - DOSED MCG/HR) 50 MCG/HR Place 1 patch (50 mcg total) onto the skin every 3 (three) days. For pain. Remove old patch. Rotate sites. External use only 10 patch 0  . lidocaine-prilocaine (EMLA) cream Apply 1 application topically as needed. 30 g 3  . lisinopril (PRINIVIL,ZESTRIL) 10 MG tablet Take 10 mg by mouth at bedtime.     Marland Kitchen morphine 20 MG/5ML solution Take 1.3 mLs (5.2 mg total) by mouth every 6 (six) hours as needed for pain. 100 mL 0  . Nutritional Supplements (FEEDING SUPPLEMENT, OSMOLITE 1.5 CAL,) LIQD Place 240 mLs into feeding tube 5 (five) times daily. 150 Bottle 3  . nystatin (MYCOSTATIN) 100000 UNIT/ML suspension Take 5 mLs by mouth as needed (for thrush).    . ondansetron (ZOFRAN) 4 MG tablet Take 1 tablet (4 mg total) by mouth every 4 (four) hours as  needed for nausea or vomiting. 45 tablet 0  . oxycodone (ROXICODONE) 30 MG immediate release tablet Take one tablet by mouth up to 7 (seven) times daily as needed for pain (Patient taking differently: Take 30 mg by mouth. Take one tablet by mouth up to 7 (seven) times daily as needed for pain) 210 tablet 0  . pantoprazole (PROTONIX) 40 MG tablet Take 1 tablet (40 mg total) by mouth 2 (two) times daily. 60 tablet 3  . phenol (CHLORASEPTIC) 1.4 % LIQD Use as directed 1 spray in the mouth or throat as needed for throat irritation / pain. 177 mL 0  . polyvinyl alcohol (LIQUIFILM TEARS) 1.4 % ophthalmic solution Place 1 drop into both eyes as needed for dry eyes. 15 mL 0  . pravastatin (PRAVACHOL) 40 MG tablet Take 40 mg by mouth at bedtime.     . promethazine (PHENERGAN) 25  MG suppository Place 1 suppository (25 mg total) rectally every 8 (eight) hours as needed for nausea or vomiting. 12 each 0  . sucralfate (CARAFATE) 1 GM/10ML suspension Take 10 mLs (1 g total) by mouth 4 (four) times daily -  with meals and at bedtime. 420 mL 0  . traZODone (DESYREL) 100 MG tablet Take 100 mg by mouth at bedtime.     No current facility-administered medications for this visit.   Facility-Administered Medications Ordered in Other Visits  Medication Dose Route Frequency Provider Last Rate Last Dose  . sodium chloride 0.9 % injection 10 mL  10 mL Intracatheter PRN Evlyn Kanner, NP   10 mL at 09/10/15 0948  . sodium chloride 0.9 % injection 10 mL  10 mL Intravenous PRN Forest Gleason, MD   10 mL at 10/24/15 1000    OBJECTIVE: PHYSICAL EXAM: Gen. status: Patient is seen lean and cachectic. Feeling extremely weak and tired.  This membranes are dry skin turgor poor In wheelchair.  Performance status is 3 HEENT: No soreness in the mouth.  But swelling which is diffuse Lymphatic system: Supraclavicular, cervical, axillary, inguinal lymph nodes are not palpable Lungs: Bilateral rhonchi and occasional  crepitation. Cardiac: Tachycardia Examination of the skin revealed no evidence of significant rashes, suspicious appearing nevi or other concerning lesions.. Abdominal exam revealed normal bowel sounds. The abdomen was soft, non-tender, and without masses, organomegaly, or appreciable enlargement of the abdominal aorta..   Peg  placement Psychiatric system: Depression and anxiety  Filed Vitals:   11/01/15 1406  BP: 88/51  Pulse: 93  Temp: 97 F (36.1 C)     Body mass index is 14.41 kg/(m^2).    ECOG FS:3 - Symptomatic, >50% confined to bed  LAB RESULTS:  Appointment on 11/01/2015  Component Date Value Ref Range Status  . WBC 11/01/2015 19.1* 3.8 - 10.6 K/uL Final  . RBC 11/01/2015 3.24* 4.40 - 5.90 MIL/uL Final  . Hemoglobin 11/01/2015 8.8* 13.0 - 18.0 g/dL Final  . HCT 11/01/2015 28.0* 40.0 - 52.0 % Final  . MCV 11/01/2015 86.4  80.0 - 100.0 fL Final  . MCH 11/01/2015 27.3  26.0 - 34.0 pg Final  . MCHC 11/01/2015 31.6* 32.0 - 36.0 g/dL Final  . RDW 11/01/2015 20.2* 11.5 - 14.5 % Final  . Platelets 11/01/2015 862* 150 - 440 K/uL Final  . Neutrophils Relative % 11/01/2015 70   Final  . Neutro Abs 11/01/2015 13.4* 1.4 - 6.5 K/uL Final  . Lymphocytes Relative 11/01/2015 22   Final  . Lymphs Abs 11/01/2015 4.2* 1.0 - 3.6 K/uL Final  . Monocytes Relative 11/01/2015 8   Final  . Monocytes Absolute 11/01/2015 1.5* 0.2 - 1.0 K/uL Final  . Eosinophils Relative 11/01/2015 0   Final  . Eosinophils Absolute 11/01/2015 0.0  0 - 0.7 K/uL Final  . Basophils Relative 11/01/2015 0   Final  . Basophils Absolute 11/01/2015 0.1  0 - 0.1 K/uL Final  . Sodium 11/01/2015 127* 135 - 145 mmol/L Final  . Potassium 11/01/2015 4.5  3.5 - 5.1 mmol/L Final  . Chloride 11/01/2015 93* 101 - 111 mmol/L Final  . CO2 11/01/2015 27  22 - 32 mmol/L Final  . Glucose, Bld 11/01/2015 102* 65 - 99 mg/dL Final  . BUN 11/01/2015 15  6 - 20 mg/dL Final  . Creatinine, Ser 11/01/2015 0.52* 0.61 - 1.24 mg/dL Final  .  Calcium 11/01/2015 8.2* 8.9 - 10.3 mg/dL Final  . Total Protein 11/01/2015 7.2  6.5 - 8.1 g/dL Final  . Albumin 11/01/2015 1.8* 3.5 - 5.0 g/dL Final  . AST 11/01/2015 30  15 - 41 U/L Final  . ALT 11/01/2015 48  17 - 63 U/L Final  . Alkaline Phosphatase 11/01/2015 87  38 - 126 U/L Final  . Total Bilirubin 11/01/2015 0.2* 0.3 - 1.2 mg/dL Final  . GFR calc non Af Amer 11/01/2015 >60  >60 mL/min Final  . GFR calc Af Amer 11/01/2015 >60  >60 mL/min Final   Comment: (NOTE) The eGFR has been calculated using the CKD EPI equation. This calculation has not been validated in all clinical situations. eGFR's persistently <60 mL/min signify possible Chronic Kidney Disease.   . Anion gap 11/01/2015 7  5 - 15 Final  . Magnesium 11/01/2015 2.0  1.7 - 2.4 mg/dL Final  . Blood Bank Specimen 11/01/2015 SAMPLE AVAILABLE FOR TESTING   Final  . Sample Expiration 11/01/2015 11/04/2015   Final       ASSESSMENT:  A. 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  Carcinoma of tongue in 2006 status post resection and radiation therapy exit staging not known and old records being off pain for review 3.  Recent hospitalization with aspiration pneumonia Patient has been discharged from the rehabilitation    PLAN:   1.  Dizziness secondary to multifactorial poor oral intake We will give IV fluid 2.  Anemia multifactorial will assess iron studies 3.  Aspiration pneumonia.  Chest x-ray has been reviewed.  Pleural effusion.  Bilateral airspace disease Leucocytosis.  4.  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  Was advised not to refill that medication continue fentanyl patch and 5 tablets of oxycodone and we will refer patient back to the pain clinic once the general condition improves somewhat.  Overall very  poor prognosis due to multiple factors Patient may benefit from ongoing physiotherapy.  Referred to home health  Services     Lab data has been reviewed        No matching staging information was found for the patient.  Forest Gleason, MD   11/07/2015 8:53 AM

## 2015-11-08 DIAGNOSIS — E43 Unspecified severe protein-calorie malnutrition: Secondary | ICD-10-CM | POA: Diagnosis not present

## 2015-11-08 DIAGNOSIS — Z431 Encounter for attention to gastrostomy: Secondary | ICD-10-CM | POA: Diagnosis not present

## 2015-11-08 DIAGNOSIS — K219 Gastro-esophageal reflux disease without esophagitis: Secondary | ICD-10-CM | POA: Diagnosis not present

## 2015-11-08 DIAGNOSIS — D649 Anemia, unspecified: Secondary | ICD-10-CM | POA: Diagnosis not present

## 2015-11-08 DIAGNOSIS — J690 Pneumonitis due to inhalation of food and vomit: Secondary | ICD-10-CM | POA: Diagnosis not present

## 2015-11-08 DIAGNOSIS — G894 Chronic pain syndrome: Secondary | ICD-10-CM | POA: Diagnosis not present

## 2015-11-08 DIAGNOSIS — Z9981 Dependence on supplemental oxygen: Secondary | ICD-10-CM | POA: Diagnosis not present

## 2015-11-08 DIAGNOSIS — Z85810 Personal history of malignant neoplasm of tongue: Secondary | ICD-10-CM | POA: Diagnosis not present

## 2015-11-08 DIAGNOSIS — R1310 Dysphagia, unspecified: Secondary | ICD-10-CM | POA: Diagnosis not present

## 2015-11-08 DIAGNOSIS — M1990 Unspecified osteoarthritis, unspecified site: Secondary | ICD-10-CM | POA: Diagnosis not present

## 2015-11-09 DIAGNOSIS — Z85810 Personal history of malignant neoplasm of tongue: Secondary | ICD-10-CM | POA: Diagnosis not present

## 2015-11-09 DIAGNOSIS — E46 Unspecified protein-calorie malnutrition: Secondary | ICD-10-CM | POA: Diagnosis not present

## 2015-11-09 DIAGNOSIS — C028 Malignant neoplasm of overlapping sites of tongue: Secondary | ICD-10-CM | POA: Diagnosis not present

## 2015-11-09 DIAGNOSIS — C029 Malignant neoplasm of tongue, unspecified: Secondary | ICD-10-CM | POA: Diagnosis not present

## 2015-11-09 DIAGNOSIS — R1310 Dysphagia, unspecified: Secondary | ICD-10-CM | POA: Diagnosis not present

## 2015-11-13 DIAGNOSIS — M1990 Unspecified osteoarthritis, unspecified site: Secondary | ICD-10-CM | POA: Diagnosis not present

## 2015-11-13 DIAGNOSIS — Z85810 Personal history of malignant neoplasm of tongue: Secondary | ICD-10-CM | POA: Diagnosis not present

## 2015-11-13 DIAGNOSIS — G894 Chronic pain syndrome: Secondary | ICD-10-CM | POA: Diagnosis not present

## 2015-11-13 DIAGNOSIS — Z9981 Dependence on supplemental oxygen: Secondary | ICD-10-CM | POA: Diagnosis not present

## 2015-11-13 DIAGNOSIS — E43 Unspecified severe protein-calorie malnutrition: Secondary | ICD-10-CM | POA: Diagnosis not present

## 2015-11-13 DIAGNOSIS — D649 Anemia, unspecified: Secondary | ICD-10-CM | POA: Diagnosis not present

## 2015-11-13 DIAGNOSIS — K219 Gastro-esophageal reflux disease without esophagitis: Secondary | ICD-10-CM | POA: Diagnosis not present

## 2015-11-13 DIAGNOSIS — R1310 Dysphagia, unspecified: Secondary | ICD-10-CM | POA: Diagnosis not present

## 2015-11-13 DIAGNOSIS — Z431 Encounter for attention to gastrostomy: Secondary | ICD-10-CM | POA: Diagnosis not present

## 2015-11-13 DIAGNOSIS — J690 Pneumonitis due to inhalation of food and vomit: Secondary | ICD-10-CM | POA: Diagnosis not present

## 2015-11-14 DIAGNOSIS — K219 Gastro-esophageal reflux disease without esophagitis: Secondary | ICD-10-CM | POA: Diagnosis not present

## 2015-11-14 DIAGNOSIS — E43 Unspecified severe protein-calorie malnutrition: Secondary | ICD-10-CM | POA: Diagnosis not present

## 2015-11-14 DIAGNOSIS — R1310 Dysphagia, unspecified: Secondary | ICD-10-CM | POA: Diagnosis not present

## 2015-11-14 DIAGNOSIS — G894 Chronic pain syndrome: Secondary | ICD-10-CM | POA: Diagnosis not present

## 2015-11-14 DIAGNOSIS — M1990 Unspecified osteoarthritis, unspecified site: Secondary | ICD-10-CM | POA: Diagnosis not present

## 2015-11-14 DIAGNOSIS — Z9981 Dependence on supplemental oxygen: Secondary | ICD-10-CM | POA: Diagnosis not present

## 2015-11-14 DIAGNOSIS — J690 Pneumonitis due to inhalation of food and vomit: Secondary | ICD-10-CM | POA: Diagnosis not present

## 2015-11-14 DIAGNOSIS — D649 Anemia, unspecified: Secondary | ICD-10-CM | POA: Diagnosis not present

## 2015-11-14 DIAGNOSIS — Z85810 Personal history of malignant neoplasm of tongue: Secondary | ICD-10-CM | POA: Diagnosis not present

## 2015-11-14 DIAGNOSIS — Z431 Encounter for attention to gastrostomy: Secondary | ICD-10-CM | POA: Diagnosis not present

## 2015-11-15 ENCOUNTER — Inpatient Hospital Stay: Payer: Medicare Other

## 2015-11-15 ENCOUNTER — Inpatient Hospital Stay (HOSPITAL_BASED_OUTPATIENT_CLINIC_OR_DEPARTMENT_OTHER): Payer: Medicare Other | Admitting: Oncology

## 2015-11-15 ENCOUNTER — Inpatient Hospital Stay: Payer: Medicare Other | Attending: Oncology

## 2015-11-15 VITALS — BP 92/59 | HR 85 | Temp 97.2°F | Wt 118.2 lb

## 2015-11-15 DIAGNOSIS — G894 Chronic pain syndrome: Secondary | ICD-10-CM | POA: Diagnosis not present

## 2015-11-15 DIAGNOSIS — I959 Hypotension, unspecified: Secondary | ICD-10-CM | POA: Insufficient documentation

## 2015-11-15 DIAGNOSIS — C01 Malignant neoplasm of base of tongue: Secondary | ICD-10-CM | POA: Insufficient documentation

## 2015-11-15 DIAGNOSIS — K219 Gastro-esophageal reflux disease without esophagitis: Secondary | ICD-10-CM | POA: Diagnosis not present

## 2015-11-15 DIAGNOSIS — Z87891 Personal history of nicotine dependence: Secondary | ICD-10-CM | POA: Insufficient documentation

## 2015-11-15 DIAGNOSIS — Z452 Encounter for adjustment and management of vascular access device: Secondary | ICD-10-CM | POA: Insufficient documentation

## 2015-11-15 DIAGNOSIS — Z931 Gastrostomy status: Secondary | ICD-10-CM

## 2015-11-15 DIAGNOSIS — D72829 Elevated white blood cell count, unspecified: Secondary | ICD-10-CM | POA: Insufficient documentation

## 2015-11-15 DIAGNOSIS — E7800 Pure hypercholesterolemia, unspecified: Secondary | ICD-10-CM | POA: Diagnosis not present

## 2015-11-15 DIAGNOSIS — Z79899 Other long term (current) drug therapy: Secondary | ICD-10-CM

## 2015-11-15 DIAGNOSIS — Z9981 Dependence on supplemental oxygen: Secondary | ICD-10-CM | POA: Diagnosis not present

## 2015-11-15 DIAGNOSIS — R634 Abnormal weight loss: Secondary | ICD-10-CM | POA: Insufficient documentation

## 2015-11-15 DIAGNOSIS — C029 Malignant neoplasm of tongue, unspecified: Secondary | ICD-10-CM

## 2015-11-15 DIAGNOSIS — J690 Pneumonitis due to inhalation of food and vomit: Secondary | ICD-10-CM | POA: Diagnosis not present

## 2015-11-15 DIAGNOSIS — R1310 Dysphagia, unspecified: Secondary | ICD-10-CM | POA: Insufficient documentation

## 2015-11-15 DIAGNOSIS — Z923 Personal history of irradiation: Secondary | ICD-10-CM | POA: Insufficient documentation

## 2015-11-15 DIAGNOSIS — Z85810 Personal history of malignant neoplasm of tongue: Secondary | ICD-10-CM | POA: Insufficient documentation

## 2015-11-15 DIAGNOSIS — D649 Anemia, unspecified: Secondary | ICD-10-CM | POA: Insufficient documentation

## 2015-11-15 DIAGNOSIS — Z431 Encounter for attention to gastrostomy: Secondary | ICD-10-CM | POA: Diagnosis not present

## 2015-11-15 DIAGNOSIS — Z9221 Personal history of antineoplastic chemotherapy: Secondary | ICD-10-CM | POA: Diagnosis not present

## 2015-11-15 DIAGNOSIS — E43 Unspecified severe protein-calorie malnutrition: Secondary | ICD-10-CM | POA: Diagnosis not present

## 2015-11-15 DIAGNOSIS — M1990 Unspecified osteoarthritis, unspecified site: Secondary | ICD-10-CM | POA: Diagnosis not present

## 2015-11-15 DIAGNOSIS — I10 Essential (primary) hypertension: Secondary | ICD-10-CM | POA: Diagnosis not present

## 2015-11-15 LAB — COMPREHENSIVE METABOLIC PANEL
ALT: 11 U/L — AB (ref 17–63)
AST: 16 U/L (ref 15–41)
Albumin: 2.1 g/dL — ABNORMAL LOW (ref 3.5–5.0)
Alkaline Phosphatase: 66 U/L (ref 38–126)
Anion gap: 4 — ABNORMAL LOW (ref 5–15)
BUN: 12 mg/dL (ref 6–20)
CHLORIDE: 96 mmol/L — AB (ref 101–111)
CO2: 30 mmol/L (ref 22–32)
CREATININE: 0.53 mg/dL — AB (ref 0.61–1.24)
Calcium: 8.5 mg/dL — ABNORMAL LOW (ref 8.9–10.3)
GFR calc Af Amer: >60 mL/min (ref >60)
GLUCOSE: 154 mg/dL — AB (ref 65–99)
Potassium: 4.1 mmol/L (ref 3.5–5.1)
SODIUM: 130 mmol/L — AB (ref 135–145)
Total Bilirubin: <0.1 mg/dL — ABNORMAL LOW (ref 0.3–1.2)
Total Protein: 6.7 g/dL (ref 6.5–8.1)

## 2015-11-15 LAB — CBC WITH DIFFERENTIAL/PLATELET
BASOS ABS: 0 10*3/uL (ref 0–0.1)
Basophils Relative: 0 %
EOS PCT: 1 %
Eosinophils Absolute: 0.1 10*3/uL (ref 0–0.7)
HCT: 27.7 % — ABNORMAL LOW (ref 40.0–52.0)
Hemoglobin: 8.7 g/dL — ABNORMAL LOW (ref 13.0–18.0)
LYMPHS PCT: 38 %
Lymphs Abs: 4.3 10*3/uL — ABNORMAL HIGH (ref 1.0–3.6)
MCH: 26.3 pg (ref 26.0–34.0)
MCHC: 31.3 g/dL — ABNORMAL LOW (ref 32.0–36.0)
MCV: 84.3 fL (ref 80.0–100.0)
Monocytes Absolute: 0.9 10*3/uL (ref 0.2–1.0)
Monocytes Relative: 8 %
Neutro Abs: 6 10*3/uL (ref 1.4–6.5)
Neutrophils Relative %: 53 %
PLATELETS: 577 10*3/uL — AB (ref 150–440)
RBC: 3.29 MIL/uL — AB (ref 4.40–5.90)
RDW: 19.8 % — ABNORMAL HIGH (ref 11.5–14.5)
WBC: 11.4 10*3/uL — AB (ref 3.8–10.6)

## 2015-11-15 LAB — MAGNESIUM: Magnesium: 1.9 mg/dL (ref 1.7–2.4)

## 2015-11-15 MED ORDER — OMEPRAZOLE 20 MG PO CPDR: 20.0000 mg | DELAYED_RELEASE_CAPSULE | Freq: Two times a day (BID) | ORAL | Status: DC

## 2015-11-15 MED ORDER — OXYCODONE HCL 30 MG PO TABS: ORAL_TABLET | ORAL | Status: DC

## 2015-11-15 NOTE — Progress Notes (Signed)
Patient requested refill for oxycodone.  Also states they cannot get the protonix and wants to know if they can get omeprazole instead.

## 2015-11-16 DIAGNOSIS — C029 Malignant neoplasm of tongue, unspecified: Secondary | ICD-10-CM | POA: Diagnosis not present

## 2015-11-16 DIAGNOSIS — Z1389 Encounter for screening for other disorder: Secondary | ICD-10-CM | POA: Diagnosis not present

## 2015-11-16 DIAGNOSIS — J690 Pneumonitis due to inhalation of food and vomit: Secondary | ICD-10-CM | POA: Diagnosis not present

## 2015-11-16 DIAGNOSIS — Z931 Gastrostomy status: Secondary | ICD-10-CM | POA: Diagnosis not present

## 2015-11-16 DIAGNOSIS — G4700 Insomnia, unspecified: Secondary | ICD-10-CM | POA: Diagnosis not present

## 2015-11-17 ENCOUNTER — Encounter: Payer: Self-pay | Admitting: Oncology

## 2015-11-17 NOTE — Progress Notes (Signed)
[place] @ [facility] [phone]  Fax:([phone]) [place] OB: 1956-08-21  MR#: [id number]  WER#:[id number]  Patient Care Team: Cyndi Bender, PA-C as PCP - General (Physician Assistant) Beverly Gust, MD (Unknown Physician Specialty)  CHIEF COMPLAINT:  Chief Complaint  Patient presents with  . Cancer of tongue   1.  Has a history of cancer of the tongue in 2006.  Patient underwent resection followed by radiation therapy 2.  Increasing difficulty in swallowing for last 2 or 3 weeks.  Patient had upper endoscopy done in December of 2015. 3.  Significant weight loss 4.  Abnormal PET scan (August of 2016) 5.  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  (Cis-platinum and radiation therapy) (September 2 016) T3 N0 M0 tumor Status post PEG tube placement (September, 2016) 6. Poor tolerance dose cis-platinum therapy, so patient has been switched over to cetuximab along with radiation therapy (October, 2016) 7.  Patient has finished cetuximab as well as radiation therapy by 23rd of November 2 016 8.  Patient was recently hospitalized with bilateral pneumonia, aspiration pneumonia and was transferred to rehabilitation (December, 2016)  VISIT DIAGNOSIS:  Significant weight loss. Difficulty swallowing. History of carcinoma of tongue      INTERVAL HISTORY:  Patient was recently discharged from the [facility], rehabilitation department. Patient and family was not too happy with the care provided there.  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  Patient requested refill for oxycodone. Also states they cannot get the protonix and wants to know if they can get omeprazole instead  Declining performance status  because of intercurrent infection  Patient started gaining weight Chest x-ray continues to show bilateral airspace disease.  REVIEW OF SYSTEMS:   Gen. status: Patient is feeling weak and tired.  Has lost significant weight Recently hospitalized with bilateral pneumonia Poor appetite patient is very depressed.  Losing weight. Increasing difficulty swallowing as mentioned in history of present illness HEENT: As mentioned in history of present illness patient had history of carcinoma of tongue status post radiation therapy recently having increasing soreness in the mouth. Lungs: Increasing cough shortness of breath yellowish expectoration no fever no hemoptysis GI: Persistent nausea as described above in history of present illness [place] close skeletal system no bony pain Lower extremity no swelling Skin: No rash Abdomen: Had a PEG tube placement  As per HPI. Otherwise, a complete review of systems is negatve.  PAST MEDICAL HISTORY: Carcinoma of tongue Hypertension Hypercholesterolemia Previous substance abuse Gastroesophageal reflux disease  PAST SURGICAL HISTORY: Treated for carcinoma of tongue FAMILY HISTORY There is no significant family history of breast cancer, ovarian cancer, colon cancer    ADVANCED DIRECTIVES:  Patient does not have any living will or healthcare power of attorney.  Information was given .  Available resources had been discussed.  We will follow-up on subsequent appointments regarding this issue  HEALTH MAINTENANCE: Social History  Substance Use Topics  . Smoking status: Former Smoker -- 1.00 packs/day for 0 years    Types: Cigarettes    Quit date: 09/21/2014  . Smokeless tobacco: None  . Alcohol Use: 7.2 oz/week    12 Cans of beer per week     Comment: beer/wine every day    Quit smoking  in November of 2015.  History of smoking for several years in the past.  No Known Allergies  Current Outpatient Prescriptions  Medication Sig Dispense Refill    . acidophilus (RISAQUAD) CAPS capsule Take 1 capsule by mouth at bedtime.     . Benzocaine (ANBESOL MT) 1 application by Other route as needed (for gum pain).    . chlorhexidine (PERIDEX) 0.12 % solution 15 mLs by Mouth Rinse route 2 (two) times daily. 120 mL 0  . citalopram (CELEXA) 20 MG tablet Take 1 tablet (20 mg total) by mouth daily. 30 tablet 3  . fentaNYL (DURAGESIC - DOSED MCG/HR) 50 MCG/HR Place 1 patch (50 mcg total) onto the skin every 3 (three) days. For pain. Remove old patch. Rotate sites. External use only 10 patch 0  . lidocaine-prilocaine (EMLA) cream Apply 1 application topically as needed. 30 g 3  . lisinopril (PRINIVIL,ZESTRIL) 10 MG tablet Take 10 mg by mouth at bedtime.     Marland Kitchen morphine 20 MG/5ML solution Take 1.3 mLs (5.2 mg total) by mouth every 6 (six) hours as needed for pain. 100 mL 0  . Nutritional Supplements (FEEDING SUPPLEMENT, OSMOLITE 1.5 CAL,) LIQD Place 240 mLs into feeding tube 5 (five) times daily. 150 Bottle 3  . nystatin (MYCOSTATIN) 100000 UNIT/ML suspension Take 5 mLs by mouth as needed (for thrush).    . ondansetron (ZOFRAN) 4 MG tablet Take 1 tablet (4 mg total) by mouth every 4 (four) hours as needed for nausea or vomiting. 45 tablet 0  . oxycodone (ROXICODONE) 30 MG immediate release tablet Take one tablet by mouth up to 7 (seven) times daily as needed for pain 210 tablet 0  . pantoprazole (PROTONIX) 40 MG tablet Take 1 tablet (40 mg total) by mouth 2 (two) times daily. 60 tablet 3  . phenol (CHLORASEPTIC) 1.4 % LIQD Use as directed 1 spray in the mouth or throat as needed for throat irritation / pain. 177 mL 0  . polyvinyl alcohol (LIQUIFILM TEARS) 1.4 % ophthalmic solution Place 1 drop into both eyes as needed for dry eyes. 15 mL 0  . pravastatin (PRAVACHOL) 40 MG tablet Take 40 mg by mouth at bedtime.     . promethazine (PHENERGAN) 25 MG suppository Place 1 suppository (25 mg total) rectally every 8 (eight) hours as needed for nausea or vomiting. 12  each 0  . sucralfate (CARAFATE) 1 GM/10ML suspension Take 10 mLs (1 g total) by mouth 4 (four) times daily -  with meals and at bedtime. 420 mL 0  . traZODone (DESYREL) 100 MG tablet Take 100 mg by mouth at bedtime.    Marland Kitchen amoxicillin (AMOXIL) 250 MG/5ML suspension     . omeprazole (PRILOSEC) 20 MG capsule Take 1 capsule (20 mg total) by mouth 2 (two) times daily before a meal. 60 capsule 3   No current facility-administered medications for this visit.   Facility-Administered Medications Ordered in Other Visits  Medication Dose Route Frequency Provider Last Rate Last Dose  . sodium chloride 0.9 % injection 10 mL  10 mL Intracatheter PRN Evlyn Kanner, NP   10 mL at 09/10/15 0948  . sodium chloride 0.9 % injection 10 mL  10 mL Intravenous PRN Forest Gleason, MD   10 mL at 10/24/15 1000    OBJECTIVE: PHYSICAL EXAM: Gen. status: Patient is seen lean and cachectic. Feeling extremely weak and tired.  This membranes are dry skin turgor poor In wheelchair.  Performance status is 3 HEENT: No soreness in  the mouth.  But swelling which is diffuse Lymphatic system: Supraclavicular, cervical, axillary, inguinal lymph nodes are not palpable Lungs: Bilateral rhonchi and occasional crepitation. Cardiac: Tachycardia Examination of the skin revealed no evidence of significant rashes, suspicious appearing nevi or other concerning lesions.. Abdominal exam revealed normal bowel sounds. The abdomen was soft, non-tender, and without masses, organomegaly, or appreciable enlargement of the abdominal aorta..   Peg  placement Psychiatric system: Depression and anxiety  Filed Vitals:   11/15/15 1438  BP: 92/59  Pulse: 85  Temp: 97.2 F (36.2 C)     Body mass index is 15.18 kg/(m^2).    ECOG FS:3 - Symptomatic, >50% confined to bed  LAB RESULTS:  Appointment on 11/15/2015  Component Date Value Ref Range Status  . WBC 11/15/2015 11.4* 3.8 - 10.6 K/uL Final  . RBC 11/15/2015 3.29* 4.40 - 5.90 MIL/uL Final    . Hemoglobin 11/15/2015 8.7* 13.0 - 18.0 g/dL Final  . HCT 11/15/2015 27.7* 40.0 - 52.0 % Final  . MCV 11/15/2015 84.3  80.0 - 100.0 fL Final  . MCH 11/15/2015 26.3  26.0 - 34.0 pg Final  . MCHC 11/15/2015 31.3* 32.0 - 36.0 g/dL Final  . RDW 11/15/2015 19.8* 11.5 - 14.5 % Final  . Platelets 11/15/2015 577* 150 - 440 K/uL Final  . Neutrophils Relative % 11/15/2015 53   Final  . Neutro Abs 11/15/2015 6.0  1.4 - 6.5 K/uL Final  . Lymphocytes Relative 11/15/2015 38   Final  . Lymphs Abs 11/15/2015 4.3* 1.0 - 3.6 K/uL Final  . Monocytes Relative 11/15/2015 8   Final  . Monocytes Absolute 11/15/2015 0.9  0.2 - 1.0 K/uL Final  . Eosinophils Relative 11/15/2015 1   Final  . Eosinophils Absolute 11/15/2015 0.1  0 - 0.7 K/uL Final  . Basophils Relative 11/15/2015 0   Final  . Basophils Absolute 11/15/2015 0.0  0 - 0.1 K/uL Final  . Sodium 11/15/2015 130* 135 - 145 mmol/L Final  . Potassium 11/15/2015 4.1  3.5 - 5.1 mmol/L Final  . Chloride 11/15/2015 96* 101 - 111 mmol/L Final  . CO2 11/15/2015 30  22 - 32 mmol/L Final  . Glucose, Bld 11/15/2015 154* 65 - 99 mg/dL Final  . BUN 11/15/2015 12  6 - 20 mg/dL Final  . Creatinine, Ser 11/15/2015 0.53* 0.61 - 1.24 mg/dL Final  . Calcium 11/15/2015 8.5* 8.9 - 10.3 mg/dL Final  . Total Protein 11/15/2015 6.7  6.5 - 8.1 g/dL Final  . Albumin 11/15/2015 2.1* 3.5 - 5.0 g/dL Final  . AST 11/15/2015 16  15 - 41 U/L Final  . ALT 11/15/2015 11* 17 - 63 U/L Final  . Alkaline Phosphatase 11/15/2015 66  38 - 126 U/L Final  . Total Bilirubin 11/15/2015 <0.1* 0.3 - 1.2 mg/dL Final  . GFR calc non Af Amer 11/15/2015 >60  >60 mL/min Final  . GFR calc Af Amer 11/15/2015 >60  >60 mL/min Final   Comment: (NOTE) The eGFR has been calculated using the CKD EPI equation. This calculation has not been validated in all clinical situations. eGFR's persistently <60 mL/min signify possible Chronic Kidney Disease.   . Anion gap 11/15/2015 4* 5 - 15 Final  . Magnesium  11/15/2015 1.9  1.7 - 2.4 mg/dL Final       ASSESSMENT:  A. 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  Carcinoma of tongue in 2006 status post resection and radiation therapy exit staging not known and old records being off pain for review 2.  Hypotension patient has been asked to be off antihypertensive medication 3.  Weight loss has been stabilized as patient is slowly increasing back to feeding\ 4.  Anemia which is multifactorial does not need any transfusion at present time 5.  Aspiration pneumonia chest x-ray has been reviewed Gradual improvement    PLAN:    Leucocytosis.  4.  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  Was advised not to refill that medication continue fentanyl patch and 5 tablets of oxycodone and we will refer patient back to the pain clinic once the general condition improves somewhat.  Overall very poor prognosis due to multiple factors Patient may benefit from ongoing physiotherapy.  Referred to home health  Services     Lab data has been reviewed        No matching staging information was found for the patient.  Forest Gleason, MD   11/17/2015 4:15 PM

## 2015-11-19 DIAGNOSIS — K219 Gastro-esophageal reflux disease without esophagitis: Secondary | ICD-10-CM | POA: Diagnosis not present

## 2015-11-19 DIAGNOSIS — J690 Pneumonitis due to inhalation of food and vomit: Secondary | ICD-10-CM | POA: Diagnosis not present

## 2015-11-19 DIAGNOSIS — Z431 Encounter for attention to gastrostomy: Secondary | ICD-10-CM | POA: Diagnosis not present

## 2015-11-19 DIAGNOSIS — Z9981 Dependence on supplemental oxygen: Secondary | ICD-10-CM | POA: Diagnosis not present

## 2015-11-19 DIAGNOSIS — M1990 Unspecified osteoarthritis, unspecified site: Secondary | ICD-10-CM | POA: Diagnosis not present

## 2015-11-19 DIAGNOSIS — G894 Chronic pain syndrome: Secondary | ICD-10-CM | POA: Diagnosis not present

## 2015-11-19 DIAGNOSIS — Z85810 Personal history of malignant neoplasm of tongue: Secondary | ICD-10-CM | POA: Diagnosis not present

## 2015-11-19 DIAGNOSIS — R1310 Dysphagia, unspecified: Secondary | ICD-10-CM | POA: Diagnosis not present

## 2015-11-19 DIAGNOSIS — E43 Unspecified severe protein-calorie malnutrition: Secondary | ICD-10-CM | POA: Diagnosis not present

## 2015-11-19 DIAGNOSIS — D649 Anemia, unspecified: Secondary | ICD-10-CM | POA: Diagnosis not present

## 2015-11-20 DIAGNOSIS — J690 Pneumonitis due to inhalation of food and vomit: Secondary | ICD-10-CM | POA: Diagnosis not present

## 2015-11-20 DIAGNOSIS — E43 Unspecified severe protein-calorie malnutrition: Secondary | ICD-10-CM | POA: Diagnosis not present

## 2015-11-20 DIAGNOSIS — R1310 Dysphagia, unspecified: Secondary | ICD-10-CM | POA: Diagnosis not present

## 2015-11-20 DIAGNOSIS — Z85810 Personal history of malignant neoplasm of tongue: Secondary | ICD-10-CM | POA: Diagnosis not present

## 2015-11-20 DIAGNOSIS — G894 Chronic pain syndrome: Secondary | ICD-10-CM | POA: Diagnosis not present

## 2015-11-20 DIAGNOSIS — M1990 Unspecified osteoarthritis, unspecified site: Secondary | ICD-10-CM | POA: Diagnosis not present

## 2015-11-20 DIAGNOSIS — Z431 Encounter for attention to gastrostomy: Secondary | ICD-10-CM | POA: Diagnosis not present

## 2015-11-20 DIAGNOSIS — Z9981 Dependence on supplemental oxygen: Secondary | ICD-10-CM | POA: Diagnosis not present

## 2015-11-20 DIAGNOSIS — D649 Anemia, unspecified: Secondary | ICD-10-CM | POA: Diagnosis not present

## 2015-11-20 DIAGNOSIS — K219 Gastro-esophageal reflux disease without esophagitis: Secondary | ICD-10-CM | POA: Diagnosis not present

## 2015-11-21 DIAGNOSIS — J690 Pneumonitis due to inhalation of food and vomit: Secondary | ICD-10-CM | POA: Diagnosis not present

## 2015-11-21 DIAGNOSIS — D649 Anemia, unspecified: Secondary | ICD-10-CM | POA: Diagnosis not present

## 2015-11-21 DIAGNOSIS — K219 Gastro-esophageal reflux disease without esophagitis: Secondary | ICD-10-CM | POA: Diagnosis not present

## 2015-11-21 DIAGNOSIS — R1310 Dysphagia, unspecified: Secondary | ICD-10-CM | POA: Diagnosis not present

## 2015-11-21 DIAGNOSIS — Z85810 Personal history of malignant neoplasm of tongue: Secondary | ICD-10-CM | POA: Diagnosis not present

## 2015-11-21 DIAGNOSIS — Z431 Encounter for attention to gastrostomy: Secondary | ICD-10-CM | POA: Diagnosis not present

## 2015-11-21 DIAGNOSIS — M1990 Unspecified osteoarthritis, unspecified site: Secondary | ICD-10-CM | POA: Diagnosis not present

## 2015-11-21 DIAGNOSIS — Z9981 Dependence on supplemental oxygen: Secondary | ICD-10-CM | POA: Diagnosis not present

## 2015-11-21 DIAGNOSIS — E43 Unspecified severe protein-calorie malnutrition: Secondary | ICD-10-CM | POA: Diagnosis not present

## 2015-11-21 DIAGNOSIS — G894 Chronic pain syndrome: Secondary | ICD-10-CM | POA: Diagnosis not present

## 2015-11-23 DIAGNOSIS — Z85810 Personal history of malignant neoplasm of tongue: Secondary | ICD-10-CM | POA: Diagnosis not present

## 2015-11-23 DIAGNOSIS — E43 Unspecified severe protein-calorie malnutrition: Secondary | ICD-10-CM | POA: Diagnosis not present

## 2015-11-23 DIAGNOSIS — K219 Gastro-esophageal reflux disease without esophagitis: Secondary | ICD-10-CM | POA: Diagnosis not present

## 2015-11-23 DIAGNOSIS — G894 Chronic pain syndrome: Secondary | ICD-10-CM | POA: Diagnosis not present

## 2015-11-23 DIAGNOSIS — D649 Anemia, unspecified: Secondary | ICD-10-CM | POA: Diagnosis not present

## 2015-11-23 DIAGNOSIS — Z9981 Dependence on supplemental oxygen: Secondary | ICD-10-CM | POA: Diagnosis not present

## 2015-11-23 DIAGNOSIS — R1310 Dysphagia, unspecified: Secondary | ICD-10-CM | POA: Diagnosis not present

## 2015-11-23 DIAGNOSIS — M1990 Unspecified osteoarthritis, unspecified site: Secondary | ICD-10-CM | POA: Diagnosis not present

## 2015-11-23 DIAGNOSIS — Z431 Encounter for attention to gastrostomy: Secondary | ICD-10-CM | POA: Diagnosis not present

## 2015-11-23 DIAGNOSIS — J690 Pneumonitis due to inhalation of food and vomit: Secondary | ICD-10-CM | POA: Diagnosis not present

## 2015-11-27 DIAGNOSIS — E43 Unspecified severe protein-calorie malnutrition: Secondary | ICD-10-CM | POA: Diagnosis not present

## 2015-11-27 DIAGNOSIS — G894 Chronic pain syndrome: Secondary | ICD-10-CM | POA: Diagnosis not present

## 2015-11-27 DIAGNOSIS — J690 Pneumonitis due to inhalation of food and vomit: Secondary | ICD-10-CM | POA: Diagnosis not present

## 2015-11-27 DIAGNOSIS — Z85810 Personal history of malignant neoplasm of tongue: Secondary | ICD-10-CM | POA: Diagnosis not present

## 2015-11-27 DIAGNOSIS — K219 Gastro-esophageal reflux disease without esophagitis: Secondary | ICD-10-CM | POA: Diagnosis not present

## 2015-11-27 DIAGNOSIS — D649 Anemia, unspecified: Secondary | ICD-10-CM | POA: Diagnosis not present

## 2015-11-27 DIAGNOSIS — Z431 Encounter for attention to gastrostomy: Secondary | ICD-10-CM | POA: Diagnosis not present

## 2015-11-27 DIAGNOSIS — Z9981 Dependence on supplemental oxygen: Secondary | ICD-10-CM | POA: Diagnosis not present

## 2015-11-27 DIAGNOSIS — R1310 Dysphagia, unspecified: Secondary | ICD-10-CM | POA: Diagnosis not present

## 2015-11-27 DIAGNOSIS — M1990 Unspecified osteoarthritis, unspecified site: Secondary | ICD-10-CM | POA: Diagnosis not present

## 2015-11-28 DIAGNOSIS — K219 Gastro-esophageal reflux disease without esophagitis: Secondary | ICD-10-CM | POA: Diagnosis not present

## 2015-11-28 DIAGNOSIS — Z9981 Dependence on supplemental oxygen: Secondary | ICD-10-CM | POA: Diagnosis not present

## 2015-11-28 DIAGNOSIS — Z85810 Personal history of malignant neoplasm of tongue: Secondary | ICD-10-CM | POA: Diagnosis not present

## 2015-11-28 DIAGNOSIS — R1310 Dysphagia, unspecified: Secondary | ICD-10-CM | POA: Diagnosis not present

## 2015-11-28 DIAGNOSIS — J690 Pneumonitis due to inhalation of food and vomit: Secondary | ICD-10-CM | POA: Diagnosis not present

## 2015-11-28 DIAGNOSIS — G894 Chronic pain syndrome: Secondary | ICD-10-CM | POA: Diagnosis not present

## 2015-11-28 DIAGNOSIS — M1990 Unspecified osteoarthritis, unspecified site: Secondary | ICD-10-CM | POA: Diagnosis not present

## 2015-11-28 DIAGNOSIS — Z431 Encounter for attention to gastrostomy: Secondary | ICD-10-CM | POA: Diagnosis not present

## 2015-11-28 DIAGNOSIS — E43 Unspecified severe protein-calorie malnutrition: Secondary | ICD-10-CM | POA: Diagnosis not present

## 2015-11-28 DIAGNOSIS — D649 Anemia, unspecified: Secondary | ICD-10-CM | POA: Diagnosis not present

## 2015-11-29 DIAGNOSIS — E46 Unspecified protein-calorie malnutrition: Secondary | ICD-10-CM | POA: Diagnosis not present

## 2015-11-29 DIAGNOSIS — D649 Anemia, unspecified: Secondary | ICD-10-CM | POA: Diagnosis not present

## 2015-11-29 DIAGNOSIS — C029 Malignant neoplasm of tongue, unspecified: Secondary | ICD-10-CM | POA: Diagnosis not present

## 2015-11-29 DIAGNOSIS — G894 Chronic pain syndrome: Secondary | ICD-10-CM | POA: Diagnosis not present

## 2015-11-29 DIAGNOSIS — E43 Unspecified severe protein-calorie malnutrition: Secondary | ICD-10-CM | POA: Diagnosis not present

## 2015-11-29 DIAGNOSIS — Z85810 Personal history of malignant neoplasm of tongue: Secondary | ICD-10-CM | POA: Diagnosis not present

## 2015-11-29 DIAGNOSIS — R1310 Dysphagia, unspecified: Secondary | ICD-10-CM | POA: Diagnosis not present

## 2015-11-29 DIAGNOSIS — J690 Pneumonitis due to inhalation of food and vomit: Secondary | ICD-10-CM | POA: Diagnosis not present

## 2015-11-29 DIAGNOSIS — Z9981 Dependence on supplemental oxygen: Secondary | ICD-10-CM | POA: Diagnosis not present

## 2015-11-29 DIAGNOSIS — M1990 Unspecified osteoarthritis, unspecified site: Secondary | ICD-10-CM | POA: Diagnosis not present

## 2015-11-29 DIAGNOSIS — M6281 Muscle weakness (generalized): Secondary | ICD-10-CM | POA: Diagnosis not present

## 2015-11-29 DIAGNOSIS — C028 Malignant neoplasm of overlapping sites of tongue: Secondary | ICD-10-CM | POA: Diagnosis not present

## 2015-11-29 DIAGNOSIS — K219 Gastro-esophageal reflux disease without esophagitis: Secondary | ICD-10-CM | POA: Diagnosis not present

## 2015-11-29 DIAGNOSIS — Z431 Encounter for attention to gastrostomy: Secondary | ICD-10-CM | POA: Diagnosis not present

## 2015-11-30 DIAGNOSIS — M1990 Unspecified osteoarthritis, unspecified site: Secondary | ICD-10-CM | POA: Diagnosis not present

## 2015-11-30 DIAGNOSIS — E43 Unspecified severe protein-calorie malnutrition: Secondary | ICD-10-CM | POA: Diagnosis not present

## 2015-11-30 DIAGNOSIS — K219 Gastro-esophageal reflux disease without esophagitis: Secondary | ICD-10-CM | POA: Diagnosis not present

## 2015-11-30 DIAGNOSIS — J690 Pneumonitis due to inhalation of food and vomit: Secondary | ICD-10-CM | POA: Diagnosis not present

## 2015-11-30 DIAGNOSIS — D649 Anemia, unspecified: Secondary | ICD-10-CM | POA: Diagnosis not present

## 2015-11-30 DIAGNOSIS — R1310 Dysphagia, unspecified: Secondary | ICD-10-CM | POA: Diagnosis not present

## 2015-11-30 DIAGNOSIS — Z85810 Personal history of malignant neoplasm of tongue: Secondary | ICD-10-CM | POA: Diagnosis not present

## 2015-11-30 DIAGNOSIS — Z9981 Dependence on supplemental oxygen: Secondary | ICD-10-CM | POA: Diagnosis not present

## 2015-11-30 DIAGNOSIS — Z431 Encounter for attention to gastrostomy: Secondary | ICD-10-CM | POA: Diagnosis not present

## 2015-11-30 DIAGNOSIS — G894 Chronic pain syndrome: Secondary | ICD-10-CM | POA: Diagnosis not present

## 2015-12-04 ENCOUNTER — Telehealth: Payer: Self-pay | Admitting: *Deleted

## 2015-12-04 DIAGNOSIS — G894 Chronic pain syndrome: Secondary | ICD-10-CM | POA: Diagnosis not present

## 2015-12-04 DIAGNOSIS — Z85810 Personal history of malignant neoplasm of tongue: Secondary | ICD-10-CM | POA: Diagnosis not present

## 2015-12-04 DIAGNOSIS — J690 Pneumonitis due to inhalation of food and vomit: Secondary | ICD-10-CM | POA: Diagnosis not present

## 2015-12-04 DIAGNOSIS — K219 Gastro-esophageal reflux disease without esophagitis: Secondary | ICD-10-CM | POA: Diagnosis not present

## 2015-12-04 DIAGNOSIS — E43 Unspecified severe protein-calorie malnutrition: Secondary | ICD-10-CM | POA: Diagnosis not present

## 2015-12-04 DIAGNOSIS — M1990 Unspecified osteoarthritis, unspecified site: Secondary | ICD-10-CM | POA: Diagnosis not present

## 2015-12-04 DIAGNOSIS — D649 Anemia, unspecified: Secondary | ICD-10-CM | POA: Diagnosis not present

## 2015-12-04 DIAGNOSIS — Z431 Encounter for attention to gastrostomy: Secondary | ICD-10-CM | POA: Diagnosis not present

## 2015-12-04 DIAGNOSIS — C029 Malignant neoplasm of tongue, unspecified: Secondary | ICD-10-CM

## 2015-12-04 DIAGNOSIS — R1310 Dysphagia, unspecified: Secondary | ICD-10-CM | POA: Diagnosis not present

## 2015-12-04 DIAGNOSIS — Z9981 Dependence on supplemental oxygen: Secondary | ICD-10-CM | POA: Diagnosis not present

## 2015-12-04 MED ORDER — OXYCODONE HCL 30 MG PO TABS: ORAL_TABLET | ORAL | Status: DC

## 2015-12-04 NOTE — Telephone Encounter (Signed)
Pt can get refill of oxycodone but pt is not due for refill until 2/2. Prescription will be written but post-dated to fill on 2/2. Prescription is ready for pick up at the registration desk.

## 2015-12-04 NOTE — Telephone Encounter (Signed)
Grant Lee, with [facility] states patient is almost out of Oxycodone 30 mg.  States he is having more throat pain.  Can he have a refill or does he need something additional.

## 2015-12-04 NOTE — Telephone Encounter (Signed)
Called patient to let him know that prescription is available for pick up, however, it cannot be filled until 12-13-15.  States he will be here on Thursday and will pick it up at that time.

## 2015-12-05 DIAGNOSIS — K219 Gastro-esophageal reflux disease without esophagitis: Secondary | ICD-10-CM | POA: Diagnosis not present

## 2015-12-05 DIAGNOSIS — E43 Unspecified severe protein-calorie malnutrition: Secondary | ICD-10-CM | POA: Diagnosis not present

## 2015-12-05 DIAGNOSIS — D649 Anemia, unspecified: Secondary | ICD-10-CM | POA: Diagnosis not present

## 2015-12-05 DIAGNOSIS — G894 Chronic pain syndrome: Secondary | ICD-10-CM | POA: Diagnosis not present

## 2015-12-05 DIAGNOSIS — Z85810 Personal history of malignant neoplasm of tongue: Secondary | ICD-10-CM | POA: Diagnosis not present

## 2015-12-05 DIAGNOSIS — Z431 Encounter for attention to gastrostomy: Secondary | ICD-10-CM | POA: Diagnosis not present

## 2015-12-05 DIAGNOSIS — R1310 Dysphagia, unspecified: Secondary | ICD-10-CM | POA: Diagnosis not present

## 2015-12-05 DIAGNOSIS — Z9981 Dependence on supplemental oxygen: Secondary | ICD-10-CM | POA: Diagnosis not present

## 2015-12-05 DIAGNOSIS — J690 Pneumonitis due to inhalation of food and vomit: Secondary | ICD-10-CM | POA: Diagnosis not present

## 2015-12-05 DIAGNOSIS — M1990 Unspecified osteoarthritis, unspecified site: Secondary | ICD-10-CM | POA: Diagnosis not present

## 2015-12-06 ENCOUNTER — Inpatient Hospital Stay: Payer: Medicare Other

## 2015-12-06 ENCOUNTER — Ambulatory Visit: Payer: Medicare Other | Admitting: Radiation Oncology

## 2015-12-06 DIAGNOSIS — E7800 Pure hypercholesterolemia, unspecified: Secondary | ICD-10-CM | POA: Diagnosis not present

## 2015-12-06 DIAGNOSIS — C801 Malignant (primary) neoplasm, unspecified: Secondary | ICD-10-CM

## 2015-12-06 DIAGNOSIS — Z85810 Personal history of malignant neoplasm of tongue: Secondary | ICD-10-CM | POA: Diagnosis not present

## 2015-12-06 DIAGNOSIS — R1310 Dysphagia, unspecified: Secondary | ICD-10-CM | POA: Diagnosis not present

## 2015-12-06 DIAGNOSIS — D649 Anemia, unspecified: Secondary | ICD-10-CM | POA: Diagnosis not present

## 2015-12-06 DIAGNOSIS — I10 Essential (primary) hypertension: Secondary | ICD-10-CM | POA: Diagnosis not present

## 2015-12-06 DIAGNOSIS — D72829 Elevated white blood cell count, unspecified: Secondary | ICD-10-CM | POA: Diagnosis not present

## 2015-12-06 DIAGNOSIS — Z923 Personal history of irradiation: Secondary | ICD-10-CM | POA: Diagnosis not present

## 2015-12-06 DIAGNOSIS — K219 Gastro-esophageal reflux disease without esophagitis: Secondary | ICD-10-CM | POA: Diagnosis not present

## 2015-12-06 DIAGNOSIS — Z79899 Other long term (current) drug therapy: Secondary | ICD-10-CM | POA: Diagnosis not present

## 2015-12-06 DIAGNOSIS — Z9221 Personal history of antineoplastic chemotherapy: Secondary | ICD-10-CM | POA: Diagnosis not present

## 2015-12-06 DIAGNOSIS — Z931 Gastrostomy status: Secondary | ICD-10-CM | POA: Diagnosis not present

## 2015-12-06 DIAGNOSIS — Z87891 Personal history of nicotine dependence: Secondary | ICD-10-CM | POA: Diagnosis not present

## 2015-12-06 DIAGNOSIS — C01 Malignant neoplasm of base of tongue: Secondary | ICD-10-CM | POA: Diagnosis not present

## 2015-12-06 DIAGNOSIS — I959 Hypotension, unspecified: Secondary | ICD-10-CM | POA: Diagnosis not present

## 2015-12-06 DIAGNOSIS — Z452 Encounter for adjustment and management of vascular access device: Secondary | ICD-10-CM | POA: Diagnosis not present

## 2015-12-06 MED ORDER — SODIUM CHLORIDE 0.9% FLUSH
10.0000 mL | INTRAVENOUS | Status: DC | PRN
  Administered 2015-12-06: 10 mL via INTRAVENOUS
  Filled 2015-12-06: qty 10

## 2015-12-06 MED ORDER — HEPARIN SOD (PORK) LOCK FLUSH 100 UNIT/ML IV SOLN
INTRAVENOUS | Status: AC
  Filled 2015-12-06: qty 5

## 2015-12-06 MED ORDER — HEPARIN SOD (PORK) LOCK FLUSH 100 UNIT/ML IV SOLN
500.0000 [IU] | Freq: Once | INTRAVENOUS | Status: AC
  Administered 2015-12-06: 500 [IU] via INTRAVENOUS

## 2015-12-06 NOTE — Progress Notes (Signed)
Survivorship Care Plan visit completed.  Treatment summary reviewed and given to patient.  ASCO answers booklet reviewed and given to patient.  CARE program and Cancer Transitions discussed with patient along with other resources cancer center offers to patients and caregivers.  Patient verbalized understanding.    

## 2015-12-12 DIAGNOSIS — Z9981 Dependence on supplemental oxygen: Secondary | ICD-10-CM | POA: Diagnosis not present

## 2015-12-12 DIAGNOSIS — G894 Chronic pain syndrome: Secondary | ICD-10-CM | POA: Diagnosis not present

## 2015-12-12 DIAGNOSIS — J690 Pneumonitis due to inhalation of food and vomit: Secondary | ICD-10-CM | POA: Diagnosis not present

## 2015-12-12 DIAGNOSIS — K219 Gastro-esophageal reflux disease without esophagitis: Secondary | ICD-10-CM | POA: Diagnosis not present

## 2015-12-12 DIAGNOSIS — Z431 Encounter for attention to gastrostomy: Secondary | ICD-10-CM | POA: Diagnosis not present

## 2015-12-12 DIAGNOSIS — E43 Unspecified severe protein-calorie malnutrition: Secondary | ICD-10-CM | POA: Diagnosis not present

## 2015-12-12 DIAGNOSIS — R1310 Dysphagia, unspecified: Secondary | ICD-10-CM | POA: Diagnosis not present

## 2015-12-12 DIAGNOSIS — D649 Anemia, unspecified: Secondary | ICD-10-CM | POA: Diagnosis not present

## 2015-12-12 DIAGNOSIS — Z85810 Personal history of malignant neoplasm of tongue: Secondary | ICD-10-CM | POA: Diagnosis not present

## 2015-12-12 DIAGNOSIS — M1990 Unspecified osteoarthritis, unspecified site: Secondary | ICD-10-CM | POA: Diagnosis not present

## 2015-12-13 ENCOUNTER — Ambulatory Visit
Admission: RE | Admit: 2015-12-13 | Discharge: 2015-12-13 | Disposition: A | Discharge status: Home / Self Care | Payer: Medicare Other | Source: Ambulatory Visit | Attending: Oncology | Admitting: Oncology

## 2015-12-13 ENCOUNTER — Inpatient Hospital Stay: Payer: Medicare Other

## 2015-12-13 ENCOUNTER — Inpatient Hospital Stay: Payer: Medicare Other | Attending: Oncology | Admitting: Oncology

## 2015-12-13 VITALS — BP 113/73 | HR 83 | Temp 98.2°F | Resp 18 | Wt 114.2 lb

## 2015-12-13 DIAGNOSIS — C029 Malignant neoplasm of tongue, unspecified: Secondary | ICD-10-CM

## 2015-12-13 DIAGNOSIS — Z79899 Other long term (current) drug therapy: Secondary | ICD-10-CM | POA: Diagnosis not present

## 2015-12-13 DIAGNOSIS — R634 Abnormal weight loss: Secondary | ICD-10-CM | POA: Insufficient documentation

## 2015-12-13 DIAGNOSIS — M1990 Unspecified osteoarthritis, unspecified site: Secondary | ICD-10-CM | POA: Diagnosis not present

## 2015-12-13 DIAGNOSIS — D649 Anemia, unspecified: Secondary | ICD-10-CM | POA: Diagnosis not present

## 2015-12-13 DIAGNOSIS — I959 Hypotension, unspecified: Secondary | ICD-10-CM | POA: Diagnosis not present

## 2015-12-13 DIAGNOSIS — R1310 Dysphagia, unspecified: Secondary | ICD-10-CM | POA: Insufficient documentation

## 2015-12-13 DIAGNOSIS — J690 Pneumonitis due to inhalation of food and vomit: Secondary | ICD-10-CM

## 2015-12-13 DIAGNOSIS — K219 Gastro-esophageal reflux disease without esophagitis: Secondary | ICD-10-CM | POA: Diagnosis not present

## 2015-12-13 DIAGNOSIS — G893 Neoplasm related pain (acute) (chronic): Secondary | ICD-10-CM | POA: Diagnosis not present

## 2015-12-13 DIAGNOSIS — Z85810 Personal history of malignant neoplasm of tongue: Secondary | ICD-10-CM | POA: Diagnosis not present

## 2015-12-13 DIAGNOSIS — Z87891 Personal history of nicotine dependence: Secondary | ICD-10-CM | POA: Diagnosis not present

## 2015-12-13 DIAGNOSIS — R918 Other nonspecific abnormal finding of lung field: Secondary | ICD-10-CM | POA: Diagnosis not present

## 2015-12-13 DIAGNOSIS — Z923 Personal history of irradiation: Secondary | ICD-10-CM | POA: Insufficient documentation

## 2015-12-13 DIAGNOSIS — E7800 Pure hypercholesterolemia, unspecified: Secondary | ICD-10-CM | POA: Diagnosis not present

## 2015-12-13 DIAGNOSIS — R0602 Shortness of breath: Secondary | ICD-10-CM | POA: Insufficient documentation

## 2015-12-13 DIAGNOSIS — I10 Essential (primary) hypertension: Secondary | ICD-10-CM | POA: Diagnosis not present

## 2015-12-13 DIAGNOSIS — R05 Cough: Secondary | ICD-10-CM | POA: Diagnosis not present

## 2015-12-13 DIAGNOSIS — Z931 Gastrostomy status: Secondary | ICD-10-CM

## 2015-12-13 DIAGNOSIS — C01 Malignant neoplasm of base of tongue: Secondary | ICD-10-CM | POA: Insufficient documentation

## 2015-12-13 LAB — COMPREHENSIVE METABOLIC PANEL
ALT: 13 U/L — AB (ref 17–63)
AST: 18 U/L (ref 15–41)
Albumin: 3.1 g/dL — ABNORMAL LOW (ref 3.5–5.0)
Alkaline Phosphatase: 62 U/L (ref 38–126)
Anion gap: 6 (ref 5–15)
BILIRUBIN TOTAL: 0.4 mg/dL (ref 0.3–1.2)
BUN: 16 mg/dL (ref 6–20)
CHLORIDE: 102 mmol/L (ref 101–111)
CO2: 24 mmol/L (ref 22–32)
CREATININE: 0.54 mg/dL — AB (ref 0.61–1.24)
Calcium: 8.7 mg/dL — ABNORMAL LOW (ref 8.9–10.3)
Glucose, Bld: 107 mg/dL — ABNORMAL HIGH (ref 65–99)
Potassium: 4.5 mmol/L (ref 3.5–5.1)
Sodium: 132 mmol/L — ABNORMAL LOW (ref 135–145)
TOTAL PROTEIN: 7.4 g/dL (ref 6.5–8.1)

## 2015-12-13 LAB — CBC WITH DIFFERENTIAL/PLATELET
Basophils Absolute: 0.1 10*3/uL (ref 0–0.1)
Basophils Relative: 1 %
EOS PCT: 1 %
Eosinophils Absolute: 0.1 10*3/uL (ref 0–0.7)
HCT: 31.3 % — ABNORMAL LOW (ref 40.0–52.0)
Hemoglobin: 10.1 g/dL — ABNORMAL LOW (ref 13.0–18.0)
LYMPHS ABS: 4.1 10*3/uL — AB (ref 1.0–3.6)
LYMPHS PCT: 35 %
MCH: 26.7 pg (ref 26.0–34.0)
MCHC: 32.2 g/dL (ref 32.0–36.0)
MCV: 83.1 fL (ref 80.0–100.0)
MONO ABS: 0.8 10*3/uL (ref 0.2–1.0)
Monocytes Relative: 7 %
Neutro Abs: 6.8 10*3/uL — ABNORMAL HIGH (ref 1.4–6.5)
Neutrophils Relative %: 56 %
PLATELETS: 375 10*3/uL (ref 150–440)
RBC: 3.77 MIL/uL — AB (ref 4.40–5.90)
RDW: 20.6 % — ABNORMAL HIGH (ref 11.5–14.5)
WBC: 11.8 10*3/uL — AB (ref 3.8–10.6)

## 2015-12-13 LAB — MAGNESIUM: MAGNESIUM: 2 mg/dL (ref 1.7–2.4)

## 2015-12-13 MED ORDER — SUCRALFATE 1 GM/10ML PO SUSP: 1.0000 g | Freq: Three times a day (TID) | ORAL | Status: DC

## 2015-12-13 MED ORDER — KETOROLAC TROMETHAMINE 15 MG/ML IJ SOLN
15.0000 mg | Freq: Once | INTRAMUSCULAR | Status: AC
  Administered 2015-12-13: 15 mg via INTRAMUSCULAR
  Filled 2015-12-13: qty 1

## 2015-12-13 NOTE — Progress Notes (Signed)
[place]  [phone]  [phone]     Grant Lee DOB: [birth year]  MR#: [id number]  WGY#:[id number]  Patient Care Team: Cyndi Bender, PA-C as PCP - General (Physician Assistant) Beverly Gust, MD (Unknown Physician Specialty)  CHIEF COMPLAINT:  Chief Complaint  Patient presents with  . Tongue Cancer    INTERVAL HISTORY:  Patient is here for further follow-up regarding left sided base of tongue cancer, diagnosed in September 2016. Patient has previous history of tongue cancer in 2006. Unsure if most recent is recurrent versus second primary. He started on chemotherapy, with cisplatin, and radiation therapy in September and completed in November. He also completed cetuximab in November 2016. He does have a PEG tube in place and was recently discharged from the hospital and rehabilitation facility with aspiration pneumonia. He reports using his pain medicine regularly approximately 7 pills per day. Dr. Oliva Bustard discontinued his morphine as well as fentanyl patches at last visit. He ran out of his oral oxycodone approximately Friday, January 27, and has had no pain medication except OTC Tylenol, aspirin, and abdomen reveals sense. He reports having a great deal of pain rating at 10 of 10 today. He also reports having increasing insomnia.  REVIEW OF SYSTEMS:   Review of Systems  Constitutional: Positive for malaise/fatigue. Negative for fever, chills, weight loss and diaphoresis.  HENT:       Dysphagia Pain  Eyes: Negative.   Respiratory: Positive for shortness of breath. Negative for cough, hemoptysis, sputum production and wheezing.   Cardiovascular: Negative for chest pain, palpitations, orthopnea, claudication, leg swelling and PND.  Gastrointestinal: Positive for nausea. Negative for heartburn, vomiting, abdominal pain, diarrhea, constipation, blood in stool and melena.  Genitourinary: Negative.   Musculoskeletal: Positive for back pain  and neck pain.  Skin: Negative.   Neurological: Positive for weakness. Negative for dizziness, tingling, focal weakness and seizures.  Endo/Heme/Allergies: Does not bruise/bleed easily.  Psychiatric/Behavioral: Negative for depression. The patient is nervous/anxious and has insomnia.     As per HPI. Otherwise, a complete review of systems is negatve.  ONCOLOGY HISTORY: Oncology History   1. Has a history of cancer of the tongue in 2006. Patient underwent resection followed by radiation therapy 2. Increasing difficulty in swallowing for last 2 or 3 weeks. Patient had upper endoscopy done in December of 2015. 3. Significant weight loss 4. Abnormal PET scan (August of 2016) 5. Recurrent versus second primary base of the tongue on the left side (unresectable) 6. Started on chemoradiation therapy. (Cis-platinum and radiation therapy) (September 2 016) T3 N0 M0 tumor 7. Status post PEG tube placement (September, 2016)     Cancer of tongue [facility])    Initial Diagnosis Cancer of tongue [facility])    PAST MEDICAL HISTORY: Past Medical History  Diagnosis Date  . Hypertension   . Pneumonia     2004  . GERD (gastroesophageal reflux disease)   . Arthritis   . Anemia     blood transfusion 2012  . Bleeding ulcer   . Machinery accident     LEG INJURY  . Cancer [facility])     mouth 2006 then again 2016    PAST SURGICAL HISTORY: Past Surgical History  Procedure Laterality Date  . Leg surgery    . Joint replacement      right hip  . Tongue biopsy    . Tongue surgery      multiple surgeries  . Direct laryngoscopy N/A 06/26/2015    Procedure: Laryngoscopy  with tongue base biopsy ;  Surgeon: Beverly Gust, MD;  Location: [facility] ORS;  Service: ENT;  Laterality: N/A;  . Esophagogastroduodenoscopy N/A 07/06/2015    Procedure: ESOPHAGOGASTRODUODENOSCOPY (EGD) and percutaneous gastrostomy tyube placement;  Surgeon: Lollie Sails, MD;  Location: [facility] ENDOSCOPY;  Service: Endoscopy;   Laterality: N/A;  Residual need to be done in the afternoon on 07/06/2015. Combined procedure with Dr. Gustavo Lah and Dr Rayann Heman  . Peripheral vascular catheterization N/A 07/27/2015    Procedure: Glori Luis Cath Insertion;  Surgeon: Katha Cabal, MD;  Location: [place] CV LAB;  Service: Cardiovascular;  Laterality: N/A;  . Esophagogastroduodenoscopy (egd) with propofol N/A 08/07/2015    Procedure: ESOPHAGOGASTRODUODENOSCOPY (EGD) WITH PROPOFOL;  Surgeon: Lollie Sails, MD;  Location: [facility] ENDOSCOPY;  Service: Endoscopy;  Laterality: N/A;  . Peg placement N/A 10/08/2015    Procedure: PERCUTANEOUS ENDOSCOPIC GASTROSTOMY (PEG) PLACEMENT;  Surgeon: Manya Silvas, MD;  Location: [facility] ENDOSCOPY;  Service: Endoscopy;  Laterality: N/A;    FAMILY HISTORY Family History  Problem Relation Age of Onset  . CAD Mother   . CAD Father     GYNECOLOGIC HISTORY:  No LMP for male patient.     ADVANCED DIRECTIVES:    HEALTH MAINTENANCE: Social History  Substance Use Topics  . Smoking status: Former Smoker -- 1.00 packs/day for 0 years    Types: Cigarettes    Quit date: 09/21/2014  . Smokeless tobacco: Not on file  . Alcohol Use: 7.2 oz/week    12 Cans of beer per week     Comment: beer/wine every day     Colonoscopy:  PAP:  Bone density:  Mammogram:  No Known Allergies  Current Outpatient Prescriptions  Medication Sig Dispense Refill  . acidophilus (RISAQUAD) CAPS capsule Take 1 capsule by mouth at bedtime.     . Benzocaine (ANBESOL MT) 1 application by Other route as needed (for gum pain).    . chlorhexidine (PERIDEX) 0.12 % solution 15 mLs by Mouth Rinse route 2 (two) times daily. 120 mL 0  . citalopram (CELEXA) 20 MG tablet Take 1 tablet (20 mg total) by mouth daily. 30 tablet 3  . lidocaine-prilocaine (EMLA) cream Apply 1 application topically as needed. 30 g 3  . lisinopril (PRINIVIL,ZESTRIL) 10 MG tablet Take 10 mg by mouth at bedtime.     . Nutritional Supplements (FEEDING  SUPPLEMENT, OSMOLITE 1.5 CAL,) LIQD Place 240 mLs into feeding tube 5 (five) times daily. 150 Bottle 3  . nystatin (MYCOSTATIN) 100000 UNIT/ML suspension Take 5 mLs by mouth as needed (for thrush).    Marland Kitchen omeprazole (PRILOSEC) 20 MG capsule Take 1 capsule (20 mg total) by mouth 2 (two) times daily before a meal. 60 capsule 3  . ondansetron (ZOFRAN) 4 MG tablet Take 1 tablet (4 mg total) by mouth every 4 (four) hours as needed for nausea or vomiting. 45 tablet 0  . oxycodone (ROXICODONE) 30 MG immediate release tablet Take one tablet by mouth up to 7 (seven) times daily as needed for pain 210 tablet 0  . pantoprazole (PROTONIX) 40 MG tablet Take 1 tablet (40 mg total) by mouth 2 (two) times daily. 60 tablet 3  . phenol (CHLORASEPTIC) 1.4 % LIQD Use as directed 1 spray in the mouth or throat as needed for throat irritation / pain. 177 mL 0  . polyvinyl alcohol (LIQUIFILM TEARS) 1.4 % ophthalmic solution Place 1 drop into both eyes as needed for dry eyes. 15 mL 0  . pravastatin (PRAVACHOL) 40 MG  tablet Take 40 mg by mouth at bedtime.     . promethazine (PHENERGAN) 25 MG suppository Place 1 suppository (25 mg total) rectally every 8 (eight) hours as needed for nausea or vomiting. 12 each 0  . sucralfate (CARAFATE) 1 GM/10ML suspension Take 10 mLs (1 g total) by mouth 4 (four) times daily -  with meals and at bedtime. 420 mL 0  . traZODone (DESYREL) 100 MG tablet Take 100 mg by mouth at bedtime.     Current Facility-Administered Medications  Medication Dose Route Frequency Provider Last Rate Last Dose  . ketorolac (TORADOL) 15 MG/ML injection 15 mg  15 mg Intramuscular Once Evlyn Kanner, NP       Facility-Administered Medications Ordered in Other Visits  Medication Dose Route Frequency Provider Last Rate Last Dose  . sodium chloride 0.9 % injection 10 mL  10 mL Intracatheter PRN Evlyn Kanner, NP   10 mL at 09/10/15 0948  . sodium chloride 0.9 % injection 10 mL  10 mL Intravenous PRN Forest Gleason, MD   10 mL at 10/24/15 1000    OBJECTIVE: BP 113/73 mmHg  Pulse 83  Temp(Src) 98.2 F (36.8 C) (Tympanic)  Resp 18  Wt 114 lb 3 oz (51.795 kg)   Body mass index is 14.65 kg/(m^2).    ECOG FS:2 - Symptomatic, <50% confined to bed  General: Cachectic, sitting in wheelchair, in no acute distress. Eyes: Pink conjunctiva, anicteric sclera. HEENT: Normocephalic, moist mucous membranes, clear oropharnyx. Lungs: Clear to auscultation bilaterally. Heart: Regular rate and rhythm. No rubs, murmurs, or gallops. Abdomen: PEG tube placement. Soft, nontender, nondistended. No organomegaly noted, normoactive bowel sounds. Musculoskeletal: No edema, cyanosis, or clubbing. Neuro: Alert, answering all questions appropriately. Cranial nerves grossly intact. Skin: No rashes or petechiae noted. Psych: Normal affect. Lymphatics: No cervical, clavicular, or axillary LAD.   LAB RESULTS:  Appointment on 12/13/2015  Component Date Value Ref Range Status  . WBC 12/13/2015 11.8* 3.8 - 10.6 K/uL Final  . RBC 12/13/2015 3.77* 4.40 - 5.90 MIL/uL Final  . Hemoglobin 12/13/2015 10.1* 13.0 - 18.0 g/dL Final  . HCT 12/13/2015 31.3* 40.0 - 52.0 % Final  . MCV 12/13/2015 83.1  80.0 - 100.0 fL Final  . MCH 12/13/2015 26.7  26.0 - 34.0 pg Final  . MCHC 12/13/2015 32.2  32.0 - 36.0 g/dL Final  . RDW 12/13/2015 20.6* 11.5 - 14.5 % Final  . Platelets 12/13/2015 375  150 - 440 K/uL Final  . Neutrophils Relative % 12/13/2015 56   Final  . Neutro Abs 12/13/2015 6.8* 1.4 - 6.5 K/uL Final  . Lymphocytes Relative 12/13/2015 35   Final  . Lymphs Abs 12/13/2015 4.1* 1.0 - 3.6 K/uL Final  . Monocytes Relative 12/13/2015 7   Final  . Monocytes Absolute 12/13/2015 0.8  0.2 - 1.0 K/uL Final  . Eosinophils Relative 12/13/2015 1   Final  . Eosinophils Absolute 12/13/2015 0.1  0 - 0.7 K/uL Final  . Basophils Relative 12/13/2015 1   Final  . Basophils Absolute 12/13/2015 0.1  0 - 0.1 K/uL Final  . Sodium 12/13/2015 132*  135 - 145 mmol/L Final  . Potassium 12/13/2015 4.5  3.5 - 5.1 mmol/L Final  . Chloride 12/13/2015 102  101 - 111 mmol/L Final  . CO2 12/13/2015 24  22 - 32 mmol/L Final  . Glucose, Bld 12/13/2015 107* 65 - 99 mg/dL Final  . BUN 12/13/2015 16  6 - 20 mg/dL Final  . Creatinine, Ser  12/13/2015 0.54* 0.61 - 1.24 mg/dL Final  . Calcium 12/13/2015 8.7* 8.9 - 10.3 mg/dL Final  . Total Protein 12/13/2015 7.4  6.5 - 8.1 g/dL Final  . Albumin 12/13/2015 3.1* 3.5 - 5.0 g/dL Final  . AST 12/13/2015 18  15 - 41 U/L Final  . ALT 12/13/2015 13* 17 - 63 U/L Final  . Alkaline Phosphatase 12/13/2015 62  38 - 126 U/L Final  . Total Bilirubin 12/13/2015 0.4  0.3 - 1.2 mg/dL Final  . GFR calc non Af Amer 12/13/2015 >60  >60 mL/min Final  . GFR calc Af Amer 12/13/2015 >60  >60 mL/min Final   Comment: (NOTE) The eGFR has been calculated using the CKD EPI equation. This calculation has not been validated in all clinical situations. eGFR's persistently <60 mL/min signify possible Chronic Kidney Disease.   . Anion gap 12/13/2015 6  5 - 15 Final  . Magnesium 12/13/2015 2.0  1.7 - 2.4 mg/dL Final    STUDIES: No results found.  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  PLAN:   1. Base of tongue cancer on left. Patient is status post chemoradiation with cisplatin. Patient was switched over to cetuximab with radiation therapy due to poor tolerance. Radiation and cetuximab were completed in November 2016. Patient did recently discharged from the hospital with bilateral aspiration pneumonia. Was discharged to rehabilitation facility and now is home. 2. Dysphagia with associated pain. Patient continues to report uncontrolled pain as well as inability to swallow foods. Currently using the PEG tube for feedings.  Chest x-ray from today results have been reviewed. Dr. Oliva Bustard had previously discontinued his morphine as well as fentanyl patches upon discharge from rehabilitation facility. He is currently on oxycodone 5 mg and reports taking 7 tablets per day. However he ran out of his last prescription on 12/07/2015. Refill was given last week but is not refillable until 12/14/2015.  We will administer 15 mg of Toradol IM for pain management today. We'll schedule patient for a swallow eval to determine if he is safe to have oral food and liquids. We'll also plan for patient to have a restaging PET scan following completion of chemotherapy and radiation for reevaluation of disease. He will then follow-up with Dr. Oliva Bustard in approximately 3 months. Following this appointment patient will be referred back to previous pain clinic that he was established with.  Patient expressed understanding and was in agreement with this plan. He also understands that He can call clinic at any time with any questions, concerns, or complaints.   Dr. Oliva Bustard was available for consultation and review of plan of care for this patient.   Evlyn Kanner, NP   12/13/2015 2:29 PM

## 2015-12-13 NOTE — Progress Notes (Signed)
Patient states he is in a lot of pain and cannot get his prescription filled until tomorrow. Wants to know if he can get pain medication here today.  Patient had CXR prior to appointment today.  Wants to know when he will have repeat scan to see if his cancer is gone.  States he is not sleeping due to his pain and has not slept but about 10 hours in the last 3 days.

## 2015-12-14 ENCOUNTER — Telehealth: Payer: Self-pay | Admitting: *Deleted

## 2015-12-14 NOTE — Telephone Encounter (Signed)
Grant Lee states pharmacy has lost original prescription and will need our office to fax a duplicate prescription for oxycodone '30mg'$  tablets #210 dated 11/14/14 to Grant Lee to keep on record for audit purposes. Informed Grant Lee, will fax to pharmacy on Monday. Grant Lee verbalized understanding.

## 2015-12-18 ENCOUNTER — Ambulatory Visit
Admission: RE | Admit: 2015-12-18 | Discharge: 2015-12-18 | Disposition: A | Discharge status: Home / Self Care | Payer: Medicare Other | Source: Ambulatory Visit | Attending: Family Medicine | Admitting: Family Medicine

## 2015-12-18 ENCOUNTER — Other Ambulatory Visit: Payer: Self-pay | Admitting: Family Medicine

## 2015-12-18 DIAGNOSIS — Z8589 Personal history of malignant neoplasm of other organs and systems: Secondary | ICD-10-CM | POA: Diagnosis not present

## 2015-12-18 DIAGNOSIS — J690 Pneumonitis due to inhalation of food and vomit: Secondary | ICD-10-CM

## 2015-12-18 DIAGNOSIS — G893 Neoplasm related pain (acute) (chronic): Secondary | ICD-10-CM | POA: Insufficient documentation

## 2015-12-18 DIAGNOSIS — E46 Unspecified protein-calorie malnutrition: Secondary | ICD-10-CM

## 2015-12-26 ENCOUNTER — Ambulatory Visit
Admission: RE | Admit: 2015-12-26 | Discharge: 2015-12-26 | Disposition: A | Discharge status: Home / Self Care | Payer: Medicare Other | Source: Ambulatory Visit | Attending: Family Medicine | Admitting: Family Medicine

## 2015-12-26 DIAGNOSIS — R1313 Dysphagia, pharyngeal phase: Secondary | ICD-10-CM

## 2015-12-26 DIAGNOSIS — J690 Pneumonitis due to inhalation of food and vomit: Secondary | ICD-10-CM | POA: Insufficient documentation

## 2015-12-26 DIAGNOSIS — E46 Unspecified protein-calorie malnutrition: Secondary | ICD-10-CM | POA: Insufficient documentation

## 2015-12-26 NOTE — Therapy (Signed)
[place], [place], [address] Phone: [phone]   Fax:     Modified Barium Swallow  Patient Details  Name: Grant Lee MRN: [id number] Date of Birth: 1956-07-17 No Data Recorded  Encounter Date: 12/26/2015      End of Session - 12/26/15 1444    Visit Number 1   Number of Visits 1   Date for SLP Re-Evaluation 12/26/15   SLP Start Time 1245   SLP Stop Time  1340   SLP Time Calculation (min) 55 min   Activity Tolerance Patient tolerated treatment well      Past Medical History  Diagnosis Date  . Hypertension   . Pneumonia     2004  . GERD (gastroesophageal reflux disease)   . Arthritis   . Anemia     blood transfusion 2012  . Bleeding ulcer   . Machinery accident     LEG INJURY  . Cancer [facility])     mouth 2006 then again 2016    Past Surgical History  Procedure Laterality Date  . Leg surgery    . Joint replacement      right hip  . Tongue biopsy    . Tongue surgery      multiple surgeries  . Direct laryngoscopy N/A 06/26/2015    Procedure: Laryngoscopy with tongue base biopsy ;  Surgeon: Beverly Gust, MD;  Location: [facility] ORS;  Service: ENT;  Laterality: N/A;  . Esophagogastroduodenoscopy N/A 07/06/2015    Procedure: ESOPHAGOGASTRODUODENOSCOPY (EGD) and percutaneous gastrostomy tyube placement;  Surgeon: Lollie Sails, MD;  Location: [facility] ENDOSCOPY;  Service: Endoscopy;  Laterality: N/A;  Residual need to be done in the afternoon on 07/06/2015. Combined procedure with Dr. Gustavo Lah and Dr Rayann Heman  . Peripheral vascular catheterization N/A 07/27/2015    Procedure: Glori Luis Cath Insertion;  Surgeon: Katha Cabal, MD;  Location: [place] CV LAB;  Service: Cardiovascular;  Laterality: N/A;  . Esophagogastroduodenoscopy (egd) with propofol N/A 08/07/2015    Procedure: ESOPHAGOGASTRODUODENOSCOPY (EGD) WITH PROPOFOL;  Surgeon: Lollie Sails, MD;  Location: [facility] ENDOSCOPY;   Service: Endoscopy;  Laterality: N/A;  . Peg placement N/A 10/08/2015    Procedure: PERCUTANEOUS ENDOSCOPIC GASTROSTOMY (PEG) PLACEMENT;  Surgeon: Manya Silvas, MD;  Location[facility] END[facility]COPY;  Service: Endoscopy;  Laterality: N/A;    There were no vitals filed for this visit.  Visit Diagnosis: Pharyngeal dysphagia  Malnutrition ([place]) - Plan: DG OP Swallowing Func-Medicare/Speech Path, DG OP Swallowing Func-Medicare/Speech Path  Aspiration pneumonitis ([facility]) - Plan: DG OP Swallowing Func-Medicare/Speech Path, DG OP Swallowing Func-Medicare/Speech Path   Subjective: Patient behavior: (alertness, ability to follow instructions, etc.): Patient able to state his swallowing history and follow directions.  Chief complaint: history of bilateral pneumonia (09/2015), radiation treatment completed 09/2015, PEG placement (07/2015), left tongue cancer (diagnosed 06/2015), and history of right tongue cancer 2006 (treated with surgery and radiation)   Objective:  Radiological Procedure: A videoflouroscopic evaluation of oral-preparatory, reflex initiation, and pharyngeal phases of the swallow was performed; as well as a screening of the upper esophageal phase.  I. POSTURE: Upright in MBS chair  II. VIEW: Lateral  III. COMPENSATORY STRATEGIES: N/A IV. BOLUSES ADMINISTERED:    Puree: 1/2 teaspoon bolus   V. RESULTS OF EVALUATION: A. ORAL PREPARATORY PHASE: (The lips, tongue, and velum are observed for strength and coordination) WFL       **Overall Severity Rating:  B. SWALLOW INITIATION/REFLEX: (The reflex is normal if "triggered" by  the time the bolus reached the base of the tongue) At pyriform sinuses  **Overall Severity Rating: Severe  C. PHARYNGEAL PHASE: (Pharyngeal function is normal if the bolus shows rapid, smooth, and continuous transit through the pharynx and there is no pharyngeal residue after the swallow): reduced hyolaryngeal movement, reduced pharyngeal pressure generation,  reduced laryngeal vestibule closure, reduced amplitude/duration of UES opening  **Overall Severity Rating: Severe  D. LARYNGEAL PENETRATION: (Material entering into the laryngeal inlet/vestibule but not aspirated)   E. ASPIRATION: Aspiration of pyriform sinus residue  F. ESOPHAGEAL PHASE: (Screening of the upper esophagus) N/A  ASSESSMENT: 60 year old man; with history of bilateral pneumonia (09/2015), radiation treatment completed 09/2015, PEG placement (07/2015), left tongue cancer (diagnosed 06/2015), and history of right tongue cancer 2006 (treated with surgery and radiation); is presenting with severe pharyngeal dysphagia.  The pharyngeal swallow is delayed, triggering after the bolus falls to the pyriform sinuses.  There is poor hyolaryngeal movement with decreased duration/amplitude of UES opening.  A minimal portion of the bolus enters the esophagus and there is aspiration of pyriform sinus residue.  The patient had an immediate cough and hawk response, which cleared the barium from the trachea and laryngeal vestibule. The patient is not safe to resume POs.  He was given written and verbal teaching regarding exercises to improve hyolaryngeal movement,  Tongue base retraction, and UES opening.     PLAN/RECOMMENDATIONS:   A. Diet: NPO   B. Swallowing Precautions: NPO   C. Recommended consultation to N/A   D. Therapy recommendations: Patient given swallowing exercises   E. Results and recommendations were discussed with the patient and his wife immediately following the study and the final report will be routed to the referring MD.          G-Codes - 01-12-16 1445    Functional Assessment Tool Used MBS, clinical judgment   Functional Limitations Swallowing   Swallow Current Status ([id number]) At least 80 percent but less than 100 percent impaired, limited or restricted   Swallow Goal Status (Q5956) At least 80 percent but less than 100 percent impaired, limited or restricted    Swallow Discharge Status [phone]) At least 80 percent but less than 100 percent impaired, limited or restricted          Problem List Patient Active Problem List   Diagnosis Date Noted  . Pneumonia 10/02/2015  . Malignant neoplasm of tongue ([place]) 08/30/2015  . PUD (peptic ulcer disease) 08/08/2015  . Acute esophagitis 08/08/2015  . Chronic pain syndrome 08/08/2015  . Esophagitis 08/08/2015  . Chemotherapy induced nausea and vomiting 08/03/2015  . N&V (nausea and vomiting) 08/03/2015  . Cancer of tongue ([place])   . Protein-calorie malnutrition, severe ([place]) 07/05/2015  . Severe protein-calorie malnutrition [place]: less than 60% of standard weight) ([place]) 07/05/2015  . Malnutrition ([place]) 07/03/2015  . Aspiration pneumonitis ([place]) 07/03/2015  . Pneumonitis due to inhalation of food or vomitus ([place]) 07/03/2015  . DU (duodenal ulcer) 06/11/2015  . Tongue cancer ([place]) 06/11/2015  . Duodenal ulcer without hemorrhage or perforation 06/11/2015  . Abnormal loss of weight 10/09/2014    Lou Miner 01/12/2016, 2:46 PM  [facility] [place], [place], [address] Phone: [phone]   Fax:     Name: Grant Lee MRN: [id number] Date of Birth: 1956-08-11

## 2015-12-27 DIAGNOSIS — Z431 Encounter for attention to gastrostomy: Secondary | ICD-10-CM | POA: Diagnosis not present

## 2015-12-27 DIAGNOSIS — J690 Pneumonitis due to inhalation of food and vomit: Secondary | ICD-10-CM | POA: Diagnosis not present

## 2015-12-27 DIAGNOSIS — Z9981 Dependence on supplemental oxygen: Secondary | ICD-10-CM | POA: Diagnosis not present

## 2015-12-27 DIAGNOSIS — E43 Unspecified severe protein-calorie malnutrition: Secondary | ICD-10-CM | POA: Diagnosis not present

## 2015-12-27 DIAGNOSIS — G894 Chronic pain syndrome: Secondary | ICD-10-CM | POA: Diagnosis not present

## 2015-12-27 DIAGNOSIS — K219 Gastro-esophageal reflux disease without esophagitis: Secondary | ICD-10-CM | POA: Diagnosis not present

## 2015-12-27 DIAGNOSIS — D649 Anemia, unspecified: Secondary | ICD-10-CM | POA: Diagnosis not present

## 2015-12-27 DIAGNOSIS — Z85810 Personal history of malignant neoplasm of tongue: Secondary | ICD-10-CM | POA: Diagnosis not present

## 2015-12-27 DIAGNOSIS — R1310 Dysphagia, unspecified: Secondary | ICD-10-CM | POA: Diagnosis not present

## 2015-12-27 DIAGNOSIS — M1990 Unspecified osteoarthritis, unspecified site: Secondary | ICD-10-CM | POA: Diagnosis not present

## 2015-12-28 DIAGNOSIS — R1310 Dysphagia, unspecified: Secondary | ICD-10-CM | POA: Diagnosis not present

## 2015-12-28 DIAGNOSIS — C028 Malignant neoplasm of overlapping sites of tongue: Secondary | ICD-10-CM | POA: Diagnosis not present

## 2015-12-28 DIAGNOSIS — Z85810 Personal history of malignant neoplasm of tongue: Secondary | ICD-10-CM | POA: Diagnosis not present

## 2015-12-28 DIAGNOSIS — E46 Unspecified protein-calorie malnutrition: Secondary | ICD-10-CM | POA: Diagnosis not present

## 2015-12-28 DIAGNOSIS — C029 Malignant neoplasm of tongue, unspecified: Secondary | ICD-10-CM | POA: Diagnosis not present

## 2015-12-28 DIAGNOSIS — M6281 Muscle weakness (generalized): Secondary | ICD-10-CM | POA: Diagnosis not present

## 2015-12-30 DIAGNOSIS — R1310 Dysphagia, unspecified: Secondary | ICD-10-CM | POA: Diagnosis not present

## 2015-12-30 DIAGNOSIS — M6281 Muscle weakness (generalized): Secondary | ICD-10-CM | POA: Diagnosis not present

## 2015-12-31 ENCOUNTER — Telehealth: Payer: Self-pay | Admitting: *Deleted

## 2015-12-31 DIAGNOSIS — R05 Cough: Secondary | ICD-10-CM

## 2015-12-31 DIAGNOSIS — R059 Cough, unspecified: Secondary | ICD-10-CM

## 2015-12-31 DIAGNOSIS — J189 Pneumonia, unspecified organism: Secondary | ICD-10-CM

## 2015-12-31 NOTE — Telephone Encounter (Signed)
Thinks he has pneumonia again. His chest is congested and he is coughing up green sputum. His throat feels raw. Asking if he can be seen tomorrow. Denies fever

## 2015-12-31 NOTE — Telephone Encounter (Signed)
Per Dr Oliva Bustard, CXR , CBC, CMP, see md tomorrow. Agrees to come at 1030 for CXR then 1130 ofr lab 1145 md

## 2016-01-01 ENCOUNTER — Encounter: Payer: Self-pay | Admitting: Oncology

## 2016-01-01 ENCOUNTER — Inpatient Hospital Stay: Payer: Medicare Other

## 2016-01-01 ENCOUNTER — Inpatient Hospital Stay (HOSPITAL_BASED_OUTPATIENT_CLINIC_OR_DEPARTMENT_OTHER): Payer: Medicare Other | Admitting: Oncology

## 2016-01-01 ENCOUNTER — Ambulatory Visit
Admission: RE | Admit: 2016-01-01 | Discharge: 2016-01-01 | Disposition: A | Discharge status: Home / Self Care | Payer: Medicare Other | Source: Ambulatory Visit | Attending: Oncology | Admitting: Oncology

## 2016-01-01 VITALS — BP 107/74 | HR 93 | Temp 97.7°F | Wt 124.0 lb

## 2016-01-01 DIAGNOSIS — J189 Pneumonia, unspecified organism: Secondary | ICD-10-CM

## 2016-01-01 DIAGNOSIS — E7800 Pure hypercholesterolemia, unspecified: Secondary | ICD-10-CM | POA: Diagnosis not present

## 2016-01-01 DIAGNOSIS — Z923 Personal history of irradiation: Secondary | ICD-10-CM | POA: Diagnosis not present

## 2016-01-01 DIAGNOSIS — R059 Cough, unspecified: Secondary | ICD-10-CM

## 2016-01-01 DIAGNOSIS — R1310 Dysphagia, unspecified: Secondary | ICD-10-CM

## 2016-01-01 DIAGNOSIS — R634 Abnormal weight loss: Secondary | ICD-10-CM | POA: Diagnosis not present

## 2016-01-01 DIAGNOSIS — Z79899 Other long term (current) drug therapy: Secondary | ICD-10-CM | POA: Diagnosis not present

## 2016-01-01 DIAGNOSIS — C01 Malignant neoplasm of base of tongue: Secondary | ICD-10-CM

## 2016-01-01 DIAGNOSIS — J439 Emphysema, unspecified: Secondary | ICD-10-CM | POA: Insufficient documentation

## 2016-01-01 DIAGNOSIS — G893 Neoplasm related pain (acute) (chronic): Secondary | ICD-10-CM

## 2016-01-01 DIAGNOSIS — Z9889 Other specified postprocedural states: Secondary | ICD-10-CM | POA: Insufficient documentation

## 2016-01-01 DIAGNOSIS — R918 Other nonspecific abnormal finding of lung field: Secondary | ICD-10-CM | POA: Diagnosis not present

## 2016-01-01 DIAGNOSIS — I959 Hypotension, unspecified: Secondary | ICD-10-CM | POA: Diagnosis not present

## 2016-01-01 DIAGNOSIS — M1990 Unspecified osteoarthritis, unspecified site: Secondary | ICD-10-CM | POA: Diagnosis not present

## 2016-01-01 DIAGNOSIS — J90 Pleural effusion, not elsewhere classified: Secondary | ICD-10-CM | POA: Diagnosis not present

## 2016-01-01 DIAGNOSIS — C029 Malignant neoplasm of tongue, unspecified: Secondary | ICD-10-CM

## 2016-01-01 DIAGNOSIS — R05 Cough: Secondary | ICD-10-CM

## 2016-01-01 DIAGNOSIS — D649 Anemia, unspecified: Secondary | ICD-10-CM

## 2016-01-01 DIAGNOSIS — Z931 Gastrostomy status: Secondary | ICD-10-CM | POA: Diagnosis not present

## 2016-01-01 DIAGNOSIS — I10 Essential (primary) hypertension: Secondary | ICD-10-CM | POA: Diagnosis not present

## 2016-01-01 DIAGNOSIS — Z85810 Personal history of malignant neoplasm of tongue: Secondary | ICD-10-CM

## 2016-01-01 DIAGNOSIS — Z87891 Personal history of nicotine dependence: Secondary | ICD-10-CM | POA: Diagnosis not present

## 2016-01-01 DIAGNOSIS — K219 Gastro-esophageal reflux disease without esophagitis: Secondary | ICD-10-CM | POA: Diagnosis not present

## 2016-01-01 LAB — CBC WITH DIFFERENTIAL/PLATELET
Basophils Absolute: 0 10*3/uL (ref 0–0.1)
Basophils Relative: 0 %
EOS ABS: 0.1 10*3/uL (ref 0–0.7)
EOS PCT: 1 %
HCT: 30.1 % — ABNORMAL LOW (ref 40.0–52.0)
HEMOGLOBIN: 10 g/dL — AB (ref 13.0–18.0)
LYMPHS ABS: 3.1 10*3/uL (ref 1.0–3.6)
LYMPHS PCT: 32 %
MCH: 27.4 pg (ref 26.0–34.0)
MCHC: 33.1 g/dL (ref 32.0–36.0)
MCV: 82.8 fL (ref 80.0–100.0)
Monocytes Absolute: 0.9 10*3/uL (ref 0.2–1.0)
Monocytes Relative: 10 %
NEUTROS PCT: 57 %
Neutro Abs: 5.4 10*3/uL (ref 1.4–6.5)
PLATELETS: 484 10*3/uL — AB (ref 150–440)
RBC: 3.64 MIL/uL — AB (ref 4.40–5.90)
RDW: 20.9 % — ABNORMAL HIGH (ref 11.5–14.5)
WBC: 9.6 10*3/uL (ref 3.8–10.6)

## 2016-01-01 LAB — COMPREHENSIVE METABOLIC PANEL
ALK PHOS: 67 U/L (ref 38–126)
ALT: 8 U/L — AB (ref 17–63)
AST: 15 U/L (ref 15–41)
Albumin: 3 g/dL — ABNORMAL LOW (ref 3.5–5.0)
Anion gap: 4 — ABNORMAL LOW (ref 5–15)
BUN: 16 mg/dL (ref 6–20)
CALCIUM: 8.9 mg/dL (ref 8.9–10.3)
CHLORIDE: 97 mmol/L — AB (ref 101–111)
CO2: 30 mmol/L (ref 22–32)
CREATININE: 0.49 mg/dL — AB (ref 0.61–1.24)
GFR calc Af Amer: >60 mL/min (ref >60)
GFR calc non Af Amer: >60 mL/min (ref >60)
Glucose, Bld: 115 mg/dL — ABNORMAL HIGH (ref 65–99)
Potassium: 4.5 mmol/L (ref 3.5–5.1)
SODIUM: 131 mmol/L — AB (ref 135–145)
Total Bilirubin: 0.2 mg/dL — ABNORMAL LOW (ref 0.3–1.2)
Total Protein: 7.3 g/dL (ref 6.5–8.1)

## 2016-01-01 MED ORDER — OXYCODONE HCL 30 MG PO TABS: ORAL_TABLET | ORAL | Status: DC

## 2016-01-01 MED ORDER — NYSTATIN 100000 UNIT/ML MT SUSP: 5.0000 mL | Freq: Three times a day (TID) | OROMUCOSAL | Status: DC

## 2016-01-01 MED ORDER — AMOXICILLIN 250 MG/5ML PO SUSR: 500.0000 mg | Freq: Three times a day (TID) | ORAL | Status: DC

## 2016-01-01 NOTE — Progress Notes (Signed)
[place] @ [facility] [phone]  Fax:([phone]) [place] OB: September 15, 1956  MR#: [id number]  VWP#:[id number]  Patient Care Team: Cyndi Bender, PA-C as PCP - General (Physician Assistant) Beverly Gust, MD (Unknown Physician Specialty)  CHIEF COMPLAINT:  Chief Complaint  Patient presents with  . Cancer of tongue   1.  Has a history of cancer of the tongue in 2006.  Patient underwent resection followed by radiation therapy 2.  Increasing difficulty in swallowing for last 2 or 3 weeks.  Patient had upper endoscopy done in December of 2015. 3.  Significant weight loss 4.  Abnormal PET scan (August of 2016) 5.  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  (Cis-platinum and radiation therapy) (September 2 016) T3 N0 M0 tumor Status post PEG tube placement (September, 2016) 6. Poor tolerance dose cis-platinum therapy, so patient has been switched over to cetuximab along with radiation therapy (October, 2016) 7.  Patient has finished cetuximab as well as radiation therapy by 23rd of November 2 016 8.  Patient was recently hospitalized with bilateral pneumonia, aspiration pneumonia and was transferred to rehabilitation (December, 2016)  VISIT DIAGNOSIS:  Significant weight loss. Difficulty swallowing. History of carcinoma of tongue      INTERVAL HISTORY:  Is not came as an acute and on complaining of cough and greenish yellowish expectoration no chills or fever of 2 weeks duration. Sore throat.  Not able to swallow anything. Feeling weak and tired. He is here for ongoing evaluation and treatment consideration.   REVIEW OF SYSTEMS:   Gen. status: Patient is feeling weak and tired.  Has lost significant weight Recently hospitalized with bilateral pneumonia Poor appetite patient is very depressed.  Losing weight. Increasing difficulty swallowing as mentioned in  history of present illness HEENT: As mentioned in history of present illness patient had history of carcinoma of tongue status post radiation therapy recently having increasing soreness in the mouth. Lungs: Increasing cough shortness of breath yellowish expectoration no fever no hemoptysis GI: Persistent nausea as described above in history of present illness [place] close skeletal system no bony pain Lower extremity no swelling Skin: No rash Abdomen: Had a PEG tube placement  As per HPI. Otherwise, a complete review of systems is negatve.  PAST MEDICAL HISTORY: Carcinoma of tongue Hypertension Hypercholesterolemia Previous substance abuse Gastroesophageal reflux disease  PAST SURGICAL HISTORY: Treated for carcinoma of tongue FAMILY HISTORY There is no significant family history of breast cancer, ovarian cancer, colon cancer    ADVANCED DIRECTIVES:  Patient does not have any living will or healthcare power of attorney.  Information was given .  Available resources had been discussed.  We will follow-up on subsequent appointments regarding this issue  HEALTH MAINTENANCE: Social History  Substance Use Topics  . Smoking status: Former Smoker -- 1.00 packs/day for 0 years    Types: Cigarettes    Quit date: 09/21/2014  . Smokeless tobacco: None  . Alcohol Use: 7.2 oz/week    12 Cans of beer per week     Comment: beer/wine every day    Quit smoking in November of 2015.  History of smoking for several years in the past.  No Known Allergies  Current Outpatient Prescriptions  Medication Sig Dispense Refill  . acidophilus (RISAQUAD) CAPS capsule Take 1 capsule by mouth at bedtime.     . Benzocaine (ANBESOL MT) 1 application by Other route as needed (for gum  pain).    . chlorhexidine (PERIDEX) 0.12 % solution 15 mLs by Mouth Rinse route 2 (two) times daily. 120 mL 0  . citalopram (CELEXA) 20 MG tablet Take 1 tablet (20 mg total) by mouth daily. 30 tablet 3  . lidocaine-prilocaine  (EMLA) cream Apply 1 application topically as needed. 30 g 3  . lisinopril (PRINIVIL,ZESTRIL) 10 MG tablet Take 10 mg by mouth at bedtime.     . Nutritional Supplements (FEEDING SUPPLEMENT, OSMOLITE 1.5 CAL,) LIQD Place 240 mLs into feeding tube 5 (five) times daily. 150 Bottle 3  . nystatin (MYCOSTATIN) 100000 UNIT/ML suspension Take 5 mLs by mouth as needed (for thrush).    Marland Kitchen omeprazole (PRILOSEC) 20 MG capsule Take 1 capsule (20 mg total) by mouth 2 (two) times daily before a meal. 60 capsule 3  . ondansetron (ZOFRAN) 4 MG tablet Take 1 tablet (4 mg total) by mouth every 4 (four) hours as needed for nausea or vomiting. 45 tablet 0  . oxycodone (ROXICODONE) 30 MG immediate release tablet Take one tablet by mouth up to 7 (seven) times daily as needed for pain 210 tablet 0  . pantoprazole (PROTONIX) 40 MG tablet Take 1 tablet (40 mg total) by mouth 2 (two) times daily. 60 tablet 3  . phenol (CHLORASEPTIC) 1.4 % LIQD Use as directed 1 spray in the mouth or throat as needed for throat irritation / pain. 177 mL 0  . polyvinyl alcohol (LIQUIFILM TEARS) 1.4 % ophthalmic solution Place 1 drop into both eyes as needed for dry eyes. 15 mL 0  . pravastatin (PRAVACHOL) 40 MG tablet Take 40 mg by mouth at bedtime.     . promethazine (PHENERGAN) 25 MG suppository Place 1 suppository (25 mg total) rectally every 8 (eight) hours as needed for nausea or vomiting. 12 each 0  . sucralfate (CARAFATE) 1 GM/10ML suspension Take 10 mLs (1 g total) by mouth 4 (four) times daily -  with meals and at bedtime. 420 mL 0  . traZODone (DESYREL) 100 MG tablet Take 100 mg by mouth at bedtime.     No current facility-administered medications for this visit.   Facility-Administered Medications Ordered in Other Visits  Medication Dose Route Frequency Provider Last Rate Last Dose  . sodium chloride 0.9 % injection 10 mL  10 mL Intracatheter PRN Evlyn Kanner, NP   10 mL at 09/10/15 0948  . sodium chloride 0.9 % injection 10 mL   10 mL Intravenous PRN Forest Gleason, MD   10 mL at 10/24/15 1000    OBJECTIVE: PHYSICAL EXAM: Gen. status: Patient is seen lean and cachectic. Feeling extremely weak and tired.  This membranes are dry skin turgor poor In wheelchair.  Performance status is 3 HEENT: No soreness in the mouth.  But swelling which is diffuse Lymphatic system: Supraclavicular, cervical, axillary, inguinal lymph nodes are not palpable Lungs: Bilateral rhonchi and occasional crepitation. Cardiac: Tachycardia Examination of the skin revealed no evidence of significant rashes, suspicious appearing nevi or other concerning lesions.. Abdominal exam revealed normal bowel sounds. The abdomen was soft, non-tender, and without masses, organomegaly, or appreciable enlargement of the abdominal aorta..   Peg  placement Psychiatric system: Depression and anxiety  Filed Vitals:   01/01/16 1058  BP: 107/74  Pulse: 93  Temp: 97.7 F (36.5 C)     Body mass index is 15.91 kg/(m^2).    ECOG FS:3 - Symptomatic, >50% confined to bed  LAB RESULTS:  Appointment on 01/01/2016  Component Date Value Ref  Range Status  . WBC 01/01/2016 9.6  3.8 - 10.6 K/uL Final  . RBC 01/01/2016 3.64* 4.40 - 5.90 MIL/uL Final  . Hemoglobin 01/01/2016 10.0* 13.0 - 18.0 g/dL Final  . HCT 01/01/2016 30.1* 40.0 - 52.0 % Final  . MCV 01/01/2016 82.8  80.0 - 100.0 fL Final  . MCH 01/01/2016 27.4  26.0 - 34.0 pg Final  . MCHC 01/01/2016 33.1  32.0 - 36.0 g/dL Final  . RDW 01/01/2016 20.9* 11.5 - 14.5 % Final  . Platelets 01/01/2016 484* 150 - 440 K/uL Final  . Neutrophils Relative % 01/01/2016 57   Final  . Neutro Abs 01/01/2016 5.4  1.4 - 6.5 K/uL Final  . Lymphocytes Relative 01/01/2016 32   Final  . Lymphs Abs 01/01/2016 3.1  1.0 - 3.6 K/uL Final  . Monocytes Relative 01/01/2016 10   Final  . Monocytes Absolute 01/01/2016 0.9  0.2 - 1.0 K/uL Final  . Eosinophils Relative 01/01/2016 1   Final  . Eosinophils Absolute 01/01/2016 0.1  0 - 0.7  K/uL Final  . Basophils Relative 01/01/2016 0   Final  . Basophils Absolute 01/01/2016 0.0  0 - 0.1 K/uL Final  . Sodium 01/01/2016 131* 135 - 145 mmol/L Final  . Potassium 01/01/2016 4.5  3.5 - 5.1 mmol/L Final  . Chloride 01/01/2016 97* 101 - 111 mmol/L Final  . CO2 01/01/2016 30  22 - 32 mmol/L Final  . Glucose, Bld 01/01/2016 115* 65 - 99 mg/dL Final  . BUN 01/01/2016 16  6 - 20 mg/dL Final  . Creatinine, Ser 01/01/2016 0.49* 0.61 - 1.24 mg/dL Final  . Calcium 01/01/2016 8.9  8.9 - 10.3 mg/dL Final  . Total Protein 01/01/2016 7.3  6.5 - 8.1 g/dL Final  . Albumin 01/01/2016 3.0* 3.5 - 5.0 g/dL Final  . AST 01/01/2016 15  15 - 41 U/L Final  . ALT 01/01/2016 8* 17 - 63 U/L Final  . Alkaline Phosphatase 01/01/2016 67  38 - 126 U/L Final  . Total Bilirubin 01/01/2016 PENDING  0.3 - 1.2 mg/dL Incomplete  . GFR calc non Af Amer 01/01/2016 >60  >60 mL/min Final  . GFR calc Af Amer 01/01/2016 >60  >60 mL/min Final   Comment: (NOTE) The eGFR has been calculated using the CKD EPI equation. This calculation has not been validated in all clinical situations. eGFR's persistently <60 mL/min signify possible Chronic Kidney Disease.   . Anion gap 01/01/2016 4* 5 - 15 Final       ASSESSMENT:  A. 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  Carcinoma of tongue in 2006 status post resection and radiation therapy exit staging not known and old records being off pain for review 2.  Hypotension patient has been asked to be off antihypertensive medication 3.  Weight loss has been stabilized as patient is slowly increasing back to feeding\ 4.  Anemia which is multifactorial does not need any transfusion at present time 5.  Aspiration pneumonia chest x-ray has been reviewed  There is more infiltrate in the right lower lobe than the  left t    PLAN:    Acute onset cough respiratory symptoms secondary to progressive infection  Chest x-ray has been reviewed independently..   Start patient on amoxicillin   500 mg 3 times a day for 7 days Admission   mucinexNystatin  mouthwash  Overall very poor prognosis due to multiple factors Patient may benefit from ongoing physiotherapy.  Referred to home health  Services     Lab data has been reviewed        No matching staging information was found for the patient.  Forest Gleason, MD   01/01/2016 11:19 AM

## 2016-01-01 NOTE — Progress Notes (Signed)
Patient here today for congestion and cough.  States he has been sick for 2 weeks.  Coughing up "green chunks".  States his throat is sore and "feels like it is on fire".

## 2016-01-16 ENCOUNTER — Other Ambulatory Visit: Payer: Self-pay | Admitting: Oncology

## 2016-01-24 ENCOUNTER — Inpatient Hospital Stay: Payer: Medicare Other | Attending: Oncology

## 2016-01-24 DIAGNOSIS — C801 Malignant (primary) neoplasm, unspecified: Secondary | ICD-10-CM

## 2016-01-24 DIAGNOSIS — Z87891 Personal history of nicotine dependence: Secondary | ICD-10-CM | POA: Insufficient documentation

## 2016-01-24 DIAGNOSIS — Z452 Encounter for adjustment and management of vascular access device: Secondary | ICD-10-CM | POA: Insufficient documentation

## 2016-01-24 DIAGNOSIS — M1990 Unspecified osteoarthritis, unspecified site: Secondary | ICD-10-CM | POA: Diagnosis not present

## 2016-01-24 DIAGNOSIS — K219 Gastro-esophageal reflux disease without esophagitis: Secondary | ICD-10-CM | POA: Diagnosis not present

## 2016-01-24 DIAGNOSIS — Z923 Personal history of irradiation: Secondary | ICD-10-CM | POA: Insufficient documentation

## 2016-01-24 DIAGNOSIS — R6884 Jaw pain: Secondary | ICD-10-CM | POA: Diagnosis not present

## 2016-01-24 DIAGNOSIS — Z9221 Personal history of antineoplastic chemotherapy: Secondary | ICD-10-CM | POA: Diagnosis not present

## 2016-01-24 DIAGNOSIS — R634 Abnormal weight loss: Secondary | ICD-10-CM | POA: Insufficient documentation

## 2016-01-24 DIAGNOSIS — C01 Malignant neoplasm of base of tongue: Secondary | ICD-10-CM | POA: Diagnosis not present

## 2016-01-24 DIAGNOSIS — I10 Essential (primary) hypertension: Secondary | ICD-10-CM | POA: Insufficient documentation

## 2016-01-24 DIAGNOSIS — Z79899 Other long term (current) drug therapy: Secondary | ICD-10-CM | POA: Diagnosis not present

## 2016-01-24 DIAGNOSIS — Z931 Gastrostomy status: Secondary | ICD-10-CM | POA: Insufficient documentation

## 2016-01-24 MED ORDER — SODIUM CHLORIDE 0.9% FLUSH
10.0000 mL | INTRAVENOUS | Status: DC | PRN
  Administered 2016-01-24: 10 mL via INTRAVENOUS
  Filled 2016-01-24: qty 10

## 2016-01-24 MED ORDER — HEPARIN SOD (PORK) LOCK FLUSH 100 UNIT/ML IV SOLN
500.0000 [IU] | Freq: Once | INTRAVENOUS | Status: AC
  Administered 2016-01-24: 500 [IU] via INTRAVENOUS

## 2016-01-27 DIAGNOSIS — M6281 Muscle weakness (generalized): Secondary | ICD-10-CM | POA: Diagnosis not present

## 2016-01-27 DIAGNOSIS — R1310 Dysphagia, unspecified: Secondary | ICD-10-CM | POA: Diagnosis not present

## 2016-01-28 ENCOUNTER — Other Ambulatory Visit: Payer: Self-pay | Admitting: *Deleted

## 2016-01-28 ENCOUNTER — Telehealth: Payer: Self-pay | Admitting: *Deleted

## 2016-01-28 DIAGNOSIS — C029 Malignant neoplasm of tongue, unspecified: Secondary | ICD-10-CM

## 2016-01-28 MED ORDER — OXYCODONE HCL 30 MG PO TABS: ORAL_TABLET | ORAL | Status: DC

## 2016-01-28 NOTE — Telephone Encounter (Signed)
Per pt's wife, needs letter for jury duty and requests refill of oxycodone to pick for post-date of 02/08/16. Pt's wife did not want to make separate trip to cancer center if possible. Informed pt's wife that letter and oxycodone refill for 02/08/16 will be ready for pick up on 3/21. Pt's wife verbalized understanding.

## 2016-01-29 DIAGNOSIS — C029 Malignant neoplasm of tongue, unspecified: Secondary | ICD-10-CM | POA: Diagnosis not present

## 2016-01-29 DIAGNOSIS — C028 Malignant neoplasm of overlapping sites of tongue: Secondary | ICD-10-CM | POA: Diagnosis not present

## 2016-01-29 DIAGNOSIS — Z85810 Personal history of malignant neoplasm of tongue: Secondary | ICD-10-CM | POA: Diagnosis not present

## 2016-01-29 DIAGNOSIS — R1310 Dysphagia, unspecified: Secondary | ICD-10-CM | POA: Diagnosis not present

## 2016-01-29 DIAGNOSIS — E46 Unspecified protein-calorie malnutrition: Secondary | ICD-10-CM | POA: Diagnosis not present

## 2016-02-04 ENCOUNTER — Telehealth: Payer: Self-pay | Admitting: Oncology

## 2016-02-04 NOTE — Telephone Encounter (Signed)
Per VO Dr Oliva Bustard, see Magda Paganini tomorrow, wife agrees to appt at [phone]

## 2016-02-04 NOTE — Telephone Encounter (Signed)
His jaws ache constantly, not feeling good, and losing weight. He has lost about four pounds in about two weeks. Sick to his stomach all the time. [phone]. Can he come in and see Dr. Oliva Bustard sometime soon? Please advise.

## 2016-02-05 ENCOUNTER — Inpatient Hospital Stay: Payer: Medicare Other

## 2016-02-05 ENCOUNTER — Inpatient Hospital Stay (HOSPITAL_BASED_OUTPATIENT_CLINIC_OR_DEPARTMENT_OTHER): Payer: Medicare Other | Admitting: Family Medicine

## 2016-02-05 VITALS — BP 106/66 | HR 106 | Temp 97.6°F | Resp 18 | Wt 118.4 lb

## 2016-02-05 DIAGNOSIS — R634 Abnormal weight loss: Secondary | ICD-10-CM | POA: Diagnosis not present

## 2016-02-05 DIAGNOSIS — C01 Malignant neoplasm of base of tongue: Secondary | ICD-10-CM | POA: Diagnosis not present

## 2016-02-05 DIAGNOSIS — M1990 Unspecified osteoarthritis, unspecified site: Secondary | ICD-10-CM | POA: Diagnosis not present

## 2016-02-05 DIAGNOSIS — Z923 Personal history of irradiation: Secondary | ICD-10-CM | POA: Diagnosis not present

## 2016-02-05 DIAGNOSIS — Z452 Encounter for adjustment and management of vascular access device: Secondary | ICD-10-CM | POA: Diagnosis not present

## 2016-02-05 DIAGNOSIS — C029 Malignant neoplasm of tongue, unspecified: Secondary | ICD-10-CM

## 2016-02-05 DIAGNOSIS — Z9221 Personal history of antineoplastic chemotherapy: Secondary | ICD-10-CM | POA: Diagnosis not present

## 2016-02-05 DIAGNOSIS — C801 Malignant (primary) neoplasm, unspecified: Secondary | ICD-10-CM

## 2016-02-05 DIAGNOSIS — Z79899 Other long term (current) drug therapy: Secondary | ICD-10-CM | POA: Diagnosis not present

## 2016-02-05 DIAGNOSIS — R6884 Jaw pain: Secondary | ICD-10-CM

## 2016-02-05 DIAGNOSIS — Z87891 Personal history of nicotine dependence: Secondary | ICD-10-CM | POA: Diagnosis not present

## 2016-02-05 DIAGNOSIS — K219 Gastro-esophageal reflux disease without esophagitis: Secondary | ICD-10-CM | POA: Diagnosis not present

## 2016-02-05 DIAGNOSIS — Z931 Gastrostomy status: Secondary | ICD-10-CM | POA: Diagnosis not present

## 2016-02-05 DIAGNOSIS — I10 Essential (primary) hypertension: Secondary | ICD-10-CM | POA: Diagnosis not present

## 2016-02-05 LAB — CBC WITH DIFFERENTIAL/PLATELET
BASOS PCT: 0 %
Basophils Absolute: 0 10*3/uL (ref 0–0.1)
Eosinophils Absolute: 0.1 10*3/uL (ref 0–0.7)
Eosinophils Relative: 1 %
HEMATOCRIT: 30.3 % — AB (ref 40.0–52.0)
Hemoglobin: 9.9 g/dL — ABNORMAL LOW (ref 13.0–18.0)
LYMPHS ABS: 3.7 10*3/uL — AB (ref 1.0–3.6)
Lymphocytes Relative: 42 %
MCH: 25.8 pg — AB (ref 26.0–34.0)
MCHC: 32.8 g/dL (ref 32.0–36.0)
MCV: 78.8 fL — AB (ref 80.0–100.0)
MONO ABS: 1.1 10*3/uL — AB (ref 0.2–1.0)
MONOS PCT: 12 %
NEUTROS ABS: 4 10*3/uL (ref 1.4–6.5)
Neutrophils Relative %: 45 %
Platelets: 387 10*3/uL (ref 150–440)
RBC: 3.85 MIL/uL — ABNORMAL LOW (ref 4.40–5.90)
RDW: 17.5 % — AB (ref 11.5–14.5)
WBC: 8.9 10*3/uL (ref 3.8–10.6)

## 2016-02-05 LAB — COMPREHENSIVE METABOLIC PANEL
ALT: 10 U/L — ABNORMAL LOW (ref 17–63)
ANION GAP: 5 (ref 5–15)
AST: 18 U/L (ref 15–41)
Albumin: 2.9 g/dL — ABNORMAL LOW (ref 3.5–5.0)
Alkaline Phosphatase: 69 U/L (ref 38–126)
BILIRUBIN TOTAL: 0.2 mg/dL — AB (ref 0.3–1.2)
BUN: 19 mg/dL (ref 6–20)
CALCIUM: 8.6 mg/dL — AB (ref 8.9–10.3)
CO2: 31 mmol/L (ref 22–32)
Chloride: 97 mmol/L — ABNORMAL LOW (ref 101–111)
Creatinine, Ser: 0.65 mg/dL (ref 0.61–1.24)
Glucose, Bld: 87 mg/dL (ref 65–99)
POTASSIUM: 4.3 mmol/L (ref 3.5–5.1)
Sodium: 133 mmol/L — ABNORMAL LOW (ref 135–145)
TOTAL PROTEIN: 7.6 g/dL (ref 6.5–8.1)

## 2016-02-05 MED ORDER — SODIUM CHLORIDE 0.9% FLUSH
10.0000 mL | Freq: Once | INTRAVENOUS | Status: AC
  Administered 2016-02-05: 10 mL via INTRAVENOUS
  Filled 2016-02-05: qty 10

## 2016-02-05 MED ORDER — DEXAMETHASONE SODIUM PHOSPHATE 10 MG/ML IJ SOLN
10.0000 mg | Freq: Once | INTRAMUSCULAR | Status: DC
  Filled 2016-02-05: qty 1

## 2016-02-05 MED ORDER — HEPARIN SOD (PORK) LOCK FLUSH 100 UNIT/ML IV SOLN
500.0000 [IU] | Freq: Once | INTRAVENOUS | Status: AC
  Administered 2016-02-05: 500 [IU] via INTRAVENOUS
  Filled 2016-02-05: qty 5

## 2016-02-05 MED ORDER — SODIUM CHLORIDE 0.9 % IV SOLN
Freq: Once | INTRAVENOUS | Status: AC
  Administered 2016-02-05: 15:00:00 via INTRAVENOUS
  Filled 2016-02-05: qty 1000

## 2016-02-05 MED ORDER — SODIUM CHLORIDE 0.9 % IV SOLN
10.0000 mg | Freq: Once | INTRAVENOUS | Status: AC
  Administered 2016-02-05: 10 mg via INTRAVENOUS
  Filled 2016-02-05: qty 1

## 2016-02-05 NOTE — Progress Notes (Signed)
[name]  [phone]  [phone]     [place] DOB: 08-Jan-1956  MR#: [id number]  WCB#:[id number]  Patient Care Team: Cyndi Bender, PA-C as PCP - General (Physician Assistant) Beverly Gust, MD (Unknown Physician Specialty)  CHIEF COMPLAINT:  Chief Complaint  Patient presents with  . Nausea  . Emesis  . Pain  . Fatigue    INTERVAL HISTORY:  Patient is here as an acute add on regarding increasing pain in jaw with congestion, nausea, weight loss, and leakage around PEG tube. He has history of left sided base of tongue cancer, diagnosed in September 2016. Patient has previous history of tongue cancer in 2006. Unsure if most recent is recurrent versus second primary. He started on chemotherapy, with cisplatin, and radiation therapy in September and completed in November. He also completed cetuximab in November 2016. He does have a PEG tube in place and was recently discharged from the hospital and rehabilitation facility with aspiration pneumonia.   REVIEW OF SYSTEMS:   Review of Systems  Constitutional: Positive for weight loss and malaise/fatigue. Negative for fever, chills and diaphoresis.  HENT:       Jaw pain  Eyes: Negative.   Respiratory: Positive for cough and sputum production. Negative for hemoptysis, shortness of breath and wheezing.   Cardiovascular: Negative for chest pain, palpitations, orthopnea, claudication, leg swelling and PND.  Gastrointestinal: Positive for nausea. Negative for heartburn, vomiting, abdominal pain, diarrhea, constipation, blood in stool and melena.       Leakage from around PEG tube.  Genitourinary: Negative.   Musculoskeletal: Positive for back pain and neck pain.  Skin: Negative.   Neurological: Positive for weakness. Negative for dizziness, tingling, focal weakness and seizures.  Endo/Heme/Allergies: Does not bruise/bleed easily.  Psychiatric/Behavioral: Negative for depression. The patient  is not nervous/anxious and does not have insomnia.     As per HPI. Otherwise, a complete review of systems is negatve.  ONCOLOGY HISTORY: Oncology History   1. Has a history of cancer of the tongue in 2006. Patient underwent resection followed by radiation therapy 2. Increasing difficulty in swallowing for last 2 or 3 weeks. Patient had upper endoscopy done in December of 2015. 3. Significant weight loss 4. Abnormal PET scan (August of 2016) 5. Recurrent versus second primary base of the tongue on the left side (unresectable) 6. Started on chemoradiation therapy. (Cis-platinum and radiation therapy) (September 2 016) T3 N0 M0 tumor 7. Status post PEG tube placement (September, 2016)     Cancer of tongue [facility])    Initial Diagnosis Cancer of tongue [facility])    PAST MEDICAL HISTORY: Past Medical History  Diagnosis Date  . Hypertension   . Pneumonia     2004  . GERD (gastroesophageal reflux disease)   . Arthritis   . Anemia     blood transfusion 2012  . Bleeding ulcer   . Machinery accident     LEG INJURY  . Cancer [facility])     mouth 2006 then again 2016    PAST SURGICAL HISTORY: Past Surgical History  Procedure Laterality Date  . Leg surgery    . Joint replacement      right hip  . Tongue biopsy    . Tongue surgery      multiple surgeries  . Direct laryngoscopy N/A 06/26/2015    Procedure: Laryngoscopy with tongue base biopsy ;  Surgeon: Beverly Gust, MD;  Location: [facility] ORS;  Service: ENT;  Laterality: N/A;  . Esophagogastroduodenoscopy N/A 07/06/2015  Procedure: ESOPHAGOGASTRODUODENOSCOPY (EGD) and percutaneous gastrostomy tyube placement;  Surgeon: Lollie Sails, MD;  Location: [facility] ENDOSCOPY;  Service: Endoscopy;  Laterality: N/A;  Residual need to be done in the afternoon on 07/06/2015. Combined procedure with Dr. Gustavo Lah and Dr Rayann Heman  . Peripheral vascular catheterization N/A 07/27/2015    Procedure: Glori Luis Cath Insertion;  Surgeon: Katha Cabal, MD;   Location: [place] CV LAB;  Service: Cardiovascular;  Laterality: N/A;  . Esophagogastroduodenoscopy (egd) with propofol N/A 08/07/2015    Procedure: ESOPHAGOGASTRODUODENOSCOPY (EGD) WITH PROPOFOL;  Surgeon: Lollie Sails, MD;  Location: [facility] ENDOSCOPY;  Service: Endoscopy;  Laterality: N/A;  . Peg placement N/A 10/08/2015    Procedure: PERCUTANEOUS ENDOSCOPIC GASTROSTOMY (PEG) PLACEMENT;  Surgeon: Manya Silvas, MD;  Location: [facility] ENDOSCOPY;  Service: Endoscopy;  Laterality: N/A;    FAMILY HISTORY Family History  Problem Relation Age of Onset  . CAD Mother   . CAD Father     GYNECOLOGIC HISTORY:  No LMP for male patient.     ADVANCED DIRECTIVES:    HEALTH MAINTENANCE: Social History  Substance Use Topics  . Smoking status: Former Smoker -- 1.00 packs/day for 0 years    Types: Cigarettes    Quit date: 09/21/2014  . Smokeless tobacco: Not on file  . Alcohol Use: 7.2 oz/week    12 Cans of beer per week     Comment: beer/wine every day    No Known Allergies  Current Outpatient Prescriptions  Medication Sig Dispense Refill  . acidophilus (RISAQUAD) CAPS capsule Take 1 capsule by mouth at bedtime.     . Benzocaine (ANBESOL MT) 1 application by Other route as needed (for gum pain).    . chlorhexidine (PERIDEX) 0.12 % solution 15 mLs by Mouth Rinse route 2 (two) times daily. 120 mL 0  . citalopram (CELEXA) 20 MG tablet Take 1 tablet (20 mg total) by mouth daily. 30 tablet 3  . lidocaine-prilocaine (EMLA) cream Apply 1 application topically as needed. 30 g 3  . lisinopril (PRINIVIL,ZESTRIL) 10 MG tablet Take 10 mg by mouth at bedtime.     . Nutritional Supplements (FEEDING SUPPLEMENT, OSMOLITE 1.5 CAL,) LIQD Place 240 mLs into feeding tube 5 (five) times daily. 150 Bottle 3  . omeprazole (PRILOSEC) 20 MG capsule Take 1 capsule (20 mg total) by mouth 2 (two) times daily before a meal. 60 capsule 3  . ondansetron (ZOFRAN) 4 MG tablet Take 1 tablet (4 mg total) by mouth  every 4 (four) hours as needed for nausea or vomiting. 45 tablet 0  . [START ON 02/08/2016] oxycodone (ROXICODONE) 30 MG immediate release tablet Take one tablet by mouth up to 7 (seven) times daily as needed for pain 210 tablet 0  . pantoprazole (PROTONIX) 40 MG tablet TAKE 1 TABLET BY MOUTH TWICE DAILY 60 tablet 0  . phenol (CHLORASEPTIC) 1.4 % LIQD Use as directed 1 spray in the mouth or throat as needed for throat irritation / pain. 177 mL 0  . polyvinyl alcohol (LIQUIFILM TEARS) 1.4 % ophthalmic solution Place 1 drop into both eyes as needed for dry eyes. 15 mL 0  . pravastatin (PRAVACHOL) 40 MG tablet Take 40 mg by mouth at bedtime.     . promethazine (PHENERGAN) 25 MG suppository Place 1 suppository (25 mg total) rectally every 8 (eight) hours as needed for nausea or vomiting. 12 each 0  . sucralfate (CARAFATE) 1 GM/10ML suspension Take 10 mLs (1 g total) by mouth 4 (four) times daily -  with meals and at bedtime. 420 mL 0  . traZODone (DESYREL) 100 MG tablet Take 100 mg by mouth at bedtime.     No current facility-administered medications for this visit.   Facility-Administered Medications Ordered in Other Visits  Medication Dose Route Frequency Provider Last Rate Last Dose  . sodium chloride 0.9 % injection 10 mL  10 mL Intracatheter PRN Evlyn Kanner, NP   10 mL at 09/10/15 0948  . sodium chloride 0.9 % injection 10 mL  10 mL Intravenous PRN Forest Gleason, MD   10 mL at 10/24/15 1000    OBJECTIVE: BP 106/66 mmHg  Pulse 106  Temp(Src) 97.6 F (36.4 C) (Tympanic)  Resp 18  Wt 118 lb 6.2 oz (53.7 kg)   Body mass index is 15.19 kg/(m^2).    ECOG FS:3 - Symptomatic, >50% confined to bed  General: Cachectic, sitting in wheelchair, in no acute distress. Eyes: Pink conjunctiva, anicteric sclera. HEENT: Normocephalic, dry mucous membranes, clear oropharnyx. Lungs: Crackles in bases bilaterally. Heart: Regular rate and rhythm. No rubs, murmurs, or gallops. Abdomen: PEG tube placement,  green drainage from around insertion site. Soft, nontender, nondistended. No organomegaly noted, normoactive bowel sounds. Musculoskeletal: No edema, cyanosis, or clubbing. Neuro: Alert, answering all questions appropriately. Cranial nerves grossly intact. Skin: No rashes or petechiae noted. Psych: Normal affect. Lymphatics: No cervical, clavicular, or axillary LAD.   LAB RESULTS:  Infusion on 02/05/2016  Component Date Value Ref Range Status  . WBC 02/05/2016 8.9  3.8 - 10.6 K/uL Final  . RBC 02/05/2016 3.85* 4.40 - 5.90 MIL/uL Final  . Hemoglobin 02/05/2016 9.9* 13.0 - 18.0 g/dL Final  . HCT 02/05/2016 30.3* 40.0 - 52.0 % Final  . MCV 02/05/2016 78.8* 80.0 - 100.0 fL Final  . MCH 02/05/2016 25.8* 26.0 - 34.0 pg Final  . MCHC 02/05/2016 32.8  32.0 - 36.0 g/dL Final  . RDW 02/05/2016 17.5* 11.5 - 14.5 % Final  . Platelets 02/05/2016 387  150 - 440 K/uL Final  . Neutrophils Relative % 02/05/2016 45   Final  . Neutro Abs 02/05/2016 4.0  1.4 - 6.5 K/uL Final  . Lymphocytes Relative 02/05/2016 42   Final  . Lymphs Abs 02/05/2016 3.7* 1.0 - 3.6 K/uL Final  . Monocytes Relative 02/05/2016 12   Final  . Monocytes Absolute 02/05/2016 1.1* 0.2 - 1.0 K/uL Final  . Eosinophils Relative 02/05/2016 1   Final  . Eosinophils Absolute 02/05/2016 0.1  0 - 0.7 K/uL Final  . Basophils Relative 02/05/2016 0   Final  . Basophils Absolute 02/05/2016 0.0  0 - 0.1 K/uL Final  . Sodium 02/05/2016 133* 135 - 145 mmol/L Final  . Potassium 02/05/2016 4.3  3.5 - 5.1 mmol/L Final  . Chloride 02/05/2016 97* 101 - 111 mmol/L Final  . CO2 02/05/2016 31  22 - 32 mmol/L Final  . Glucose, Bld 02/05/2016 87  65 - 99 mg/dL Final  . BUN 02/05/2016 19  6 - 20 mg/dL Final  . Creatinine, Ser 02/05/2016 0.65  0.61 - 1.24 mg/dL Final  . Calcium 02/05/2016 8.6* 8.9 - 10.3 mg/dL Final  . Total Protein 02/05/2016 7.6  6.5 - 8.1 g/dL Final  . Albumin 02/05/2016 2.9* 3.5 - 5.0 g/dL Final  . AST 02/05/2016 18  15 - 41 U/L  Final  . ALT 02/05/2016 10* 17 - 63 U/L Final  . Alkaline Phosphatase 02/05/2016 69  38 - 126 U/L Final  . Total Bilirubin 02/05/2016 0.2* 0.3 - 1.2  mg/dL Final  . GFR calc non Af Amer 02/05/2016 >60  >60 mL/min Final  . GFR calc Af Amer 02/05/2016 >60  >60 mL/min Final   Comment: (NOTE) The eGFR has been calculated using the CKD EPI equation. This calculation has not been validated in all clinical situations. eGFR's persistently <60 mL/min signify possible Chronic Kidney Disease.   . Anion gap 02/05/2016 5  5 - 15 Final    STUDIES: No results found.  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  PLAN:   1. Base of tongue cancer on left. Patient is status post chemoradiation with cisplatin. Patient was switched over to cetuximab with radiation therapy due to poor tolerance. Radiation and cetuximab were completed in November 2016. Patient did recently discharge from the hospital with bilateral aspiration pneumonia.  With numerous complaints of increasing pain and fullness in jaw as well as continued weight loss, will move patients PET scan up as soon as is possible.  Discussed with patient and wife that he has an overall poor prognosis due to other comorbidities as well. Will await results and contact them regarding results.   Patient expressed understanding and was in agreement with this plan. He also understands that He can call clinic at any time with any questions, concerns, or complaints.   Dr. Rogue Bussing was available for consultation and review of plan of care for this patient.   Evlyn Kanner, NP   02/05/2016 4:49 PM

## 2016-02-05 NOTE — Progress Notes (Signed)
Pt came in today with complaints of sore throat, jaw pain x 3 weeks, nausea, vomiting, constipation, weakness, fatigue, weight loss, and drainage from around PEG tube.

## 2016-02-07 ENCOUNTER — Emergency Department: Payer: Medicare Other

## 2016-02-07 ENCOUNTER — Encounter: Payer: Self-pay | Admitting: Emergency Medicine

## 2016-02-07 ENCOUNTER — Inpatient Hospital Stay
Admission: EM | Admit: 2016-02-07 | Discharge: 2016-02-09 | DRG: 177 | Disposition: A | Discharge status: Home Health | Payer: Medicare Other | Attending: Internal Medicine | Admitting: Internal Medicine

## 2016-02-07 DIAGNOSIS — R64 Cachexia: Secondary | ICD-10-CM | POA: Diagnosis present

## 2016-02-07 DIAGNOSIS — K219 Gastro-esophageal reflux disease without esophagitis: Secondary | ICD-10-CM | POA: Diagnosis present

## 2016-02-07 DIAGNOSIS — Z79899 Other long term (current) drug therapy: Secondary | ICD-10-CM | POA: Diagnosis not present

## 2016-02-07 DIAGNOSIS — E871 Hypo-osmolality and hyponatremia: Secondary | ICD-10-CM | POA: Diagnosis present

## 2016-02-07 DIAGNOSIS — Z8711 Personal history of peptic ulcer disease: Secondary | ICD-10-CM

## 2016-02-07 DIAGNOSIS — R05 Cough: Secondary | ICD-10-CM | POA: Diagnosis not present

## 2016-02-07 DIAGNOSIS — M1990 Unspecified osteoarthritis, unspecified site: Secondary | ICD-10-CM | POA: Diagnosis present

## 2016-02-07 DIAGNOSIS — Z96641 Presence of right artificial hip joint: Secondary | ICD-10-CM | POA: Diagnosis present

## 2016-02-07 DIAGNOSIS — R1310 Dysphagia, unspecified: Secondary | ICD-10-CM | POA: Diagnosis not present

## 2016-02-07 DIAGNOSIS — J690 Pneumonitis due to inhalation of food and vomit: Secondary | ICD-10-CM | POA: Diagnosis not present

## 2016-02-07 DIAGNOSIS — Z9889 Other specified postprocedural states: Secondary | ICD-10-CM | POA: Diagnosis not present

## 2016-02-07 DIAGNOSIS — C029 Malignant neoplasm of tongue, unspecified: Secondary | ICD-10-CM | POA: Diagnosis present

## 2016-02-07 DIAGNOSIS — Z87891 Personal history of nicotine dependence: Secondary | ICD-10-CM

## 2016-02-07 DIAGNOSIS — R627 Adult failure to thrive: Secondary | ICD-10-CM | POA: Diagnosis not present

## 2016-02-07 DIAGNOSIS — G8929 Other chronic pain: Secondary | ICD-10-CM | POA: Diagnosis present

## 2016-02-07 DIAGNOSIS — R0602 Shortness of breath: Secondary | ICD-10-CM | POA: Diagnosis not present

## 2016-02-07 DIAGNOSIS — G894 Chronic pain syndrome: Secondary | ICD-10-CM | POA: Diagnosis present

## 2016-02-07 DIAGNOSIS — I10 Essential (primary) hypertension: Secondary | ICD-10-CM | POA: Diagnosis not present

## 2016-02-07 DIAGNOSIS — Z8249 Family history of ischemic heart disease and other diseases of the circulatory system: Secondary | ICD-10-CM

## 2016-02-07 DIAGNOSIS — E43 Unspecified severe protein-calorie malnutrition: Secondary | ICD-10-CM | POA: Diagnosis present

## 2016-02-07 DIAGNOSIS — C069 Malignant neoplasm of mouth, unspecified: Secondary | ICD-10-CM | POA: Diagnosis present

## 2016-02-07 HISTORY — DX: Peptic ulcer, site unspecified, unspecified as acute or chronic, without hemorrhage or perforation: K27.9

## 2016-02-07 HISTORY — DX: Unspecified severe protein-calorie malnutrition: E43

## 2016-02-07 HISTORY — DX: Chronic pain syndrome: G89.4

## 2016-02-07 LAB — BASIC METABOLIC PANEL
Anion gap: 6 (ref 5–15)
BUN: 18 mg/dL (ref 6–20)
CALCIUM: 8.5 mg/dL — AB (ref 8.9–10.3)
CHLORIDE: 100 mmol/L — AB (ref 101–111)
CO2: 26 mmol/L (ref 22–32)
CREATININE: 0.43 mg/dL — AB (ref 0.61–1.24)
GFR calc Af Amer: >60 mL/min (ref >60)
GFR calc non Af Amer: >60 mL/min (ref >60)
Glucose, Bld: 107 mg/dL — ABNORMAL HIGH (ref 65–99)
Potassium: 4.1 mmol/L (ref 3.5–5.1)
Sodium: 132 mmol/L — ABNORMAL LOW (ref 135–145)

## 2016-02-07 LAB — CBC WITH DIFFERENTIAL/PLATELET
BASOS ABS: 0 10*3/uL (ref 0–0.1)
BASOS PCT: 0 %
Eosinophils Absolute: 0.1 10*3/uL (ref 0–0.7)
Eosinophils Relative: 1 %
HEMATOCRIT: 28.4 % — AB (ref 40.0–52.0)
HEMOGLOBIN: 9.2 g/dL — AB (ref 13.0–18.0)
LYMPHS PCT: 29 %
Lymphs Abs: 3.3 10*3/uL (ref 1.0–3.6)
MCH: 25.6 pg — ABNORMAL LOW (ref 26.0–34.0)
MCHC: 32.3 g/dL (ref 32.0–36.0)
MCV: 79.1 fL — AB (ref 80.0–100.0)
Monocytes Absolute: 1 10*3/uL (ref 0.2–1.0)
Monocytes Relative: 9 %
NEUTROS ABS: 7.2 10*3/uL — AB (ref 1.4–6.5)
NEUTROS PCT: 61 %
Platelets: 347 10*3/uL (ref 150–440)
RBC: 3.59 MIL/uL — AB (ref 4.40–5.90)
RDW: 17.5 % — ABNORMAL HIGH (ref 11.5–14.5)
WBC: 11.6 10*3/uL — AB (ref 3.8–10.6)

## 2016-02-07 MED ORDER — MORPHINE SULFATE (PF) 4 MG/ML IV SOLN
4.0000 mg | Freq: Once | INTRAVENOUS | Status: AC
  Administered 2016-02-07: 4 mg via INTRAVENOUS
  Filled 2016-02-07: qty 1

## 2016-02-07 MED ORDER — ONDANSETRON HCL 4 MG/2ML IJ SOLN
4.0000 mg | Freq: Once | INTRAMUSCULAR | Status: AC
  Administered 2016-02-07: 4 mg via INTRAVENOUS

## 2016-02-07 MED ORDER — SODIUM CHLORIDE 0.9 % IV BOLUS (SEPSIS)
1000.0000 mL | Freq: Once | INTRAVENOUS | Status: AC
  Administered 2016-02-07: 1000 mL via INTRAVENOUS

## 2016-02-07 MED ORDER — SODIUM CHLORIDE 0.9 % IV BOLUS (SEPSIS): 1000.0000 mL | Freq: Once | INTRAVENOUS | Status: DC

## 2016-02-07 MED ORDER — ONDANSETRON HCL 4 MG/2ML IJ SOLN
INTRAMUSCULAR | Status: AC
Start: 2016-02-07 — End: 2016-02-07
  Administered 2016-02-07: 4 mg via INTRAVENOUS
  Filled 2016-02-07: qty 2

## 2016-02-07 MED ORDER — LEVOFLOXACIN IN D5W 750 MG/150ML IV SOLN
750.0000 mg | Freq: Once | INTRAVENOUS | Status: AC
  Administered 2016-02-07: 750 mg via INTRAVENOUS
  Filled 2016-02-07: qty 150

## 2016-02-07 MED ORDER — SODIUM CHLORIDE 0.9 % IV SOLN
3.0000 g | Freq: Once | INTRAVENOUS | Status: AC
  Administered 2016-02-07: 3 g via INTRAVENOUS
  Filled 2016-02-07: qty 3

## 2016-02-07 MED ORDER — LIDOCAINE VISCOUS 2 % MT SOLN
15.0000 mL | Freq: Once | OROMUCOSAL | Status: AC
  Administered 2016-02-07: 15 mL via OROMUCOSAL
  Filled 2016-02-07: qty 15

## 2016-02-07 NOTE — ED Notes (Signed)
Pt here with difficulty swallowing today, reports hx of throat cancer; last chemo tx was in November. Pt reports real bad cough x1 week, green sputum.

## 2016-02-07 NOTE — ED Provider Notes (Signed)
[facility] Emergency Department Provider Note    ____________________________________________  Time seen: ~1855  I have reviewed the triage vital signs and the nursing notes.   HISTORY  Chief Complaint Dysphagia and Cough   History limited by: Not Limited   HPI Grant Lee is a 60 y.o. male with history of throat cancer presents to the emergency department today because of concerns for occult the with swallowing, cough, fevers and chills. Patient has baseline difficulty with swallowing is only really allowed to have a prescription at this point. He does have a feeding tube in place. He states that he has noticed for the past couple of days however that it is gotten worse. He can't even really control the ice cubes. In addition for the past week the patient has had a cough productive phlegm. It has been a thick greenish phlegm. He is also felt like he has had some fevers and chills at this time. He states he was treated and admitted to the hospital for a pneumonia last year.    Past Medical History  Diagnosis Date  . Hypertension   . Pneumonia     2004  . GERD (gastroesophageal reflux disease)   . Arthritis   . Anemia     blood transfusion 2012  . Bleeding ulcer   . Machinery accident     LEG INJURY  . Cancer [facility])     mouth 2006 then again 2016    Patient Active Problem List   Diagnosis Date Noted  . Pneumonia 10/02/2015  . Malignant neoplasm of tongue ([place]) 08/30/2015  . PUD (peptic ulcer disease) 08/08/2015  . Acute esophagitis 08/08/2015  . Chronic pain syndrome 08/08/2015  . Esophagitis 08/08/2015  . Chemotherapy induced nausea and vomiting 08/03/2015  . N&V (nausea and vomiting) 08/03/2015  . Cancer of tongue ([place])   . Protein-calorie malnutrition, severe ([place]) 07/05/2015  . Severe protein-calorie malnutrition [employer]: less than 60% of standard weight) ([place]) 07/05/2015  . Malnutrition ([place]) 07/03/2015  . Aspiration  pneumonitis ([place]) 07/03/2015  . Pneumonitis due to inhalation of food or vomitus ([place]) 07/03/2015  . DU (duodenal ulcer) 06/11/2015  . Tongue cancer ([place]) 06/11/2015  . Duodenal ulcer without hemorrhage or perforation 06/11/2015  . Abnormal loss of weight 10/09/2014    Past Surgical History  Procedure Laterality Date  . Leg surgery    . Joint replacement      right hip  . Tongue biopsy    . Tongue surgery      multiple surgeries  . Direct laryngoscopy N/A 06/26/2015    Procedure: Laryngoscopy with tongue base biopsy ;  Surgeon: Beverly Gust, MD;  Location: [facility] ORS;  Service: ENT;  Laterality: N/A;  . Esophagogastroduodenoscopy N/A 07/06/2015    Procedure: ESOPHAGOGASTRODUODENOSCOPY (EGD) and percutaneous gastrostomy tyube placement;  Surgeon: Lollie Sails, MD;  Location: [facility] ENDOSCOPY;  Service: Endoscopy;  Laterality: N/A;  Residual need to be done in the afternoon on 07/06/2015. Combined procedure with Dr. Gustavo Lah and Dr Rayann Heman  . Peripheral vascular catheterization N/A 07/27/2015    Procedure: Glori Luis Cath Insertion;  Surgeon: Katha Cabal, MD;  Location: [place] CV LAB;  Service: Cardiovascular;  Laterality: N/A;  . Esophagogastroduodenoscopy (egd) with propofol N/A 08/07/2015    Procedure: ESOPHAGOGASTRODUODENOSCOPY (EGD) WITH PROPOFOL;  Surgeon: Lollie Sails, MD;  Location: [facility] ENDOSCOPY;  Service: Endoscopy;  Laterality: N/A;  . Peg placement N/A 10/08/2015    Procedure: PERCUTANEOUS ENDOSCOPIC GASTROSTOMY (PEG) PLACEMENT;  Surgeon: Gavin Pound  Vira Agar, MD;  Location: [place] [facility] ENDOSCOPY;  Service: Endoscopy;  Laterality: N/A;    Current Outpatient Rx  Name  Route  Sig  Dispense  Refill  . acidophilus (RISAQUAD) CAPS capsule   Oral   Take 1 capsule by mouth at bedtime.          . Benzocaine (ANBESOL MT)   Other   1 application by Other route as needed (for gum pain).         . chlorhexidine (PERIDEX) 0.12 % solution   Mouth Rinse   15 mLs by Mouth Rinse  route 2 (two) times daily.   120 mL   0   . citalopram (CELEXA) 20 MG tablet   Oral   Take 1 tablet (20 mg total) by mouth daily.   30 tablet   3   . lidocaine-prilocaine (EMLA) cream   Topical   Apply 1 application topically as needed.   30 g   3   . lisinopril (PRINIVIL,ZESTRIL) 10 MG tablet   Oral   Take 10 mg by mouth at bedtime.          . Nutritional Supplements (FEEDING SUPPLEMENT, OSMOLITE 1.5 CAL,) LIQD   Per Tube   Place 240 mLs into feeding tube 5 (five) times daily.   150 Bottle   3   . omeprazole (PRILOSEC) 20 MG capsule   Oral   Take 1 capsule (20 mg total) by mouth 2 (two) times daily before a meal.   60 capsule   3   . ondansetron (ZOFRAN) 4 MG tablet   Oral   Take 1 tablet (4 mg total) by mouth every 4 (four) hours as needed for nausea or vomiting.   45 tablet   0   . oxycodone (ROXICODONE) 30 MG immediate release tablet      Take one tablet by mouth up to 7 (seven) times daily as needed for pain   210 tablet   0     Dispense as written.    DO NOT FILL UNTIL 02/08/2016   . pantoprazole (PROTONIX) 40 MG tablet      TAKE 1 TABLET BY MOUTH TWICE DAILY   60 tablet   0   . phenol (CHLORASEPTIC) 1.4 % LIQD   Mouth/Throat   Use as directed 1 spray in the mouth or throat as needed for throat irritation / pain.   177 mL   0   . polyvinyl alcohol (LIQUIFILM TEARS) 1.4 % ophthalmic solution   Both Eyes   Place 1 drop into both eyes as needed for dry eyes.   15 mL   0   . pravastatin (PRAVACHOL) 40 MG tablet   Oral   Take 40 mg by mouth at bedtime.          . promethazine (PHENERGAN) 25 MG suppository   Rectal   Place 1 suppository (25 mg total) rectally every 8 (eight) hours as needed for nausea or vomiting.   12 each   0   . sucralfate (CARAFATE) 1 GM/10ML suspension   Oral   Take 10 mLs (1 g total) by mouth 4 (four) times daily -  with meals and at bedtime.   420 mL   0     Cannot swallow pills   . traZODone (DESYREL) 100  MG tablet   Oral   Take 100 mg by mouth at bedtime.           Allergies Review of patient's allergies indicates no known allergies.  Family History  Problem Relation Age of Onset  . CAD Mother   . CAD Father     Social History Social History  Substance Use Topics  . Smoking status: Former Smoker -- 1.00 packs/day for 0 years    Types: Cigarettes    Quit date: 09/21/2014  . Smokeless tobacco: None  . Alcohol Use: 7.2 oz/week    12 Cans of beer per week     Comment: beer/wine every day    Review of Systems  Constitutional: Positive for fever and chills Cardiovascular: Negative for chest pain. Respiratory: Positive for productive cough Gastrointestinal: Negative for abdominal pain, vomiting and diarrhea. Neurological: Negative for headaches, focal weakness or numbness.   10-point ROS otherwise negative.  ____________________________________________   PHYSICAL EXAM:  VITAL SIGNS: ED Triage Vitals  Enc Vitals Group     BP 02/07/16 1705 121/75 mmHg     Pulse Rate 02/07/16 1705 88     Resp 02/07/16 1705 16     Temp 02/07/16 1705 97.6 F (36.4 C)     Temp Source 02/07/16 1705 Oral     SpO2 02/07/16 1705 97 %     Weight 02/07/16 1705 114 lb (51.71 kg)     Height 02/07/16 1705 '6\' 2"'$  (1.88 m)     Head Cir --      Peak Flow --      Pain Score 02/07/16 1705 10   Constitutional: Alert and oriented. Chronically ill appearing. Cachectic.  Eyes: Conjunctivae are normal. PERRL. Normal extraocular movements. ENT   Head: Normocephalic and atraumatic.   Nose: No congestion/rhinnorhea.   Mouth/Throat: Mucous membranes are moist.   Neck: No stridor. Hematological/Lymphatic/Immunilogical: No cervical lymphadenopathy. Cardiovascular: Normal rate, regular rhythm.  No murmurs, rubs, or gallops. Respiratory: Normal respiratory effort without tachypnea nor retractions. Slight rhonchi in bilateral bases.  Gastrointestinal: Soft and nontender. No distention.   Genitourinary: Deferred Musculoskeletal: Normal range of motion in all extremities. No joint effusions.  No lower extremity tenderness nor edema. Neurologic:  Normal speech and language. No gross focal neurologic deficits are appreciated.  Skin:  Skin is warm, dry and intact. No rash noted. Psychiatric: Mood and affect are normal. Speech and behavior are normal. Patient exhibits appropriate insight and judgment.  ____________________________________________    LABS (pertinent positives/negatives)  Labs Reviewed  CBC WITH DIFFERENTIAL/PLATELET - Abnormal; Notable for the following:    WBC 11.6 (*)    RBC 3.59 (*)    Hemoglobin 9.2 (*)    HCT 28.4 (*)    MCV 79.1 (*)    MCH 25.6 (*)    RDW 17.5 (*)    Neutro Abs 7.2 (*)    All other components within normal limits  BASIC METABOLIC PANEL - Abnormal; Notable for the following:    Sodium 132 (*)    Chloride 100 (*)    Glucose, Bld 107 (*)    Creatinine, Ser 0.43 (*)    Calcium 8.5 (*)    All other components within normal limits  CULTURE, BLOOD (ROUTINE X 2)  CULTURE, BLOOD (ROUTINE X 2)     ____________________________________________   EKG  None  ____________________________________________    RADIOLOGY  CXR IMPRESSION: 1. Persistent bilateral lower lobe pneumonia with trace bilateral pleural effusions.  ____________________________________________   PROCEDURES  Procedure(s) performed: None  Critical Care performed: No  ____________________________________________   INITIAL IMPRESSION / ASSESSMENT AND PLAN / ED COURSE  Pertinent labs & imaging results that were available during my care of the patient were  reviewed by me and considered in my medical decision making (see chart for details).  Patient presented to the emergency department today with increased difficulty with swallowing, productive cough and subjective fevers and chills. On exam patient chronically ill-appearing and cachectic. X-rays  concerning for persistent bilateral lower lobe pneumonias. I do have some concerns of these could represent aspiration pneumonia. Patient with some leukocytosis on blood work. We will give IV fluids and plan on admission to the hospital service for further workup and evaluation.  ____________________________________________   FINAL CLINICAL IMPRESSION(S) / ED DIAGNOSES  Final diagnoses:  Aspiration pneumonia, unspecified aspiration pneumonia type, unspecified laterality, unspecified part of lung ([place])     Nance Pear, MD 02/07/16 2244

## 2016-02-08 ENCOUNTER — Ambulatory Visit: Payer: Medicare Other

## 2016-02-08 DIAGNOSIS — E871 Hypo-osmolality and hyponatremia: Secondary | ICD-10-CM | POA: Diagnosis present

## 2016-02-08 DIAGNOSIS — G8929 Other chronic pain: Secondary | ICD-10-CM | POA: Diagnosis present

## 2016-02-08 DIAGNOSIS — R1310 Dysphagia, unspecified: Secondary | ICD-10-CM

## 2016-02-08 DIAGNOSIS — C069 Malignant neoplasm of mouth, unspecified: Secondary | ICD-10-CM | POA: Diagnosis present

## 2016-02-08 DIAGNOSIS — J690 Pneumonitis due to inhalation of food and vomit: Secondary | ICD-10-CM | POA: Diagnosis present

## 2016-02-08 LAB — CREATININE, SERUM
Creatinine, Ser: 0.5 mg/dL — ABNORMAL LOW (ref 0.61–1.24)
GFR calc non Af Amer: >60 mL/min (ref >60)

## 2016-02-08 LAB — CBC
HEMATOCRIT: 29 % — AB (ref 40.0–52.0)
HEMOGLOBIN: 9.4 g/dL — AB (ref 13.0–18.0)
MCH: 25.5 pg — ABNORMAL LOW (ref 26.0–34.0)
MCHC: 32.6 g/dL (ref 32.0–36.0)
MCV: 78.4 fL — AB (ref 80.0–100.0)
Platelets: 294 10*3/uL (ref 150–440)
RBC: 3.69 MIL/uL — ABNORMAL LOW (ref 4.40–5.90)
RDW: 17.2 % — AB (ref 11.5–14.5)
WBC: 8.7 10*3/uL (ref 3.8–10.6)

## 2016-02-08 MED ORDER — GUAIFENESIN 100 MG/5ML PO SOLN: 5.0000 mL | ORAL | Status: DC | PRN

## 2016-02-08 MED ORDER — LISINOPRIL 10 MG PO TABS
10.0000 mg | ORAL_TABLET | Freq: Every day | ORAL | Status: DC
  Administered 2016-02-08: 10 mg via ORAL
  Filled 2016-02-08: qty 1

## 2016-02-08 MED ORDER — OXYCODONE HCL 5 MG PO TABS
30.0000 mg | ORAL_TABLET | Freq: Four times a day (QID) | ORAL | Status: DC | PRN
Start: 2016-02-08 — End: 2016-02-09
  Administered 2016-02-08 – 2016-02-09 (×5): 30 mg via ORAL
  Filled 2016-02-08 (×5): qty 6

## 2016-02-08 MED ORDER — ENOXAPARIN SODIUM 40 MG/0.4ML ~~LOC~~ SOLN
40.0000 mg | Freq: Every day | SUBCUTANEOUS | Status: DC
Start: 2016-02-08 — End: 2016-02-09
  Administered 2016-02-08 (×2): 40 mg via SUBCUTANEOUS
  Filled 2016-02-08 (×2): qty 0.4

## 2016-02-08 MED ORDER — PHENOL 1.4 % MT LIQD
1.0000 | OROMUCOSAL | Status: DC | PRN
  Filled 2016-02-08: qty 177

## 2016-02-08 MED ORDER — HYDROMORPHONE HCL 1 MG/ML IJ SOLN
1.0000 mg | INTRAMUSCULAR | Status: DC | PRN
  Administered 2016-02-08 – 2016-02-09 (×4): 1 mg via INTRAVENOUS
  Filled 2016-02-08 (×4): qty 1

## 2016-02-08 MED ORDER — IPRATROPIUM-ALBUTEROL 0.5-2.5 (3) MG/3ML IN SOLN
3.0000 mL | RESPIRATORY_TRACT | Status: DC
  Administered 2016-02-08 (×4): 3 mL via RESPIRATORY_TRACT
  Filled 2016-02-08 (×4): qty 3

## 2016-02-08 MED ORDER — PRAVASTATIN SODIUM 40 MG PO TABS
40.0000 mg | ORAL_TABLET | Freq: Every day | ORAL | Status: DC
  Administered 2016-02-08: 40 mg via ORAL
  Filled 2016-02-08: qty 1

## 2016-02-08 MED ORDER — SODIUM CHLORIDE 0.9 % IV SOLN
INTRAVENOUS | Status: DC
  Administered 2016-02-08 (×2): via INTRAVENOUS

## 2016-02-08 MED ORDER — MORPHINE SULFATE (PF) 4 MG/ML IV SOLN
4.0000 mg | INTRAVENOUS | Status: DC | PRN
  Administered 2016-02-08 (×2): 4 mg via INTRAVENOUS
  Filled 2016-02-08 (×2): qty 1

## 2016-02-08 MED ORDER — POLYVINYL ALCOHOL 1.4 % OP SOLN
1.0000 [drp] | OPHTHALMIC | Status: DC | PRN
  Filled 2016-02-08: qty 15

## 2016-02-08 MED ORDER — IPRATROPIUM-ALBUTEROL 0.5-2.5 (3) MG/3ML IN SOLN: 3.0000 mL | RESPIRATORY_TRACT | Status: DC | PRN

## 2016-02-08 MED ORDER — CITALOPRAM HYDROBROMIDE 20 MG PO TABS
20.0000 mg | ORAL_TABLET | Freq: Every day | ORAL | Status: DC
  Administered 2016-02-08 – 2016-02-09 (×2): 20 mg via ORAL
  Filled 2016-02-08 (×2): qty 1

## 2016-02-08 MED ORDER — OSMOLITE 1.5 CAL PO LIQD
240.0000 mL | Freq: Every day | ORAL | Status: DC
  Administered 2016-02-08 (×2): 240 mL

## 2016-02-08 MED ORDER — VANCOMYCIN HCL IN DEXTROSE 1-5 GM/200ML-% IV SOLN
1000.0000 mg | Freq: Once | INTRAVENOUS | Status: AC
  Administered 2016-02-08: 04:00:00 1000 mg via INTRAVENOUS
  Filled 2016-02-08: qty 200

## 2016-02-08 MED ORDER — PANTOPRAZOLE SODIUM 40 MG PO TBEC
40.0000 mg | DELAYED_RELEASE_TABLET | Freq: Two times a day (BID) | ORAL | Status: DC
  Administered 2016-02-08 – 2016-02-09 (×3): 40 mg via ORAL
  Filled 2016-02-08 (×3): qty 1

## 2016-02-08 MED ORDER — MORPHINE SULFATE (PF) 2 MG/ML IV SOLN
2.0000 mg | INTRAVENOUS | Status: DC | PRN
  Administered 2016-02-08 (×2): 2 mg via INTRAVENOUS
  Filled 2016-02-08 (×3): qty 1

## 2016-02-08 MED ORDER — ONDANSETRON HCL 4 MG PO TABS: 4.0000 mg | ORAL_TABLET | ORAL | Status: DC | PRN

## 2016-02-08 MED ORDER — SODIUM CHLORIDE 0.9 % IV SOLN
3.0000 g | Freq: Four times a day (QID) | INTRAVENOUS | Status: DC
  Administered 2016-02-08 – 2016-02-09 (×3): 3 g via INTRAVENOUS
  Filled 2016-02-08 (×7): qty 3

## 2016-02-08 MED ORDER — LEVOFLOXACIN IN D5W 750 MG/150ML IV SOLN
750.0000 mg | INTRAVENOUS | Status: DC
  Filled 2016-02-08: qty 150

## 2016-02-08 MED ORDER — VANCOMYCIN HCL IN DEXTROSE 1-5 GM/200ML-% IV SOLN
1000.0000 mg | Freq: Two times a day (BID) | INTRAVENOUS | Status: DC
  Administered 2016-02-08: 09:00:00 1000 mg via INTRAVENOUS
  Filled 2016-02-08 (×2): qty 200

## 2016-02-08 MED ORDER — TRAZODONE HCL 100 MG PO TABS
100.0000 mg | ORAL_TABLET | Freq: Every day | ORAL | Status: DC
  Administered 2016-02-08: 22:00:00 100 mg via ORAL
  Filled 2016-02-08: qty 1

## 2016-02-08 MED ORDER — OSMOLITE 1.5 CAL PO LIQD
237.0000 mL | Freq: Every day | ORAL | Status: DC
  Administered 2016-02-08 – 2016-02-09 (×5): 237 mL

## 2016-02-08 MED ORDER — CETYLPYRIDINIUM CHLORIDE 0.05 % MT LIQD
7.0000 mL | Freq: Two times a day (BID) | OROMUCOSAL | Status: DC
  Administered 2016-02-08 (×2): 7 mL via OROMUCOSAL

## 2016-02-08 NOTE — Progress Notes (Addendum)
Speech Therapy Note: received order, reviewed extensively his chart notes involving the multiple MBSSs pt has had since 06/2015 assessing his swallow function. Pt appears to present w/ Severe pharyngeal phase deficits of swallowing; mild oral phase deficits of swallowing. The degree of pharyngeal phase dysphagia c/b delayed pharyngeal swallow initiation and decreased laryngeal excursion and pharyngeal pressure(BOT contact/strength to the pharyngeal wall) resulted in laryngeal Penetration and Aspiration of bolus material of both food and liquids. Pt was unable to consistently protect his airway, and this could result in significant decline in respiratory status including aspiration pneumonia. Suspect pt's oropharyngeal phase dysphagia is directly related to his tongue/throat Ca w/ Radiation tx to this area. Due to pt's inability to protect his airway, recommend NPO status w/ alternative means of feeding. Pt was given dysphagia swallowing exercises post last MBSS in Feb. 2017 to hopefully improve pharyngeal swallowing.  As pt is having difficulty swallowing and protecting his airway against pleasure sips of water and/or ice chips, and this is resulting in a declined respiratory status w/ admission to the hospital(dx of Aspiration Pneumonia),rec. Discontinue any pleasure po's and remain NPO w/ TFs for nutrition/hydration; frequent oral care for hygiene and comfort.  Strongly rec. A Palliative Care consult to discuss pt's goals for himself re: pleasure po's, and the risks of such including Aspiration Pneumonia. MD consulted and agreed.

## 2016-02-08 NOTE — Progress Notes (Signed)
Pharmacy Antibiotic Note  Grant Lee is a 60 y.o. male admitted on 02/07/2016 with pneumonia.  Pharmacy has been consulted for levofloxacin and vancomycin dosing.  Plan: 1. Levofloxacin 750 mg IV Q24H 2. Vancomycin 1 gm IV Q12H with stacked dosing second dose approximately 6 hours after first, predicted trough 17 mcg/mL. Pharmacy will continue to follow and adjust as needed to maintain trough 15 to 20 mcg/mL.   Vd 49 L, Ke 0.065 hr-1, T1/2 10.7 hr  Height: '6\' 2"'$  (188 cm) Weight: 114 lb (51.71 kg) IBW/kg (Calculated) : 82.2  Temp (24hrs), Avg:97.7 F (36.5 C), Min:97.6 F (36.4 C), Max:97.7 F (36.5 C)   Recent Labs Lab 02/05/16 1504 02/07/16 2014  WBC 8.9 11.6*  CREATININE 0.65 0.43*    Estimated Creatinine Clearance: 72.7 mL/min (by C-G formula based on Cr of 0.43).    No Known Allergies  Thank you for allowing pharmacy to be a part of this patient's care.  Laural Benes, Pharm.D., BCPS Clinical Pharmacist 02/08/2016 2:26 AM

## 2016-02-08 NOTE — Care Management (Signed)
Admitted to [facility] with the diagnosis of aspiration pneumonia. Lives with wife, Altha Harm [phone]). Last seen Dr. Tobie Lords 5-6 months ago. Home Health and feeds through [facility]. [place] November-December 2016. Home oxygen through [facility]. Uses as needed. No Life Alert. Takes care of all basic activities of daily living herself, drives when needed. No falls. Appetite O.K. States he got his PEG in 2016. Wife will transport. Shelbie Ammons RN MSN CCM Care Management [phone]

## 2016-02-08 NOTE — Progress Notes (Signed)
   02/08/16 1315  Clinical Encounter Type  Visited With Patient  Visit Type Initial  Referral From Nurse  Consult/Referral To Chaplain  Spiritual Encounters  Spiritual Needs Emotional  Stress Factors  Patient Stress Factors Exhausted;Health changes  Visited patient to assess spiritual care needs. Patient appeared tired and I advised that I would visit at a mover convenient time.  Chap. Cressie Betzler G. [place]

## 2016-02-08 NOTE — Progress Notes (Signed)
Initial Nutrition Assessment  DOCUMENTATION CODES:   Severe malnutrition in context of chronic illness  INTERVENTION:   EN: Osmolite 1.5 cans 5 per day to provide 1777kcals and 75g protein. RD spoke with pt about the possibility of increasing to 6 cans per day to optimize nutritional needs and promote weight gain. Recommend monitoring tolerance of 5 cans before increasing; 6 cans of Osmolite 1.5 would provide 2133kcals and 90g protein.  Will add free water before and after each bolus and will increase free water between boluses once pt off IVF. Pt reports tolerating Osmolite very well versus Jevity, which gave pt diarrhea in the past. Coordination of Care: pt reports to writer that he has been strictly NPO PTA except ice chips. RD notes SLP evaluation ordered. Will follow along.   NUTRITION DIAGNOSIS:   Malnutrition related to cancer and cancer related treatments, chronic illness as evidenced by severe depletion of muscle mass, severe depletion of body fat, percent weight loss.  GOAL:   Patient will meet greater than or equal to 90% of their needs  MONITOR:   Diet advancement, PO intake, Labs, Weight trends, TF tolerance, I & O's  REASON FOR ASSESSMENT:   Consult Enteral/tube feeding initiation and management  ASSESSMENT:   Pt admitted with likely aspiration pna as per MD note pt reports choking on ice chips. Pt with h/o tongue cancer, s/p last chemotherapy and radiation November 2016. Pt with PEG for enteral nutrition.  Past Medical History  Diagnosis Date  . Hypertension   . Pneumonia     2004  . GERD (gastroesophageal reflux disease)   . Arthritis   . Anemia     blood transfusion 2012  . Bleeding ulcer   . Machinery accident     LEG INJURY  . Cancer ([place])     mouth 2006 then again 2016  . Protein-calorie malnutrition, severe ([place]) 07/05/2015  . Chronic pain syndrome 08/08/2015  . PUD (peptic ulcer disease) 08/08/2015    Diet Order:    No current diet  order   Food/Nutrition-Related History: Pt reports taking and tolerating 5 cans of Osmolite 1.5 with free water before and after each bolus without difficulty at home PTA. Pt reports he has been strictly NPO PTA. Per MD note pt choking on sips of water or ice chips.    Scheduled Medications:  . antiseptic oral rinse  7 mL Mouth Rinse q12n4p  . citalopram  20 mg Oral Daily  . enoxaparin (LOVENOX) injection  40 mg Subcutaneous QHS  . feeding supplement (OSMOLITE 1.5 CAL)  237 mL Per Tube 5 X Daily  . ipratropium-albuterol  3 mL Nebulization Q4H  . levofloxacin (LEVAQUIN) IV  750 mg Intravenous Q24H  . lisinopril  10 mg Oral QHS  . pantoprazole  40 mg Oral BID AC  . pravastatin  40 mg Oral QHS  . traZODone  100 mg Oral QHS  . vancomycin  1,000 mg Intravenous Q12H    Continuous Medications:  . sodium chloride 75 mL/hr at 02/08/16 0235     Electrolyte/Renal Profile and Glucose Profile:   Recent Labs Lab 02/05/16 1504 02/07/16 2014 02/08/16 0245  NA 133* 132*  --   K 4.3 4.1  --   CL 97* 100*  --   CO2 31 26  --   BUN 19 18  --   CREATININE 0.65 0.43* 0.50*  CALCIUM 8.6* 8.5*  --   GLUCOSE 87 107*  --    Protein Profile:   Recent Labs  Lab 02/05/16 1504  ALBUMIN 2.9*    Gastrointestinal Profile: Last BM:  02/07/2016   Nutrition-Focused Physical Exam Findings: Nutrition-Focused physical exam completed. Findings are severe fat depletion, severe muscle depletion, and no edema.     Weight Change: Pt reports weight of 127lbs 3 weeks ago. Pt reports tolerating TF very well and was pleased to be gaining weight, however in the past 3 weeks pt reports losing weight. Current measured weight of 119lbs (6% weight loss in 3 weeks)   Skin:  Reviewed, no issues   Height:   Ht Readings from Last 1 Encounters:  02/08/16 '6\' 2"'$  (1.88 m)    Weight:   Wt Readings from Last 1 Encounters:  02/08/16 119 lb 12.8 oz (54.341 kg)   Wt Readings from Last 10 Encounters:  02/08/16  119 lb 12.8 oz (54.341 kg)  02/05/16 118 lb 6.2 oz (53.7 kg)  01/01/16 124 lb (56.246 kg)  12/13/15 114 lb 3 oz (51.795 kg)  11/15/15 118 lb 4 oz (53.638 kg)  11/01/15 112 lb 4 oz (50.916 kg)  10/29/15 105 lb (47.628 kg)  10/24/15 120 lb (54.432 kg)  10/10/15 106 lb 1.6 oz (48.127 kg)  09/27/15 115 lb 4.8 oz (52.3 kg)    Ideal Body Weight:   86kg  BMI:  Body mass index is 15.37 kg/(m^2).  Estimated Nutritional Needs:   Kcal:  2200-2580kcals, using IBW of 86kg  Protein:  95-111g protein (1.1-1.3g/kg)  Fluid:  2.1-2.5L fluid  EDUCATION NEEDS:   No education needs identified at this time  Dwyane Luo, RD, LDN Pager [phone] Weekend/On-Call Pager [phone]

## 2016-02-08 NOTE — Progress Notes (Signed)
Pain  Several times for jaw / neck pain from throat ca.  Some relief. tol tube feedings well

## 2016-02-08 NOTE — Care Management Important Message (Signed)
Important Message  Patient Details  Name: Grant Lee MRN: [id number] Date of Birth: November 16, 1955   Medicare Important Message Given:  Yes    Shelbie Ammons, RN 02/08/2016, 9:11 AM

## 2016-02-08 NOTE — Progress Notes (Signed)
[facility] [place] NAME: Grant Lee    MRN#:  [id number]  DATE OF BIRTH:  [birth year]  SUBJECTIVE:  Hospital Day: 1 day Grant Lee is a 60 y.o. male presenting with Dysphagia and Cough .   Overnight events: Complains of throat pain otherwise no significant events Interval Events: Still has complaints of throat pain states this is new-somewhat difficult to get history given patient's speech  REVIEW OF SYSTEMS:  CONSTITUTIONAL: No fever,Positive fatigue or weakness.  EYES: No blurred or double vision.  EARS, NOSE, AND THROAT: No tinnitus or ear pain. Positive dysphagia/odynophagia RESPIRATORY: Positive cough, shortness of breath, denies wheezing or hemoptysis.  CARDIOVASCULAR: No chest pain, orthopnea, edema.  GASTROINTESTINAL: No nausea, vomiting, diarrhea or abdominal pain.  GENITOURINARY: No dysuria, hematuria.  ENDOCRINE: No polyuria, nocturia,  HEMATOLOGY: No anemia, easy bruising or bleeding SKIN: No rash or lesion. MUSCULOSKELETAL: No joint pain or arthritis.   NEUROLOGIC: No tingling, numbness, weakness.  PSYCHIATRY: No anxiety or depression.   DRUG ALLERGIES:  No Known Allergies  VITALS:  Blood pressure 150/87, pulse 83, temperature 98.2 F (36.8 C), temperature source Oral, resp. rate 20, height '6\' 2"'$  (1.88 m), weight 54.341 kg (119 lb 12.8 oz), SpO2 97 %.  PHYSICAL EXAMINATION:  VITAL SIGNS: Filed Vitals:   02/08/16 0836 02/08/16 1218  BP: 139/89 150/87  Pulse: 97 83  Temp: 98.6 F (37 C) 98.2 F (36.8 C)  Resp: 20    GENERAL:59 y.o.male currently in no acute distress. Chronically ill appearing HEAD: Normocephalic, atraumatic.  EYES: Pupils equal, round, reactive to light. Extraocular muscles intact. No scleral icterus.  MOUTH: Moist mucosal membrane. Dentition intact. No abscess noted.  EAR, NOSE, THROAT: Clear without exudates. No external lesions.  NECK: Supple. No thyromegaly. No  nodules. No JVD.  PULMONARY: Diminished breath sounds with basilar rhonchi without wheeze  No use of accessory muscles, Good respiratory effort. good air entry bilaterally CHEST: Nontender to palpation.  CARDIOVASCULAR: S1 and S2. Regular rate and rhythm. No murmurs, rubs, or gallops. No edema. Pedal pulses 2+ bilaterally.  GASTROINTESTINAL: Soft, nontender, nondistended. No masses. Positive bowel sounds. No hepatosplenomegaly.  MUSCULOSKELETAL: No swelling, clubbing, or edema. Range of motion full in all extremities.  NEUROLOGIC: Cranial nerves II through XII are intact. No gross focal neurological deficits. Sensation intact. Reflexes intact.  SKIN: No ulceration, lesions, rashes, or cyanosis. Skin warm and dry. Turgor intact.  PSYCHIATRIC: Mood, affect within normal limits. The patient is awake, alert and oriented x 3. Insight, judgment intact.      LABORATORY PANEL:   CBC  Recent Labs Lab 02/08/16 0245  WBC 8.7  HGB 9.4*  HCT 29.0*  PLT 294   ------------------------------------------------------------------------------------------------------------------  Chemistries   Recent Labs Lab 02/05/16 1504 02/07/16 2014 02/08/16 0245  NA 133* 132*  --   K 4.3 4.1  --   CL 97* 100*  --   CO2 31 26  --   GLUCOSE 87 107*  --   BUN 19 18  --   CREATININE 0.65 0.43* 0.50*  CALCIUM 8.6* 8.5*  --   AST 18  --   --   ALT 10*  --   --   ALKPHOS 69  --   --   BILITOT 0.2*  --   --    ------------------------------------------------------------------------------------------------------------------  Cardiac Enzymes No results for input(s): TROPONINI in the last 168 hours. ------------------------------------------------------------------------------------------------------------------  RADIOLOGY:  Dg Chest 2 View  02/07/2016  CLINICAL DATA:  59 year old male with shortness of breath and cough productive of green sputum. EXAM: CHEST  2 VIEW COMPARISON:  Chest x-ray 01/01/2016.  FINDINGS: There continues to be airspace consolidation in the medial aspect of the left lower lobe and in the right lower lobe, which appears very similar to the prior study from 01/01/2016. Trace bilateral pleural effusions are noted. There is some architectural distortion in the region of the right middle lobe, which likely reflects post infectious or inflammatory scarring. No evidence of pulmonary edema. No pneumothorax. No definite suspicious appearing pulmonary nodules or masses. Heart size is normal. Upper mediastinal contours are within normal limits. Right internal jugular single-lumen porta cath with tip terminating in the right atrium. IMPRESSION: 1. Persistent bilateral lower lobe pneumonia with trace bilateral pleural effusions. Electronically Signed   By: Grant Lee M.D.   On: 02/07/2016 17:59    EKG:   Orders placed or performed during the hospital encounter of 10/02/15  . ED EKG  . ED EKG  . EKG 12-Lead  . EKG 12-Lead    ASSESSMENT AND PLAN:   Grant Lee is a 60 y.o. male presenting with Dysphagia and Cough . Admitted 02/07/2016 : Day #: 1 day 1. Aspiration pneumonitis: Patient does not have a significant oxygen requirement at this time we'll downgrade antibiotics to Unasyn, continue follow culture data can likely discontinue antibiotics completely resolve his cultures remain normal provide supplemental oxygen, breathing treatments 2. Dysphagia: We'll have speech eval, patient states he is supposed to be strict nothing by mouth at home 3. Protein calorie malnutrition severe: Dietary for tube feedings 4. Oral cancer: Now with pain, pain medications as required 5. Venous thromboembolism prophylactic: Lovenox   All the records are reviewed and case discussed with Care Management/Social Workerr. Management plans discussed with the patient, family and they are in agreement.  CODE STATUS: full TOTAL TIME TAKING CARE OF THIS PATIENT: 28 minutes.   POSSIBLE D/C IN  1-2DAYS, DEPENDING ON CLINICAL CONDITION.   Grant Lee,  Grant Lee on 02/08/2016 at 3:12 PM  Between 7am to 6pm - Pager - [phone]  After 6pm: House Pager: - [phone]  [facility]  Office  [phone]  CC: Primary care physician; Grant Lee

## 2016-02-08 NOTE — H&P (Signed)
[facility] at [place] NAME: Grant Lee    MR#:  [id number]  DATE OF BIRTH:  [birth year]  DATE OF ADMISSION:  02/07/2016  PRIMARY CARE PHYSICIAN: Fae Pippin   REQUESTING/REFERRING PHYSICIAN: Dr. Archie Balboa  CHIEF COMPLAINT:   Chief Complaint  Patient presents with  . Dysphagia  . Cough    HISTORY OF PRESENT ILLNESS: Grant Lee  is a 60 y.o. 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 Patient presents to the ED from home due to cough for the last 5-6 days. Cough was preceded by worsening dysphagia. Patient noticed that anytime he tries to sit water or ice cubes, he chokes. Since then, he has been having cough that is productive of brownish sputum that is devoid of blood, fever, chills and night sweats. He denies any recent travel or contact with sick persons. He has chronic weakness that has slightly worsened recently. He denies any chest pain except when he coughs deeply, palpitations, nausea, vomiting, abdominal pain or changes in bowel or urine habits. He has no other complaints.  On arrival in the ED, vital signs are stable and CBC was unremarkable. CMP was only significant for a sodium of 132. Blood cultures were obtained in the ED and patient was given Levaquin and Unasyn. He'll be admitted due to aspiration pneumonia, dysphagia, hyponatremia and chronic pain.  He wishes to be full code.   PAST MEDICAL HISTORY:   Past Medical History  Diagnosis Date  . Hypertension   . Pneumonia     2004  . GERD (gastroesophageal reflux disease)   . Arthritis   . Anemia     blood transfusion 2012  . Bleeding ulcer   . Machinery accident     LEG INJURY  . Cancer ([place])     mouth 2006 then again 2016  . Protein-calorie malnutrition, severe ([place]) 07/05/2015  . Chronic pain syndrome 08/08/2015  . PUD (peptic ulcer disease)  08/08/2015    PAST SURGICAL HISTORY:  Past Surgical History  Procedure Laterality Date  . Leg surgery    . Joint replacement      right hip  . Tongue biopsy    . Tongue surgery      multiple surgeries  . Direct laryngoscopy N/A 06/26/2015    Procedure: Laryngoscopy with tongue base biopsy ;  Surgeon: Beverly Gust, MD;  Location: [facility] ORS;  Service: ENT;  Laterality: N/A;  . Esophagogastroduodenoscopy N/A 07/06/2015    Procedure: ESOPHAGOGASTRODUODENOSCOPY (EGD) and percutaneous gastrostomy tyube placement;  Surgeon: Lollie Sails, MD;  Location: [facility] ENDOSCOPY;  Service: Endoscopy;  Laterality: N/A;  Residual need to be done in the afternoon on 07/06/2015. Combined procedure with Dr. Gustavo Lah and Dr Rayann Heman  . Peripheral vascular catheterization N/A 07/27/2015    Procedure: Glori Luis Cath Insertion;  Surgeon: Katha Cabal, MD;  Location: [facility] CV LAB;  Service: Cardiovascular;  Laterality: N/A;  . Esophagogastroduodenoscopy (egd) with propofol N/A 08/07/2015    Procedure: ESOPHAGOGASTRODUODENOSCOPY (EGD) WITH PROPOFOL;  Surgeon: Lollie Sails, MD;  Location: [facility] ENDOSCOPY;  Service: Endoscopy;  Laterality: N/A;  . Peg placement N/A 10/08/2015    Procedure: PERCUTANEOUS ENDOSCOPIC GASTROSTOMY (PEG) PLACEMENT;  Surgeon: Manya Silvas, MD;  Location: [facility] ENDOSCOPY;  Service: Endoscopy;  Laterality: N/A;    SOCIAL HISTORY:  Social History  Substance Use Topics  . Smoking status: Former Smoker -- 1.00  packs/day for 0 years    Types: Cigarettes    Quit date: 09/21/2014  . Smokeless tobacco: Not on file  . Alcohol Use: 7.2 oz/week    12 Cans of beer per week     Comment: beer/wine every day    FAMILY HISTORY:  Family History  Problem Relation Age of Onset  . CAD Mother   . CAD Father     DRUG ALLERGIES: No Known Allergies  REVIEW OF SYSTEMS:   CONSTITUTIONAL: + fever, fatigue and weakess. EYES: No blurred or double vision.  EARS, NOSE, AND THROAT: No  tinnitus or ear pain.  RESPIRATORY: As in history of present illness CARDIOVASCULAR: As in history of present illness GASTROINTESTINAL: No nausea, vomiting, diarrhea or abdominal pain.  GENITOURINARY: No dysuria, hematuria.  ENDOCRINE: No polyuria, nocturia,  HEMATOLOGY: No anemia, easy bruising or bleeding SKIN: No rash or lesion. MUSCULOSKELETAL: No joint pain or arthritis.   NEUROLOGIC: No tingling, numbness, weakness.  PSYCHIATRY: No anxiety or depression.   MEDICATIONS AT HOME:  Prior to Admission medications   Medication Sig Start Date End Date Taking? Authorizing Provider  citalopram (CELEXA) 20 MG tablet Take 1 tablet (20 mg total) by mouth daily. 07/26/15  Yes Forest Gleason, MD  lisinopril (PRINIVIL,ZESTRIL) 10 MG tablet Take 10 mg by mouth at bedtime.    Yes Historical Provider, MD  Nutritional Supplements (FEEDING SUPPLEMENT, OSMOLITE 1.5 CAL,) LIQD Place 240 mLs into feeding tube 5 (five) times daily. 10/10/15  Yes Fritzi Mandes, MD  omeprazole (PRILOSEC) 20 MG capsule Take 1 capsule (20 mg total) by mouth 2 (two) times daily before a meal. 11/15/15  Yes Forest Gleason, MD  ondansetron (ZOFRAN) 4 MG tablet Take 1 tablet (4 mg total) by mouth every 4 (four) hours as needed for nausea or vomiting. 08/08/15  Yes Hillary Bow, MD  oxycodone (ROXICODONE) 30 MG immediate release tablet Take one tablet by mouth up to 7 (seven) times daily as needed for pain 02/08/16  Yes Evlyn Kanner, NP  pantoprazole (PROTONIX) 40 MG tablet TAKE 1 TABLET BY MOUTH TWICE DAILY 01/17/16  Yes Forest Gleason, MD  phenol (CHLORASEPTIC) 1.4 % LIQD Use as directed 1 spray in the mouth or throat as needed for throat irritation / pain. 07/07/15  Yes Fritzi Mandes, MD  polyvinyl alcohol (LIQUIFILM TEARS) 1.4 % ophthalmic solution Place 1 drop into both eyes as needed for dry eyes. 10/10/15  Yes Fritzi Mandes, MD  pravastatin (PRAVACHOL) 40 MG tablet Take 40 mg by mouth at bedtime.    Yes Historical Provider, MD  traZODone  (DESYREL) 100 MG tablet Take 100 mg by mouth at bedtime.   Yes Historical Provider, MD      PHYSICAL EXAMINATION:   VITAL SIGNS: Blood pressure 155/94, pulse 80, temperature 97.6 F (36.4 C), temperature source Oral, resp. rate 20, height '6\' 2"'$  (1.88 m), weight 51.71 kg (114 lb), SpO2 96 %.  GENERAL: Cachectic 60 y.o.-year-old patient lying in the bed with no acute distress. Alert and oriented x 3. EYES: Pupils equal, round, reactive to light and accommodation. No scleral icterus. Extraocular muscles intact.  HEENT: Head atraumatic, normocephalic. Oropharynx and nasopharynx clear.  NECK:  Supple, no jugular venous distention. No thyroid enlargement, no tenderness.  LUNGS: Decreased entry bilaterally with scattered wheezes and minimal rales in the posterior lower lung zones.  CARDIOVASCULAR: S1, S2 normal. No murmurs, rubs, or gallops.  ABDOMEN: PEG tube present, soft, nontender, nondistended. Bowel sounds present. No organomegaly or mass.  EXTREMITIES: No  pedal edema, cyanosis, or clubbing.  NEUROLOGIC: Cranial nerves II through XII are intact. Muscle strength 5/5 in all extremities. Sensation intact. Gait not checked.   SKIN: No obvious rash, lesion, or ulcer.   LABORATORY PANEL:   CBC  Recent Labs Lab 02/05/16 1504 02/07/16 2014  WBC 8.9 11.6*  HGB 9.9* 9.2*  HCT 30.3* 28.4*  PLT 387 347  MCV 78.8* 79.1*  MCH 25.8* 25.6*  MCHC 32.8 32.3  RDW 17.5* 17.5*  LYMPHSABS 3.7* 3.3  MONOABS 1.1* 1.0  EOSABS 0.1 0.1  BASOSABS 0.0 0.0   ------------------------------------------------------------------------------------------------------------------  Chemistries   Recent Labs Lab 02/05/16 1504 02/07/16 2014  NA 133* 132*  K 4.3 4.1  CL 97* 100*  CO2 31 26  GLUCOSE 87 107*  BUN 19 18  CREATININE 0.65 0.43*  CALCIUM 8.6* 8.5*  AST 18  --   ALT 10*  --   ALKPHOS 69  --   BILITOT 0.2*  --     ------------------------------------------------------------------------------------------------------------------ estimated creatinine clearance is 72.7 mL/min (by C-G formula based on Cr of 0.43). ------------------------------------------------------------------------------------------------------------------ No results for input(s): TSH, T4TOTAL, T3FREE, THYROIDAB in the last 72 hours.  Invalid input(s): FREET3   Coagulation profile No results for input(s): INR, PROTIME in the last 168 hours. ------------------------------------------------------------------------------------------------------------------- No results for input(s): DDIMER in the last 72 hours. -------------------------------------------------------------------------------------------------------------------  Cardiac Enzymes No results for input(s): CKMB, TROPONINI, MYOGLOBIN in the last 168 hours.  Invalid input(s): CK ------------------------------------------------------------------------------------------------------------------ Invalid input(s): POCBNP  ---------------------------------------------------------------------------------------------------------------  Urinalysis    Component Value Date/Time   COLORURINE YELLOW* 10/02/2015 1630   COLORURINE Yellow 02/20/2014 1732   APPEARANCEUR CLEAR* 10/02/2015 1630   APPEARANCEUR Clear 02/20/2014 1732   LABSPEC 1.056* 10/02/2015 1630   LABSPEC 1.015 02/20/2014 1732   PHURINE 5.0 10/02/2015 1630   PHURINE 6.0 02/20/2014 1732   GLUCOSEU NEGATIVE 10/02/2015 1630   GLUCOSEU Negative 02/20/2014 1732   HGBUR NEGATIVE 10/02/2015 1630   HGBUR Negative 02/20/2014 1732   BILIRUBINUR NEGATIVE 10/02/2015 1630   BILIRUBINUR Negative 02/20/2014 1732   KETONESUR NEGATIVE 10/02/2015 1630   KETONESUR 2+ 02/20/2014 1732   PROTEINUR 30* 10/02/2015 1630   PROTEINUR Negative 02/20/2014 1732   UROBILINOGEN 0.2 12/10/2007 1120   NITRITE NEGATIVE 10/02/2015 1630    NITRITE Negative 02/20/2014 1732   LEUKOCYTESUR NEGATIVE 10/02/2015 1630   LEUKOCYTESUR Negative 02/20/2014 1732     RADIOLOGY: Dg Chest 2 View  02/07/2016  CLINICAL DATA:  60 year old male with shortness of breath and cough productive of green sputum. EXAM: CHEST  2 VIEW COMPARISON:  Chest x-ray 01/01/2016. FINDINGS: There continues to be airspace consolidation in the medial aspect of the left lower lobe and in the right lower lobe, which appears very similar to the prior study from 01/01/2016. Trace bilateral pleural effusions are noted. There is some architectural distortion in the region of the right middle lobe, which likely reflects post infectious or inflammatory scarring. No evidence of pulmonary edema. No pneumothorax. No definite suspicious appearing pulmonary nodules or masses. Heart size is normal. Upper mediastinal contours are within normal limits. Right internal jugular single-lumen porta cath with tip terminating in the right atrium. IMPRESSION: 1. Persistent bilateral lower lobe pneumonia with trace bilateral pleural effusions. Electronically Signed   By: Vinnie Langton M.D.   On: 02/07/2016 17:59    EKG: Orders placed or performed during the hospital encounter of 10/02/15  . ED EKG  . ED EKG  . EKG 12-Lead  . EKG 12-Lead    ASSESSMENT  Principal Problem:  Aspiration pneumonia (HCC) Active Problems:   Protein-calorie malnutrition, severe (HCC)   Dysphagia   Oral cancer (HCC)   Chronic pain   Hyponatremia  PLAN   1). Likely aspiration pneumonia - Patient has dysphagia and has been choking on oral fluids recently. He reports fever, chills and radiography shows bilateral lower lobe pneumonia. - Blood cultures have been obtained and patient started on vancomycin and Levaquin. We'll follow up with blood culture results and adjust antibiotics if necessary. - Continue DuoNeb's, supplementary oxygen, guaifenesin and incentive spirometry. - Maintain aspiration  precautions.  2). Dysphagia with severe protein calorie malnutrition - Patient has chronic dysphagia and states that it has worsened recently. This is likely due to oral cancer status post chemotherapy and radiation. - Continue PEG tube feedings and consult nutrition service to optimize. - Patient is nothing by mouth. Continue aspiration precautions  3). Oral cancer - Completed radiation and chemotherapy. Follow up with oncology for scheduled PET scan and follow-up management  4). Chronic pain - Continue by mouth morphine and when necessary IV morphine.  5). Hyponatremia - Patient has a sodium of 132. Continue IV hydration and monitor sodium levels.  All the records are reviewed and case discussed with ED provider. Management plans discussed with the patient, family and they are in agreement.  CODE STATUS: Code Status History    Date Active Date Inactive Code Status Order ID Comments User Context   10/02/2015  5:34 PM 10/10/2015  9:36 PM Full Code [id number]  Fritzi Mandes, MD Inpatient   08/03/2015  2:18 PM 08/09/2015  4:16 PM Full Code [id number]  Loletha Grayer, MD ED   07/03/2015  3:13 PM 07/07/2015  1:42 PM Full Code [id number]  Theodoro Grist, MD Inpatient      I have independently reviewed all EKG and chest x-ray data  VTE prophylaxis: Lovenox if no contraindications and patient at low risk for bleeding. SCD's and progressive ambulation if patient has contraindications to anticoagulants. No DVT prophylaxis if patient presently receiving therapeutic anticoagulation or is at significant risk of bleeding for which the risk of anticoagulation outweigh the potential benefits.  Vaccinations: Pneumonia & flu vaccine per hospital protocol Prevention: Will proceed with conservative measures for the prevention of delirium in patients older than 65. Fall precautions and 1:1 sitter as needed per hospital protocol.  TOTAL TIME TAKING CARE OF THIS PATIENT: 50 minutes.    Sylvan Cheese M.D on  02/08/2016 at 12:11 AM  Between 7am to 6pm - Pager - [phone]  After 6pm go to [email or web address] - password [email or web address] [facility]  Office  [phone]  CC: Primary care physician; Cyndi Bender, [name]

## 2016-02-09 MED ORDER — AMOXICILLIN-POT CLAVULANATE 400-57 MG/5ML PO SUSR: 800.0000 mg | Freq: Two times a day (BID) | ORAL | Status: AC

## 2016-02-09 MED ORDER — AMOXICILLIN-POT CLAVULANATE 400-57 MG/5ML PO SUSR
800.0000 mg | Freq: Two times a day (BID) | ORAL | Status: DC
  Administered 2016-02-09: 800 mg via ORAL
  Filled 2016-02-09 (×2): qty 10

## 2016-02-09 MED ORDER — HEPARIN SOD (PORK) LOCK FLUSH 100 UNIT/ML IV SOLN
500.0000 [IU] | Freq: Once | INTRAVENOUS | Status: DC
  Filled 2016-02-09: qty 5

## 2016-02-09 MED ORDER — GUAIFENESIN 100 MG/5ML PO SOLN: 5.0000 mL | ORAL | Status: DC | PRN

## 2016-02-09 NOTE — Discharge Instructions (Signed)

## 2016-02-09 NOTE — Care Management Note (Signed)
Case Management Note  Patient Details  Name: ARTEMIS LOYAL MRN: [id number] Date of Birth: 10-May-1956  Subjective/Objective:     Discussed discharge planning with wife [name] She wants to use [facility] as Mr Hollenbeck's home health provider. A referral was faxed and called to [facility] requesting RN, PT, Aid, and Speech therapies.                Action/Plan:   Expected Discharge Date:                  Expected Discharge Plan:     In-House Referral:     Discharge planning Services     Post Acute Care Choice:    Choice offered to:     DME Arranged:    DME Agency:     HH Arranged:    [place] Agency:     Status of Service:     Medicare Important Message Given:  Yes Date Medicare IM Given:    Medicare IM give by:    Date Additional Medicare IM Given:    Additional Medicare Important Message give by:     If discussed at [place] of Stay Meetings, dates discussed:    Additional Comments:  Jonique Kulig A, RN 02/09/2016, 8:45 AM

## 2016-02-09 NOTE — Consult Note (Signed)
Pharmacy Antibiotic Note  Grant Lee is a 60 y.o. male admitted on 02/07/2016 with aspiration pneumonia/pneumonitis.  Pharmacy has been consulted for liquid augmentin dosing.  Plan: Will give Augmentin liquid 800/'114mg'$  PO BID based on patient's renal function.  Height: '6\' 2"'$  (188 cm) Weight: 122 lb (55.339 kg) IBW/kg (Calculated) : 82.2  Temp (24hrs), Avg:98.6 F (37 C), Min:98.2 F (36.8 C), Max:98.9 F (37.2 C)   Recent Labs Lab 02/05/16 1504 02/07/16 2014 02/08/16 0245  WBC 8.9 11.6* 8.7  CREATININE 0.65 0.43* 0.50*    Estimated Creatinine Clearance: 77.8 mL/min (by C-G formula based on Cr of 0.5).    No Known Allergies  Antimicrobials this admission: Augmentin 4/1>> Unsayn 3/30 >> 4/1 Vancomycin 3/31 >> 3/31 Levofloxacin x1 in ED 3/30   Thank you for allowing pharmacy to be a part of this patient's care.  Vena Rua 02/09/2016 8:03 AM

## 2016-02-09 NOTE — Discharge Summary (Signed)
.[facility] NAME: Grant Lee    MR#:  [id number]  DATE OF BIRTH:  1956-05-21  DATE OF ADMISSION:  02/07/2016 ADMITTING PHYSICIAN: Saundra Shelling, MD  DATE OF DISCHARGE: 02/09/2016  PRIMARY CARE PHYSICIAN: Cyndi Bender, PA-C    ADMISSION DIAGNOSIS:  Aspiration pneumonia, unspecified aspiration pneumonia type, unspecified laterality, unspecified part of lung ([place]) [J69.0]  DISCHARGE DIAGNOSIS:  Principal Problem:   Aspiration pneumonia ([place]) Active Problems:   Protein-calorie malnutrition, severe ([place])   Dysphagia   Oral cancer ([place])   Chronic pain   Hyponatremia   SECONDARY DIAGNOSIS:   Past Medical History  Diagnosis Date  . Hypertension   . Pneumonia     2004  . GERD (gastroesophageal reflux disease)   . Arthritis   . Anemia     blood transfusion 2012  . Bleeding ulcer   . Machinery accident     LEG INJURY  . Cancer ([place])     mouth 2006 then again 2016  . Protein-calorie malnutrition, severe ([place]) 07/05/2015  . Chronic pain syndrome 08/08/2015  . PUD (peptic ulcer disease) 08/08/2015    HOSPITAL COURSE:    60 year old male with a history of oral cancer and PEG tube Who presented with aspiration pneumonia. For further details please refer the H&P  1. Aspiration pneumonitis: He was treated initially with VANC and levaquin. He did not have a significant oxygen requirement and therefore was downgraded to Unasyn. His blood cultures are negative. He can be doscharged on oral AUGMENTIN. He needs strict aspiration precautions.  2. Dysphagia: As per speech recommendations: As pt is having difficulty swallowing and protecting his airway against pleasure sips of water and/or ice chips, and this is resulting in a declined respiratory status w/ admission to the hospital(dx of Aspiration Pneumonia),rec. Discontinue any pleasure po's and remain NPO w/ TFs for nutrition/hydration; frequent oral care for hygiene  and comfort 3. Protein calorie malnutrition severe: Continue tube feeds 4. Oral cancer: He will follow up with Dr Jeb Levering.    DISCHARGE CONDITIONS AND DIET:  Stable for discharge with HH PEG feedings  CONSULTS OBTAINED:     DRUG ALLERGIES:  No Known Allergies  DISCHARGE MEDICATIONS:   Current Discharge Medication List    START taking these medications   Details  amoxicillin-clavulanate (AUGMENTIN) 400-57 MG/5ML suspension Take 10 mLs (800 mg total) by mouth every 12 (twelve) hours. Qty: 100 mL, Refills: 0    guaiFENesin (ROBITUSSIN) 100 MG/5ML SOLN Take 5 mLs (100 mg total) by mouth every 4 (four) hours as needed for cough or to loosen phlegm. Qty: 1200 mL, Refills: 0      CONTINUE these medications which have NOT CHANGED   Details  citalopram (CELEXA) 20 MG tablet Take 1 tablet (20 mg total) by mouth daily. Qty: 30 tablet, Refills: 3   Associated Diagnoses: Tongue cancer (HCC)    lisinopril (PRINIVIL,ZESTRIL) 10 MG tablet Take 10 mg by mouth at bedtime.     Nutritional Supplements (FEEDING SUPPLEMENT, OSMOLITE 1.5 CAL,) LIQD Place 240 mLs into feeding tube 5 (five) times daily. Qty: 150 Bottle, Refills: 3    omeprazole (PRILOSEC) 20 MG capsule Take 1 capsule (20 mg total) by mouth 2 (two) times daily before a meal. Qty: 60 capsule, Refills: 3   Associated Diagnoses: Cancer of tongue (HCC)    ondansetron (ZOFRAN) 4 MG tablet Take 1 tablet (4 mg total) by mouth every 4 (four) hours as needed for nausea or vomiting. Qty: 45  tablet, Refills: 0    oxycodone (ROXICODONE) 30 MG immediate release tablet Take one tablet by mouth up to 7 (seven) times daily as needed for pain Qty: 210 tablet, Refills: 0   Associated Diagnoses: Cancer of tongue (HCC)    phenol (CHLORASEPTIC) 1.4 % LIQD Use as directed 1 spray in the mouth or throat as needed for throat irritation / pain. Qty: 177 mL, Refills: 0    polyvinyl alcohol (LIQUIFILM TEARS) 1.4 % ophthalmic solution Place 1 drop  into both eyes as needed for dry eyes. Qty: 15 mL, Refills: 0    pravastatin (PRAVACHOL) 40 MG tablet Take 40 mg by mouth at bedtime.     traZODone (DESYREL) 100 MG tablet Take 100 mg by mouth at bedtime.      STOP taking these medications     pantoprazole (PROTONIX) 40 MG tablet               Today   CHIEF COMPLAINT:  Doing ok this am no acute events overnight Understands that he should ot take anything by mouth   VITAL SIGNS:  Blood pressure 126/87, pulse 88, temperature 98.1 F (36.7 C), temperature source Oral, resp. rate 20, height '6\' 2"'$  (1.88 m), weight 55.339 kg (122 lb), SpO2 97 %.   REVIEW OF SYSTEMS:  Review of Systems  Constitutional: Negative for fever, chills and malaise/fatigue.  HENT: Negative for ear discharge, ear pain, hearing loss, nosebleeds and sore throat.   Eyes: Negative for blurred vision and pain.  Respiratory: Negative for cough, hemoptysis, shortness of breath and wheezing.   Cardiovascular: Negative for chest pain, palpitations and leg swelling.  Gastrointestinal: Negative for nausea, vomiting, abdominal pain, diarrhea and blood in stool.  Genitourinary: Negative for dysuria.  Musculoskeletal: Negative for back pain.  Neurological: Negative for dizziness, tremors, speech change, focal weakness, seizures and headaches.  Endo/Heme/Allergies: Does not bruise/bleed easily.  Psychiatric/Behavioral: Negative for depression, suicidal ideas and hallucinations.     PHYSICAL EXAMINATION:  GENERAL:  60 y.o.-year-old patient lying in the bed with no acute distress. Very frail appearing  NECK:  Supple, no jugular venous distention. No thyroid enlargement, no tenderness.  LUNGS: Normal breath sounds bilaterally, no wheezing, rales,rhonchi  No use of accessory muscles of respiration.  CARDIOVASCULAR: S1, S2 normal. No murmurs, rubs, or gallops.  ABDOMEN: Soft, non-tender, non-distended. Bowel sounds present. No organomegaly or mass. PEG tube clean  with out discharge around site. EXTREMITIES: No pedal edema, cyanosis, or clubbing.  PSYCHIATRIC: The patient is alert and oriented x 3.  SKIN: No obvious rash, lesion, or ulcer.   DATA REVIEW:   CBC  Recent Labs Lab 02/08/16 0245  WBC 8.7  HGB 9.4*  HCT 29.0*  PLT 294    Chemistries   Recent Labs Lab 02/05/16 1504 02/07/16 2014 02/08/16 0245  NA 133* 132*  --   K 4.3 4.1  --   CL 97* 100*  --   CO2 31 26  --   GLUCOSE 87 107*  --   BUN 19 18  --   CREATININE 0.65 0.43* 0.50*  CALCIUM 8.6* 8.5*  --   AST 18  --   --   ALT 10*  --   --   ALKPHOS 69  --   --   BILITOT 0.2*  --   --     Cardiac Enzymes No results for input(s): TROPONINI in the last 168 hours.  Microbiology Results  '@[id number]'$ @  RADIOLOGY:  Dg Chest 2 View  02/07/2016  CLINICAL DATA:  60 year old male with shortness of breath and cough productive of green sputum. EXAM: CHEST  2 VIEW COMPARISON:  Chest x-ray 01/01/2016. FINDINGS: There continues to be airspace consolidation in the medial aspect of the left lower lobe and in the right lower lobe, which appears very similar to the prior study from 01/01/2016. Trace bilateral pleural effusions are noted. There is some architectural distortion in the region of the right middle lobe, which likely reflects post infectious or inflammatory scarring. No evidence of pulmonary edema. No pneumothorax. No definite suspicious appearing pulmonary nodules or masses. Heart size is normal. Upper mediastinal contours are within normal limits. Right internal jugular single-lumen porta cath with tip terminating in the right atrium. IMPRESSION: 1. Persistent bilateral lower lobe pneumonia with trace bilateral pleural effusions. Electronically Signed   By: Vinnie Langton M.D.   On: 02/07/2016 17:59      Management plans discussed with the patient and he is in agreement. Stable for discharge home  Patient should follow up with Dr Jeb Levering in 1 week  CODE STATUS:      Code Status Orders        Start     Ordered   02/08/16 0212  Full code   Continuous     02/08/16 0211    Code Status History    Date Active Date Inactive Code Status Order ID Comments User Context   10/02/2015  5:34 PM 10/10/2015  9:36 PM Full Code [id number]  Fritzi Mandes, MD Inpatient   08/03/2015  2:18 PM 08/09/2015  4:16 PM Full Code [id number]  Loletha Grayer, MD ED   07/03/2015  3:13 PM 07/07/2015  1:42 PM Full Code [id number]  Theodoro Grist, MD Inpatient      TOTAL TIME TAKING CARE OF THIS PATIENT: 34 minutes.    Note: This dictation was prepared with Dragon dictation along with smaller phrase technology. Any transcriptional errors that result from this process are unintentional.  Emer Onnen M.D on 02/09/2016 at 11:09 AM  Between 7am to 6pm - Pager - [phone] After 6pm go to [email or web address] - password [email or web address] [facility]  Office  [phone]  CC: Primary care physician; Cyndi Bender, [name]

## 2016-02-09 NOTE — Progress Notes (Signed)
Received MD order to discharge patient to home with home health, reviewed discharge instructions prescriptions and home meds with patient and patient verbalized understanding discharged with nursing in wheelchair with wife to home

## 2016-02-12 LAB — CULTURE, BLOOD (ROUTINE X 2)
CULTURE: NO GROWTH
Culture: NO GROWTH

## 2016-02-13 DIAGNOSIS — Z431 Encounter for attention to gastrostomy: Secondary | ICD-10-CM | POA: Diagnosis not present

## 2016-02-13 DIAGNOSIS — K219 Gastro-esophageal reflux disease without esophagitis: Secondary | ICD-10-CM | POA: Diagnosis not present

## 2016-02-13 DIAGNOSIS — I10 Essential (primary) hypertension: Secondary | ICD-10-CM | POA: Diagnosis not present

## 2016-02-13 DIAGNOSIS — C069 Malignant neoplasm of mouth, unspecified: Secondary | ICD-10-CM | POA: Diagnosis not present

## 2016-02-13 DIAGNOSIS — M1990 Unspecified osteoarthritis, unspecified site: Secondary | ICD-10-CM | POA: Diagnosis not present

## 2016-02-13 DIAGNOSIS — Z9981 Dependence on supplemental oxygen: Secondary | ICD-10-CM | POA: Diagnosis not present

## 2016-02-13 DIAGNOSIS — Z85818 Personal history of malignant neoplasm of other sites of lip, oral cavity, and pharynx: Secondary | ICD-10-CM | POA: Diagnosis not present

## 2016-02-13 DIAGNOSIS — D649 Anemia, unspecified: Secondary | ICD-10-CM | POA: Diagnosis not present

## 2016-02-13 DIAGNOSIS — R1310 Dysphagia, unspecified: Secondary | ICD-10-CM | POA: Diagnosis not present

## 2016-02-13 DIAGNOSIS — K259 Gastric ulcer, unspecified as acute or chronic, without hemorrhage or perforation: Secondary | ICD-10-CM | POA: Diagnosis not present

## 2016-02-13 DIAGNOSIS — J690 Pneumonitis due to inhalation of food and vomit: Secondary | ICD-10-CM | POA: Diagnosis not present

## 2016-02-13 DIAGNOSIS — E43 Unspecified severe protein-calorie malnutrition: Secondary | ICD-10-CM | POA: Diagnosis not present

## 2016-02-13 DIAGNOSIS — G894 Chronic pain syndrome: Secondary | ICD-10-CM | POA: Diagnosis not present

## 2016-02-14 DIAGNOSIS — J690 Pneumonitis due to inhalation of food and vomit: Secondary | ICD-10-CM | POA: Diagnosis not present

## 2016-02-14 DIAGNOSIS — C029 Malignant neoplasm of tongue, unspecified: Secondary | ICD-10-CM | POA: Diagnosis not present

## 2016-02-14 DIAGNOSIS — R1310 Dysphagia, unspecified: Secondary | ICD-10-CM | POA: Diagnosis not present

## 2016-02-14 DIAGNOSIS — G4700 Insomnia, unspecified: Secondary | ICD-10-CM | POA: Diagnosis not present

## 2016-02-14 DIAGNOSIS — Z931 Gastrostomy status: Secondary | ICD-10-CM | POA: Diagnosis not present

## 2016-02-15 ENCOUNTER — Ambulatory Visit: Payer: Medicare Other | Admitting: Internal Medicine

## 2016-02-15 ENCOUNTER — Ambulatory Visit
Admission: RE | Admit: 2016-02-15 | Discharge: 2016-02-15 | Disposition: A | Discharge status: Home / Self Care | Payer: Medicare Other | Source: Ambulatory Visit | Attending: Family Medicine | Admitting: Family Medicine

## 2016-02-15 DIAGNOSIS — C029 Malignant neoplasm of tongue, unspecified: Secondary | ICD-10-CM | POA: Diagnosis not present

## 2016-02-15 DIAGNOSIS — Z96641 Presence of right artificial hip joint: Secondary | ICD-10-CM | POA: Insufficient documentation

## 2016-02-15 DIAGNOSIS — R918 Other nonspecific abnormal finding of lung field: Secondary | ICD-10-CM | POA: Diagnosis not present

## 2016-02-15 DIAGNOSIS — M87852 Other osteonecrosis, left femur: Secondary | ICD-10-CM | POA: Diagnosis not present

## 2016-02-15 DIAGNOSIS — C760 Malignant neoplasm of head, face and neck: Secondary | ICD-10-CM | POA: Diagnosis not present

## 2016-02-15 LAB — GLUCOSE, CAPILLARY: Glucose-Capillary: 54 mg/dL — ABNORMAL LOW (ref 65–99)

## 2016-02-15 MED ORDER — FLUDEOXYGLUCOSE F - 18 (FDG) INJECTION
12.8500 | Freq: Once | INTRAVENOUS | Status: AC | PRN
  Administered 2016-02-15: 12.85 via INTRAVENOUS

## 2016-02-18 ENCOUNTER — Inpatient Hospital Stay: Payer: Medicare Other | Attending: Internal Medicine | Admitting: Internal Medicine

## 2016-02-18 ENCOUNTER — Inpatient Hospital Stay: Payer: Medicare Other

## 2016-02-18 VITALS — BP 104/63 | HR 94 | Temp 97.2°F | Resp 18 | Wt 118.2 lb

## 2016-02-18 DIAGNOSIS — I959 Hypotension, unspecified: Secondary | ICD-10-CM | POA: Diagnosis not present

## 2016-02-18 DIAGNOSIS — E43 Unspecified severe protein-calorie malnutrition: Secondary | ICD-10-CM

## 2016-02-18 DIAGNOSIS — Z862 Personal history of diseases of the blood and blood-forming organs and certain disorders involving the immune mechanism: Secondary | ICD-10-CM

## 2016-02-18 DIAGNOSIS — Z931 Gastrostomy status: Secondary | ICD-10-CM

## 2016-02-18 DIAGNOSIS — M129 Arthropathy, unspecified: Secondary | ICD-10-CM | POA: Diagnosis not present

## 2016-02-18 DIAGNOSIS — C01 Malignant neoplasm of base of tongue: Secondary | ICD-10-CM

## 2016-02-18 DIAGNOSIS — Z9221 Personal history of antineoplastic chemotherapy: Secondary | ICD-10-CM

## 2016-02-18 DIAGNOSIS — C029 Malignant neoplasm of tongue, unspecified: Secondary | ICD-10-CM

## 2016-02-18 DIAGNOSIS — J449 Chronic obstructive pulmonary disease, unspecified: Secondary | ICD-10-CM

## 2016-02-18 DIAGNOSIS — G894 Chronic pain syndrome: Secondary | ICD-10-CM

## 2016-02-18 DIAGNOSIS — R1310 Dysphagia, unspecified: Secondary | ICD-10-CM | POA: Diagnosis not present

## 2016-02-18 DIAGNOSIS — D649 Anemia, unspecified: Secondary | ICD-10-CM | POA: Diagnosis not present

## 2016-02-18 DIAGNOSIS — Z8701 Personal history of pneumonia (recurrent): Secondary | ICD-10-CM | POA: Diagnosis not present

## 2016-02-18 DIAGNOSIS — Z923 Personal history of irradiation: Secondary | ICD-10-CM | POA: Diagnosis not present

## 2016-02-18 DIAGNOSIS — Z8711 Personal history of peptic ulcer disease: Secondary | ICD-10-CM

## 2016-02-18 DIAGNOSIS — M542 Cervicalgia: Secondary | ICD-10-CM

## 2016-02-18 DIAGNOSIS — I10 Essential (primary) hypertension: Secondary | ICD-10-CM | POA: Diagnosis not present

## 2016-02-18 DIAGNOSIS — R630 Anorexia: Secondary | ICD-10-CM

## 2016-02-18 DIAGNOSIS — R05 Cough: Secondary | ICD-10-CM | POA: Diagnosis not present

## 2016-02-18 DIAGNOSIS — R0602 Shortness of breath: Secondary | ICD-10-CM | POA: Diagnosis not present

## 2016-02-18 DIAGNOSIS — K219 Gastro-esophageal reflux disease without esophagitis: Secondary | ICD-10-CM | POA: Diagnosis not present

## 2016-02-18 DIAGNOSIS — Z79899 Other long term (current) drug therapy: Secondary | ICD-10-CM

## 2016-02-18 DIAGNOSIS — R634 Abnormal weight loss: Secondary | ICD-10-CM | POA: Insufficient documentation

## 2016-02-18 DIAGNOSIS — Z87891 Personal history of nicotine dependence: Secondary | ICD-10-CM

## 2016-02-18 LAB — COMPREHENSIVE METABOLIC PANEL
ALT: 11 U/L — AB (ref 17–63)
AST: 21 U/L (ref 15–41)
Albumin: 3.1 g/dL — ABNORMAL LOW (ref 3.5–5.0)
Alkaline Phosphatase: 69 U/L (ref 38–126)
Anion gap: 4 — ABNORMAL LOW (ref 5–15)
BUN: 16 mg/dL (ref 6–20)
CALCIUM: 8.7 mg/dL — AB (ref 8.9–10.3)
CHLORIDE: 97 mmol/L — AB (ref 101–111)
CO2: 29 mmol/L (ref 22–32)
CREATININE: 0.59 mg/dL — AB (ref 0.61–1.24)
Glucose, Bld: 146 mg/dL — ABNORMAL HIGH (ref 65–99)
Potassium: 4.3 mmol/L (ref 3.5–5.1)
Sodium: 130 mmol/L — ABNORMAL LOW (ref 135–145)
TOTAL PROTEIN: 7.1 g/dL (ref 6.5–8.1)

## 2016-02-18 LAB — CBC WITH DIFFERENTIAL/PLATELET
Basophils Absolute: 0 10*3/uL (ref 0–0.1)
Basophils Relative: 0 %
EOS PCT: 1 %
Eosinophils Absolute: 0.1 10*3/uL (ref 0–0.7)
HEMATOCRIT: 32.4 % — AB (ref 40.0–52.0)
Hemoglobin: 10.5 g/dL — ABNORMAL LOW (ref 13.0–18.0)
LYMPHS ABS: 2.5 10*3/uL (ref 1.0–3.6)
LYMPHS PCT: 29 %
MCH: 25.8 pg — AB (ref 26.0–34.0)
MCHC: 32.5 g/dL (ref 32.0–36.0)
MCV: 79.3 fL — AB (ref 80.0–100.0)
MONO ABS: 0.7 10*3/uL (ref 0.2–1.0)
Monocytes Relative: 8 %
NEUTROS ABS: 5.4 10*3/uL (ref 1.4–6.5)
Neutrophils Relative %: 62 %
PLATELETS: 378 10*3/uL (ref 150–440)
RBC: 4.08 MIL/uL — AB (ref 4.40–5.90)
RDW: 17.2 % — ABNORMAL HIGH (ref 11.5–14.5)
WBC: 8.7 10*3/uL (ref 3.8–10.6)

## 2016-02-18 NOTE — Progress Notes (Signed)
Patient states his mouth and throat are sore.

## 2016-02-18 NOTE — Progress Notes (Signed)
[facility] OFFICE PROGRESS NOTE  Patient Care Team: Cyndi Bender, PA-C as PCP - General (Physician Assistant) Beverly Gust, MD (Unknown Physician Specialty)   SUMMARY OF ONCOLOGIC HISTORY:  #  . Cancer of tongue   1. Has a history of cancer of the tongue in 2006. Patient underwent resection followed by radiation therapy 2. Increasing difficulty in swallowing for last 2 or 3 weeks. Patient had upper endoscopy done in December of 2015. 3. Significant weight loss 4. Abnormal PET scan (August of 2016) 5. 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 (Cis-platinum and radiation therapy) (September 2 016) T3 N0 M0 tumor Status post PEG tube placement (September, 2016) 6. Poor tolerance dose cis-platinum therapy, so patient has been switched over to cetuximab along with radiation therapy (October, 2016) 7. Patient has finished cetuximab as well as radiation therapy by 23rd of November 2 016 8. Patient was recently hospitalized with bilateral pneumonia, aspiration pneumonia and was transferred to rehabilitation (December, 2016)       # April 2017- ? Recurrence of Left base of Tongue cancer; Recm Bx-Dr.McQueen  INTERVAL HISTORY:  This is my first interaction with the patient since I joined the practice September 2016. I reviewed the patient's prior charts/pertinent labs/imaging in detail; findings are summarized above.   59 year old male patient with multiple medical problems/COPD/debility- goes around with a walker at home; with a prior history of T3 N0 left base of tongue/unresectable squamous cell carcinoma status post cetuximab radiation finished in November 2016 is here for follow-up/to review the results of his restaging PET scan.  Patient complains of chronic discomfort swallowing. He also complains of pain in his left side of the neck. He does not take any by mouth food/ continuous  food intake only through PEG tube.  Poor appetite. No nausea no vomiting. Chronic shortness of breath chronic cough. No fevers.  He walks with a cane/walker at home.   REVIEW OF SYSTEMS:  A complete 10 point review of system is done which is negative except mentioned above/history of present illness.   PAST MEDICAL HISTORY :  Past Medical History  Diagnosis Date  . Hypertension   . Pneumonia     2004  . GERD (gastroesophageal reflux disease)   . Arthritis   . Anemia     blood transfusion 2012  . Bleeding ulcer   . Machinery accident     LEG INJURY  . Cancer ([place])     mouth 2006 then again 2016  . Protein-calorie malnutrition, severe ([place]) 07/05/2015  . Chronic pain syndrome 08/08/2015  . PUD (peptic ulcer disease) 08/08/2015    PAST SURGICAL HISTORY :   Past Surgical History  Procedure Laterality Date  . Leg surgery    . Joint replacement      right hip  . Tongue biopsy    . Tongue surgery      multiple surgeries  . Direct laryngoscopy N/A 06/26/2015    Procedure: Laryngoscopy with tongue base biopsy ;  Surgeon: Beverly Gust, MD;  Location: [facility] ORS;  Service: ENT;  Laterality: N/A;  . Esophagogastroduodenoscopy N/A 07/06/2015    Procedure: ESOPHAGOGASTRODUODENOSCOPY (EGD) and percutaneous gastrostomy tyube placement;  Surgeon: Lollie Sails, MD;  Location: [facility] ENDOSCOPY;  Service: Endoscopy;  Laterality: N/A;  Residual need to be done in the afternoon on 07/06/2015. Combined procedure with Dr. Gustavo Lah and Dr Rayann Heman  . Peripheral vascular catheterization N/A 07/27/2015    Procedure: Glori Luis  Cath Insertion;  Surgeon: Katha Cabal, MD;  Location: [facility] CV LAB;  Service: Cardiovascular;  Laterality: N/A;  . Esophagogastroduodenoscopy (egd) with propofol N/A 08/07/2015    Procedure: ESOPHAGOGASTRODUODENOSCOPY (EGD) WITH PROPOFOL;  Surgeon: Lollie Sails, MD;  [facility] END[facility]COPY;  Service: Endoscopy;  Laterality: N/A;  . Peg placement N/A 10/08/2015     Procedure: PERCUTANEOUS ENDOSCOPIC GASTROSTOMY (PEG) PLACEMENT;  Surgeon: Manya Silvas, MD;  Location: [facility] ENDOSCOPY;  Service: Endoscopy;  Laterality: N/A;    FAMILY HISTORY :   Family History  Problem Relation Age of Onset  . CAD Mother   . CAD Father     SOCIAL HISTORY:   Social History  Substance Use Topics  . Smoking status: Former Smoker -- 1.00 packs/day for 0 years    Types: Cigarettes    Quit date: 09/21/2014  . Smokeless tobacco: Not on file  . Alcohol Use: 7.2 oz/week    12 Cans of beer per week     Comment: beer/wine every day    ALLERGIES:  has No Known Allergies.  MEDICATIONS:  Current Outpatient Prescriptions  Medication Sig Dispense Refill  . citalopram (CELEXA) 20 MG tablet Take 1 tablet (20 mg total) by mouth daily. 30 tablet 3  . guaiFENesin (ROBITUSSIN) 100 MG/5ML SOLN Take 5 mLs (100 mg total) by mouth every 4 (four) hours as needed for cough or to loosen phlegm. 1200 mL 0  . lisinopril (PRINIVIL,ZESTRIL) 10 MG tablet Take 10 mg by mouth at bedtime.     . Nutritional Supplements (FEEDING SUPPLEMENT, OSMOLITE 1.5 CAL,) LIQD Place 240 mLs into feeding tube 5 (five) times daily. 150 Bottle 3  . omeprazole (PRILOSEC) 20 MG capsule Take 1 capsule (20 mg total) by mouth 2 (two) times daily before a meal. 60 capsule 3  . ondansetron (ZOFRAN) 4 MG tablet Take 1 tablet (4 mg total) by mouth every 4 (four) hours as needed for nausea or vomiting. 45 tablet 0  . oxycodone (ROXICODONE) 30 MG immediate release tablet Take one tablet by mouth up to 7 (seven) times daily as needed for pain 210 tablet 0  . phenol (CHLORASEPTIC) 1.4 % LIQD Use as directed 1 spray in the mouth or throat as needed for throat irritation / pain. 177 mL 0  . polyvinyl alcohol (LIQUIFILM TEARS) 1.4 % ophthalmic solution Place 1 drop into both eyes as needed for dry eyes. 15 mL 0  . traZODone (DESYREL) 100 MG tablet Take 100 mg by mouth at bedtime.    Marland Kitchen nystatin (MYCOSTATIN) 100000 UNIT/ML  suspension TAKE 5 MLS BY MOUTH 3 (THREE) TIMES DAILY. SWISH AND SPIT.  0   No current facility-administered medications for this visit.   Facility-Administered Medications Ordered in Other Visits  Medication Dose Route Frequency Provider Last Rate Last Dose  . sodium chloride 0.9 % injection 10 mL  10 mL Intracatheter PRN Evlyn Kanner, NP   10 mL at 09/10/15 0948  . sodium chloride 0.9 % injection 10 mL  10 mL Intravenous PRN Forest Gleason, MD   10 mL at 10/24/15 1000    PHYSICAL EXAMINATION: ECOG PERFORMANCE STATUS: 3 - Symptomatic, >50% confined to bed  BP 104/63 mmHg  Pulse 94  Temp(Src) 97.2 F (36.2 C) (Tympanic)  Resp 18  Wt 118 lb 2.7 oz (53.6 kg)  Filed Weights   02/18/16 1004  Weight: 118 lb 2.7 oz (53.6 kg)    GENERAL: Thin build cachectic male patient with nasal cannula oxygen. He is  with his wife. He is in a wheelchair.;  Accompanied by his wife.  EYES: no pallor or icterus OROPHARYNX: no thrush or ulceration; dentures.  NECK: supple, no masses felt LYMPH:  no palpable lymphadenopathy in the cervical, axillary or inguinal regions LUNGS:Bil decreased breath sounds;   No wheeze or crackles HEART/CVS: regular rate & rhythm and no murmurs; No lower extremity edema ABDOMEN:abdomen soft, non-tender and normal bowel sounds Musculoskeletal:no cyanosis of digits and no clubbing; muscular atrophy.   PSYCH: alert & oriented x 3 with fluent speech NEURO: no focal motor/sensory deficits SKIN:  no rashes or significant lesions  LABORATORY DATA:  I have reviewed the data as listed    Component Value Date/Time   NA 132* 02/07/2016 2014   NA 132* 02/20/2014 1732   K 4.1 02/07/2016 2014   K 3.7 02/20/2014 1732   CL 100* 02/07/2016 2014   CL 102 02/20/2014 1732   CO2 26 02/07/2016 2014   CO2 20* 02/20/2014 1732   GLUCOSE 107* 02/07/2016 2014   GLUCOSE 106* 02/20/2014 1732   BUN 18 02/07/2016 2014   BUN 6* 02/20/2014 1732   CREATININE 0.50* 02/08/2016 0245    CREATININE 0.77 02/20/2014 1732   CALCIUM 8.5* 02/07/2016 2014   CALCIUM 8.7 02/20/2014 1732   PROT 7.6 02/05/2016 1504   PROT 7.9 02/20/2014 1732   ALBUMIN 2.9* 02/05/2016 1504   ALBUMIN 3.6 02/20/2014 1732   AST 18 02/05/2016 1504   AST 56* 02/20/2014 1732   ALT 10* 02/05/2016 1504   ALT 45 02/20/2014 1732   ALKPHOS 69 02/05/2016 1504   ALKPHOS 89 02/20/2014 1732   BILITOT 0.2* 02/05/2016 1504   BILITOT 0.6 02/20/2014 1732   GFRNONAA >60 02/08/2016 0245   GFRNONAA >60 02/20/2014 1732   GFRAA >60 02/08/2016 0245   GFRAA >60 02/20/2014 1732    No results found for: SPEP, UPEP  Lab Results  Component Value Date   WBC 8.7 02/08/2016   NEUTROABS 7.2* 02/07/2016   HGB 9.4* 02/08/2016   HCT 29.0* 02/08/2016   MCV 78.4* 02/08/2016   PLT 294 02/08/2016      Chemistry      Component Value Date/Time   NA 132* 02/07/2016 2014   NA 132* 02/20/2014 1732   K 4.1 02/07/2016 2014   K 3.7 02/20/2014 1732   CL 100* 02/07/2016 2014   CL 102 02/20/2014 1732   CO2 26 02/07/2016 2014   CO2 20* 02/20/2014 1732   BUN 18 02/07/2016 2014   BUN 6* 02/20/2014 1732   CREATININE 0.50* 02/08/2016 0245   CREATININE 0.77 02/20/2014 1732      Component Value Date/Time   CALCIUM 8.5* 02/07/2016 2014   CALCIUM 8.7 02/20/2014 1732   ALKPHOS 69 02/05/2016 1504   ALKPHOS 89 02/20/2014 1732   AST 18 02/05/2016 1504   AST 56* 02/20/2014 1732   ALT 10* 02/05/2016 1504   ALT 45 02/20/2014 1732   BILITOT 0.2* 02/05/2016 1504   BILITOT 0.6 02/20/2014 1732        ASSESSMENT & PLAN:   # ? Recurrent Left Tongue base cancer- Based on imaging. Recommend ENT evaluation with Dr. Tami Ribas. I also spoke to Dr. Donella Stade from radiation oncology who feels the patient could be irradiated if this is recurrent malignancy.  # Chronic aspiration- no evidence of any acute aspiration pneumonitis at this time.  # Debility/ PEG tube dependent feeding.  # Will get a CBC CMP today. Patient will follow-up with  Dr. Oliva Bustard approximately  10 days/ for consideration of concurrent chemotherapy carbotaxol with radiation. I have also spoken to Dr. Tami Ribas; who agrees with the reevaluation/biopsy.   I reviewed the imaging myself reviewed the imaging with the patient/wife in detail.  # 25 minutes face-to-face with the patient discussing the above plan of care; more than 50% of time spent on prognosis/ natural history; counseling and coordination.     Cammie Sickle, MD 02/18/2016 10:36 AM

## 2016-02-19 DIAGNOSIS — I10 Essential (primary) hypertension: Secondary | ICD-10-CM | POA: Diagnosis not present

## 2016-02-19 DIAGNOSIS — C069 Malignant neoplasm of mouth, unspecified: Secondary | ICD-10-CM | POA: Diagnosis not present

## 2016-02-19 DIAGNOSIS — Z9981 Dependence on supplemental oxygen: Secondary | ICD-10-CM | POA: Diagnosis not present

## 2016-02-19 DIAGNOSIS — K219 Gastro-esophageal reflux disease without esophagitis: Secondary | ICD-10-CM | POA: Diagnosis not present

## 2016-02-19 DIAGNOSIS — K259 Gastric ulcer, unspecified as acute or chronic, without hemorrhage or perforation: Secondary | ICD-10-CM | POA: Diagnosis not present

## 2016-02-19 DIAGNOSIS — Z431 Encounter for attention to gastrostomy: Secondary | ICD-10-CM | POA: Diagnosis not present

## 2016-02-19 DIAGNOSIS — G894 Chronic pain syndrome: Secondary | ICD-10-CM | POA: Diagnosis not present

## 2016-02-19 DIAGNOSIS — M1990 Unspecified osteoarthritis, unspecified site: Secondary | ICD-10-CM | POA: Diagnosis not present

## 2016-02-19 DIAGNOSIS — J690 Pneumonitis due to inhalation of food and vomit: Secondary | ICD-10-CM | POA: Diagnosis not present

## 2016-02-19 DIAGNOSIS — E43 Unspecified severe protein-calorie malnutrition: Secondary | ICD-10-CM | POA: Diagnosis not present

## 2016-02-19 DIAGNOSIS — Z85818 Personal history of malignant neoplasm of other sites of lip, oral cavity, and pharynx: Secondary | ICD-10-CM | POA: Diagnosis not present

## 2016-02-19 DIAGNOSIS — R1310 Dysphagia, unspecified: Secondary | ICD-10-CM | POA: Diagnosis not present

## 2016-02-19 DIAGNOSIS — D649 Anemia, unspecified: Secondary | ICD-10-CM | POA: Diagnosis not present

## 2016-02-20 ENCOUNTER — Ambulatory Visit
Admission: RE | Admit: 2016-02-20 | Discharge: 2016-02-20 | Disposition: A | Discharge status: Home / Self Care | Payer: Medicare Other | Source: Ambulatory Visit | Attending: Radiation Oncology | Admitting: Radiation Oncology

## 2016-02-20 ENCOUNTER — Encounter: Payer: Self-pay | Admitting: Radiation Oncology

## 2016-02-20 VITALS — BP 113/78 | HR 91 | Temp 96.0°F | Resp 20 | Wt 115.4 lb

## 2016-02-20 DIAGNOSIS — C029 Malignant neoplasm of tongue, unspecified: Secondary | ICD-10-CM | POA: Diagnosis not present

## 2016-02-20 DIAGNOSIS — R070 Pain in throat: Secondary | ICD-10-CM | POA: Diagnosis not present

## 2016-02-20 DIAGNOSIS — G4489 Other headache syndrome: Secondary | ICD-10-CM | POA: Insufficient documentation

## 2016-02-20 DIAGNOSIS — Z9221 Personal history of antineoplastic chemotherapy: Secondary | ICD-10-CM | POA: Insufficient documentation

## 2016-02-20 DIAGNOSIS — Z8701 Personal history of pneumonia (recurrent): Secondary | ICD-10-CM | POA: Insufficient documentation

## 2016-02-20 DIAGNOSIS — Z87891 Personal history of nicotine dependence: Secondary | ICD-10-CM | POA: Diagnosis not present

## 2016-02-20 DIAGNOSIS — J449 Chronic obstructive pulmonary disease, unspecified: Secondary | ICD-10-CM | POA: Insufficient documentation

## 2016-02-20 DIAGNOSIS — C01 Malignant neoplasm of base of tongue: Secondary | ICD-10-CM | POA: Insufficient documentation

## 2016-02-20 DIAGNOSIS — R4702 Dysphasia: Secondary | ICD-10-CM | POA: Insufficient documentation

## 2016-02-20 DIAGNOSIS — D105 Benign neoplasm of other parts of oropharynx: Secondary | ICD-10-CM | POA: Diagnosis not present

## 2016-02-20 DIAGNOSIS — Z510 Encounter for antineoplastic radiation therapy: Secondary | ICD-10-CM | POA: Insufficient documentation

## 2016-02-20 NOTE — Progress Notes (Signed)
Radiation Oncology Follow up Note  Name: Grant Lee   Date:   02/20/2016 MRN:  [id number] DOB: 04/02/1956                                             OP N/A old patient new area head and neck cancer  This 60 y.o. 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 His initial history dates back to 2006 when he had resection of base of tongue squamous cell carcinoma followed by concurrent chemoradiation up to 7000 cGy. He developed recurrent disease back in September 2016 again T3 N0 lesion started on concurrent chemoradiation which he completed in November 2016. He received cetuximab with radiation at that time. He had poor tolerance to cisplatin-based therapy. He recently was hospitalized with bilateral pneumonia aspiration pneumonia is having increasing head and neck pain and dysphasia. He has significant comorbidities including COPD and difficulty ambulating.Marland Kitchen He had a repeat PET CT scan earlier this month showing progression of disease in the base of tongue with hypermetabolic activity both in the base of tongue as well as left sided vallecula and puriform sinus. Again continues to have narcotic dependent pain in the head and neck region and marked dysphasia. He is having a biopsy of his base of tongue today and will review those results when available. I've been asked to evaluate the patient for further palliative radiation therapy.  COMPLICATIONS OF TREATMENT: none  FOLLOW UP COMPLIANCE: keeps appointments   PHYSICAL EXAM:  BP 113/78 mmHg  Pulse 91  Temp(Src) 96 F (35.6 C)  Resp 20  Wt 115 lb 6.6 oz (52.35 kg) Oral cavity is clear. Patient is thin contracted in moderate pain discomfort. No oral mucosal  lesions are identified indirect mirror examination shows fullness in the base of tongue upper airway clear. Neck is clear without evidence of subject gastric cervical or supraclavicular adenopathy. Well-developed well-nourished patient in NAD. HEENT reveals PERLA, EOMI, discs not visualized.  Oral cavity is clear. No oral mucosal lesions are identified. Neck is clear without evidence of cervical or supraclavicular adenopathy. Lungs are clear to A&P. Cardiac examination is essentially unremarkable with regular rate and rhythm without murmur rub or thrill. Abdomen is benign with no organomegaly or masses noted. Motor sensory and DTR levels are equal and symmetric in the upper and lower extremities. Cranial nerves II through XII are grossly intact. Proprioception is intact. No peripheral adenopathy or edema is identified. No motor or sensory levels are noted. Crude visual fields are within normal range.  RADIOLOGY RESULTS: PET CT scan is reviewed  PLAN: At this time like to review his pathology of his repeat biopsy which will be performed today. Could possibly go ahead with another 3000 cGy in hyperfractionated dose scheme of 100 cGy twice a day over 3 weeks to the areas of PET positivity. This may alleviate some of the pain and help with some of the dysphasia. Will discuss the case with medical oncology once the biopsy is complete. I have set the patient up for follow-up next week to review his pathology and make further treatment recommendations.  I would like  to take this opportunity for allowing me to participate in the care of your patient.Armstead Peaks., MD

## 2016-02-21 DIAGNOSIS — R1310 Dysphagia, unspecified: Secondary | ICD-10-CM | POA: Diagnosis not present

## 2016-02-21 DIAGNOSIS — C069 Malignant neoplasm of mouth, unspecified: Secondary | ICD-10-CM | POA: Diagnosis not present

## 2016-02-21 DIAGNOSIS — I10 Essential (primary) hypertension: Secondary | ICD-10-CM | POA: Diagnosis not present

## 2016-02-21 DIAGNOSIS — Z9981 Dependence on supplemental oxygen: Secondary | ICD-10-CM | POA: Diagnosis not present

## 2016-02-21 DIAGNOSIS — G894 Chronic pain syndrome: Secondary | ICD-10-CM | POA: Diagnosis not present

## 2016-02-21 DIAGNOSIS — J690 Pneumonitis due to inhalation of food and vomit: Secondary | ICD-10-CM | POA: Diagnosis not present

## 2016-02-21 DIAGNOSIS — Z431 Encounter for attention to gastrostomy: Secondary | ICD-10-CM | POA: Diagnosis not present

## 2016-02-21 DIAGNOSIS — K259 Gastric ulcer, unspecified as acute or chronic, without hemorrhage or perforation: Secondary | ICD-10-CM | POA: Diagnosis not present

## 2016-02-21 DIAGNOSIS — D649 Anemia, unspecified: Secondary | ICD-10-CM | POA: Diagnosis not present

## 2016-02-21 DIAGNOSIS — Z85818 Personal history of malignant neoplasm of other sites of lip, oral cavity, and pharynx: Secondary | ICD-10-CM | POA: Diagnosis not present

## 2016-02-21 DIAGNOSIS — K219 Gastro-esophageal reflux disease without esophagitis: Secondary | ICD-10-CM | POA: Diagnosis not present

## 2016-02-21 DIAGNOSIS — M1990 Unspecified osteoarthritis, unspecified site: Secondary | ICD-10-CM | POA: Diagnosis not present

## 2016-02-21 DIAGNOSIS — E43 Unspecified severe protein-calorie malnutrition: Secondary | ICD-10-CM | POA: Diagnosis not present

## 2016-02-22 ENCOUNTER — Telehealth: Payer: Self-pay | Admitting: Internal Medicine

## 2016-02-22 NOTE — Telephone Encounter (Signed)
Spoke to patient's wife regarding the discussion of the tumor conference/by Dr. Tami Ribas- given the imaging findings likely recurrent cancer/biopsy not recommended. I will speak to Dr. Donella Stade when available- regarding Dr. Ileene Hutchinson recommendations. Post-palliative radiation/chemotherapy could be recommended. Patient is not a good candidate for chemotherapy.

## 2016-02-23 ENCOUNTER — Other Ambulatory Visit: Payer: Self-pay | Admitting: Oncology

## 2016-02-26 ENCOUNTER — Other Ambulatory Visit: Payer: Self-pay | Admitting: Family Medicine

## 2016-02-26 DIAGNOSIS — M1990 Unspecified osteoarthritis, unspecified site: Secondary | ICD-10-CM | POA: Diagnosis not present

## 2016-02-26 DIAGNOSIS — I10 Essential (primary) hypertension: Secondary | ICD-10-CM | POA: Diagnosis not present

## 2016-02-26 DIAGNOSIS — Z9981 Dependence on supplemental oxygen: Secondary | ICD-10-CM | POA: Diagnosis not present

## 2016-02-26 DIAGNOSIS — Z431 Encounter for attention to gastrostomy: Secondary | ICD-10-CM | POA: Diagnosis not present

## 2016-02-26 DIAGNOSIS — G894 Chronic pain syndrome: Secondary | ICD-10-CM | POA: Diagnosis not present

## 2016-02-26 DIAGNOSIS — R1310 Dysphagia, unspecified: Secondary | ICD-10-CM | POA: Diagnosis not present

## 2016-02-26 DIAGNOSIS — Z85818 Personal history of malignant neoplasm of other sites of lip, oral cavity, and pharynx: Secondary | ICD-10-CM | POA: Diagnosis not present

## 2016-02-26 DIAGNOSIS — K219 Gastro-esophageal reflux disease without esophagitis: Secondary | ICD-10-CM | POA: Diagnosis not present

## 2016-02-26 DIAGNOSIS — J690 Pneumonitis due to inhalation of food and vomit: Secondary | ICD-10-CM | POA: Diagnosis not present

## 2016-02-26 DIAGNOSIS — K259 Gastric ulcer, unspecified as acute or chronic, without hemorrhage or perforation: Secondary | ICD-10-CM | POA: Diagnosis not present

## 2016-02-26 DIAGNOSIS — D649 Anemia, unspecified: Secondary | ICD-10-CM | POA: Diagnosis not present

## 2016-02-26 DIAGNOSIS — C069 Malignant neoplasm of mouth, unspecified: Secondary | ICD-10-CM | POA: Diagnosis not present

## 2016-02-26 DIAGNOSIS — E43 Unspecified severe protein-calorie malnutrition: Secondary | ICD-10-CM | POA: Diagnosis not present

## 2016-02-27 ENCOUNTER — Inpatient Hospital Stay: Payer: Medicare Other | Admitting: Oncology

## 2016-02-27 DIAGNOSIS — M6281 Muscle weakness (generalized): Secondary | ICD-10-CM | POA: Diagnosis not present

## 2016-02-27 DIAGNOSIS — R1310 Dysphagia, unspecified: Secondary | ICD-10-CM | POA: Diagnosis not present

## 2016-02-28 ENCOUNTER — Encounter: Payer: Self-pay | Admitting: Oncology

## 2016-02-28 ENCOUNTER — Ambulatory Visit: Payer: Medicare Other | Admitting: Oncology

## 2016-02-28 ENCOUNTER — Inpatient Hospital Stay (HOSPITAL_BASED_OUTPATIENT_CLINIC_OR_DEPARTMENT_OTHER): Payer: Medicare Other | Admitting: Oncology

## 2016-02-28 ENCOUNTER — Ambulatory Visit
Admission: RE | Admit: 2016-02-28 | Discharge: 2016-02-28 | Disposition: A | Discharge status: Home / Self Care | Payer: Medicare Other | Source: Ambulatory Visit | Attending: Radiation Oncology | Admitting: Radiation Oncology

## 2016-02-28 ENCOUNTER — Encounter: Payer: Self-pay | Admitting: Radiation Oncology

## 2016-02-28 VITALS — BP 120/77 | HR 80 | Temp 96.4°F | Resp 18 | Wt 117.4 lb

## 2016-02-28 VITALS — BP 117/77 | HR 85 | Temp 97.5°F | Resp 20 | Wt 117.0 lb

## 2016-02-28 DIAGNOSIS — Z862 Personal history of diseases of the blood and blood-forming organs and certain disorders involving the immune mechanism: Secondary | ICD-10-CM | POA: Diagnosis not present

## 2016-02-28 DIAGNOSIS — Z9221 Personal history of antineoplastic chemotherapy: Secondary | ICD-10-CM | POA: Diagnosis not present

## 2016-02-28 DIAGNOSIS — C01 Malignant neoplasm of base of tongue: Secondary | ICD-10-CM

## 2016-02-28 DIAGNOSIS — R0602 Shortness of breath: Secondary | ICD-10-CM | POA: Diagnosis not present

## 2016-02-28 DIAGNOSIS — J449 Chronic obstructive pulmonary disease, unspecified: Secondary | ICD-10-CM | POA: Diagnosis not present

## 2016-02-28 DIAGNOSIS — I959 Hypotension, unspecified: Secondary | ICD-10-CM

## 2016-02-28 DIAGNOSIS — K219 Gastro-esophageal reflux disease without esophagitis: Secondary | ICD-10-CM | POA: Diagnosis not present

## 2016-02-28 DIAGNOSIS — D649 Anemia, unspecified: Secondary | ICD-10-CM

## 2016-02-28 DIAGNOSIS — I10 Essential (primary) hypertension: Secondary | ICD-10-CM | POA: Diagnosis not present

## 2016-02-28 DIAGNOSIS — Z79899 Other long term (current) drug therapy: Secondary | ICD-10-CM | POA: Diagnosis not present

## 2016-02-28 DIAGNOSIS — C029 Malignant neoplasm of tongue, unspecified: Secondary | ICD-10-CM

## 2016-02-28 DIAGNOSIS — R05 Cough: Secondary | ICD-10-CM | POA: Diagnosis not present

## 2016-02-28 DIAGNOSIS — E43 Unspecified severe protein-calorie malnutrition: Secondary | ICD-10-CM | POA: Diagnosis not present

## 2016-02-28 DIAGNOSIS — M129 Arthropathy, unspecified: Secondary | ICD-10-CM | POA: Diagnosis not present

## 2016-02-28 DIAGNOSIS — G894 Chronic pain syndrome: Secondary | ICD-10-CM | POA: Diagnosis not present

## 2016-02-28 DIAGNOSIS — M542 Cervicalgia: Secondary | ICD-10-CM | POA: Diagnosis not present

## 2016-02-28 DIAGNOSIS — Z87891 Personal history of nicotine dependence: Secondary | ICD-10-CM | POA: Diagnosis not present

## 2016-02-28 DIAGNOSIS — Z923 Personal history of irradiation: Secondary | ICD-10-CM | POA: Diagnosis not present

## 2016-02-28 DIAGNOSIS — Z8711 Personal history of peptic ulcer disease: Secondary | ICD-10-CM | POA: Diagnosis not present

## 2016-02-28 DIAGNOSIS — Z931 Gastrostomy status: Secondary | ICD-10-CM | POA: Diagnosis not present

## 2016-02-28 DIAGNOSIS — R1310 Dysphagia, unspecified: Secondary | ICD-10-CM | POA: Diagnosis not present

## 2016-02-28 MED ORDER — FENTANYL 100 MCG/HR TD PT72: 100.0000 ug | MEDICATED_PATCH | TRANSDERMAL | Status: DC

## 2016-02-28 MED ORDER — MORPHINE SULFATE (CONCENTRATE) 20 MG/ML PO SOLN: 20.0000 mg | ORAL | Status: DC | PRN

## 2016-02-28 MED ORDER — PREDNISONE 5 MG/5ML PO SOLN: 20.0000 mg | Freq: Every day | ORAL | Status: DC

## 2016-02-28 NOTE — Progress Notes (Signed)
Radiation Oncology Follow up Note  Name: Grant Lee   Date:   02/28/2016 MRN:  [id number] DOB: 12/11/1955    This 60 y.o. 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  REFERRING PROVIDER: Cyndi Bender, PA-C  HPI: Patient is a 60 year old male well known to our department having been treated twice for head and neck cancer to the base of tongue initially treated back in 2006. He developed recurrent cancer in September 2016 again a T3 N0 lesion again treated with concurrent chemoradiation. He is now presented with increasing head and neck pain narcotic dependent and repeat PET CT scan show progression of disease and base of tongue with hypermetabolic activity in the base of tongue as well as left sided vallecula and. Warm sinus. We brought his case up at tumor conference and ENT and radiology were both consistent that this is recurrent disease biopsy was not indicated. He is seen today for consideration of further palliative treatment. He continues to have mild significant mucus production he is being fed on up PEG tube and again is having significant pain even on narcotic analgesics..  COMPLICATIONS OF TREATMENT: none  FOLLOW UP COMPLIANCE: keeps appointments   PHYSICAL EXAM:  BP 117/77 mmHg  Pulse 85  Temp(Src) 97.5 F (36.4 C)  Resp 20  Wt 116 lb 15.3 oz (53.05 kg) Thin cachectic male in NAD. He has significant trismus of his mouth making evaluation of the base of tongue difficult. No neck adenopathy is detected. He does have a PEG tube placed. Well-developed well-nourished patient in NAD. HEENT reveals PERLA, EOMI, discs not visualized.  Oral cavity is clear. No oral mucosal lesions are identified. Neck is clear without evidence of cervical or supraclavicular adenopathy. Lungs are clear to A&P. Cardiac examination is  essentially unremarkable with regular rate and rhythm without murmur rub or thrill. Abdomen is benign with no organomegaly or masses noted. Motor sensory and DTR levels are equal and symmetric in the upper and lower extremities. Cranial nerves II through XII are grossly intact. Proprioception is intact. No peripheral adenopathy or edema is identified. No motor or sensory levels are noted. Crude visual fields are within normal range.  RADIOLOGY RESULTS: PET CT scan is again reviewed  PLAN: At this time I to go on a short palliative course of radiation using hyperfractionated radiation therapy. Would plan on delivering 3000 cGy over 3 weeks at 100 cGy twice a day based on his previous 2 treatments with radiation therapy. I will target all of those areas that are PET positive using CT fusion study. Again risks and benefits of treatment including increased dysphasia head and neck pain problems with retreatment using radiation fatigue alteration of blood counts all were described in detail to the patient. I personally set up and ordered CT simulation early next week.  I would like to take this opportunity for allowing me to participate in the care of your patient.Armstead Peaks., MD

## 2016-02-28 NOTE — Progress Notes (Signed)
Patient states he is in so much pain.  His pain meds are not covering his pain.  Not sleeping due to pain.  Also asking for Hospice referral in the home.

## 2016-02-29 ENCOUNTER — Encounter: Payer: Self-pay | Admitting: Oncology

## 2016-02-29 DIAGNOSIS — D649 Anemia, unspecified: Secondary | ICD-10-CM | POA: Diagnosis not present

## 2016-02-29 DIAGNOSIS — J690 Pneumonitis due to inhalation of food and vomit: Secondary | ICD-10-CM | POA: Diagnosis not present

## 2016-02-29 DIAGNOSIS — M1990 Unspecified osteoarthritis, unspecified site: Secondary | ICD-10-CM | POA: Diagnosis not present

## 2016-02-29 DIAGNOSIS — Z431 Encounter for attention to gastrostomy: Secondary | ICD-10-CM | POA: Diagnosis not present

## 2016-02-29 DIAGNOSIS — C069 Malignant neoplasm of mouth, unspecified: Secondary | ICD-10-CM | POA: Diagnosis not present

## 2016-02-29 DIAGNOSIS — K219 Gastro-esophageal reflux disease without esophagitis: Secondary | ICD-10-CM | POA: Diagnosis not present

## 2016-02-29 DIAGNOSIS — Z9981 Dependence on supplemental oxygen: Secondary | ICD-10-CM | POA: Diagnosis not present

## 2016-02-29 DIAGNOSIS — K259 Gastric ulcer, unspecified as acute or chronic, without hemorrhage or perforation: Secondary | ICD-10-CM | POA: Diagnosis not present

## 2016-02-29 DIAGNOSIS — E43 Unspecified severe protein-calorie malnutrition: Secondary | ICD-10-CM | POA: Diagnosis not present

## 2016-02-29 DIAGNOSIS — Z85818 Personal history of malignant neoplasm of other sites of lip, oral cavity, and pharynx: Secondary | ICD-10-CM | POA: Diagnosis not present

## 2016-02-29 DIAGNOSIS — I10 Essential (primary) hypertension: Secondary | ICD-10-CM | POA: Diagnosis not present

## 2016-02-29 DIAGNOSIS — G894 Chronic pain syndrome: Secondary | ICD-10-CM | POA: Diagnosis not present

## 2016-02-29 DIAGNOSIS — R1310 Dysphagia, unspecified: Secondary | ICD-10-CM | POA: Diagnosis not present

## 2016-02-29 NOTE — Progress Notes (Signed)
[place] @ [facility] Telephone:(336) [phone]  Fax:(336) [place] OB: 1956-09-25  MR#: [id number]  TIR#:[id number]  Patient Care Team: Cyndi Bender, PA-C as PCP - General (Physician Assistant) Beverly Gust, MD (Unknown Physician Specialty)  CHIEF COMPLAINT:  Chief Complaint  Patient presents with  . Tongue Cancer   1.  Has a history of cancer of the tongue in 2006.  Patient underwent resection followed by radiation therapy 2.  Increasing difficulty in swallowing for last 2 or 3 weeks.  Patient had upper endoscopy done in December of 2015. 3.  Significant weight loss 4.  Abnormal PET scan (August of 2016) 5.  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  (Cis-platinum and radiation therapy) (September 2 016) T3 N0 M0 tumor Status post PEG tube placement (September, 2016) 6. Poor tolerance dose cis-platinum therapy, so patient has been switched over to cetuximab along with radiation therapy (October, 2016) 7.  Patient has finished cetuximab as well as radiation therapy by 23rd of November 2 016 8.  Patient was recently hospitalized with bilateral pneumonia, aspiration pneumonia and was transferred to rehabilitation (December, 2016) 9.It is going to be started on palliative radiation therapy  VISIT DIAGNOSIS:  Significant weight loss. Difficulty swallowing. History of carcinoma of tongue      INTERVAL HISTORY:  Is not came as an acute and on complaining of cough and greenish yellowish expectoration no chills or fever of 2 weeks duration. Sore throat.  Not able to swallow anything. Feeling weak and tired. He is here for ongoing evaluation and treatment consideration.  Patient has increasing pain.  A recent PET scan has been reviewed independently and shows progressive disease. Pain is constant dull aching.  He is taking OxyContin 30 mg every 3 hours without much  relief in pain Here for further follow-up and treatment consideration  REVIEW OF SYSTEMS:   Gen. status: Patient is feeling weak and tired.  Has lost significant weight Continues to lose weight.  Significant pain. Recently hospitalized with bilateral pneumonia Poor appetite patient is very depressed.  Losing weight. Increasing difficulty swallowing as mentioned in history of present illness HEENT: As mentioned in history of present illness patient had history of carcinoma of tongue status post radiation therapy recently having increasing soreness in the mouth.  Increasing pain localized in both sides of the neck.  Taking oxycodone 30 mg 8 times a day Lungs: Increasing cough shortness of breath yellowish expectoration no fever no hemoptysis GI: Persistent nausea as described above in history of present illness [place] close skeletal system no bony pain Lower extremity no swelling Skin: No rash Abdomen: Had a PEG tube placement  As per HPI. Otherwise, a complete review of systems is negatve.  PAST MEDICAL HISTORY: Carcinoma of tongue Hypertension Hypercholesterolemia Previous substance abuse Gastroesophageal reflux disease  PAST SURGICAL HISTORY: Treated for carcinoma of tongue FAMILY HISTORY There is no significant family history of breast cancer, ovarian cancer, colon cancer    ADVANCED DIRECTIVES:  Patient does not have any living will or healthcare power of attorney.  Information was given .  Available resources had been discussed.  We will follow-up on subsequent appointments regarding this issue  HEALTH MAINTENANCE: Social History  Substance Use Topics  . Smoking status: Former Smoker -- 1.00 packs/day for 0 years    Types: Cigarettes    Quit date: 09/21/2014  . Smokeless tobacco: None  . Alcohol Use: 7.2 oz/week  12 Cans of beer per week     Comment: beer/wine every day    Quit smoking in November of 2015.  History of smoking for several years in the past.  No  Known Allergies  Current Outpatient Prescriptions  Medication Sig Dispense Refill  . citalopram (CELEXA) 20 MG tablet Take 1 tablet (20 mg total) by mouth daily. 30 tablet 3  . guaiFENesin (ROBITUSSIN) 100 MG/5ML SOLN Take 5 mLs (100 mg total) by mouth every 4 (four) hours as needed for cough or to loosen phlegm. 1200 mL 0  . Nutritional Supplements (FEEDING SUPPLEMENT, OSMOLITE 1.5 CAL,) LIQD Place 240 mLs into feeding tube 5 (five) times daily. 150 Bottle 3  . omeprazole (PRILOSEC) 20 MG capsule TAKE ONE CAPSULE BY MOUTH TWICE A DAY BEFORE MEALS 60 capsule 0  . ondansetron (ZOFRAN) 4 MG tablet Take 1 tablet (4 mg total) by mouth every 4 (four) hours as needed for nausea or vomiting. 45 tablet 0  . phenol (CHLORASEPTIC) 1.4 % LIQD Use as directed 1 spray in the mouth or throat as needed for throat irritation / pain. 177 mL 0  . polyvinyl alcohol (LIQUIFILM TEARS) 1.4 % ophthalmic solution Place 1 drop into both eyes as needed for dry eyes. 15 mL 0  . traZODone (DESYREL) 100 MG tablet Take 100 mg by mouth at bedtime.    . fentaNYL (DURAGESIC - DOSED MCG/HR) 100 MCG/HR Place 1 patch (100 mcg total) onto the skin every 3 (three) days. 10 patch 0  . morphine (ROXANOL) 20 MG/ML concentrated solution Place 1 mL (20 mg total) into feeding tube every hour as needed for severe pain. 120 mL 0  . oxycodone (ROXICODONE) 30 MG immediate release tablet TAKE 1 TABLET BY MOUTH UP TO 7 TIMES DAILY AS NEEDED FOR PAIN  0  . predniSONE 5 MG/5ML solution Place 20 mLs (20 mg total) into feeding tube daily with breakfast. 100 mL 3   No current facility-administered medications for this visit.   Facility-Administered Medications Ordered in Other Visits  Medication Dose Route Frequency Provider Last Rate Last Dose  . sodium chloride 0.9 % injection 10 mL  10 mL Intracatheter PRN Evlyn Kanner, NP   10 mL at 09/10/15 0948  . sodium chloride 0.9 % injection 10 mL  10 mL Intravenous PRN Forest Gleason, MD   10 mL at  10/24/15 1000    OBJECTIVE: PHYSICAL EXAM: Gen. status: Patient is seen lean and cachectic. Feeling extremely weak and tired.  This membranes are dry skin turgor poor In wheelchair.  Performance status is 3 HEENT: No soreness in the mouth.  But swelling which is diffuse Lymphatic system: Supraclavicular, cervical, axillary, inguinal lymph nodes are not palpable Lungs: Bilateral rhonchi and occasional crepitation. Cardiac: Tachycardia Examination of the skin revealed no evidence of significant rashes, suspicious appearing nevi or other concerning lesions.. Abdominal exam revealed normal bowel sounds. The abdomen was soft, non-tender, and without masses, organomegaly, or appreciable enlargement of the abdominal aorta..   Peg  placement Psychiatric system: Depression and anxiety  Filed Vitals:   02/28/16 1136  BP: 120/77  Pulse: 80  Temp: 96.4 F (35.8 C)  Resp: 18     Body mass index is 15.07 kg/(m^2).    ECOG FS:3 - Symptomatic, >50% confined to bed  LAB RESULTS:  No visits with results within 2 Day(s) from this visit. Latest known visit with results is:  Appointment on 02/18/2016  Component Date Value Ref Range Status  . WBC  02/18/2016 8.7  3.8 - 10.6 K/uL Final  . RBC 02/18/2016 4.08* 4.40 - 5.90 MIL/uL Final  . Hemoglobin 02/18/2016 10.5* 13.0 - 18.0 g/dL Final  . HCT 02/18/2016 32.4* 40.0 - 52.0 % Final  . MCV 02/18/2016 79.3* 80.0 - 100.0 fL Final  . MCH 02/18/2016 25.8* 26.0 - 34.0 pg Final  . MCHC 02/18/2016 32.5  32.0 - 36.0 g/dL Final  . RDW 02/18/2016 17.2* 11.5 - 14.5 % Final  . Platelets 02/18/2016 378  150 - 440 K/uL Final  . Neutrophils Relative % 02/18/2016 62   Final  . Neutro Abs 02/18/2016 5.4  1.4 - 6.5 K/uL Final  . Lymphocytes Relative 02/18/2016 29   Final  . Lymphs Abs 02/18/2016 2.5  1.0 - 3.6 K/uL Final  . Monocytes Relative 02/18/2016 8   Final  . Monocytes Absolute 02/18/2016 0.7  0.2 - 1.0 K/uL Final  . Eosinophils Relative 02/18/2016 1    Final  . Eosinophils Absolute 02/18/2016 0.1  0 - 0.7 K/uL Final  . Basophils Relative 02/18/2016 0   Final  . Basophils Absolute 02/18/2016 0.0  0 - 0.1 K/uL Final  . Sodium 02/18/2016 130* 135 - 145 mmol/L Final  . Potassium 02/18/2016 4.3  3.5 - 5.1 mmol/L Final  . Chloride 02/18/2016 97* 101 - 111 mmol/L Final  . CO2 02/18/2016 29  22 - 32 mmol/L Final  . Glucose, Bld 02/18/2016 146* 65 - 99 mg/dL Final  . BUN 02/18/2016 16  6 - 20 mg/dL Final  . Creatinine, Ser 02/18/2016 0.59* 0.61 - 1.24 mg/dL Final  . Calcium 02/18/2016 8.7* 8.9 - 10.3 mg/dL Final  . Total Protein 02/18/2016 7.1  6.5 - 8.1 g/dL Final  . Albumin 02/18/2016 3.1* 3.5 - 5.0 g/dL Final  . AST 02/18/2016 21  15 - 41 U/L Final  . ALT 02/18/2016 11* 17 - 63 U/L Final  . Alkaline Phosphatase 02/18/2016 69  38 - 126 U/L Final  . Total Bilirubin 02/18/2016 <0.1* 0.3 - 1.2 mg/dL Final  . GFR calc non Af Amer 02/18/2016 >60  >60 mL/min Final  . GFR calc Af Amer 02/18/2016 >60  >60 mL/min Final   Comment: (NOTE) The eGFR has been calculated using the CKD EPI equation. This calculation has not been validated in all clinical situations. eGFR's persistently <60 mL/min signify possible Chronic Kidney Disease.   . Anion gap 02/18/2016 4* 5 - 15 Final       ASSESSMENT:  A. 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  Carcinoma of tongue in 2006 status post resection and radiation therapy exit staging not known and old records being off pain for review 2.  Hypotension patient has been asked to be off antihypertensive medication 3.  Weight loss has been stabilized as patient is slowly increasing back to feeding\ 4.  Anemia which is multifactorial does not need any transfusion at present time 5.  Aspiration pneumonia chest x-ray has been reviewed  Scan has been reviewed  independently and has progressing disease.  Patient is getting palliative radiation therapy     PLAN:   reGuarding pain control.  Patient would be started on fentanyl patch 100 g  Was given morphine and Roxanol 20 mg per mL 1 mL every hour when necessary Add prednisone 20 mg We discussed at length options of initiating immunotherapy versus  hospice and palliative care Patient did not understand regarding hospice that no further therapy can be done desires further treatment In form of radiation and chemotherapy to relieve pain Plan is to increasing fentanyl patch Patient has a significant tolerance to narcotic medication because of patient's previous history of back pain and taking narcotic pain medication. Total duration of visit was 45 minutes.  50% or more time was spent in counseling patient and family regarding prognosis and options of treatment and available resources    Informed consent has been obtained Intent of chemotherapy is palliation and relief in symptoms and extending [place] has been reviewed        No matching staging information was found for the patient.  Forest Gleason, MD   02/29/2016 4:41 PM

## 2016-03-04 ENCOUNTER — Ambulatory Visit
Admission: RE | Admit: 2016-03-04 | Discharge: 2016-03-04 | Disposition: A | Discharge status: Home / Self Care | Payer: Medicare Other | Source: Ambulatory Visit | Attending: Radiation Oncology | Admitting: Radiation Oncology

## 2016-03-04 DIAGNOSIS — R4702 Dysphasia: Secondary | ICD-10-CM | POA: Diagnosis not present

## 2016-03-04 DIAGNOSIS — Z510 Encounter for antineoplastic radiation therapy: Secondary | ICD-10-CM | POA: Diagnosis not present

## 2016-03-04 DIAGNOSIS — Z8701 Personal history of pneumonia (recurrent): Secondary | ICD-10-CM | POA: Diagnosis not present

## 2016-03-04 DIAGNOSIS — Z9221 Personal history of antineoplastic chemotherapy: Secondary | ICD-10-CM | POA: Diagnosis not present

## 2016-03-04 DIAGNOSIS — C01 Malignant neoplasm of base of tongue: Secondary | ICD-10-CM | POA: Diagnosis not present

## 2016-03-04 DIAGNOSIS — G4489 Other headache syndrome: Secondary | ICD-10-CM | POA: Diagnosis not present

## 2016-03-04 DIAGNOSIS — J449 Chronic obstructive pulmonary disease, unspecified: Secondary | ICD-10-CM | POA: Diagnosis not present

## 2016-03-04 DIAGNOSIS — Z87891 Personal history of nicotine dependence: Secondary | ICD-10-CM | POA: Diagnosis not present

## 2016-03-05 DIAGNOSIS — K259 Gastric ulcer, unspecified as acute or chronic, without hemorrhage or perforation: Secondary | ICD-10-CM | POA: Diagnosis not present

## 2016-03-05 DIAGNOSIS — C069 Malignant neoplasm of mouth, unspecified: Secondary | ICD-10-CM | POA: Diagnosis not present

## 2016-03-05 DIAGNOSIS — G894 Chronic pain syndrome: Secondary | ICD-10-CM | POA: Diagnosis not present

## 2016-03-05 DIAGNOSIS — M1990 Unspecified osteoarthritis, unspecified site: Secondary | ICD-10-CM | POA: Diagnosis not present

## 2016-03-05 DIAGNOSIS — E43 Unspecified severe protein-calorie malnutrition: Secondary | ICD-10-CM | POA: Diagnosis not present

## 2016-03-05 DIAGNOSIS — Z85818 Personal history of malignant neoplasm of other sites of lip, oral cavity, and pharynx: Secondary | ICD-10-CM | POA: Diagnosis not present

## 2016-03-05 DIAGNOSIS — R1310 Dysphagia, unspecified: Secondary | ICD-10-CM | POA: Diagnosis not present

## 2016-03-05 DIAGNOSIS — J690 Pneumonitis due to inhalation of food and vomit: Secondary | ICD-10-CM | POA: Diagnosis not present

## 2016-03-05 DIAGNOSIS — Z431 Encounter for attention to gastrostomy: Secondary | ICD-10-CM | POA: Diagnosis not present

## 2016-03-05 DIAGNOSIS — I10 Essential (primary) hypertension: Secondary | ICD-10-CM | POA: Diagnosis not present

## 2016-03-05 DIAGNOSIS — D649 Anemia, unspecified: Secondary | ICD-10-CM | POA: Diagnosis not present

## 2016-03-05 DIAGNOSIS — Z9981 Dependence on supplemental oxygen: Secondary | ICD-10-CM | POA: Diagnosis not present

## 2016-03-05 DIAGNOSIS — K219 Gastro-esophageal reflux disease without esophagitis: Secondary | ICD-10-CM | POA: Diagnosis not present

## 2016-03-06 ENCOUNTER — Inpatient Hospital Stay (HOSPITAL_BASED_OUTPATIENT_CLINIC_OR_DEPARTMENT_OTHER): Payer: Medicare Other | Admitting: Oncology

## 2016-03-06 ENCOUNTER — Inpatient Hospital Stay: Payer: Medicare Other

## 2016-03-06 VITALS — BP 109/72 | HR 75 | Temp 96.8°F | Resp 18 | Wt 118.1 lb

## 2016-03-06 DIAGNOSIS — C029 Malignant neoplasm of tongue, unspecified: Secondary | ICD-10-CM

## 2016-03-06 DIAGNOSIS — Z8711 Personal history of peptic ulcer disease: Secondary | ICD-10-CM | POA: Diagnosis not present

## 2016-03-06 DIAGNOSIS — R630 Anorexia: Secondary | ICD-10-CM

## 2016-03-06 DIAGNOSIS — Z923 Personal history of irradiation: Secondary | ICD-10-CM | POA: Diagnosis not present

## 2016-03-06 DIAGNOSIS — R1310 Dysphagia, unspecified: Secondary | ICD-10-CM

## 2016-03-06 DIAGNOSIS — R0602 Shortness of breath: Secondary | ICD-10-CM | POA: Diagnosis not present

## 2016-03-06 DIAGNOSIS — E43 Unspecified severe protein-calorie malnutrition: Secondary | ICD-10-CM | POA: Diagnosis not present

## 2016-03-06 DIAGNOSIS — Z79899 Other long term (current) drug therapy: Secondary | ICD-10-CM

## 2016-03-06 DIAGNOSIS — R05 Cough: Secondary | ICD-10-CM

## 2016-03-06 DIAGNOSIS — Z862 Personal history of diseases of the blood and blood-forming organs and certain disorders involving the immune mechanism: Secondary | ICD-10-CM | POA: Diagnosis not present

## 2016-03-06 DIAGNOSIS — M542 Cervicalgia: Secondary | ICD-10-CM

## 2016-03-06 DIAGNOSIS — M129 Arthropathy, unspecified: Secondary | ICD-10-CM | POA: Diagnosis not present

## 2016-03-06 DIAGNOSIS — Z9221 Personal history of antineoplastic chemotherapy: Secondary | ICD-10-CM | POA: Diagnosis not present

## 2016-03-06 DIAGNOSIS — I10 Essential (primary) hypertension: Secondary | ICD-10-CM | POA: Diagnosis not present

## 2016-03-06 DIAGNOSIS — R634 Abnormal weight loss: Secondary | ICD-10-CM

## 2016-03-06 DIAGNOSIS — Z931 Gastrostomy status: Secondary | ICD-10-CM | POA: Diagnosis not present

## 2016-03-06 DIAGNOSIS — K219 Gastro-esophageal reflux disease without esophagitis: Secondary | ICD-10-CM | POA: Diagnosis not present

## 2016-03-06 DIAGNOSIS — G894 Chronic pain syndrome: Secondary | ICD-10-CM | POA: Diagnosis not present

## 2016-03-06 DIAGNOSIS — I959 Hypotension, unspecified: Secondary | ICD-10-CM | POA: Diagnosis not present

## 2016-03-06 DIAGNOSIS — J449 Chronic obstructive pulmonary disease, unspecified: Secondary | ICD-10-CM | POA: Diagnosis not present

## 2016-03-06 DIAGNOSIS — D649 Anemia, unspecified: Secondary | ICD-10-CM | POA: Diagnosis not present

## 2016-03-06 DIAGNOSIS — C01 Malignant neoplasm of base of tongue: Secondary | ICD-10-CM | POA: Diagnosis not present

## 2016-03-06 DIAGNOSIS — Z87891 Personal history of nicotine dependence: Secondary | ICD-10-CM | POA: Diagnosis not present

## 2016-03-06 LAB — CBC WITH DIFFERENTIAL/PLATELET
Basophils Absolute: 0 10*3/uL (ref 0–0.1)
Basophils Relative: 0 %
EOS ABS: 0 10*3/uL (ref 0–0.7)
Eosinophils Relative: 0 %
HCT: 32.1 % — ABNORMAL LOW (ref 40.0–52.0)
HEMOGLOBIN: 10.5 g/dL — AB (ref 13.0–18.0)
LYMPHS ABS: 1.9 10*3/uL (ref 1.0–3.6)
LYMPHS PCT: 18 %
MCH: 25.6 pg — AB (ref 26.0–34.0)
MCHC: 32.6 g/dL (ref 32.0–36.0)
MCV: 78.5 fL — AB (ref 80.0–100.0)
MONOS PCT: 4 %
Monocytes Absolute: 0.4 10*3/uL (ref 0.2–1.0)
NEUTROS PCT: 78 %
Neutro Abs: 8.4 10*3/uL — ABNORMAL HIGH (ref 1.4–6.5)
Platelets: 383 10*3/uL (ref 150–440)
RBC: 4.09 MIL/uL — AB (ref 4.40–5.90)
RDW: 16.7 % — ABNORMAL HIGH (ref 11.5–14.5)
WBC: 10.8 10*3/uL — ABNORMAL HIGH (ref 3.8–10.6)

## 2016-03-06 LAB — COMPREHENSIVE METABOLIC PANEL
ALK PHOS: 67 U/L (ref 38–126)
ALT: 11 U/L — AB (ref 17–63)
ANION GAP: 7 (ref 5–15)
AST: 21 U/L (ref 15–41)
Albumin: 3.1 g/dL — ABNORMAL LOW (ref 3.5–5.0)
BILIRUBIN TOTAL: 0.3 mg/dL (ref 0.3–1.2)
BUN: 19 mg/dL (ref 6–20)
CO2: 25 mmol/L (ref 22–32)
CREATININE: 0.61 mg/dL (ref 0.61–1.24)
Calcium: 9 mg/dL (ref 8.9–10.3)
Chloride: 101 mmol/L (ref 101–111)
Glucose, Bld: 117 mg/dL — ABNORMAL HIGH (ref 65–99)
Potassium: 4.5 mmol/L (ref 3.5–5.1)
SODIUM: 133 mmol/L — AB (ref 135–145)
TOTAL PROTEIN: 7.3 g/dL (ref 6.5–8.1)

## 2016-03-06 LAB — MAGNESIUM: MAGNESIUM: 2 mg/dL (ref 1.7–2.4)

## 2016-03-06 LAB — TSH: TSH: 1.953 u[IU]/mL (ref 0.350–4.500)

## 2016-03-06 MED ORDER — FENTANYL 25 MCG/HR TD PT72: 25.0000 ug | MEDICATED_PATCH | TRANSDERMAL | Status: DC

## 2016-03-06 MED ORDER — MORPHINE SULFATE (CONCENTRATE) 20 MG/ML PO SOLN: 20.0000 mg | ORAL | Status: DC | PRN

## 2016-03-06 MED ORDER — HEPARIN SOD (PORK) LOCK FLUSH 100 UNIT/ML IV SOLN
500.0000 [IU] | Freq: Once | INTRAVENOUS | Status: AC | PRN
  Administered 2016-03-06: 500 [IU]

## 2016-03-06 MED ORDER — SODIUM CHLORIDE 0.9 % IV SOLN
3.0000 mg/kg | Freq: Once | INTRAVENOUS | Status: AC
  Administered 2016-03-06: 160 mg via INTRAVENOUS
  Filled 2016-03-06: qty 16

## 2016-03-06 MED ORDER — SODIUM CHLORIDE 0.9 % IV SOLN
Freq: Once | INTRAVENOUS | Status: AC
  Administered 2016-03-06: 12:00:00 via INTRAVENOUS
  Filled 2016-03-06: qty 1000

## 2016-03-07 LAB — T4: T4 TOTAL: 7 ug/dL (ref 4.5–12.0)

## 2016-03-08 ENCOUNTER — Encounter: Payer: Self-pay | Admitting: Oncology

## 2016-03-08 NOTE — Progress Notes (Signed)
[place] @ [facility] Telephone:(336) [phone]  Fax:(336) [place] OB: [birth year]  MR#: [id number]  ZPH#:[id number]  Patient Care Team: Cyndi Bender, PA-C as PCP - General (Physician Assistant) Beverly Gust, MD (Unknown Physician Specialty)  CHIEF COMPLAINT:  Chief Complaint  Patient presents with  . Cancer of tongue   1.  Has a history of cancer of the tongue in 2006.  Patient underwent resection followed by radiation therapy 2.  Increasing difficulty in swallowing for last 2 or 3 weeks.  Patient had upper endoscopy done in December of 2015. 3.  Significant weight loss 4.  Abnormal PET scan (August of 2016) 5.  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  (Cis-platinum and radiation therapy) (September 2 016) T3 N0 M0 tumor Status post PEG tube placement (September, 2016) 6. Poor tolerance dose cis-platinum therapy, so patient has been switched over to cetuximab along with radiation therapy (October, 2016) 7.  Patient has finished cetuximab as well as radiation therapy by 23rd of November 2 016 8.  Patient was recently hospitalized with bilateral pneumonia, aspiration pneumonia and was transferred to rehabilitation (December, 2016) 9.It is going to be started on palliative radiation therapy. 10Patient was started on NIVOLULMAB from April of 2017  VISIT DIAGNOSIS:  Significant weight loss. Difficulty swallowing. History of carcinoma of tongue      INTERVAL HISTORY:  Is not came as an acute and on complaining of cough and greenish yellowish expectoration no chills or fever of 2 weeks duration. Sore throat.  Not able to swallow anything. Feeling weak and tired. He is here for ongoing evaluation and treatment consideration.  Patient's pain is under better control with fentanyl patch and Roxanol liquid.  Patient is off OxyContin. Here for consideration of  starting anti-PDL drug. Appetite is improving.    REVIEW OF SYSTEMS:   Gen. status: Patient is feeling weak and tired.  Has lost significant weight Continues to lose weight.  Significant pain. Recently hospitalized with bilateral pneumonia Poor appetite patient is very depressed.  Losing weight. Increasing difficulty swallowing as mentioned in history of present illness HEENT: As mentioned in history of present illness patient had history of carcinoma of tongue status post radiation therapy recently having increasing soreness in the mouth.  Increasing pain localized in both sides of the neck.  Taking oxycodone 30 mg 8 times a day Lungs: Increasing cough shortness of breath yellowish expectoration no fever no hemoptysis GI: Persistent nausea as described above in history of present illness [place] close skeletal system no bony pain Lower extremity no swelling Skin: No rash Abdomen: Had a PEG tube placement  As per HPI. Otherwise, a complete review of systems is negatve.  PAST MEDICAL HISTORY: Carcinoma of tongue Hypertension Hypercholesterolemia Previous substance abuse Gastroesophageal reflux disease  PAST SURGICAL HISTORY: Treated for carcinoma of tongue FAMILY HISTORY There is no significant family history of breast cancer, ovarian cancer, colon cancer    ADVANCED DIRECTIVES:  Patient does not have any living will or healthcare power of attorney.  Information was given .  Available resources had been discussed.  We will follow-up on subsequent appointments regarding this issue  HEALTH MAINTENANCE: Social History  Substance Use Topics  . Smoking status: Former Smoker -- 1.00 packs/day for 0 years    Types: Cigarettes    Quit date: 09/21/2014  . Smokeless tobacco: None  . Alcohol Use: 7.2 oz/week    12 Cans  of beer per week     Comment: beer/wine every day    Quit smoking in November of 2015.  History of smoking for several years in the past.  No Known  Allergies  Current Outpatient Prescriptions  Medication Sig Dispense Refill  . citalopram (CELEXA) 20 MG tablet Take 1 tablet (20 mg total) by mouth daily. 30 tablet 3  . fentaNYL (DURAGESIC - DOSED MCG/HR) 100 MCG/HR Place 1 patch (100 mcg total) onto the skin every 3 (three) days. 10 patch 0  . guaiFENesin (ROBITUSSIN) 100 MG/5ML SOLN Take 5 mLs (100 mg total) by mouth every 4 (four) hours as needed for cough or to loosen phlegm. 1200 mL 0  . morphine (ROXANOL) 20 MG/ML concentrated solution Place 1 mL (20 mg total) into feeding tube every hour as needed for severe pain. 120 mL 0  . Nutritional Supplements (FEEDING SUPPLEMENT, OSMOLITE 1.5 CAL,) LIQD Place 240 mLs into feeding tube 5 (five) times daily. 150 Bottle 3  . omeprazole (PRILOSEC) 20 MG capsule TAKE ONE CAPSULE BY MOUTH TWICE A DAY BEFORE MEALS 60 capsule 0  . ondansetron (ZOFRAN) 4 MG tablet Take 1 tablet (4 mg total) by mouth every 4 (four) hours as needed for nausea or vomiting. 45 tablet 0  . oxycodone (ROXICODONE) 30 MG immediate release tablet TAKE 1 TABLET BY MOUTH UP TO 7 TIMES DAILY AS NEEDED FOR PAIN  0  . phenol (CHLORASEPTIC) 1.4 % LIQD Use as directed 1 spray in the mouth or throat as needed for throat irritation / pain. 177 mL 0  . polyvinyl alcohol (LIQUIFILM TEARS) 1.4 % ophthalmic solution Place 1 drop into both eyes as needed for dry eyes. 15 mL 0  . predniSONE 5 MG/5ML solution Place 20 mLs (20 mg total) into feeding tube daily with breakfast. 100 mL 3  . traZODone (DESYREL) 100 MG tablet Take 100 mg by mouth at bedtime.    . fentaNYL (DURAGESIC - DOSED MCG/HR) 25 MCG/HR patch Place 1 patch (25 mcg total) onto the skin every 3 (three) days. Use with 158mg patch for total dose of 1243m. 10 patch 0   No current facility-administered medications for this visit.   Facility-Administered Medications Ordered in Other Visits  Medication Dose Route Frequency Provider Last Rate Last Dose  . sodium chloride 0.9 % injection  10 mL  10 mL Intracatheter PRN LeEvlyn KannerNP   10 mL at 09/10/15 0948  . sodium chloride 0.9 % injection 10 mL  10 mL Intravenous PRN JaForest GleasonMD   10 mL at 10/24/15 1000    OBJECTIVE: PHYSICAL EXAM: Gen. status: Patient is seen lean and cachectic. Pain has improved. In wheelchair.  Performance status is 3 HEENT: No soreness in the mouth.  But swelling which is diffuse Lymphatic system: Supraclavicular, cervical, axillary, inguinal lymph nodes are not palpable Lungs: Bilateral rhonchi and occasional crepitation. Cardiac: Tachycardia Examination of the skin revealed no evidence of significant rashes, suspicious appearing nevi or other concerning lesions.. Abdominal exam revealed normal bowel sounds. The abdomen was soft, non-tender, and without masses, organomegaly, or appreciable enlargement of the abdominal aorta..   Peg  placement Psychiatric system: Depression and anxiety  Filed Vitals:   03/06/16 1043  BP: 109/72  Pulse: 75  Temp: 96.8 F (36 C)  Resp: 18     Body mass index is 15.16 kg/(m^2).    ECOG FS:3 - Symptomatic, >50% confined to bed  LAB RESULTS:  Clinical Support on 03/06/2016  Component Date  Value Ref Range Status  . WBC 03/06/2016 10.8* 3.8 - 10.6 K/uL Final  . RBC 03/06/2016 4.09* 4.40 - 5.90 MIL/uL Final  . Hemoglobin 03/06/2016 10.5* 13.0 - 18.0 g/dL Final  . HCT 03/06/2016 32.1* 40.0 - 52.0 % Final  . MCV 03/06/2016 78.5* 80.0 - 100.0 fL Final  . MCH 03/06/2016 25.6* 26.0 - 34.0 pg Final  . MCHC 03/06/2016 32.6  32.0 - 36.0 g/dL Final  . RDW 03/06/2016 16.7* 11.5 - 14.5 % Final  . Platelets 03/06/2016 383  150 - 440 K/uL Final  . Neutrophils Relative % 03/06/2016 78   Final  . Neutro Abs 03/06/2016 8.4* 1.4 - 6.5 K/uL Final  . Lymphocytes Relative 03/06/2016 18   Final  . Lymphs Abs 03/06/2016 1.9  1.0 - 3.6 K/uL Final  . Monocytes Relative 03/06/2016 4   Final  . Monocytes Absolute 03/06/2016 0.4  0.2 - 1.0 K/uL Final  . Eosinophils  Relative 03/06/2016 0   Final  . Eosinophils Absolute 03/06/2016 0.0  0 - 0.7 K/uL Final  . Basophils Relative 03/06/2016 0   Final  . Basophils Absolute 03/06/2016 0.0  0 - 0.1 K/uL Final  . Sodium 03/06/2016 133* 135 - 145 mmol/L Final  . Potassium 03/06/2016 4.5  3.5 - 5.1 mmol/L Final  . Chloride 03/06/2016 101  101 - 111 mmol/L Final  . CO2 03/06/2016 25  22 - 32 mmol/L Final  . Glucose, Bld 03/06/2016 117* 65 - 99 mg/dL Final  . BUN 03/06/2016 19  6 - 20 mg/dL Final  . Creatinine, Ser 03/06/2016 0.61  0.61 - 1.24 mg/dL Final  . Calcium 03/06/2016 9.0  8.9 - 10.3 mg/dL Final  . Total Protein 03/06/2016 7.3  6.5 - 8.1 g/dL Final  . Albumin 03/06/2016 3.1* 3.5 - 5.0 g/dL Final  . AST 03/06/2016 21  15 - 41 U/L Final  . ALT 03/06/2016 11* 17 - 63 U/L Final  . Alkaline Phosphatase 03/06/2016 67  38 - 126 U/L Final  . Total Bilirubin 03/06/2016 0.3  0.3 - 1.2 mg/dL Final  . GFR calc non Af Amer 03/06/2016 >60  >60 mL/min Final  . GFR calc Af Amer 03/06/2016 >60  >60 mL/min Final   Comment: (NOTE) The eGFR has been calculated using the CKD EPI equation. This calculation has not been validated in all clinical situations. eGFR's persistently <60 mL/min signify possible Chronic Kidney Disease.   . Anion gap 03/06/2016 7  5 - 15 Final  . Magnesium 03/06/2016 2.0  1.7 - 2.4 mg/dL Final  . T4, Total 03/06/2016 7.0  4.5 - 12.0 ug/dL Final   Comment: (NOTE) Performed At: [facility] [place], [place] [id number] Lindon Romp MD LE:[id number]   . TSH 03/06/2016 1.953  0.350 - 4.500 uIU/mL Final       ASSESSMENT:  A. 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  Carcinoma of tongue in 2006 status post resection and radiation therapy exit staging not known and old records being off pain for  review     PLAN:    Pain is under better control. Patient is starting NIVOLULMAB Informed consent has been obtained Intent of chemotherapy is palliation and relief in symptoms and extending survival  Lab data has been reviewed        No matching staging information  was found for the patient.  Forest Gleason, MD   03/08/2016 9:32 AM

## 2016-03-10 DIAGNOSIS — Z510 Encounter for antineoplastic radiation therapy: Secondary | ICD-10-CM | POA: Diagnosis not present

## 2016-03-10 DIAGNOSIS — G4489 Other headache syndrome: Secondary | ICD-10-CM | POA: Diagnosis not present

## 2016-03-10 DIAGNOSIS — Z87891 Personal history of nicotine dependence: Secondary | ICD-10-CM | POA: Diagnosis not present

## 2016-03-10 DIAGNOSIS — Z9221 Personal history of antineoplastic chemotherapy: Secondary | ICD-10-CM | POA: Diagnosis not present

## 2016-03-10 DIAGNOSIS — J449 Chronic obstructive pulmonary disease, unspecified: Secondary | ICD-10-CM | POA: Diagnosis not present

## 2016-03-10 DIAGNOSIS — R4702 Dysphasia: Secondary | ICD-10-CM | POA: Diagnosis not present

## 2016-03-10 DIAGNOSIS — C01 Malignant neoplasm of base of tongue: Secondary | ICD-10-CM | POA: Diagnosis not present

## 2016-03-12 ENCOUNTER — Ambulatory Visit: Payer: Medicare Other

## 2016-03-12 DIAGNOSIS — G894 Chronic pain syndrome: Secondary | ICD-10-CM | POA: Diagnosis not present

## 2016-03-12 DIAGNOSIS — D649 Anemia, unspecified: Secondary | ICD-10-CM | POA: Diagnosis not present

## 2016-03-12 DIAGNOSIS — C069 Malignant neoplasm of mouth, unspecified: Secondary | ICD-10-CM | POA: Diagnosis not present

## 2016-03-12 DIAGNOSIS — G4489 Other headache syndrome: Secondary | ICD-10-CM | POA: Diagnosis not present

## 2016-03-12 DIAGNOSIS — Z9981 Dependence on supplemental oxygen: Secondary | ICD-10-CM | POA: Diagnosis not present

## 2016-03-12 DIAGNOSIS — Z431 Encounter for attention to gastrostomy: Secondary | ICD-10-CM | POA: Diagnosis not present

## 2016-03-12 DIAGNOSIS — Z87891 Personal history of nicotine dependence: Secondary | ICD-10-CM | POA: Diagnosis not present

## 2016-03-12 DIAGNOSIS — C01 Malignant neoplasm of base of tongue: Secondary | ICD-10-CM | POA: Diagnosis not present

## 2016-03-12 DIAGNOSIS — Z510 Encounter for antineoplastic radiation therapy: Secondary | ICD-10-CM | POA: Diagnosis not present

## 2016-03-12 DIAGNOSIS — R1310 Dysphagia, unspecified: Secondary | ICD-10-CM | POA: Diagnosis not present

## 2016-03-12 DIAGNOSIS — J690 Pneumonitis due to inhalation of food and vomit: Secondary | ICD-10-CM | POA: Diagnosis not present

## 2016-03-12 DIAGNOSIS — K259 Gastric ulcer, unspecified as acute or chronic, without hemorrhage or perforation: Secondary | ICD-10-CM | POA: Diagnosis not present

## 2016-03-12 DIAGNOSIS — K219 Gastro-esophageal reflux disease without esophagitis: Secondary | ICD-10-CM | POA: Diagnosis not present

## 2016-03-12 DIAGNOSIS — Z85818 Personal history of malignant neoplasm of other sites of lip, oral cavity, and pharynx: Secondary | ICD-10-CM | POA: Diagnosis not present

## 2016-03-12 DIAGNOSIS — M1990 Unspecified osteoarthritis, unspecified site: Secondary | ICD-10-CM | POA: Diagnosis not present

## 2016-03-12 DIAGNOSIS — J449 Chronic obstructive pulmonary disease, unspecified: Secondary | ICD-10-CM | POA: Diagnosis not present

## 2016-03-12 DIAGNOSIS — I10 Essential (primary) hypertension: Secondary | ICD-10-CM | POA: Diagnosis not present

## 2016-03-12 DIAGNOSIS — Z9221 Personal history of antineoplastic chemotherapy: Secondary | ICD-10-CM | POA: Diagnosis not present

## 2016-03-12 DIAGNOSIS — E43 Unspecified severe protein-calorie malnutrition: Secondary | ICD-10-CM | POA: Diagnosis not present

## 2016-03-12 DIAGNOSIS — R4702 Dysphasia: Secondary | ICD-10-CM | POA: Diagnosis not present

## 2016-03-13 ENCOUNTER — Other Ambulatory Visit: Payer: Medicare Other

## 2016-03-13 ENCOUNTER — Ambulatory Visit: Payer: Medicare Other | Admitting: Oncology

## 2016-03-13 ENCOUNTER — Ambulatory Visit
Admission: RE | Admit: 2016-03-13 | Discharge: 2016-03-13 | Disposition: A | Discharge status: Home / Self Care | Payer: Medicare Other | Source: Ambulatory Visit | Attending: Radiation Oncology | Admitting: Radiation Oncology

## 2016-03-13 DIAGNOSIS — C028 Malignant neoplasm of overlapping sites of tongue: Secondary | ICD-10-CM | POA: Diagnosis not present

## 2016-03-13 DIAGNOSIS — Z85810 Personal history of malignant neoplasm of tongue: Secondary | ICD-10-CM | POA: Diagnosis not present

## 2016-03-13 DIAGNOSIS — M6281 Muscle weakness (generalized): Secondary | ICD-10-CM | POA: Diagnosis not present

## 2016-03-13 DIAGNOSIS — J690 Pneumonitis due to inhalation of food and vomit: Secondary | ICD-10-CM | POA: Diagnosis not present

## 2016-03-13 DIAGNOSIS — C029 Malignant neoplasm of tongue, unspecified: Secondary | ICD-10-CM | POA: Diagnosis not present

## 2016-03-17 ENCOUNTER — Other Ambulatory Visit: Payer: Self-pay | Admitting: *Deleted

## 2016-03-17 ENCOUNTER — Ambulatory Visit
Admission: RE | Admit: 2016-03-17 | Discharge: 2016-03-17 | Disposition: A | Discharge status: Home / Self Care | Payer: Medicare Other | Source: Ambulatory Visit | Attending: Radiation Oncology | Admitting: Radiation Oncology

## 2016-03-17 DIAGNOSIS — C01 Malignant neoplasm of base of tongue: Secondary | ICD-10-CM | POA: Diagnosis not present

## 2016-03-17 DIAGNOSIS — R4702 Dysphasia: Secondary | ICD-10-CM | POA: Diagnosis not present

## 2016-03-17 DIAGNOSIS — Z87891 Personal history of nicotine dependence: Secondary | ICD-10-CM | POA: Diagnosis not present

## 2016-03-17 DIAGNOSIS — J449 Chronic obstructive pulmonary disease, unspecified: Secondary | ICD-10-CM | POA: Diagnosis not present

## 2016-03-17 DIAGNOSIS — G4489 Other headache syndrome: Secondary | ICD-10-CM | POA: Diagnosis not present

## 2016-03-17 DIAGNOSIS — C029 Malignant neoplasm of tongue, unspecified: Secondary | ICD-10-CM

## 2016-03-17 DIAGNOSIS — Z510 Encounter for antineoplastic radiation therapy: Secondary | ICD-10-CM | POA: Diagnosis not present

## 2016-03-17 DIAGNOSIS — Z9221 Personal history of antineoplastic chemotherapy: Secondary | ICD-10-CM | POA: Diagnosis not present

## 2016-03-17 MED ORDER — MORPHINE SULFATE (CONCENTRATE) 20 MG/ML PO SOLN: 20.0000 mg | ORAL | Status: DC | PRN

## 2016-03-17 MED ORDER — SUCRALFATE 1 G PO TABS: 1.0000 g | ORAL_TABLET | Freq: Three times a day (TID) | ORAL | Status: AC

## 2016-03-18 ENCOUNTER — Ambulatory Visit
Admission: RE | Admit: 2016-03-18 | Discharge: 2016-03-18 | Disposition: A | Discharge status: Home / Self Care | Payer: Medicare Other | Source: Ambulatory Visit | Attending: Radiation Oncology | Admitting: Radiation Oncology

## 2016-03-18 DIAGNOSIS — E43 Unspecified severe protein-calorie malnutrition: Secondary | ICD-10-CM | POA: Diagnosis not present

## 2016-03-18 DIAGNOSIS — J690 Pneumonitis due to inhalation of food and vomit: Secondary | ICD-10-CM | POA: Diagnosis not present

## 2016-03-18 DIAGNOSIS — Z87891 Personal history of nicotine dependence: Secondary | ICD-10-CM | POA: Diagnosis not present

## 2016-03-18 DIAGNOSIS — Z431 Encounter for attention to gastrostomy: Secondary | ICD-10-CM | POA: Diagnosis not present

## 2016-03-18 DIAGNOSIS — C01 Malignant neoplasm of base of tongue: Secondary | ICD-10-CM | POA: Diagnosis not present

## 2016-03-18 DIAGNOSIS — J449 Chronic obstructive pulmonary disease, unspecified: Secondary | ICD-10-CM | POA: Diagnosis not present

## 2016-03-18 DIAGNOSIS — Z510 Encounter for antineoplastic radiation therapy: Secondary | ICD-10-CM | POA: Diagnosis not present

## 2016-03-18 DIAGNOSIS — Z9981 Dependence on supplemental oxygen: Secondary | ICD-10-CM | POA: Diagnosis not present

## 2016-03-18 DIAGNOSIS — M1990 Unspecified osteoarthritis, unspecified site: Secondary | ICD-10-CM | POA: Diagnosis not present

## 2016-03-18 DIAGNOSIS — G894 Chronic pain syndrome: Secondary | ICD-10-CM | POA: Diagnosis not present

## 2016-03-18 DIAGNOSIS — D649 Anemia, unspecified: Secondary | ICD-10-CM | POA: Diagnosis not present

## 2016-03-18 DIAGNOSIS — R1310 Dysphagia, unspecified: Secondary | ICD-10-CM | POA: Diagnosis not present

## 2016-03-18 DIAGNOSIS — R4702 Dysphasia: Secondary | ICD-10-CM | POA: Diagnosis not present

## 2016-03-18 DIAGNOSIS — K259 Gastric ulcer, unspecified as acute or chronic, without hemorrhage or perforation: Secondary | ICD-10-CM | POA: Diagnosis not present

## 2016-03-18 DIAGNOSIS — I10 Essential (primary) hypertension: Secondary | ICD-10-CM | POA: Diagnosis not present

## 2016-03-18 DIAGNOSIS — Z85818 Personal history of malignant neoplasm of other sites of lip, oral cavity, and pharynx: Secondary | ICD-10-CM | POA: Diagnosis not present

## 2016-03-18 DIAGNOSIS — G4489 Other headache syndrome: Secondary | ICD-10-CM | POA: Diagnosis not present

## 2016-03-18 DIAGNOSIS — K219 Gastro-esophageal reflux disease without esophagitis: Secondary | ICD-10-CM | POA: Diagnosis not present

## 2016-03-18 DIAGNOSIS — Z9221 Personal history of antineoplastic chemotherapy: Secondary | ICD-10-CM | POA: Diagnosis not present

## 2016-03-18 DIAGNOSIS — C069 Malignant neoplasm of mouth, unspecified: Secondary | ICD-10-CM | POA: Diagnosis not present

## 2016-03-19 ENCOUNTER — Ambulatory Visit
Admission: RE | Admit: 2016-03-19 | Discharge: 2016-03-19 | Disposition: A | Discharge status: Home / Self Care | Payer: Medicare Other | Source: Ambulatory Visit | Attending: Radiation Oncology | Admitting: Radiation Oncology

## 2016-03-19 DIAGNOSIS — R4702 Dysphasia: Secondary | ICD-10-CM | POA: Diagnosis not present

## 2016-03-19 DIAGNOSIS — Z9221 Personal history of antineoplastic chemotherapy: Secondary | ICD-10-CM | POA: Diagnosis not present

## 2016-03-19 DIAGNOSIS — C01 Malignant neoplasm of base of tongue: Secondary | ICD-10-CM | POA: Diagnosis not present

## 2016-03-19 DIAGNOSIS — J449 Chronic obstructive pulmonary disease, unspecified: Secondary | ICD-10-CM | POA: Diagnosis not present

## 2016-03-19 DIAGNOSIS — Z510 Encounter for antineoplastic radiation therapy: Secondary | ICD-10-CM | POA: Diagnosis not present

## 2016-03-19 DIAGNOSIS — G4489 Other headache syndrome: Secondary | ICD-10-CM | POA: Diagnosis not present

## 2016-03-19 DIAGNOSIS — Z87891 Personal history of nicotine dependence: Secondary | ICD-10-CM | POA: Diagnosis not present

## 2016-03-20 ENCOUNTER — Inpatient Hospital Stay (HOSPITAL_BASED_OUTPATIENT_CLINIC_OR_DEPARTMENT_OTHER): Payer: Medicare Other | Admitting: Oncology

## 2016-03-20 ENCOUNTER — Inpatient Hospital Stay: Payer: Medicare Other

## 2016-03-20 ENCOUNTER — Ambulatory Visit
Admission: RE | Admit: 2016-03-20 | Discharge: 2016-03-20 | Disposition: A | Discharge status: Home / Self Care | Payer: Medicare Other | Source: Ambulatory Visit | Attending: Radiation Oncology | Admitting: Radiation Oncology

## 2016-03-20 ENCOUNTER — Inpatient Hospital Stay: Payer: Medicare Other | Attending: Oncology

## 2016-03-20 ENCOUNTER — Encounter: Payer: Self-pay | Admitting: Oncology

## 2016-03-20 VITALS — BP 132/86 | HR 75 | Temp 96.2°F | Resp 18 | Wt 115.2 lb

## 2016-03-20 DIAGNOSIS — Z79899 Other long term (current) drug therapy: Secondary | ICD-10-CM | POA: Diagnosis not present

## 2016-03-20 DIAGNOSIS — R634 Abnormal weight loss: Secondary | ICD-10-CM

## 2016-03-20 DIAGNOSIS — Z87891 Personal history of nicotine dependence: Secondary | ICD-10-CM | POA: Insufficient documentation

## 2016-03-20 DIAGNOSIS — Z9221 Personal history of antineoplastic chemotherapy: Secondary | ICD-10-CM | POA: Diagnosis not present

## 2016-03-20 DIAGNOSIS — K219 Gastro-esophageal reflux disease without esophagitis: Secondary | ICD-10-CM | POA: Diagnosis not present

## 2016-03-20 DIAGNOSIS — Z923 Personal history of irradiation: Secondary | ICD-10-CM | POA: Diagnosis not present

## 2016-03-20 DIAGNOSIS — Z931 Gastrostomy status: Secondary | ICD-10-CM | POA: Insufficient documentation

## 2016-03-20 DIAGNOSIS — E7800 Pure hypercholesterolemia, unspecified: Secondary | ICD-10-CM | POA: Insufficient documentation

## 2016-03-20 DIAGNOSIS — I10 Essential (primary) hypertension: Secondary | ICD-10-CM | POA: Insufficient documentation

## 2016-03-20 DIAGNOSIS — C029 Malignant neoplasm of tongue, unspecified: Secondary | ICD-10-CM

## 2016-03-20 DIAGNOSIS — R1310 Dysphagia, unspecified: Secondary | ICD-10-CM | POA: Diagnosis not present

## 2016-03-20 DIAGNOSIS — Z510 Encounter for antineoplastic radiation therapy: Secondary | ICD-10-CM | POA: Diagnosis not present

## 2016-03-20 DIAGNOSIS — R4702 Dysphasia: Secondary | ICD-10-CM | POA: Diagnosis not present

## 2016-03-20 DIAGNOSIS — J449 Chronic obstructive pulmonary disease, unspecified: Secondary | ICD-10-CM | POA: Diagnosis not present

## 2016-03-20 DIAGNOSIS — C01 Malignant neoplasm of base of tongue: Secondary | ICD-10-CM | POA: Diagnosis not present

## 2016-03-20 DIAGNOSIS — Z5112 Encounter for antineoplastic immunotherapy: Secondary | ICD-10-CM | POA: Insufficient documentation

## 2016-03-20 DIAGNOSIS — C801 Malignant (primary) neoplasm, unspecified: Secondary | ICD-10-CM

## 2016-03-20 DIAGNOSIS — Z85810 Personal history of malignant neoplasm of tongue: Secondary | ICD-10-CM | POA: Insufficient documentation

## 2016-03-20 DIAGNOSIS — G4489 Other headache syndrome: Secondary | ICD-10-CM | POA: Diagnosis not present

## 2016-03-20 LAB — CBC WITH DIFFERENTIAL/PLATELET
Basophils Absolute: 0.1 10*3/uL (ref 0–0.1)
Basophils Relative: 1 %
EOS ABS: 0.1 10*3/uL (ref 0–0.7)
EOS PCT: 1 %
HCT: 31.9 % — ABNORMAL LOW (ref 40.0–52.0)
HEMOGLOBIN: 10.5 g/dL — AB (ref 13.0–18.0)
LYMPHS ABS: 2.2 10*3/uL (ref 1.0–3.6)
Lymphocytes Relative: 16 %
MCH: 25.6 pg — AB (ref 26.0–34.0)
MCHC: 33 g/dL (ref 32.0–36.0)
MCV: 77.6 fL — ABNORMAL LOW (ref 80.0–100.0)
MONOS PCT: 6 %
Monocytes Absolute: 0.8 10*3/uL (ref 0.2–1.0)
NEUTROS PCT: 76 %
Neutro Abs: 10.2 10*3/uL — ABNORMAL HIGH (ref 1.4–6.5)
PLATELETS: 360 10*3/uL (ref 150–440)
RBC: 4.11 MIL/uL — ABNORMAL LOW (ref 4.40–5.90)
RDW: 16.9 % — ABNORMAL HIGH (ref 11.5–14.5)
WBC: 13.4 10*3/uL — AB (ref 3.8–10.6)

## 2016-03-20 LAB — COMPREHENSIVE METABOLIC PANEL
ALBUMIN: 3.3 g/dL — AB (ref 3.5–5.0)
ALK PHOS: 62 U/L (ref 38–126)
ALT: 6 U/L — ABNORMAL LOW (ref 17–63)
ANION GAP: 10 (ref 5–15)
AST: 16 U/L (ref 15–41)
BUN: 16 mg/dL (ref 6–20)
CALCIUM: 9.1 mg/dL (ref 8.9–10.3)
CO2: 29 mmol/L (ref 22–32)
Chloride: 96 mmol/L — ABNORMAL LOW (ref 101–111)
Creatinine, Ser: 0.54 mg/dL — ABNORMAL LOW (ref 0.61–1.24)
GFR calc non Af Amer: >60 mL/min (ref >60)
GLUCOSE: 97 mg/dL (ref 65–99)
POTASSIUM: 4.2 mmol/L (ref 3.5–5.1)
SODIUM: 135 mmol/L (ref 135–145)
TOTAL PROTEIN: 7.2 g/dL (ref 6.5–8.1)
Total Bilirubin: 0.2 mg/dL — ABNORMAL LOW (ref 0.3–1.2)

## 2016-03-20 MED ORDER — SODIUM CHLORIDE 0.9% FLUSH
10.0000 mL | INTRAVENOUS | Status: DC | PRN
  Administered 2016-03-20: 10 mL
  Filled 2016-03-20: qty 10

## 2016-03-20 MED ORDER — FENTANYL 100 MCG/HR TD PT72: 100.0000 ug | MEDICATED_PATCH | TRANSDERMAL | Status: DC

## 2016-03-20 MED ORDER — HEPARIN SOD (PORK) LOCK FLUSH 100 UNIT/ML IV SOLN
500.0000 [IU] | Freq: Once | INTRAVENOUS | Status: AC | PRN
  Administered 2016-03-20: 500 [IU]
  Filled 2016-03-20: qty 5

## 2016-03-20 MED ORDER — FENTANYL 25 MCG/HR TD PT72
50.0000 ug | MEDICATED_PATCH | TRANSDERMAL | Status: DC
Start: 2016-03-20 — End: 2016-04-03

## 2016-03-20 MED ORDER — SODIUM CHLORIDE 0.9 % IV SOLN
Freq: Once | INTRAVENOUS | Status: AC
  Administered 2016-03-20: 12:00:00 via INTRAVENOUS
  Filled 2016-03-20: qty 1000

## 2016-03-20 MED ORDER — SODIUM CHLORIDE 0.9 % IV SOLN
3.0000 mg/kg | Freq: Once | INTRAVENOUS | Status: AC
  Administered 2016-03-20: 160 mg via INTRAVENOUS
  Filled 2016-03-20: qty 10

## 2016-03-20 NOTE — Progress Notes (Signed)
Patient requesting refill for Fentanyl 100 mcg.

## 2016-03-21 ENCOUNTER — Ambulatory Visit
Admission: RE | Admit: 2016-03-21 | Discharge: 2016-03-21 | Disposition: A | Discharge status: Home / Self Care | Payer: Medicare Other | Source: Ambulatory Visit | Attending: Radiation Oncology | Admitting: Radiation Oncology

## 2016-03-21 ENCOUNTER — Encounter: Payer: Self-pay | Admitting: Oncology

## 2016-03-21 DIAGNOSIS — C01 Malignant neoplasm of base of tongue: Secondary | ICD-10-CM | POA: Diagnosis not present

## 2016-03-21 DIAGNOSIS — R4702 Dysphasia: Secondary | ICD-10-CM | POA: Diagnosis not present

## 2016-03-21 DIAGNOSIS — Z87891 Personal history of nicotine dependence: Secondary | ICD-10-CM | POA: Diagnosis not present

## 2016-03-21 DIAGNOSIS — J449 Chronic obstructive pulmonary disease, unspecified: Secondary | ICD-10-CM | POA: Diagnosis not present

## 2016-03-21 DIAGNOSIS — Z510 Encounter for antineoplastic radiation therapy: Secondary | ICD-10-CM | POA: Diagnosis not present

## 2016-03-21 DIAGNOSIS — G4489 Other headache syndrome: Secondary | ICD-10-CM | POA: Diagnosis not present

## 2016-03-21 DIAGNOSIS — Z9221 Personal history of antineoplastic chemotherapy: Secondary | ICD-10-CM | POA: Diagnosis not present

## 2016-03-21 NOTE — Progress Notes (Signed)
[place] @ [facility] [phone]  Fax:(336) [place] OB: October 21, 1956  MR#: [id number]  CNO#:[id number]  Patient Care Team: Cyndi Bender, PA-C as PCP - General (Physician Assistant) Beverly Gust, MD (Unknown Physician Specialty)  CHIEF COMPLAINT:  Chief Complaint  Patient presents with  . Cancer of tongue   1.  Has a history of cancer of the tongue in 2006.  Patient underwent resection followed by radiation therapy 2.  Increasing difficulty in swallowing for last 2 or 3 weeks.  Patient had upper endoscopy done in December of 2015. 3.  Significant weight loss 4.  Abnormal PET scan (August of 2016) 5.  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  (Cis-platinum and radiation therapy) (September 2 016) T3 N0 M0 tumor Status post PEG tube placement (September, 2016) 6. Poor tolerance dose cis-platinum therapy, so patient has been switched over to cetuximab along with radiation therapy (October, 2016) 7.  Patient has finished cetuximab as well as radiation therapy by 23rd of November 2 016 8.  Patient was recently hospitalized with bilateral pneumonia, aspiration pneumonia and was transferred to rehabilitation (December, 2016) 9.It is going to be started on palliative radiation therapy. 10Patient was started on NIVOLULMAB from April of 2017  VISIT DIAGNOSIS:  Significant weight loss. Difficulty swallowing. History of carcinoma of tongue      INTERVAL HISTORY:   10 old gentleman with a history of carcinoma of base of tongue started chemotherapy with NIVOLULMAB which he tolerated very well.  Also had a fractionated radiation therapy. pain has improved however still rates 5 out of 10.  No nausea no vomiting swallowing is improving.  Appetite is still poor.  Patient is here for ongoing valuation and treatment consideration no diarrhea.  No rash.    REVIEW  OF SYSTEMS:   Gen. status: Patient is feeling weak and tired.  Has lost significant weight Continues to lose weight.  HEENT is improved described as a throbbing pain Recently hospitalized with bilateral pneumonia Poor appetite patient is very depressed.  Losing weight. Increasing difficulty swallowing as mentioned in history of present illness HEENT: As mentioned in history of present illness patient had history of carcinoma of tongue status post radiation therapy recently having increasing soreness in the mouth.  Increasing pain localized in both sides of the neck.  Fentanyl patch 125 g and Roxanol for breakthrough pain Lungs: Increasing cough shortness of breath yellowish expectoration no fever no hemoptysis GI: Persistent nausea as described above in history of present illness [place] close skeletal system no bony pain Lower extremity no swelling Skin: No rash Abdomen: Had a PEG tube placement  As per HPI. Otherwise, a complete review of systems is negatve.  PAST MEDICAL HISTORY: Carcinoma of tongue Hypertension Hypercholesterolemia Previous substance abuse Gastroesophageal reflux disease  PAST SURGICAL HISTORY: Treated for carcinoma of tongue FAMILY HISTORY There is no significant family history of breast cancer, ovarian cancer, colon cancer    ADVANCED DIRECTIVES:  Patient does not have any living will or healthcare power of attorney.  Information was given .  Available resources had been discussed.  We will follow-up on subsequent appointments regarding this issue  HEALTH MAINTENANCE: Social History  Substance Use Topics  . Smoking status: Former Smoker -- 1.00 packs/day for 0 years    Types: Cigarettes    Quit date: 09/21/2014  . Smokeless tobacco: None  . Alcohol Use: 7.2 oz/week  12 Cans of beer per week     Comment: beer/wine every day    Quit smoking in November of 2015.  History of smoking for several years in the past.  No Known Allergies  Current  Outpatient Prescriptions  Medication Sig Dispense Refill  . citalopram (CELEXA) 20 MG tablet Take 1 tablet (20 mg total) by mouth daily. 30 tablet 3  . fentaNYL (DURAGESIC - DOSED MCG/HR) 100 MCG/HR Place 1 patch (100 mcg total) onto the skin every 3 (three) days. 10 patch 0  . fentaNYL (DURAGESIC - DOSED MCG/HR) 25 MCG/HR patch Place 2 patches (50 mcg total) onto the skin every 3 (three) days. Use with 136mg patch for total dose of 1531m.    . Marland KitchenuaiFENesin (ROBITUSSIN) 100 MG/5ML SOLN Take 5 mLs (100 mg total) by mouth every 4 (four) hours as needed for cough or to loosen phlegm. 1200 mL 0  . morphine (ROXANOL) 20 MG/ML concentrated solution Place 1 mL (20 mg total) into feeding tube every hour as needed for severe pain. 120 mL 0  . Nutritional Supplements (FEEDING SUPPLEMENT, OSMOLITE 1.5 CAL,) LIQD Place 240 mLs into feeding tube 5 (five) times daily. 150 Bottle 3  . omeprazole (PRILOSEC) 20 MG capsule TAKE ONE CAPSULE BY MOUTH TWICE A DAY BEFORE MEALS 60 capsule 0  . ondansetron (ZOFRAN) 4 MG tablet Take 1 tablet (4 mg total) by mouth every 4 (four) hours as needed for nausea or vomiting. 45 tablet 0  . phenol (CHLORASEPTIC) 1.4 % LIQD Use as directed 1 spray in the mouth or throat as needed for throat irritation / pain. 177 mL 0  . polyvinyl alcohol (LIQUIFILM TEARS) 1.4 % ophthalmic solution Place 1 drop into both eyes as needed for dry eyes. 15 mL 0  . predniSONE 5 MG/5ML solution Place 20 mLs (20 mg total) into feeding tube daily with breakfast. 100 mL 3  . sucralfate (CARAFATE) 1 g tablet Take 1 tablet (1 g total) by mouth 3 (three) times daily. Dissolve each tablet in 2-3 tbsp warm water, swish and spit. 90 tablet 3  . traZODone (DESYREL) 100 MG tablet Take 100 mg by mouth at bedtime.     No current facility-administered medications for this visit.   Facility-Administered Medications Ordered in Other Visits  Medication Dose Route Frequency Provider Last Rate Last Dose  . sodium  chloride 0.9 % injection 10 mL  10 mL Intracatheter PRN LeEvlyn KannerNP   10 mL at 09/10/15 0948  . sodium chloride 0.9 % injection 10 mL  10 mL Intravenous PRN JaForest GleasonMD   10 mL at 10/24/15 1000    OBJECTIVE: PHYSICAL EXAM: Gen. status: Patient is seen lean and cachectic. Pain has improved. In wheelchair.  Performance status is 3 HEENT: No soreness in the mouth.  But swelling which is diffuse Lymphatic system: Supraclavicular, cervical, axillary, inguinal lymph nodes are not palpable Lungs: Bilateral rhonchi and occasional crepitation. Cardiac: Tachycardia Examination of the skin revealed no evidence of significant rashes, suspicious appearing nevi or other concerning lesions.. Abdominal exam revealed normal bowel sounds. The abdomen was soft, non-tender, and without masses, organomegaly, or appreciable enlargement of the abdominal aorta..   Peg  placement Psychiatric system: Depression and anxiety  Filed Vitals:   03/20/16 1030  BP: 132/86  Pulse: 75  Temp: 96.2 F (35.7 C)  Resp: 18     Body mass index is 14.78 kg/(m^2).    ECOG FS:3 - Symptomatic, >50% confined to bed  LAB  RESULTS:  Infusion on 03/20/2016  Component Date Value Ref Range Status  . WBC 03/20/2016 13.4* 3.8 - 10.6 K/uL Final  . RBC 03/20/2016 4.11* 4.40 - 5.90 MIL/uL Final  . Hemoglobin 03/20/2016 10.5* 13.0 - 18.0 g/dL Final  . HCT 03/20/2016 31.9* 40.0 - 52.0 % Final  . MCV 03/20/2016 77.6* 80.0 - 100.0 fL Final  . MCH 03/20/2016 25.6* 26.0 - 34.0 pg Final  . MCHC 03/20/2016 33.0  32.0 - 36.0 g/dL Final  . RDW 03/20/2016 16.9* 11.5 - 14.5 % Final  . Platelets 03/20/2016 360  150 - 440 K/uL Final  . Neutrophils Relative % 03/20/2016 76   Final  . Neutro Abs 03/20/2016 10.2* 1.4 - 6.5 K/uL Final  . Lymphocytes Relative 03/20/2016 16   Final  . Lymphs Abs 03/20/2016 2.2  1.0 - 3.6 K/uL Final  . Monocytes Relative 03/20/2016 6   Final  . Monocytes Absolute 03/20/2016 0.8  0.2 - 1.0 K/uL Final    . Eosinophils Relative 03/20/2016 1   Final  . Eosinophils Absolute 03/20/2016 0.1  0 - 0.7 K/uL Final  . Basophils Relative 03/20/2016 1   Final  . Basophils Absolute 03/20/2016 0.1  0 - 0.1 K/uL Final  . Sodium 03/20/2016 135  135 - 145 mmol/L Final  . Potassium 03/20/2016 4.2  3.5 - 5.1 mmol/L Final  . Chloride 03/20/2016 96* 101 - 111 mmol/L Final  . CO2 03/20/2016 29  22 - 32 mmol/L Final  . Glucose, Bld 03/20/2016 97  65 - 99 mg/dL Final  . BUN 03/20/2016 16  6 - 20 mg/dL Final  . Creatinine, Ser 03/20/2016 0.54* 0.61 - 1.24 mg/dL Final  . Calcium 03/20/2016 9.1  8.9 - 10.3 mg/dL Final  . Total Protein 03/20/2016 7.2  6.5 - 8.1 g/dL Final  . Albumin 03/20/2016 3.3* 3.5 - 5.0 g/dL Final  . AST 03/20/2016 16  15 - 41 U/L Final  . ALT 03/20/2016 6* 17 - 63 U/L Final  . Alkaline Phosphatase 03/20/2016 62  38 - 126 U/L Final  . Total Bilirubin 03/20/2016 0.2* 0.3 - 1.2 mg/dL Final  . GFR calc non Af Amer 03/20/2016 >60  >60 mL/min Final  . GFR calc Af Amer 03/20/2016 >60  >60 mL/min Final   Comment: (NOTE) The eGFR has been calculated using the CKD EPI equation. This calculation has not been validated in all clinical situations. eGFR's persistently <60 mL/min signify possible Chronic Kidney Disease.   . Anion gap 03/20/2016 10  5 - 15 Final       ASSESSMENT:  A. 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  Carcinoma of tongue in 2006 status post resection and radiation therapy exit staging not known and old records being off pain for review  Recurrence and progressing disease.  We will continue patient on NIVOLULMAB  Regarding pain he would be started on increased dose of fentanyl patch 150 g and continue Roxanol for breakthrough pain.   Continue chemotherapy and radiation therapy  Lab data has been  reviewed        No matching staging information was found for the patient.  Forest Gleason, MD   03/21/2016 11:28 AM

## 2016-03-24 ENCOUNTER — Ambulatory Visit
Admission: RE | Admit: 2016-03-24 | Discharge: 2016-03-24 | Disposition: A | Discharge status: Home / Self Care | Payer: Medicare Other | Source: Ambulatory Visit | Attending: Radiation Oncology | Admitting: Radiation Oncology

## 2016-03-24 DIAGNOSIS — Z510 Encounter for antineoplastic radiation therapy: Secondary | ICD-10-CM | POA: Diagnosis not present

## 2016-03-24 DIAGNOSIS — G4489 Other headache syndrome: Secondary | ICD-10-CM | POA: Diagnosis not present

## 2016-03-24 DIAGNOSIS — J449 Chronic obstructive pulmonary disease, unspecified: Secondary | ICD-10-CM | POA: Diagnosis not present

## 2016-03-24 DIAGNOSIS — Z87891 Personal history of nicotine dependence: Secondary | ICD-10-CM | POA: Diagnosis not present

## 2016-03-24 DIAGNOSIS — R4702 Dysphasia: Secondary | ICD-10-CM | POA: Diagnosis not present

## 2016-03-24 DIAGNOSIS — C01 Malignant neoplasm of base of tongue: Secondary | ICD-10-CM | POA: Diagnosis not present

## 2016-03-24 DIAGNOSIS — Z9221 Personal history of antineoplastic chemotherapy: Secondary | ICD-10-CM | POA: Diagnosis not present

## 2016-03-25 ENCOUNTER — Other Ambulatory Visit: Payer: Self-pay | Admitting: Oncology

## 2016-03-25 ENCOUNTER — Telehealth: Payer: Self-pay | Admitting: *Deleted

## 2016-03-25 ENCOUNTER — Ambulatory Visit
Admission: RE | Admit: 2016-03-25 | Discharge: 2016-03-25 | Disposition: A | Discharge status: Home / Self Care | Payer: Medicare Other | Source: Ambulatory Visit | Attending: Radiation Oncology | Admitting: Radiation Oncology

## 2016-03-25 ENCOUNTER — Ambulatory Visit: Payer: Medicare Other

## 2016-03-25 ENCOUNTER — Other Ambulatory Visit: Payer: Self-pay | Admitting: Family Medicine

## 2016-03-25 DIAGNOSIS — Z9981 Dependence on supplemental oxygen: Secondary | ICD-10-CM | POA: Diagnosis not present

## 2016-03-25 DIAGNOSIS — K219 Gastro-esophageal reflux disease without esophagitis: Secondary | ICD-10-CM | POA: Diagnosis not present

## 2016-03-25 DIAGNOSIS — D649 Anemia, unspecified: Secondary | ICD-10-CM | POA: Diagnosis not present

## 2016-03-25 DIAGNOSIS — E43 Unspecified severe protein-calorie malnutrition: Secondary | ICD-10-CM | POA: Diagnosis not present

## 2016-03-25 DIAGNOSIS — R1310 Dysphagia, unspecified: Secondary | ICD-10-CM | POA: Diagnosis not present

## 2016-03-25 DIAGNOSIS — I10 Essential (primary) hypertension: Secondary | ICD-10-CM | POA: Diagnosis not present

## 2016-03-25 DIAGNOSIS — G894 Chronic pain syndrome: Secondary | ICD-10-CM | POA: Diagnosis not present

## 2016-03-25 DIAGNOSIS — J690 Pneumonitis due to inhalation of food and vomit: Secondary | ICD-10-CM | POA: Diagnosis not present

## 2016-03-25 DIAGNOSIS — Z85818 Personal history of malignant neoplasm of other sites of lip, oral cavity, and pharynx: Secondary | ICD-10-CM | POA: Diagnosis not present

## 2016-03-25 DIAGNOSIS — M1990 Unspecified osteoarthritis, unspecified site: Secondary | ICD-10-CM | POA: Diagnosis not present

## 2016-03-25 DIAGNOSIS — Z431 Encounter for attention to gastrostomy: Secondary | ICD-10-CM | POA: Diagnosis not present

## 2016-03-25 DIAGNOSIS — K259 Gastric ulcer, unspecified as acute or chronic, without hemorrhage or perforation: Secondary | ICD-10-CM | POA: Diagnosis not present

## 2016-03-25 DIAGNOSIS — C069 Malignant neoplasm of mouth, unspecified: Secondary | ICD-10-CM | POA: Diagnosis not present

## 2016-03-25 MED ORDER — AMOXICILLIN-POT CLAVULANATE 875-125 MG PO TABS: 1.0000 | ORAL_TABLET | Freq: Two times a day (BID) | ORAL | Status: DC

## 2016-03-25 NOTE — Telephone Encounter (Signed)
Called to let them know that an antibiotic was sent to pharmacy for his fever

## 2016-03-26 ENCOUNTER — Other Ambulatory Visit: Payer: Self-pay | Admitting: *Deleted

## 2016-03-26 ENCOUNTER — Ambulatory Visit
Admission: RE | Admit: 2016-03-26 | Discharge: 2016-03-26 | Disposition: A | Discharge status: Home / Self Care | Payer: Medicare Other | Source: Ambulatory Visit | Attending: Radiation Oncology | Admitting: Radiation Oncology

## 2016-03-26 ENCOUNTER — Ambulatory Visit: Payer: Medicare Other

## 2016-03-26 DIAGNOSIS — C029 Malignant neoplasm of tongue, unspecified: Secondary | ICD-10-CM

## 2016-03-26 MED ORDER — DEXAMETHASONE 4 MG PO TABS: 4.0000 mg | ORAL_TABLET | Freq: Two times a day (BID) | ORAL | Status: DC

## 2016-03-26 MED ORDER — MORPHINE SULFATE (CONCENTRATE) 20 MG/ML PO SOLN: 20.0000 mg | ORAL | Status: DC | PRN

## 2016-03-27 ENCOUNTER — Ambulatory Visit: Payer: Medicare Other

## 2016-03-27 ENCOUNTER — Ambulatory Visit
Admission: RE | Admit: 2016-03-27 | Discharge: 2016-03-27 | Disposition: A | Discharge status: Home / Self Care | Payer: Medicare Other | Source: Ambulatory Visit | Attending: Radiation Oncology | Admitting: Radiation Oncology

## 2016-03-28 ENCOUNTER — Ambulatory Visit: Payer: Medicare Other

## 2016-03-28 ENCOUNTER — Ambulatory Visit
Admission: RE | Admit: 2016-03-28 | Discharge: 2016-03-28 | Disposition: A | Discharge status: Home / Self Care | Payer: Medicare Other | Source: Ambulatory Visit | Attending: Radiation Oncology | Admitting: Radiation Oncology

## 2016-03-28 DIAGNOSIS — R1310 Dysphagia, unspecified: Secondary | ICD-10-CM | POA: Diagnosis not present

## 2016-03-28 DIAGNOSIS — M6281 Muscle weakness (generalized): Secondary | ICD-10-CM | POA: Diagnosis not present

## 2016-03-31 ENCOUNTER — Ambulatory Visit
Admission: RE | Admit: 2016-03-31 | Discharge: 2016-03-31 | Disposition: A | Discharge status: Home / Self Care | Payer: Medicare Other | Source: Ambulatory Visit | Attending: Radiation Oncology | Admitting: Radiation Oncology

## 2016-03-31 DIAGNOSIS — G4489 Other headache syndrome: Secondary | ICD-10-CM | POA: Diagnosis not present

## 2016-03-31 DIAGNOSIS — Z9221 Personal history of antineoplastic chemotherapy: Secondary | ICD-10-CM | POA: Diagnosis not present

## 2016-03-31 DIAGNOSIS — Z87891 Personal history of nicotine dependence: Secondary | ICD-10-CM | POA: Diagnosis not present

## 2016-03-31 DIAGNOSIS — C01 Malignant neoplasm of base of tongue: Secondary | ICD-10-CM | POA: Diagnosis not present

## 2016-03-31 DIAGNOSIS — J449 Chronic obstructive pulmonary disease, unspecified: Secondary | ICD-10-CM | POA: Diagnosis not present

## 2016-03-31 DIAGNOSIS — Z510 Encounter for antineoplastic radiation therapy: Secondary | ICD-10-CM | POA: Diagnosis not present

## 2016-03-31 DIAGNOSIS — R4702 Dysphasia: Secondary | ICD-10-CM | POA: Diagnosis not present

## 2016-04-01 ENCOUNTER — Ambulatory Visit
Admission: RE | Admit: 2016-04-01 | Discharge: 2016-04-01 | Disposition: A | Discharge status: Home / Self Care | Payer: Medicare Other | Source: Ambulatory Visit | Attending: Radiation Oncology | Admitting: Radiation Oncology

## 2016-04-01 DIAGNOSIS — J449 Chronic obstructive pulmonary disease, unspecified: Secondary | ICD-10-CM | POA: Diagnosis not present

## 2016-04-01 DIAGNOSIS — Z510 Encounter for antineoplastic radiation therapy: Secondary | ICD-10-CM | POA: Diagnosis not present

## 2016-04-01 DIAGNOSIS — C01 Malignant neoplasm of base of tongue: Secondary | ICD-10-CM | POA: Diagnosis not present

## 2016-04-01 DIAGNOSIS — R4702 Dysphasia: Secondary | ICD-10-CM | POA: Diagnosis not present

## 2016-04-01 DIAGNOSIS — G4489 Other headache syndrome: Secondary | ICD-10-CM | POA: Diagnosis not present

## 2016-04-01 DIAGNOSIS — Z9221 Personal history of antineoplastic chemotherapy: Secondary | ICD-10-CM | POA: Diagnosis not present

## 2016-04-01 DIAGNOSIS — Z87891 Personal history of nicotine dependence: Secondary | ICD-10-CM | POA: Diagnosis not present

## 2016-04-02 ENCOUNTER — Ambulatory Visit
Admission: RE | Admit: 2016-04-02 | Discharge: 2016-04-02 | Disposition: A | Discharge status: Home / Self Care | Payer: Medicare Other | Source: Ambulatory Visit | Attending: Radiation Oncology | Admitting: Radiation Oncology

## 2016-04-02 DIAGNOSIS — C01 Malignant neoplasm of base of tongue: Secondary | ICD-10-CM | POA: Diagnosis not present

## 2016-04-02 DIAGNOSIS — Z510 Encounter for antineoplastic radiation therapy: Secondary | ICD-10-CM | POA: Diagnosis not present

## 2016-04-02 DIAGNOSIS — R4702 Dysphasia: Secondary | ICD-10-CM | POA: Diagnosis not present

## 2016-04-02 DIAGNOSIS — G4489 Other headache syndrome: Secondary | ICD-10-CM | POA: Diagnosis not present

## 2016-04-02 DIAGNOSIS — J449 Chronic obstructive pulmonary disease, unspecified: Secondary | ICD-10-CM | POA: Diagnosis not present

## 2016-04-02 DIAGNOSIS — Z87891 Personal history of nicotine dependence: Secondary | ICD-10-CM | POA: Diagnosis not present

## 2016-04-02 DIAGNOSIS — K9423 Gastrostomy malfunction: Secondary | ICD-10-CM | POA: Diagnosis not present

## 2016-04-02 DIAGNOSIS — Z9221 Personal history of antineoplastic chemotherapy: Secondary | ICD-10-CM | POA: Diagnosis not present

## 2016-04-03 ENCOUNTER — Encounter: Payer: Self-pay | Admitting: Oncology

## 2016-04-03 ENCOUNTER — Inpatient Hospital Stay: Payer: Medicare Other

## 2016-04-03 ENCOUNTER — Inpatient Hospital Stay (HOSPITAL_BASED_OUTPATIENT_CLINIC_OR_DEPARTMENT_OTHER): Payer: Medicare Other | Admitting: Oncology

## 2016-04-03 ENCOUNTER — Ambulatory Visit
Admission: RE | Admit: 2016-04-03 | Discharge: 2016-04-03 | Disposition: A | Discharge status: Home / Self Care | Payer: Medicare Other | Source: Ambulatory Visit | Attending: Radiation Oncology | Admitting: Radiation Oncology

## 2016-04-03 VITALS — BP 132/81 | HR 70 | Temp 95.6°F | Resp 18 | Wt 110.6 lb

## 2016-04-03 DIAGNOSIS — Z923 Personal history of irradiation: Secondary | ICD-10-CM | POA: Diagnosis not present

## 2016-04-03 DIAGNOSIS — E7800 Pure hypercholesterolemia, unspecified: Secondary | ICD-10-CM | POA: Diagnosis not present

## 2016-04-03 DIAGNOSIS — R634 Abnormal weight loss: Secondary | ICD-10-CM

## 2016-04-03 DIAGNOSIS — Z85810 Personal history of malignant neoplasm of tongue: Secondary | ICD-10-CM | POA: Diagnosis not present

## 2016-04-03 DIAGNOSIS — C01 Malignant neoplasm of base of tongue: Secondary | ICD-10-CM | POA: Diagnosis not present

## 2016-04-03 DIAGNOSIS — Z9221 Personal history of antineoplastic chemotherapy: Secondary | ICD-10-CM | POA: Diagnosis not present

## 2016-04-03 DIAGNOSIS — Z79899 Other long term (current) drug therapy: Secondary | ICD-10-CM | POA: Diagnosis not present

## 2016-04-03 DIAGNOSIS — R1310 Dysphagia, unspecified: Secondary | ICD-10-CM

## 2016-04-03 DIAGNOSIS — G4489 Other headache syndrome: Secondary | ICD-10-CM | POA: Diagnosis not present

## 2016-04-03 DIAGNOSIS — R4702 Dysphasia: Secondary | ICD-10-CM | POA: Diagnosis not present

## 2016-04-03 DIAGNOSIS — J449 Chronic obstructive pulmonary disease, unspecified: Secondary | ICD-10-CM | POA: Diagnosis not present

## 2016-04-03 DIAGNOSIS — Z931 Gastrostomy status: Secondary | ICD-10-CM | POA: Diagnosis not present

## 2016-04-03 DIAGNOSIS — K219 Gastro-esophageal reflux disease without esophagitis: Secondary | ICD-10-CM | POA: Diagnosis not present

## 2016-04-03 DIAGNOSIS — Z510 Encounter for antineoplastic radiation therapy: Secondary | ICD-10-CM | POA: Diagnosis not present

## 2016-04-03 DIAGNOSIS — C029 Malignant neoplasm of tongue, unspecified: Secondary | ICD-10-CM

## 2016-04-03 DIAGNOSIS — Z87891 Personal history of nicotine dependence: Secondary | ICD-10-CM

## 2016-04-03 DIAGNOSIS — I10 Essential (primary) hypertension: Secondary | ICD-10-CM | POA: Diagnosis not present

## 2016-04-03 DIAGNOSIS — Z5112 Encounter for antineoplastic immunotherapy: Secondary | ICD-10-CM | POA: Diagnosis not present

## 2016-04-03 LAB — CBC WITH DIFFERENTIAL/PLATELET
BASOS PCT: 0 %
Basophils Absolute: 0 10*3/uL (ref 0–0.1)
EOS ABS: 0 10*3/uL (ref 0–0.7)
Eosinophils Relative: 0 %
HCT: 34.3 % — ABNORMAL LOW (ref 40.0–52.0)
HEMOGLOBIN: 11.5 g/dL — AB (ref 13.0–18.0)
LYMPHS ABS: 2.5 10*3/uL (ref 1.0–3.6)
Lymphocytes Relative: 25 %
MCH: 26.3 pg (ref 26.0–34.0)
MCHC: 33.6 g/dL (ref 32.0–36.0)
MCV: 78.3 fL — ABNORMAL LOW (ref 80.0–100.0)
Monocytes Absolute: 0.8 10*3/uL (ref 0.2–1.0)
Monocytes Relative: 8 %
NEUTROS PCT: 67 %
Neutro Abs: 6.7 10*3/uL — ABNORMAL HIGH (ref 1.4–6.5)
Platelets: 476 10*3/uL — ABNORMAL HIGH (ref 150–440)
RBC: 4.38 MIL/uL — AB (ref 4.40–5.90)
RDW: 17.4 % — ABNORMAL HIGH (ref 11.5–14.5)
WBC: 10.1 10*3/uL (ref 3.8–10.6)

## 2016-04-03 LAB — COMPREHENSIVE METABOLIC PANEL
ALBUMIN: 3.3 g/dL — AB (ref 3.5–5.0)
ALK PHOS: 60 U/L (ref 38–126)
ALT: 13 U/L — AB (ref 17–63)
AST: 20 U/L (ref 15–41)
Anion gap: 8 (ref 5–15)
BUN: 24 mg/dL — AB (ref 6–20)
CALCIUM: 9.2 mg/dL (ref 8.9–10.3)
CO2: 26 mmol/L (ref 22–32)
CREATININE: 0.59 mg/dL — AB (ref 0.61–1.24)
Chloride: 100 mmol/L — ABNORMAL LOW (ref 101–111)
GFR calc Af Amer: >60 mL/min (ref >60)
GFR calc non Af Amer: >60 mL/min (ref >60)
GLUCOSE: 107 mg/dL — AB (ref 65–99)
Potassium: 4.4 mmol/L (ref 3.5–5.1)
SODIUM: 134 mmol/L — AB (ref 135–145)
Total Bilirubin: 0.4 mg/dL (ref 0.3–1.2)
Total Protein: 7 g/dL (ref 6.5–8.1)

## 2016-04-03 MED ORDER — SODIUM CHLORIDE 0.9 % IV SOLN
3.0000 mg/kg | Freq: Once | INTRAVENOUS | Status: AC
  Administered 2016-04-03: 160 mg via INTRAVENOUS
  Filled 2016-04-03: qty 16

## 2016-04-03 MED ORDER — FENTANYL 25 MCG/HR TD PT72: 25.0000 ug | MEDICATED_PATCH | TRANSDERMAL | Status: DC

## 2016-04-03 MED ORDER — SODIUM CHLORIDE 0.9 % IV SOLN
Freq: Once | INTRAVENOUS | Status: AC
  Administered 2016-04-03: 11:00:00 via INTRAVENOUS
  Filled 2016-04-03: qty 1000

## 2016-04-03 MED ORDER — SODIUM CHLORIDE 0.9% FLUSH
10.0000 mL | Freq: Once | INTRAVENOUS | Status: AC
  Administered 2016-04-03: 10 mL via INTRAVENOUS
  Filled 2016-04-03: qty 10

## 2016-04-03 MED ORDER — HEPARIN SOD (PORK) LOCK FLUSH 100 UNIT/ML IV SOLN
500.0000 [IU] | Freq: Once | INTRAVENOUS | Status: AC
  Administered 2016-04-03: 500 [IU] via INTRAVENOUS

## 2016-04-03 MED ORDER — MORPHINE SULFATE (CONCENTRATE) 20 MG/ML PO SOLN: 20.0000 mg | ORAL | Status: DC | PRN

## 2016-04-03 NOTE — Progress Notes (Signed)
Patient requesting refill for Fentanyl 25 and Morphine.

## 2016-04-04 ENCOUNTER — Ambulatory Visit
Admission: RE | Admit: 2016-04-04 | Discharge: 2016-04-04 | Disposition: A | Discharge status: Home / Self Care | Payer: Medicare Other | Source: Ambulatory Visit | Attending: Radiation Oncology | Admitting: Radiation Oncology

## 2016-04-04 DIAGNOSIS — Z510 Encounter for antineoplastic radiation therapy: Secondary | ICD-10-CM | POA: Diagnosis not present

## 2016-04-04 DIAGNOSIS — Z87891 Personal history of nicotine dependence: Secondary | ICD-10-CM | POA: Diagnosis not present

## 2016-04-04 DIAGNOSIS — C01 Malignant neoplasm of base of tongue: Secondary | ICD-10-CM | POA: Diagnosis not present

## 2016-04-04 DIAGNOSIS — G4489 Other headache syndrome: Secondary | ICD-10-CM | POA: Diagnosis not present

## 2016-04-04 DIAGNOSIS — Z9221 Personal history of antineoplastic chemotherapy: Secondary | ICD-10-CM | POA: Diagnosis not present

## 2016-04-04 DIAGNOSIS — R4702 Dysphasia: Secondary | ICD-10-CM | POA: Diagnosis not present

## 2016-04-04 DIAGNOSIS — J449 Chronic obstructive pulmonary disease, unspecified: Secondary | ICD-10-CM | POA: Diagnosis not present

## 2016-04-05 ENCOUNTER — Encounter: Payer: Self-pay | Admitting: Oncology

## 2016-04-05 NOTE — Progress Notes (Signed)
[place] @ [facility] [phone]) [phone]  Fax:(336) [place] OB: Aug 10, 1956  MR#: [id number]  KDT#:[id number]  Patient Care Team: Cyndi Bender, PA-C as PCP - General (Physician Assistant) Beverly Gust, MD (Unknown Physician Specialty)  CHIEF COMPLAINT:  Chief Complaint  Patient presents with  . Cancer of tongue   1.  Has a history of cancer of the tongue in 2006.  Patient underwent resection followed by radiation therapy 2.  Increasing difficulty in swallowing for last 2 or 3 weeks.  Patient had upper endoscopy done in December of 2015. 3.  Significant weight loss 4.  Abnormal PET scan (August of 2016) 5.  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  (Cis-platinum and radiation therapy) (September 2 016) T3 N0 M0 tumor Status post PEG tube placement (September, 2016) 6. Poor tolerance dose cis-platinum therapy, so patient has been switched over to cetuximab along with radiation therapy (October, 2016) 7.  Patient has finished cetuximab as well as radiation therapy by 23rd of November 2 016 8.  Patient was recently hospitalized with bilateral pneumonia, aspiration pneumonia and was transferred to rehabilitation (December, 2016) 9.It is going to be started on palliative radiation therapy.(Radiation would finished in now May of 2017 10Patient was started on NIVOLULMAB from April of 2017  VISIT DIAGNOSIS:  Significant weight loss. Difficulty swallowing. History of carcinoma of tongue      INTERVAL HISTORY:   81 old gentleman with a history of carcinoma of base of tongue started chemotherapy with NIVOLULMAB which he tolerated very well.  Also had a fractionated radiation therapy. pain has improved however still rates 5 out of 10.  No nausea no vomiting swallowing is improving.  Appetite is still poor.  Patient is here for ongoing valuation and treatment  consideration no diarrhea.  No rash.  Combination of fentanyl and Roxanol is controlling pain.  Tolerating NIVOLULMAB.  No nausea no vomiting or diarrhea appetite has been stable or significant weight.  Patient will finish 3 radiation therapy.   REVIEW OF SYSTEMS:   Gen. status: Patient is feeling weak and tired.  Has lost significant weight Continues to lose weight.  HEENT is improved described as a throbbing pain Recently hospitalized with bilateral pneumonia Poor appetite patient is very depressed.  Losing weight. Increasing difficulty swallowing as mentioned in history of present illness HEENT: As mentioned in history of present illness patient had history of carcinoma of tongue status post radiation therapy recently having increasing soreness in the mouth.  Increasing pain localized in both sides of the neck.  Fentanyl patch 125 g and Roxanol for breakthrough pain Lungs: Increasing cough shortness of breath yellowish expectoration no fever no hemoptysis GI: Persistent nausea as described above in history of present illness [place] close skeletal system no bony pain Lower extremity no swelling Skin: No rash Abdomen: Had a PEG tube placement  As per HPI. Otherwise, a complete review of systems is negatve.  PAST MEDICAL HISTORY: Carcinoma of tongue Hypertension Hypercholesterolemia Previous substance abuse Gastroesophageal reflux disease  PAST SURGICAL HISTORY: Treated for carcinoma of tongue FAMILY HISTORY There is no significant family history of breast cancer, ovarian cancer, colon cancer    ADVANCED DIRECTIVES:  Patient does not have any living will or healthcare power of attorney.  Information was given .  Available resources had been discussed.  We will follow-up on subsequent appointments regarding this issue  HEALTH MAINTENANCE: Social History  Substance  Use Topics  . Smoking status: Former Smoker -- 1.00 packs/day for 0 years    Types: Cigarettes    Quit date:  09/21/2014  . Smokeless tobacco: None  . Alcohol Use: 7.2 oz/week    12 Cans of beer per week     Comment: beer/wine every day    Quit smoking in November of 2015.  History of smoking for several years in the past.  No Known Allergies  Current Outpatient Prescriptions  Medication Sig Dispense Refill  . amoxicillin-clavulanate (AUGMENTIN) 875-125 MG tablet Take 1 tablet by mouth 2 (two) times daily. 20 tablet 0  . citalopram (CELEXA) 20 MG tablet Take 1 tablet (20 mg total) by mouth daily. 30 tablet 3  . dexamethasone (DECADRON) 4 MG tablet Take 1 tablet (4 mg total) by mouth 2 (two) times daily. x7 days, then 1 tab daily x7 days, then 1 tab every other day until finished 25 tablet 0  . fentaNYL (DURAGESIC - DOSED MCG/HR) 100 MCG/HR Place 1 patch (100 mcg total) onto the skin every 3 (three) days. 10 patch 0  . fentaNYL (DURAGESIC - DOSED MCG/HR) 25 MCG/HR patch Place 1 patch (25 mcg total) onto the skin every 3 (three) days. Use with 126mg patch for total dose of 1250m. 10 patch 0  . guaiFENesin (ROBITUSSIN) 100 MG/5ML SOLN Take 5 mLs (100 mg total) by mouth every 4 (four) hours as needed for cough or to loosen phlegm. 1200 mL 0  . morphine (ROXANOL) 20 MG/ML concentrated solution Place 1 mL (20 mg total) into feeding tube every hour as needed for severe pain. 240 mL 0  . Nutritional Supplements (FEEDING SUPPLEMENT, OSMOLITE 1.5 CAL,) LIQD Place 240 mLs into feeding tube 5 (five) times daily. 150 Bottle 3  . omeprazole (PRILOSEC) 20 MG capsule TAKE ONE CAPSULE BY MOUTH TWICE A DAY BEFORE MEALS 60 capsule 0  . ondansetron (ZOFRAN) 4 MG tablet Take 1 tablet (4 mg total) by mouth every 4 (four) hours as needed for nausea or vomiting. 45 tablet 0  . Oxycodone HCl 20 MG TABS     . phenol (CHLORASEPTIC) 1.4 % LIQD Use as directed 1 spray in the mouth or throat as needed for throat irritation / pain. 177 mL 0  . polyvinyl alcohol (LIQUIFILM TEARS) 1.4 % ophthalmic solution Place 1 drop into both  eyes as needed for dry eyes. 15 mL 0  . predniSONE 5 MG/5ML solution Place 20 mLs (20 mg total) into feeding tube daily with breakfast. 100 mL 3  . sucralfate (CARAFATE) 1 g tablet Take 1 tablet (1 g total) by mouth 3 (three) times daily. Dissolve each tablet in 2-3 tbsp warm water, swish and spit. 90 tablet 3  . traZODone (DESYREL) 100 MG tablet Take 100 mg by mouth at bedtime.     No current facility-administered medications for this visit.   Facility-Administered Medications Ordered in Other Visits  Medication Dose Route Frequency Provider Last Rate Last Dose  . sodium chloride 0.9 % injection 10 mL  10 mL Intracatheter PRN LeEvlyn KannerNP   10 mL at 09/10/15 0948  . sodium chloride 0.9 % injection 10 mL  10 mL Intravenous PRN JaForest GleasonMD   10 mL at 10/24/15 1000    OBJECTIVE: PHYSICAL EXAM: Gen. status: Patient is seen lean and cachectic. Pain has improved. In wheelchair.  Performance status is 3 HEENT: No soreness in the mouth.  But swelling which is diffuse Lymphatic system: Supraclavicular, cervical, axillary, inguinal  lymph nodes are not palpable Lungs: Bilateral rhonchi and occasional crepitation. Cardiac: Tachycardia Examination of the skin revealed no evidence of significant rashes, suspicious appearing nevi or other concerning lesions.. Abdominal exam revealed normal bowel sounds. The abdomen was soft, non-tender, and without masses, organomegaly, or appreciable enlargement of the abdominal aorta..   Peg  placement Psychiatric system: Depression and anxiety  Filed Vitals:   04/03/16 1006  BP: 132/81  Pulse: 70  Temp: 95.6 F (35.3 C)  Resp: 18     Body mass index is 14.19 kg/(m^2).    ECOG FS:3 - Symptomatic, >50% confined to bed  LAB RESULTS:  Infusion on 04/03/2016  Component Date Value Ref Range Status  . WBC 04/03/2016 10.1  3.8 - 10.6 K/uL Final  . RBC 04/03/2016 4.38* 4.40 - 5.90 MIL/uL Final  . Hemoglobin 04/03/2016 11.5* 13.0 - 18.0 g/dL Final    . HCT 04/03/2016 34.3* 40.0 - 52.0 % Final  . MCV 04/03/2016 78.3* 80.0 - 100.0 fL Final  . MCH 04/03/2016 26.3  26.0 - 34.0 pg Final  . MCHC 04/03/2016 33.6  32.0 - 36.0 g/dL Final  . RDW 04/03/2016 17.4* 11.5 - 14.5 % Final  . Platelets 04/03/2016 476* 150 - 440 K/uL Final  . Neutrophils Relative % 04/03/2016 67   Final  . Neutro Abs 04/03/2016 6.7* 1.4 - 6.5 K/uL Final  . Lymphocytes Relative 04/03/2016 25   Final  . Lymphs Abs 04/03/2016 2.5  1.0 - 3.6 K/uL Final  . Monocytes Relative 04/03/2016 8   Final  . Monocytes Absolute 04/03/2016 0.8  0.2 - 1.0 K/uL Final  . Eosinophils Relative 04/03/2016 0   Final  . Eosinophils Absolute 04/03/2016 0.0  0 - 0.7 K/uL Final  . Basophils Relative 04/03/2016 0   Final  . Basophils Absolute 04/03/2016 0.0  0 - 0.1 K/uL Final  . Sodium 04/03/2016 134* 135 - 145 mmol/L Final  . Potassium 04/03/2016 4.4  3.5 - 5.1 mmol/L Final  . Chloride 04/03/2016 100* 101 - 111 mmol/L Final  . CO2 04/03/2016 26  22 - 32 mmol/L Final  . Glucose, Bld 04/03/2016 107* 65 - 99 mg/dL Final  . BUN 04/03/2016 24* 6 - 20 mg/dL Final  . Creatinine, Ser 04/03/2016 0.59* 0.61 - 1.24 mg/dL Final  . Calcium 04/03/2016 9.2  8.9 - 10.3 mg/dL Final  . Total Protein 04/03/2016 7.0  6.5 - 8.1 g/dL Final  . Albumin 04/03/2016 3.3* 3.5 - 5.0 g/dL Final  . AST 04/03/2016 20  15 - 41 U/L Final  . ALT 04/03/2016 13* 17 - 63 U/L Final  . Alkaline Phosphatase 04/03/2016 60  38 - 126 U/L Final  . Total Bilirubin 04/03/2016 0.4  0.3 - 1.2 mg/dL Final  . GFR calc non Af Amer 04/03/2016 >60  >60 mL/min Final  . GFR calc Af Amer 04/03/2016 >60  >60 mL/min Final   Comment: (NOTE) The eGFR has been calculated using the CKD EPI equation. This calculation has not been validated in all clinical situations. eGFR's persistently <60 mL/min signify possible Chronic Kidney Disease.   . Anion gap 04/03/2016 8  5 - 15 Final       ASSESSMENT:  A. second primary or the carcinoma of tongue  squamous cell on the left side extending all the way to epiglottis and base of the tongue patient is starting radiation and chemotherapy (September, 2016) 1.  Carcinoma of tongue in 2006 status post resection and radiation therapy exit staging not known  and old records being off pain for review  Recurrence and progressing disease.  We will continue patient on NIVOLULMAB  Regarding pain he would be started on increased dose of fentanyl patch 150 g and continue Roxanol for breakthrough pain.   Continue chemotherapy and radiation therapy he will finish the radiation therapy in end of May of 2017. Continue NIVOLULMAB today and in 2 weeks patient was instructed to call us if has any diarrhea. Thyroid functions will be evaluated Pain is under better control with combination of fentanyl and Roxanol (patient has significant tolerance to narcotic medication because of patient had been on narcotic medication for chronic back pain in the past) Repeat PET scan has been scheduled after 2 more months of NIVOLULMAB.  Patient is aware of my retirement planned.   Lab data has been reviewed        No matching staging information was found for the patient.  Forest Gleason, MD   04/05/2016 8:34 AM

## 2016-04-08 ENCOUNTER — Ambulatory Visit
Admission: RE | Admit: 2016-04-08 | Discharge: 2016-04-08 | Disposition: A | Discharge status: Home / Self Care | Payer: Medicare Other | Source: Ambulatory Visit | Attending: Radiation Oncology | Admitting: Radiation Oncology

## 2016-04-08 ENCOUNTER — Ambulatory Visit: Payer: Medicare Other

## 2016-04-08 ENCOUNTER — Telehealth: Payer: Self-pay | Admitting: *Deleted

## 2016-04-08 DIAGNOSIS — R1310 Dysphagia, unspecified: Secondary | ICD-10-CM | POA: Diagnosis not present

## 2016-04-08 DIAGNOSIS — G4489 Other headache syndrome: Secondary | ICD-10-CM | POA: Diagnosis not present

## 2016-04-08 DIAGNOSIS — G894 Chronic pain syndrome: Secondary | ICD-10-CM | POA: Diagnosis not present

## 2016-04-08 DIAGNOSIS — E43 Unspecified severe protein-calorie malnutrition: Secondary | ICD-10-CM | POA: Diagnosis not present

## 2016-04-08 DIAGNOSIS — K219 Gastro-esophageal reflux disease without esophagitis: Secondary | ICD-10-CM | POA: Diagnosis not present

## 2016-04-08 DIAGNOSIS — I10 Essential (primary) hypertension: Secondary | ICD-10-CM | POA: Diagnosis not present

## 2016-04-08 DIAGNOSIS — D649 Anemia, unspecified: Secondary | ICD-10-CM | POA: Diagnosis not present

## 2016-04-08 DIAGNOSIS — J449 Chronic obstructive pulmonary disease, unspecified: Secondary | ICD-10-CM | POA: Diagnosis not present

## 2016-04-08 DIAGNOSIS — C069 Malignant neoplasm of mouth, unspecified: Secondary | ICD-10-CM | POA: Diagnosis not present

## 2016-04-08 DIAGNOSIS — Z87891 Personal history of nicotine dependence: Secondary | ICD-10-CM | POA: Diagnosis not present

## 2016-04-08 DIAGNOSIS — R4702 Dysphasia: Secondary | ICD-10-CM | POA: Diagnosis not present

## 2016-04-08 DIAGNOSIS — Z9221 Personal history of antineoplastic chemotherapy: Secondary | ICD-10-CM | POA: Diagnosis not present

## 2016-04-08 DIAGNOSIS — Z9981 Dependence on supplemental oxygen: Secondary | ICD-10-CM | POA: Diagnosis not present

## 2016-04-08 DIAGNOSIS — K259 Gastric ulcer, unspecified as acute or chronic, without hemorrhage or perforation: Secondary | ICD-10-CM | POA: Diagnosis not present

## 2016-04-08 DIAGNOSIS — Z431 Encounter for attention to gastrostomy: Secondary | ICD-10-CM | POA: Diagnosis not present

## 2016-04-08 DIAGNOSIS — Z85818 Personal history of malignant neoplasm of other sites of lip, oral cavity, and pharynx: Secondary | ICD-10-CM | POA: Diagnosis not present

## 2016-04-08 DIAGNOSIS — C01 Malignant neoplasm of base of tongue: Secondary | ICD-10-CM | POA: Diagnosis not present

## 2016-04-08 DIAGNOSIS — J690 Pneumonitis due to inhalation of food and vomit: Secondary | ICD-10-CM | POA: Diagnosis not present

## 2016-04-08 DIAGNOSIS — Z510 Encounter for antineoplastic radiation therapy: Secondary | ICD-10-CM | POA: Diagnosis not present

## 2016-04-08 DIAGNOSIS — M1990 Unspecified osteoarthritis, unspecified site: Secondary | ICD-10-CM | POA: Diagnosis not present

## 2016-04-08 NOTE — Telephone Encounter (Signed)
Called to request an order for intermittent nursing visits to check on him regarding his lungs and pneumonia

## 2016-04-09 ENCOUNTER — Ambulatory Visit
Admission: RE | Admit: 2016-04-09 | Discharge: 2016-04-09 | Disposition: A | Discharge status: Home / Self Care | Payer: Medicare Other | Source: Ambulatory Visit | Attending: Radiation Oncology | Admitting: Radiation Oncology

## 2016-04-09 ENCOUNTER — Ambulatory Visit: Payer: Medicare Other

## 2016-04-09 DIAGNOSIS — Z87891 Personal history of nicotine dependence: Secondary | ICD-10-CM | POA: Diagnosis not present

## 2016-04-09 DIAGNOSIS — Z9221 Personal history of antineoplastic chemotherapy: Secondary | ICD-10-CM | POA: Diagnosis not present

## 2016-04-09 DIAGNOSIS — R4702 Dysphasia: Secondary | ICD-10-CM | POA: Diagnosis not present

## 2016-04-09 DIAGNOSIS — Z510 Encounter for antineoplastic radiation therapy: Secondary | ICD-10-CM | POA: Diagnosis not present

## 2016-04-09 DIAGNOSIS — G4489 Other headache syndrome: Secondary | ICD-10-CM | POA: Diagnosis not present

## 2016-04-09 DIAGNOSIS — C01 Malignant neoplasm of base of tongue: Secondary | ICD-10-CM | POA: Diagnosis not present

## 2016-04-09 DIAGNOSIS — J449 Chronic obstructive pulmonary disease, unspecified: Secondary | ICD-10-CM | POA: Diagnosis not present

## 2016-04-09 NOTE — Telephone Encounter (Signed)
Per Dr Dow Adolph to order nursing, Judeen Hammans infomred

## 2016-04-10 ENCOUNTER — Ambulatory Visit
Admission: RE | Admit: 2016-04-10 | Discharge: 2016-04-10 | Disposition: A | Discharge status: Home / Self Care | Payer: Medicare Other | Source: Ambulatory Visit | Attending: Radiation Oncology | Admitting: Radiation Oncology

## 2016-04-10 DIAGNOSIS — Z87891 Personal history of nicotine dependence: Secondary | ICD-10-CM | POA: Diagnosis not present

## 2016-04-10 DIAGNOSIS — Z510 Encounter for antineoplastic radiation therapy: Secondary | ICD-10-CM | POA: Diagnosis not present

## 2016-04-10 DIAGNOSIS — C01 Malignant neoplasm of base of tongue: Secondary | ICD-10-CM | POA: Diagnosis not present

## 2016-04-10 DIAGNOSIS — Z9221 Personal history of antineoplastic chemotherapy: Secondary | ICD-10-CM | POA: Diagnosis not present

## 2016-04-10 DIAGNOSIS — R4702 Dysphasia: Secondary | ICD-10-CM | POA: Diagnosis not present

## 2016-04-10 DIAGNOSIS — G4489 Other headache syndrome: Secondary | ICD-10-CM | POA: Diagnosis not present

## 2016-04-10 DIAGNOSIS — J449 Chronic obstructive pulmonary disease, unspecified: Secondary | ICD-10-CM | POA: Diagnosis not present

## 2016-04-11 ENCOUNTER — Ambulatory Visit: Payer: Medicare Other

## 2016-04-11 ENCOUNTER — Ambulatory Visit
Admission: RE | Admit: 2016-04-11 | Discharge: 2016-04-11 | Disposition: A | Discharge status: Home / Self Care | Payer: Medicare Other | Source: Ambulatory Visit | Attending: Radiation Oncology | Admitting: Radiation Oncology

## 2016-04-11 DIAGNOSIS — R4702 Dysphasia: Secondary | ICD-10-CM | POA: Diagnosis not present

## 2016-04-11 DIAGNOSIS — J449 Chronic obstructive pulmonary disease, unspecified: Secondary | ICD-10-CM | POA: Diagnosis not present

## 2016-04-11 DIAGNOSIS — Z9221 Personal history of antineoplastic chemotherapy: Secondary | ICD-10-CM | POA: Diagnosis not present

## 2016-04-11 DIAGNOSIS — Z87891 Personal history of nicotine dependence: Secondary | ICD-10-CM | POA: Diagnosis not present

## 2016-04-11 DIAGNOSIS — Z510 Encounter for antineoplastic radiation therapy: Secondary | ICD-10-CM | POA: Diagnosis not present

## 2016-04-11 DIAGNOSIS — C01 Malignant neoplasm of base of tongue: Secondary | ICD-10-CM | POA: Diagnosis not present

## 2016-04-11 DIAGNOSIS — G4489 Other headache syndrome: Secondary | ICD-10-CM | POA: Diagnosis not present

## 2016-04-15 DIAGNOSIS — C028 Malignant neoplasm of overlapping sites of tongue: Secondary | ICD-10-CM | POA: Diagnosis not present

## 2016-04-15 DIAGNOSIS — E43 Unspecified severe protein-calorie malnutrition: Secondary | ICD-10-CM | POA: Diagnosis not present

## 2016-04-15 DIAGNOSIS — Z9981 Dependence on supplemental oxygen: Secondary | ICD-10-CM | POA: Diagnosis not present

## 2016-04-15 DIAGNOSIS — Z85810 Personal history of malignant neoplasm of tongue: Secondary | ICD-10-CM | POA: Diagnosis not present

## 2016-04-15 DIAGNOSIS — M1990 Unspecified osteoarthritis, unspecified site: Secondary | ICD-10-CM | POA: Diagnosis not present

## 2016-04-15 DIAGNOSIS — R1310 Dysphagia, unspecified: Secondary | ICD-10-CM | POA: Diagnosis not present

## 2016-04-15 DIAGNOSIS — Z431 Encounter for attention to gastrostomy: Secondary | ICD-10-CM | POA: Diagnosis not present

## 2016-04-15 DIAGNOSIS — C029 Malignant neoplasm of tongue, unspecified: Secondary | ICD-10-CM | POA: Diagnosis not present

## 2016-04-15 DIAGNOSIS — M6281 Muscle weakness (generalized): Secondary | ICD-10-CM | POA: Diagnosis not present

## 2016-04-15 DIAGNOSIS — D649 Anemia, unspecified: Secondary | ICD-10-CM | POA: Diagnosis not present

## 2016-04-15 DIAGNOSIS — K259 Gastric ulcer, unspecified as acute or chronic, without hemorrhage or perforation: Secondary | ICD-10-CM | POA: Diagnosis not present

## 2016-04-15 DIAGNOSIS — I10 Essential (primary) hypertension: Secondary | ICD-10-CM | POA: Diagnosis not present

## 2016-04-15 DIAGNOSIS — C069 Malignant neoplasm of mouth, unspecified: Secondary | ICD-10-CM | POA: Diagnosis not present

## 2016-04-15 DIAGNOSIS — Z85818 Personal history of malignant neoplasm of other sites of lip, oral cavity, and pharynx: Secondary | ICD-10-CM | POA: Diagnosis not present

## 2016-04-15 DIAGNOSIS — K219 Gastro-esophageal reflux disease without esophagitis: Secondary | ICD-10-CM | POA: Diagnosis not present

## 2016-04-15 DIAGNOSIS — J690 Pneumonitis due to inhalation of food and vomit: Secondary | ICD-10-CM | POA: Diagnosis not present

## 2016-04-15 DIAGNOSIS — G894 Chronic pain syndrome: Secondary | ICD-10-CM | POA: Diagnosis not present

## 2016-04-17 ENCOUNTER — Emergency Department
Admission: EM | Admit: 2016-04-17 | Discharge: 2016-04-17 | Disposition: A | Discharge status: Home / Self Care | Payer: Medicare Other | Attending: Emergency Medicine | Admitting: Emergency Medicine

## 2016-04-17 ENCOUNTER — Inpatient Hospital Stay: Payer: Medicare Other

## 2016-04-17 ENCOUNTER — Inpatient Hospital Stay: Payer: Medicare Other | Attending: Oncology

## 2016-04-17 ENCOUNTER — Emergency Department: Payer: Medicare Other

## 2016-04-17 VITALS — BP 101/65 | HR 68 | Temp 96.5°F

## 2016-04-17 DIAGNOSIS — E860 Dehydration: Secondary | ICD-10-CM | POA: Diagnosis not present

## 2016-04-17 DIAGNOSIS — Z923 Personal history of irradiation: Secondary | ICD-10-CM | POA: Diagnosis not present

## 2016-04-17 DIAGNOSIS — I10 Essential (primary) hypertension: Secondary | ICD-10-CM | POA: Insufficient documentation

## 2016-04-17 DIAGNOSIS — M1990 Unspecified osteoarthritis, unspecified site: Secondary | ICD-10-CM | POA: Insufficient documentation

## 2016-04-17 DIAGNOSIS — K9423 Gastrostomy malfunction: Secondary | ICD-10-CM

## 2016-04-17 DIAGNOSIS — Z85818 Personal history of malignant neoplasm of other sites of lip, oral cavity, and pharynx: Secondary | ICD-10-CM | POA: Diagnosis not present

## 2016-04-17 DIAGNOSIS — Z931 Gastrostomy status: Secondary | ICD-10-CM | POA: Insufficient documentation

## 2016-04-17 DIAGNOSIS — C01 Malignant neoplasm of base of tongue: Secondary | ICD-10-CM | POA: Insufficient documentation

## 2016-04-17 DIAGNOSIS — Z8711 Personal history of peptic ulcer disease: Secondary | ICD-10-CM | POA: Diagnosis not present

## 2016-04-17 DIAGNOSIS — Z87891 Personal history of nicotine dependence: Secondary | ICD-10-CM | POA: Diagnosis not present

## 2016-04-17 DIAGNOSIS — I959 Hypotension, unspecified: Secondary | ICD-10-CM | POA: Insufficient documentation

## 2016-04-17 DIAGNOSIS — K9429 Other complications of gastrostomy: Secondary | ICD-10-CM | POA: Diagnosis not present

## 2016-04-17 DIAGNOSIS — K219 Gastro-esophageal reflux disease without esophagitis: Secondary | ICD-10-CM | POA: Diagnosis not present

## 2016-04-17 DIAGNOSIS — Z452 Encounter for adjustment and management of vascular access device: Secondary | ICD-10-CM | POA: Diagnosis not present

## 2016-04-17 DIAGNOSIS — Z5111 Encounter for antineoplastic chemotherapy: Secondary | ICD-10-CM | POA: Insufficient documentation

## 2016-04-17 DIAGNOSIS — Z79899 Other long term (current) drug therapy: Secondary | ICD-10-CM | POA: Insufficient documentation

## 2016-04-17 DIAGNOSIS — C029 Malignant neoplasm of tongue, unspecified: Secondary | ICD-10-CM

## 2016-04-17 LAB — CBC WITH DIFFERENTIAL/PLATELET
Basophils Absolute: 0 10*3/uL (ref 0–0.1)
Basophils Relative: 0 %
Eosinophils Absolute: 0.1 10*3/uL (ref 0–0.7)
Eosinophils Relative: 1 %
HEMATOCRIT: 31 % — AB (ref 40.0–52.0)
HEMOGLOBIN: 10.2 g/dL — AB (ref 13.0–18.0)
LYMPHS ABS: 3.3 10*3/uL (ref 1.0–3.6)
LYMPHS PCT: 35 %
MCH: 26.4 pg (ref 26.0–34.0)
MCHC: 33 g/dL (ref 32.0–36.0)
MCV: 79.8 fL — ABNORMAL LOW (ref 80.0–100.0)
MONOS PCT: 8 %
Monocytes Absolute: 0.8 10*3/uL (ref 0.2–1.0)
NEUTROS ABS: 5.2 10*3/uL (ref 1.4–6.5)
NEUTROS PCT: 56 %
Platelets: 280 10*3/uL (ref 150–440)
RBC: 3.89 MIL/uL — ABNORMAL LOW (ref 4.40–5.90)
RDW: 20.4 % — ABNORMAL HIGH (ref 11.5–14.5)
WBC: 9.4 10*3/uL (ref 3.8–10.6)

## 2016-04-17 LAB — COMPREHENSIVE METABOLIC PANEL
ALK PHOS: 65 U/L (ref 38–126)
ALT: 15 U/L — ABNORMAL LOW (ref 17–63)
ANION GAP: 5 (ref 5–15)
AST: 17 U/L (ref 15–41)
Albumin: 2.9 g/dL — ABNORMAL LOW (ref 3.5–5.0)
BILIRUBIN TOTAL: 0.3 mg/dL (ref 0.3–1.2)
BUN: 20 mg/dL (ref 6–20)
CALCIUM: 8.5 mg/dL — AB (ref 8.9–10.3)
CO2: 29 mmol/L (ref 22–32)
CREATININE: 0.53 mg/dL — AB (ref 0.61–1.24)
Chloride: 101 mmol/L (ref 101–111)
GFR calc non Af Amer: >60 mL/min (ref >60)
GLUCOSE: 88 mg/dL (ref 65–99)
Potassium: 4.1 mmol/L (ref 3.5–5.1)
Sodium: 135 mmol/L (ref 135–145)
TOTAL PROTEIN: 6.2 g/dL — AB (ref 6.5–8.1)

## 2016-04-17 LAB — TSH: TSH: 5.803 u[IU]/mL — ABNORMAL HIGH (ref 0.350–4.500)

## 2016-04-17 MED ORDER — SODIUM CHLORIDE 0.9 % IV SOLN
Freq: Once | INTRAVENOUS | Status: AC
  Administered 2016-04-17: 10:00:00 via INTRAVENOUS
  Filled 2016-04-17: qty 1000

## 2016-04-17 MED ORDER — SODIUM CHLORIDE 0.9 % IV SOLN
3.0000 mg/kg | Freq: Once | INTRAVENOUS | Status: AC
  Administered 2016-04-17: 160 mg via INTRAVENOUS
  Filled 2016-04-17: qty 16

## 2016-04-17 MED ORDER — SODIUM CHLORIDE 0.9% FLUSH
10.0000 mL | INTRAVENOUS | Status: DC | PRN
  Filled 2016-04-17: qty 10

## 2016-04-17 MED ORDER — HEPARIN SOD (PORK) LOCK FLUSH 100 UNIT/ML IV SOLN
500.0000 [IU] | Freq: Once | INTRAVENOUS | Status: AC | PRN
  Administered 2016-04-17: 500 [IU]
  Filled 2016-04-17: qty 5

## 2016-04-17 MED ORDER — SODIUM CHLORIDE 0.9% FLUSH
10.0000 mL | INTRAVENOUS | Status: AC | PRN
  Administered 2016-04-17: 10 mL via INTRAVENOUS
  Filled 2016-04-17: qty 10

## 2016-04-17 NOTE — ED Provider Notes (Signed)
[facility] Emergency Department Provider Note  ____________________________________________  Time seen: Approximately 3:43 PM  I have reviewed the triage vital signs and the nursing notes.   HISTORY  Chief Complaint gastric tube out    HPI Grant Lee is a 60 y.o. 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  He states he just got his arm caught up on the tube and yanked it out.  He complains of acute onset of mild pain at the site that is an aching, nothing makes better and nothing makes worse.  Denies fever/chills, CP, SOB, N/V/D, dysuria.    Review of the records indicates that the patient's current G-tube is a 74 [place] and was placed by Dr. Candace Cruise about 7 months ago.  I asked the patient if this sounds correct or if he has had another procedure since that time and he believes that this is correct.  He is currently undergoing chemotherapy by Dr. Rogue Bussing.   Past Medical History  Diagnosis Date  . Hypertension   . Pneumonia     2004  . GERD (gastroesophageal reflux disease)   . Arthritis   . Anemia     blood transfusion 2012  . Bleeding ulcer   . Machinery accident     LEG INJURY  . Cancer ([place])     mouth 2006 then again 2016  . Protein-calorie malnutrition, severe ([place]) 07/05/2015  . Chronic pain syndrome 08/08/2015  . PUD (peptic ulcer disease) 08/08/2015    Patient Active Problem List   Diagnosis Date Noted  . Aspiration pneumonia ([place]) 02/08/2016  . Dysphagia 02/08/2016  . 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 Chronic pain 02/08/2016  . Hyponatremia 02/08/2016  . Pneumonia 10/02/2015  . Malignant neoplasm of tongue ([place]) 08/30/2015  . PUD (peptic ulcer disease) 08/08/2015  . Acute esophagitis 08/08/2015  . Chronic pain syndrome 08/08/2015  . Esophagitis 08/08/2015  . Chemotherapy  induced nausea and vomiting 08/03/2015  . N&V (nausea and vomiting) 08/03/2015  . Cancer of tongue ([place])   . Protein-calorie malnutrition, severe ([place]) 07/05/2015  . Severe protein-calorie malnutrition [employer]ame[employer]: less than 60% of standard weight) ([place]) 07/05/2015  . Malnutrition ([place]) 07/03/2015  . Aspiration pneumonitis ([place]) 07/03/2015  . Pneumonitis due to inhalation of food or vomitus ([place]) 07/03/2015  . DU (duodenal ulcer) 06/11/2015  . Tongue cancer ([place]) 06/11/2015  . Duodenal ulcer without hemorrhage or perforation 06/11/2015  . Abnormal loss of weight 10/09/2014    Past Surgical History  Procedure Laterality Date  . Leg surgery    . Joint replacement      right hip  . Tongue biopsy    . Tongue surgery      multiple surgeries  . Direct laryngoscopy N/A 06/26/2015    Procedure: Laryngoscopy with tongue base biopsy ;  Surgeon: Beverly Gust, MD;  Location: [facility] ORS;  Service: ENT;  Laterality: N/A;  . Esophagogastroduodenoscopy N/A 07/06/2015    Procedure: ESOPHAGOGASTRODUODENOSCOPY (EGD) and percutaneous gastrostomy tyube placement;  Surgeon: Lollie Sails, MD;  Location: [facility] ENDOSCOPY;  Service: Endoscopy;  Laterality: N/A;  Residual need to be done in the afternoon on 07/06/2015. Combined procedure with Dr. Gustavo Lah and Dr Rayann Heman  . Peripheral vascular catheterization N/A 07/27/2015    Procedure: Glori Luis Cath Insertion;  Surgeon: Katha Cabal, MD;  Location: [facility] CV LAB;  Service: Cardiovascular;  Laterality: N/A;  . Esophagogastroduodenoscopy (egd) with propofol N/A 08/07/2015    Procedure: ESOPHAGOGASTRODUODENOSCOPY (EGD) WITH PROPOFOL;  Surgeon: Lollie Sails, MD;  Location: [facility] ENDOSCOPY;  Service: Endoscopy;  Laterality: N/A;  . Peg placement N/A 10/08/2015    Procedure: PERCUTANEOUS ENDOSCOPIC GASTROSTOMY (PEG) PLACEMENT;  Surgeon: Manya Silvas, MD;  Location: [facility] ENDOSCOPY;  Service: Endoscopy;  Laterality: N/A;    Current Outpatient Rx  Name   Route  Sig  Dispense  Refill  . amoxicillin-clavulanate (AUGMENTIN) 875-125 MG tablet   Oral   Take 1 tablet by mouth 2 (two) times daily.   20 tablet   0   . citalopram (CELEXA) 20 MG tablet   Oral   Take 1 tablet (20 mg total) by mouth daily.   30 tablet   3   . dexamethasone (DECADRON) 4 MG tablet   Oral   Take 1 tablet (4 mg total) by mouth 2 (two) times daily. x7 days, then 1 tab daily x7 days, then 1 tab every other day until finished   25 tablet   0   . fentaNYL (DURAGESIC - DOSED MCG/HR) 100 MCG/HR   Transdermal   Place 1 patch (100 mcg total) onto the skin every 3 (three) days.   10 patch   0   . fentaNYL (DURAGESIC - DOSED MCG/HR) 25 MCG/HR patch   Transdermal   Place 1 patch (25 mcg total) onto the skin every 3 (three) days. Use with 127mg patch for total dose of 1224m.   10 patch   0   . guaiFENesin (ROBITUSSIN) 100 MG/5ML SOLN   Oral   Take 5 mLs (100 mg total) by mouth every 4 (four) hours as needed for cough or to loosen phlegm.   1200 mL   0   . morphine (ROXANOL) 20 MG/ML concentrated solution   Per Tube   Place 1 mL (20 mg total) into feeding tube every hour as needed for severe pain.   240 mL   0   . Nutritional Supplements (FEEDING SUPPLEMENT, OSMOLITE 1.5 CAL,) LIQD   Per Tube   Place 240 mLs into feeding tube 5 (five) times daily.   150 Bottle   3   . omeprazole (PRILOSEC) 20 MG capsule      TAKE ONE CAPSULE BY MOUTH TWICE A DAY BEFORE MEALS   60 capsule   0   . ondansetron (ZOFRAN) 4 MG tablet   Oral   Take 1 tablet (4 mg total) by mouth every 4 (four) hours as needed for nausea or vomiting.   45 tablet   0   . Oxycodone HCl 20 MG TABS               . phenol (CHLORASEPTIC) 1.4 % LIQD   Mouth/Throat   Use as directed 1 spray in the mouth or throat as needed for throat irritation / pain.   177 mL   0   . polyvinyl alcohol (LIQUIFILM TEARS) 1.4 % ophthalmic solution   Both Eyes   Place 1 drop into both eyes as needed for  dry eyes.   15 mL   0   . predniSONE 5 MG/5ML solution   Per Tube   Place 20 mLs (20 mg total) into feeding tube daily with breakfast.   100 mL   3   . sucralfate (CARAFATE) 1 g tablet   Oral   Take 1 tablet (1 g total) by mouth 3 (three) times daily.  Dissolve each tablet in 2-3 tbsp warm water, swish and spit.   90 tablet   3   . traZODone (DESYREL) 100 MG tablet   Oral   Take 100 mg by mouth at bedtime.           Allergies Review of patient's allergies indicates no known allergies.  Family History  Problem Relation Age of Onset  . CAD Mother   . CAD Father     Social History Social History  Substance Use Topics  . Smoking status: Former Smoker -- 1.00 packs/day for 0 years    Types: Cigarettes    Quit date: 09/21/2014  . Smokeless tobacco: None  . Alcohol Use: 7.2 oz/week    12 Cans of beer per week     Comment: beer/wine every day    Review of Systems Constitutional: No fever/chills Eyes: No visual changes. ENT: No sore throat. Cardiovascular: Denies chest pain. Respiratory: Denies shortness of breath. Gastrointestinal: Mild pain at (previous) site of G-tube.  No nausea, no vomiting.  No diarrhea.  No constipation. Genitourinary: Negative for dysuria. Musculoskeletal: Negative for back pain. Skin: Negative for rash. Neurological: Negative for headaches, focal weakness or numbness.  10-point ROS otherwise negative.  ____________________________________________   PHYSICAL EXAM:  ED Triage Vitals  Enc Vitals Group     BP 04/17/16 1542 112/68 mmHg     Pulse Rate 04/17/16 1542 63     Resp 04/17/16 1542 20     Temp 04/17/16 1542 97.7 F (36.5 C)     Temp Source 04/17/16 1542 Oral     SpO2 04/17/16 1542 98 %     Weight 04/17/16 1542 117 lb (53.071 kg)     Height 04/17/16 1542 '6\' 2"'$  (1.88 m)     Head Cir --      Peak Flow --      Pain Score 04/17/16 1544 0     Pain Loc --      Pain Edu? --      Excl. in [place]? --     Constitutional: Alert and  oriented. Well appearing and in no acute distress. Eyes: Conjunctivae are normal. PERRL. EOMI. Head: Atraumatic. Nose: No congestion/rhinnorhea. Mouth/Throat: Mucous membranes are moist.  Oropharynx non-erythematous. Neck: No stridor.  No meningeal signs.   Cardiovascular: Normal rate, regular rhythm. Good peripheral circulation. Grossly normal heart sounds.   Respiratory: Normal respiratory effort.  No retractions. Lungs CTAB. Gastrointestinal: Soft and nontender. No distention. Well-appearing gastrostomy, no surrounding cellulitis. Musculoskeletal: No lower extremity tenderness nor edema. No gross deformities of extremities. Neurologic:  Normal speech and language. No gross focal neurologic deficits are appreciated.  Skin:  Skin is warm, dry and intact. No rash noted. Psychiatric: Mood and affect are normal. Speech and behavior are normal.  ____________________________________________   LABS (all labs ordered are listed, but only abnormal results are displayed)  Labs Reviewed - No data to display ____________________________________________  EKG  None ____________________________________________  RADIOLOGY   Dg Abd 1 View  04/17/2016  CLINICAL DATA:  Gastrostomy catheter replacement. EXAM: ABDOMEN - 1 VIEW COMPARISON:  None. FINDINGS: Gastrografin was administered via a previously placed gastrostomy catheter. The gastrostomy catheter is positioned in the stomach. No contrast extravasation. Bowel gas pattern unremarkable. Atelectatic changes noted in the right lung base. IMPRESSION: Gastrostomy catheter positioned in stomach. No contrast extravasation. Bowel gas pattern unremarkable. Electronically Signed   By: Lowella Grip III M.D.   On: 04/17/2016 16:47    ____________________________________________   PROCEDURES  Procedure(s)  performed:   Gastrostomy tube replacement Date/Time: 04/17/2016 4:15 PM Performed by: Hinda Kehr Authorized by: Hinda Kehr Consent:  Verbal consent obtained. Risks and benefits: risks, benefits and alternatives were discussed Consent given by: patient Patient understanding: patient states understanding of the procedure being performed Imaging studies: imaging studies available Required items: required blood products, implants, devices, and special equipment available Patient identity confirmed: verbally with patient and arm band Time out: Immediately prior to procedure a "time out" was called to verify the correct patient, procedure, equipment, support staff and site/side marked as required. Local anesthesia used: no Patient sedated: no Patient tolerance: Patient tolerated the procedure well with no immediate complications     Critical Care performed: No ____________________________________________   INITIAL IMPRESSION / ASSESSMENT AND PLAN / ED COURSE  Pertinent labs & imaging results that were available during my care of the patient were reviewed by me and considered in my medical decision making (see chart for details).  Replaced gastric tube as described in procedure note above.  Patient is in no distress and has no pain or tenderness after the procedure.  Confirmed position with radiograph.  I gave my usual and customary return precautions.      ____________________________________________  FINAL CLINICAL IMPRESSION(S) / ED DIAGNOSES  Final diagnoses:  PEG tube malfunction ([place])  Status post insertion of percutaneous endoscopic gastrostomy (PEG) tube ([place])     MEDICATIONS GIVEN DURING THIS VISIT:  Medications - No data to display   NEW OUTPATIENT MEDICATIONS STARTED DURING THIS VISIT:  New Prescriptions   No medications on file      Note:  This document was prepared using Dragon voice recognition software and may include unintentional dictation errors.   Hinda Kehr, MD 04/17/16 [phone]

## 2016-04-17 NOTE — ED Notes (Signed)
MD at bedside. 

## 2016-04-17 NOTE — ED Notes (Signed)
Pt arrives to ER via [facility] from home c/o gtube being pulled out on accident. Pt alert and oriented X4, active, cooperative, pt in NAD. RR even and unlabored, color WNL.

## 2016-04-17 NOTE — ED Notes (Signed)
No bleeding from site, color WNL.

## 2016-04-17 NOTE — ED Notes (Signed)
Pt alert and oriented X4, active, cooperative, pt in NAD. RR even and unlabored, color WNL.  Pt informed to return if any life threatening symptoms occur.   

## 2016-04-17 NOTE — Discharge Instructions (Signed)
After your gastric tube was accidentally removed at home, we were able to replace it with the same size (20 Fr).  We verified the placement with an x-ray.  Please follow up with your regular doctor at the next available opportunity.   Gastrostomy Tube Replacement, Care After Refer to this sheet in the next few weeks. These instructions provide you with information on caring for yourself after your procedure. Your health care provider may also give you more specific instructions. Your treatment has been planned according to current medical practices, but problems sometimes occur. Call your health care provider if you have any problems or questions after your procedure. WHAT TO EXPECT AFTER THE PROCEDURE  After your procedure, it is typical to have the following:   Mild abdominal pain.  A small amount of blood-tinged fluid leaking from the replacement site. HOME CARE INSTRUCTIONS 1. You may resume your normal level of activity. 2. You may resume your normal feedings. 3. Care for your gastrostomy tube as you did before, or as directed by your health care provider. SEEK MEDICAL CARE IF:  You have a fever or chills.  You have redness or irritation near the insertion site.  You continue to have abdominal pain or leaking around your gastrostomy tube. SEEK IMMEDIATE MEDICAL CARE IF:  1. You develop bleeding or significant discharge around the tube. 2. You have severe abdominal pain. 3. Your new tube does not seem to be working properly. 4. You are unable to get feedings into the tube. 5. Your tube comes out for any reason.    This information is not intended to replace advice given to you by your health care provider. Make sure you discuss any questions you have with your health care provider.   Document Released: 05/24/2014 Document Reviewed: 05/24/2014 Elsevier Interactive Patient Education 2016 [place].  Gastrostomy Tube Home Guide, Adult A gastrostomy tube is a tube that is  surgically placed into the stomach. It is also called a "G-tube." G-tubes are used when a person is unable to eat and drink enough on their own to stay healthy. The tube is inserted into the stomach through a small cut (incision) in the skin. This tube is used for:  Feeding.  Giving medication. GASTROSTOMY TUBE CARE 4. Wash your hands with soap and water. 5. Remove the old dressing (if any). Some styles of G-tubes may need a dressing inserted between the skin and the G-tube. Other types of G-tubes do not require a dressing. Ask your health care provider if a dressing is needed. 6. Check the area where the tube enters the skin (insertion site) for redness, swelling, or pus-like (purulent) drainage. A small amount of clear or tan liquid drainage is normal. Check to make sure scar tissue (skin) is not growing around the insertion site. This could have a raised, bumpy appearance. 7. A cotton swab can be used to clean the skin around the tube: 1. When the G-tube is first put in, a normal saline solution or water can be used to clean the skin. 2. Mild soap and warm water can be used when the skin around the G-tube site has healed. 3. Roll the cotton swab around the G-tube insertion site to remove any drainage or crusting at the insertion site. STOMACH RESIDUALS Feeding tube residuals are the amount of liquids that are in the stomach at any given time. Residuals may be checked before giving feedings, medications, or as instructed by your health care provider.  Ask your health care  provider if there are instances when you would not start tube feedings depending on the amount or type of contents withdrawn from the stomach.  Check residuals by attaching a syringe to the G-tube and pulling back on the syringe plunger. Note the amount, and return the residual back into the stomach. FLUSHING THE G-TUBE 6. The G-tube should be periodically flushed with clean warm water to keep it from clogging.  Flush the  G-tube after feedings or medications. Draw up 30 mL of warm water in a syringe. Connect the syringe to the G-tube and slowly push the water into the tube.  Do not push feedings, medications, or flushes rapidly. Flush the G-tube gently and slowly.  Only use syringes made for G-tubes to flush medications or feedings.  Your health care provider may want the G-tube flushed more often or with more water. If this is the case, follow your health care provider's instructions. FEEDINGS Your health care provider will determine whether feedings are given as a bolus (a certain amount given at one time and at scheduled times) or whether feedings will be given continuously on a feeding pump.   Formulas should be given at room temperature.  If feedings are continuous, no more than 4 hours worth of feedings should be placed in the feeding bag. This helps prevent spoilage or accidental excess infusion.  Cover and place unused formula in the refrigerator.  If feedings are continuous, stop the feedings when medications or flushes are given. Be sure to restart the feedings.  Feeding bags and syringes should be replaced as instructed by your health care provider. GIVING MEDICATION   In general, it is best if all medications are in a liquid form for G-tube administration. Liquid medications are less likely to clog the G-tube.  Mix the liquid medication with 30 mL (or amount recommended by your health care provider) of warm water.  Draw up the medication into the syringe.  Attach the syringe to the G-tube and slowly push the mixture into the G-tube.  After giving the medication, draw up 30 mL of warm water in the syringe and slowly flush the G-tube.  For pills or capsules, check with your health care provider first before crushing medications. Some pills are not effective if they are crushed. Some capsules are sustained-release medications.  If appropriate, crush the pill or capsule and mix with 30 mL of  warm water. Using the syringe, slowly push the medication through the tube, then flush the tube with another 30 mL of tap water. G-TUBE PROBLEMS G-tube was pulled out.  Cause: May have been pulled out accidentally.  Solutions: Cover the opening with clean dressing and tape. Call your health care provider right away. The G-tube should be put in as soon as possible (within 4 hours) so the G-tube opening (tract) does not close. The G-tube needs to be put in at a health care setting. An X-ray needs to be done to confirm placement before the G-tube can be used again. Redness, irritation, soreness, or foul odor around the gastrostomy site.  Cause: May be caused by leakage or infection.  Solutions: Call your health care provider right away. Large amount of leakage of fluid or mucus-like liquid present (a large amount means it soaks clothing).  Cause: Many reasons could cause the G-tube to leak.  Solutions: Call your health care provider to discuss the amount of leakage. Skin or scar tissue appears to be growing where tube enters skin.   Cause: Tissue growth may develop  around the insertion site if the G-tube is moved or pulled on excessively.  Solutions: Secure tube with tape so that excess movement does not occur. Call your health care provider. G-tube is clogged.  Cause: Thick formula or medication.  Solutions: Try to slowly push warm water into the tube with a large syringe. Never try to push any object into the tube to unclog it. Do not force fluid into the G-tube. If you are unable to unclog the tube, call your health care provider right away. TIPS  Head of bed (HOB) position refers to the upright position of a person's upper body.  When giving medications or a feeding bolus, keep the [facility] up as told by your health care provider. Do this during the feeding and for 1 hour after the feeding or medication administration.  If continuous feedings are being given, it is best to keep the [facility]  up as told by your health care provider. When ADLs (activities of daily living) are performed and the [facility] needs to be flat, be sure to turn the feeding pump off. Restart the feeding pump when the [facility] is returned to the recommended height.  Do not pull or put tension on the tube.  To prevent fluid backflow, kink the G-tube before removing the cap or disconnecting a syringe.  Check the G-tube length every day. Measure from the insertion site to the end of the G-tube. If the length is longer than previous measurements, the tube may be coming out. Call your health care provider if you notice increasing G-tube length.  Oral care, such as brushing teeth, must be continued.  You may need to remove excess air (vent) from the G-tube. Your health care provider will tell you if this is needed.  Always call your health care provider if you have questions or problems with the G-tube. SEEK IMMEDIATE MEDICAL CARE IF:   You have severe abdominal pain, tenderness, or abdominal bloating (distension).  You have nausea or vomiting.  You are constipated or have problems moving your bowels.  The G-tube insertion site is red, swollen, has a foul smell, or has yellow or brown drainage.  You have difficulty breathing or shortness of breath.  You have a fever.  You have a large amount of feeding tube residuals.  The G-tube is clogged and cannot be flushed. MAKE SURE YOU:   Understand these instructions.  Will watch your condition.  Will get help right away if you are not doing well or get worse.   This information is not intended to replace advice given to you by your health care provider. Make sure you discuss any questions you have with your health care provider.   Document Released: 01/05/2002 Document Revised: 03/13/2015 Document Reviewed: 07/04/2013 Elsevier Interactive Patient Education 2016 [place] of a Feeding Tube People who have trouble swallowing or cannot take food or  medicine by mouth are sometimes given feeding tubes. A feeding tube can go into the nose and down to the stomach or through the skin in the abdomen and into the stomach or small bowel. Some of the names of these feeding tubes are gastrostomy tubes, PEG lines, nasogastric tubes, and gastrojejunostomy tubes.  SUPPLIES NEEDED TO CARE FOR THE TUBE SITE  Clean gloves.  Clean wash cloth, gauze pads, or soft paper towel.  Cotton swabs.  Skin barrier ointment or cream.  Soap and water.  Pre-cut foam pads or gauze (that go around the tube).  Tube tape. TUBE SITE CARE  8. Have all supplies ready and available. 9. Wash hands well. 10. Put on clean gloves. 11. Remove the soiled foam pad or gauze, if present, that is found under the tube stabilizer. Change the foam pad or gauze daily or when soiled or moist. 12. Check the skin around the tube site for redness, rash, swelling, drainage, or extra tissue growth. If you notice any of these, call your caregiver. 13. Moisten gauze and cotton swabs with water and soap. 14. Wipe the area closest to the tube (right near the stoma) with cotton swabs. Wipe the surrounding skin with moistened gauze. Rinse with water. 15. Dry the skin and stoma site with a dry gauze pad or soft paper towel. Do not use antibiotic ointments at the tube site. 16. If the skin is red, apply a skin barrier cream or ointment (such as petroleum jelly) in a circular motion, using a cotton swab. The cream or ointment will provide a moisture barrier for the skin and helps with wound healing. 17. Apply a new pre-cut foam pad or gauze around the tube. Secure it with tape around the edges. If no drainage is present, foam pads or gauze may be left off. 18. Use tape or an anchoring device to fasten the feeding tube to the skin for comfort or as directed. Rotate where you tape the tube to avoid skin damage from the adhesive. 19. Position the person in a semi-upright position (30-45 degree  angle). 74. Throw away used supplies. 21. Remove gloves. 22. Wash hands. SUPPLIES NEEDED TO FLUSH A FEEDING TUBE  Clean gloves.  60 mL syringe (that connects to the feeding tube).  Towel.  Water. FLUSHING A FEEDING TUBE  7. Have all supplies ready and available. 8. Wash hands well. 9. Put on clean gloves. 10. Draw up 30 mL of water in the syringe. 11. Kink the feeding tube while disconnecting it from the feeding-bag tubing or while removing the plug at the end of the tube. Kinking closes the tube and prevents secretions in the tube from spilling out. 12. Insert the tip of the syringe into the end of the feeding tube. Release the kink. Slowly inject the water. 13. If unable to inject the water, the person with the feeding tube should lay on his or her left side. The tip of the tube may be against the stomach wall, blocking fluid flow. Changing positions may move the tip away from the stomach wall. After repositioning, try injecting the water again. 14. After injecting the water, remove the syringe. 15. Always flush before giving the first medicine, between medicines, and after the final medicine before starting a feeding. This prevents medicines from clogging the tube. 16. Throw away used supplies. 17. Remove gloves. 18. Wash hands.   This information is not intended to replace advice given to you by your health care provider. Make sure you discuss any questions you have with your health care provider.   Document Released: 10/27/2005 Document Revised: 10/13/2012 Document Reviewed: 06/10/2012 Elsevier Interactive Patient Education [employer].

## 2016-04-18 LAB — T4: T4, Total: 5.6 ug/dL (ref 4.5–12.0)

## 2016-04-21 ENCOUNTER — Other Ambulatory Visit: Payer: Self-pay | Admitting: Internal Medicine

## 2016-04-21 ENCOUNTER — Other Ambulatory Visit: Payer: Self-pay | Admitting: *Deleted

## 2016-04-21 DIAGNOSIS — C029 Malignant neoplasm of tongue, unspecified: Secondary | ICD-10-CM

## 2016-04-21 MED ORDER — MORPHINE SULFATE (CONCENTRATE) 20 MG/ML PO SOLN: 20.0000 mg | ORAL | Status: DC | PRN

## 2016-04-24 DIAGNOSIS — C029 Malignant neoplasm of tongue, unspecified: Secondary | ICD-10-CM | POA: Diagnosis not present

## 2016-04-24 DIAGNOSIS — J690 Pneumonitis due to inhalation of food and vomit: Secondary | ICD-10-CM | POA: Diagnosis not present

## 2016-04-24 DIAGNOSIS — G4700 Insomnia, unspecified: Secondary | ICD-10-CM | POA: Diagnosis not present

## 2016-04-29 ENCOUNTER — Telehealth: Payer: Self-pay | Admitting: *Deleted

## 2016-04-29 ENCOUNTER — Other Ambulatory Visit: Payer: Self-pay | Admitting: *Deleted

## 2016-04-29 DIAGNOSIS — I10 Essential (primary) hypertension: Secondary | ICD-10-CM | POA: Diagnosis not present

## 2016-04-29 DIAGNOSIS — J690 Pneumonitis due to inhalation of food and vomit: Secondary | ICD-10-CM | POA: Diagnosis not present

## 2016-04-29 DIAGNOSIS — C069 Malignant neoplasm of mouth, unspecified: Secondary | ICD-10-CM | POA: Diagnosis not present

## 2016-04-29 DIAGNOSIS — K259 Gastric ulcer, unspecified as acute or chronic, without hemorrhage or perforation: Secondary | ICD-10-CM | POA: Diagnosis not present

## 2016-04-29 DIAGNOSIS — Z9981 Dependence on supplemental oxygen: Secondary | ICD-10-CM | POA: Diagnosis not present

## 2016-04-29 DIAGNOSIS — Z85818 Personal history of malignant neoplasm of other sites of lip, oral cavity, and pharynx: Secondary | ICD-10-CM | POA: Diagnosis not present

## 2016-04-29 DIAGNOSIS — E43 Unspecified severe protein-calorie malnutrition: Secondary | ICD-10-CM | POA: Diagnosis not present

## 2016-04-29 DIAGNOSIS — K219 Gastro-esophageal reflux disease without esophagitis: Secondary | ICD-10-CM | POA: Diagnosis not present

## 2016-04-29 DIAGNOSIS — C029 Malignant neoplasm of tongue, unspecified: Secondary | ICD-10-CM

## 2016-04-29 DIAGNOSIS — D649 Anemia, unspecified: Secondary | ICD-10-CM | POA: Diagnosis not present

## 2016-04-29 DIAGNOSIS — M1990 Unspecified osteoarthritis, unspecified site: Secondary | ICD-10-CM | POA: Diagnosis not present

## 2016-04-29 DIAGNOSIS — R1310 Dysphagia, unspecified: Secondary | ICD-10-CM | POA: Diagnosis not present

## 2016-04-29 DIAGNOSIS — Z431 Encounter for attention to gastrostomy: Secondary | ICD-10-CM | POA: Diagnosis not present

## 2016-04-29 DIAGNOSIS — G894 Chronic pain syndrome: Secondary | ICD-10-CM | POA: Diagnosis not present

## 2016-04-29 NOTE — Telephone Encounter (Signed)
Called to report that patient is complaining of dizziness since starting the Levaquin  Explained that benefits outweigh tthe risk and to instruct him to be cautious when getting up. Patient has appt Thursday

## 2016-05-01 ENCOUNTER — Inpatient Hospital Stay: Payer: Medicare Other

## 2016-05-01 ENCOUNTER — Inpatient Hospital Stay (HOSPITAL_BASED_OUTPATIENT_CLINIC_OR_DEPARTMENT_OTHER): Payer: Medicare Other | Admitting: Internal Medicine

## 2016-05-01 VITALS — BP 90/68 | HR 98 | Temp 97.1°F | Resp 18 | Wt 116.2 lb

## 2016-05-01 DIAGNOSIS — Z923 Personal history of irradiation: Secondary | ICD-10-CM

## 2016-05-01 DIAGNOSIS — C01 Malignant neoplasm of base of tongue: Secondary | ICD-10-CM

## 2016-05-01 DIAGNOSIS — I10 Essential (primary) hypertension: Secondary | ICD-10-CM | POA: Diagnosis not present

## 2016-05-01 DIAGNOSIS — C029 Malignant neoplasm of tongue, unspecified: Secondary | ICD-10-CM

## 2016-05-01 DIAGNOSIS — K219 Gastro-esophageal reflux disease without esophagitis: Secondary | ICD-10-CM | POA: Diagnosis not present

## 2016-05-01 DIAGNOSIS — E860 Dehydration: Secondary | ICD-10-CM | POA: Diagnosis not present

## 2016-05-01 DIAGNOSIS — I959 Hypotension, unspecified: Secondary | ICD-10-CM

## 2016-05-01 DIAGNOSIS — Z87891 Personal history of nicotine dependence: Secondary | ICD-10-CM | POA: Diagnosis not present

## 2016-05-01 DIAGNOSIS — Z8711 Personal history of peptic ulcer disease: Secondary | ICD-10-CM | POA: Diagnosis not present

## 2016-05-01 DIAGNOSIS — Z931 Gastrostomy status: Secondary | ICD-10-CM

## 2016-05-01 DIAGNOSIS — Z79899 Other long term (current) drug therapy: Secondary | ICD-10-CM | POA: Diagnosis not present

## 2016-05-01 DIAGNOSIS — M1990 Unspecified osteoarthritis, unspecified site: Secondary | ICD-10-CM | POA: Diagnosis not present

## 2016-05-01 DIAGNOSIS — Z5111 Encounter for antineoplastic chemotherapy: Secondary | ICD-10-CM | POA: Diagnosis not present

## 2016-05-01 LAB — CBC WITH DIFFERENTIAL/PLATELET
Basophils Absolute: 0.1 K/uL (ref 0–0.1)
Basophils Relative: 1 %
Eosinophils Absolute: 0.1 K/uL (ref 0–0.7)
Eosinophils Relative: 1 %
HCT: 32 % — ABNORMAL LOW (ref 40.0–52.0)
Hemoglobin: 10.8 g/dL — ABNORMAL LOW (ref 13.0–18.0)
Lymphocytes Relative: 35 %
Lymphs Abs: 2.7 K/uL (ref 1.0–3.6)
MCH: 27.1 pg (ref 26.0–34.0)
MCHC: 33.8 g/dL (ref 32.0–36.0)
MCV: 80.1 fL (ref 80.0–100.0)
Monocytes Absolute: 0.6 K/uL (ref 0.2–1.0)
Monocytes Relative: 8 %
Neutro Abs: 4.2 K/uL (ref 1.4–6.5)
Neutrophils Relative %: 55 %
Platelets: 469 K/uL — ABNORMAL HIGH (ref 150–440)
RBC: 3.99 MIL/uL — ABNORMAL LOW (ref 4.40–5.90)
RDW: 20.3 % — ABNORMAL HIGH (ref 11.5–14.5)
WBC: 7.6 K/uL (ref 3.8–10.6)

## 2016-05-01 LAB — COMPREHENSIVE METABOLIC PANEL WITH GFR
ALT: 9 U/L — ABNORMAL LOW (ref 17–63)
AST: 15 U/L (ref 15–41)
Albumin: 3 g/dL — ABNORMAL LOW (ref 3.5–5.0)
Alkaline Phosphatase: 79 U/L (ref 38–126)
Anion gap: 8 (ref 5–15)
BUN: 12 mg/dL (ref 6–20)
CO2: 28 mmol/L (ref 22–32)
Calcium: 8.4 mg/dL — ABNORMAL LOW (ref 8.9–10.3)
Chloride: 94 mmol/L — ABNORMAL LOW (ref 101–111)
Creatinine, Ser: 0.53 mg/dL — ABNORMAL LOW (ref 0.61–1.24)
GFR calc Af Amer: >60 mL/min (ref >60)
GFR calc non Af Amer: >60 mL/min (ref >60)
Glucose, Bld: 100 mg/dL — ABNORMAL HIGH (ref 65–99)
Potassium: 3.8 mmol/L (ref 3.5–5.1)
Sodium: 130 mmol/L — ABNORMAL LOW (ref 135–145)
Total Bilirubin: 0.2 mg/dL — ABNORMAL LOW (ref 0.3–1.2)
Total Protein: 6.9 g/dL (ref 6.5–8.1)

## 2016-05-01 MED ORDER — MORPHINE SULFATE (CONCENTRATE) 20 MG/ML PO SOLN: 20.0000 mg | ORAL | Status: DC | PRN

## 2016-05-01 MED ORDER — SODIUM CHLORIDE 0.9 % IV SOLN
Freq: Once | INTRAVENOUS | Status: AC
  Administered 2016-05-01: 11:00:00 via INTRAVENOUS
  Filled 2016-05-01: qty 1000

## 2016-05-01 MED ORDER — HEPARIN SOD (PORK) LOCK FLUSH 100 UNIT/ML IV SOLN
500.0000 [IU] | Freq: Once | INTRAVENOUS | Status: AC | PRN
  Administered 2016-05-01: 500 [IU]
  Filled 2016-05-01: qty 5

## 2016-05-01 MED ORDER — NIVOLUMAB CHEMO INJECTION 100 MG/10ML
3.0000 mg/kg | Freq: Once | INTRAVENOUS | Status: AC
  Administered 2016-05-01: 160 mg via INTRAVENOUS
  Filled 2016-05-01: qty 16

## 2016-05-01 MED ORDER — SODIUM CHLORIDE 0.9% FLUSH
10.0000 mL | INTRAVENOUS | Status: DC | PRN
  Administered 2016-05-01: 10 mL
  Filled 2016-05-01: qty 10

## 2016-05-01 NOTE — Assessment & Plan Note (Addendum)
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 Patient seems to be tolerating fairly well. No obvious progression of disease noted. Proceed with cycle #5 today. Labs reviewed. We'll plan to get a PET scan after 6 cycles.  # Borderline hypotension tachycardia/dizziness-dehydration. Recommend IV fluids today.  # Pain control- continue morphine liquid  # Will plan to get a PET scan after 6 cycles/in approximately 3 weeks. If patient has progression of disease hospice would be reasonable.  # Follow-up with me in 2 weeks/labs/infusion  PET - 3 weeks;

## 2016-05-01 NOTE — Progress Notes (Signed)
Patient states he pulled his feeding tube out by accident and had to have it replaced. Further states he is a little dizzy today.  BP 95/65  HR 87(Sitting) BP 90/68 (Standing).

## 2016-05-01 NOTE — Progress Notes (Signed)
[facility] OFFICE PROGRESS NOTE  Patient Care Team: Cyndi Bender, PA-C as PCP - General (Physician Assistant) Beverly Gust, MD (Unknown Physician Specialty)  No matching staging information was found for the patient.   Oncology History   1. Has a history of cancer of the tongue in 2006. Patient underwent resection followed by radiation therapy 2. Increasing difficulty in swallowing for last 2 or 3 weeks. Patient had upper endoscopy done in December of 2015. 3. Significant weight loss 4. Abnormal PET scan (August of 2016) 5. Recurrent versus second primary base of the tongue on the left side (unresectable) 6. Started on chemoradiation therapy. (Cis-platinum and radiation therapy) (September 2 016) T3 N0 M0 tumor-  cetuximab as well as radiation therapy by 23rd of November 2 016 8. Patient was recently hospitalized with bilateral pneumonia, aspiration pneumonia and was transferred to rehabilitation (December, 2016) 9.It is going to be started on palliative radiation therapy.(Radiation would finished in now May of 2017 10Patient was started on [facility] from April of 2017 7. Status post PEG tube placement (September, 2016)        Cancer of tongue [facility])    Initial Diagnosis Cancer of tongue ([place])     INTERVAL HISTORY:  [facility] 60 y.o.  male pleasant patient above history of Recurrent colon cancer status post palliative radiation may 2017. Patient is currently on Opdivo is here for follow-up. Patient is currently status post 3 treatments.  Patient denies any worsening swelling of his left side of the neck. Continues to have significant pain using morphine. He continues to use his PEG tube. However he feels he is dizzy; intermittent nausea but no vomiting. Denies any diarrhea. Poor by mouth intake. Positive weight loss.  His chronic shortness of breath no worse. Chronic cough-otherwise not any worse.   REVIEW OF SYSTEMS:  A complete 10 point  review of system is done which is negative except mentioned above/history of present illness.   PAST MEDICAL HISTORY :  Past Medical History  Diagnosis Date  . Hypertension   . Pneumonia     2004  . GERD (gastroesophageal reflux disease)   . Arthritis   . Anemia     blood transfusion 2012  . Bleeding ulcer   . Machinery accident     LEG INJURY  . 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 Protein-calorie malnutrition, severe ([place]) 07/05/2015  . Chronic pain syndrome 08/08/2015  . PUD (peptic ulcer disease) 08/08/2015    PAST SURGICAL HISTORY :   Past Surgical History  Procedure Laterality Date  . Leg surgery    . Joint replacement      right hip  . Tongue biopsy    . Tongue surgery      multiple surgeries  . Direct laryngoscopy N/A 06/26/2015    Procedure: Laryngoscopy with tongue base biopsy ;  Surgeon: Beverly Gust, MD;  Location: [facility] ORS;  Service: ENT;  Laterality: N/A;  . Esophagogastroduodenoscopy N/A 07/06/2015    Procedure: ESOPHAGOGASTRODUODENOSCOPY (EGD) and percutaneous gastrostomy tyube placement;  Surgeon: Lollie Sails, MD;  Location: [facility] ENDOSCOPY;  Service: Endoscopy;  Laterality: N/A;  Residual need to be done in the afternoon on 07/06/2015. Combined procedure with Dr. Gustavo Lah and Dr Rayann Heman  . Peripheral vascular catheterization N/A 07/27/2015    Procedure: Glori Luis Cath Insertion;  Surgeon: Katha Cabal, MD;  Location: [place] CV LAB;  Service: Cardiovascular;  Laterality: N/A;  . Esophagogastroduodenoscopy (egd) with propofol N/A  08/07/2015    Procedure: ESOPHAGOGASTRODUODENOSCOPY (EGD) WITH PROPOFOL;  Surgeon: Lollie Sails, MD;  Location: [facility] ENDOSCOPY;  Service: Endoscopy;  Laterality: N/A;  . Peg placement N/A 10/08/2015    Procedure: PERCUTANEOUS ENDOSCOPIC GASTROSTOMY (PEG) PLACEMENT;  Surgeon: Manya Silvas, MD;  Location: [facility] ENDOSCOPY;  Service: Endoscopy;  Laterality: N/A;    FAMILY HISTORY :   Family History  Problem  Relation Age of Onset  . CAD Mother   . CAD Father     SOCIAL HISTORY:   Social History  Substance Use Topics  . Smoking status: Former Smoker -- 1.00 packs/day for 0 years    Types: Cigarettes    Quit date: 09/21/2014  . Smokeless tobacco: Not on file  . Alcohol Use: 7.2 oz/week    12 Cans of beer per week     Comment: beer/wine every day    ALLERGIES:  has No Known Allergies.  MEDICATIONS:  Current Outpatient Prescriptions  Medication Sig Dispense Refill  . citalopram (CELEXA) 20 MG tablet Take 1 tablet (20 mg total) by mouth daily. 30 tablet 3  . dexamethasone (DECADRON) 4 MG tablet Take 1 tablet (4 mg total) by mouth 2 (two) times daily. x7 days, then 1 tab daily x7 days, then 1 tab every other day until finished 25 tablet 0  . fentaNYL (DURAGESIC - DOSED MCG/HR) 100 MCG/HR Place 1 patch (100 mcg total) onto the skin every 3 (three) days. 10 patch 0  . fentaNYL (DURAGESIC - DOSED MCG/HR) 25 MCG/HR patch Place 1 patch (25 mcg total) onto the skin every 3 (three) days. Use with 161mg patch for total dose of 1220m. 10 patch 0  . guaiFENesin (ROBITUSSIN) 100 MG/5ML SOLN Take 5 mLs (100 mg total) by mouth every 4 (four) hours as needed for cough or to loosen phlegm. 1200 mL 0  . morphine (ROXANOL) 20 MG/ML concentrated solution Place 1 mL (20 mg total) into feeding tube every hour as needed for severe pain. 240 mL 0  . Nutritional Supplements (FEEDING SUPPLEMENT, OSMOLITE 1.5 CAL,) LIQD Place 240 mLs into feeding tube 5 (five) times daily. 150 Bottle 3  . omeprazole (PRILOSEC) 20 MG capsule TAKE ONE CAPSULE BY MOUTH TWICE A DAY BEFORE MEALS 60 capsule 0  . ondansetron (ZOFRAN) 4 MG tablet Take 1 tablet (4 mg total) by mouth every 4 (four) hours as needed for nausea or vomiting. 45 tablet 0  . Oxycodone HCl 20 MG TABS     . phenol (CHLORASEPTIC) 1.4 % LIQD Use as directed 1 spray in the mouth or throat as needed for throat irritation / pain. 177 mL 0  . polyvinyl alcohol (LIQUIFILM  TEARS) 1.4 % ophthalmic solution Place 1 drop into both eyes as needed for dry eyes. 15 mL 0  . predniSONE 5 MG/5ML solution Place 20 mLs (20 mg total) into feeding tube daily with breakfast. 100 mL 3  . sucralfate (CARAFATE) 1 g tablet Take 1 tablet (1 g total) by mouth 3 (three) times daily. Dissolve each tablet in 2-3 tbsp warm water, swish and spit. 90 tablet 3  . zolpidem (AMBIEN) 10 MG tablet TAKE 1 TABLET BY MOUTH AT BEDTIME FOR SLEEP  2   No current facility-administered medications for this visit.   Facility-Administered Medications Ordered in Other Visits  Medication Dose Route Frequency Provider Last Rate Last Dose  . sodium chloride 0.9 % injection 10 mL  10 mL Intravenous PRN JaForest GleasonMD   10 mL at 10/24/15 1000  .  sodium chloride flush (NS) 0.9 % injection 10 mL  10 mL Intravenous PRN Forest Gleason, MD   10 mL at 04/17/16 1062    PHYSICAL EXAMINATION: ECOG PERFORMANCE STATUS: 2 - Symptomatic, <50% confined to bed  BP 90/68 mmHg  Pulse 98  Temp(Src) 97.1 F (36.2 C) (Tympanic)  Resp 18  Wt 116 lb 2.9 oz (52.7 kg)  Filed Weights   05/01/16 0959  Weight: 116 lb 2.9 oz (52.7 kg)    GENERAL: Cachectic appearing Caucasian male patient Alert, no distress and comfortable.  Accompanied by his wife EYES: no pallor or icterus OROPHARYNX: Poor dentition no thrush  No lymphadenopathy in the cervical, axillary or inguinal regions LUNGS: clear to auscultation and  No wheeze or crackles HEART/CVS: regular rate & rhythm and no murmurs; No lower extremity edema ABDOMEN:abdomen soft, non-tender and normal bowel sounds Musculoskeletal:no cyanosis of digits and no clubbing  PSYCH: alert & oriented x 3 with fluent speech NEURO: no focal motor/sensory deficits SKIN:  no rashes or significant lesions  LABORATORY DATA:  I have reviewed the data as listed    Component Value Date/Time   NA 130* 05/01/2016 0931   NA 132* 02/20/2014 1732   K 3.8 05/01/2016 0931   K 3.7 02/20/2014  1732   CL 94* 05/01/2016 0931   CL 102 02/20/2014 1732   CO2 28 05/01/2016 0931   CO2 20* 02/20/2014 1732   GLUCOSE 100* 05/01/2016 0931   GLUCOSE 106* 02/20/2014 1732   BUN 12 05/01/2016 0931   BUN 6* 02/20/2014 1732   CREATININE 0.53* 05/01/2016 0931   CREATININE 0.77 02/20/2014 1732   CALCIUM 8.4* 05/01/2016 0931   CALCIUM 8.7 02/20/2014 1732   PROT 6.9 05/01/2016 0931   PROT 7.9 02/20/2014 1732   ALBUMIN 3.0* 05/01/2016 0931   ALBUMIN 3.6 02/20/2014 1732   AST 15 05/01/2016 0931   AST 56* 02/20/2014 1732   ALT 9* 05/01/2016 0931   ALT 45 02/20/2014 1732   ALKPHOS 79 05/01/2016 0931   ALKPHOS 89 02/20/2014 1732   BILITOT 0.2* 05/01/2016 0931   BILITOT 0.6 02/20/2014 1732   GFRNONAA >60 05/01/2016 0931   GFRNONAA >60 02/20/2014 1732   GFRAA >60 05/01/2016 0931   GFRAA >60 02/20/2014 1732    No results found for: SPEP, UPEP  Lab Results  Component Value Date   WBC 7.6 05/01/2016   NEUTROABS 4.2 05/01/2016   HGB 10.8* 05/01/2016   HCT 32.0* 05/01/2016   MCV 80.1 05/01/2016   PLT 469* 05/01/2016      Chemistry      Component Value Date/Time   NA 130* 05/01/2016 0931   NA 132* 02/20/2014 1732   K 3.8 05/01/2016 0931   K 3.7 02/20/2014 1732   CL 94* 05/01/2016 0931   CL 102 02/20/2014 1732   CO2 28 05/01/2016 0931   CO2 20* 02/20/2014 1732   BUN 12 05/01/2016 0931   BUN 6* 02/20/2014 1732   CREATININE 0.53* 05/01/2016 0931   CREATININE 0.77 02/20/2014 1732      Component Value Date/Time   CALCIUM 8.4* 05/01/2016 0931   CALCIUM 8.7 02/20/2014 1732   ALKPHOS 79 05/01/2016 0931   ALKPHOS 89 02/20/2014 1732   AST 15 05/01/2016 0931   AST 56* 02/20/2014 1732   ALT 9* 05/01/2016 0931   ALT 45 02/20/2014 1732   BILITOT 0.2* 05/01/2016 0931   BILITOT 0.6 02/20/2014 1732       RADIOGRAPHIC STUDIES: I have personally reviewed the radiological  images as listed and agreed with the findings in the report. No results found.   ASSESSMENT & PLAN:  Cancer of  tongue ([place]) #Metastatic/recurrent tongue cancer- currently on palliative therapy with Opdivo status post 4 treatments. Patient seems to be tolerating fairly well. No obvious progression of disease noted. Proceed with cycle #5 today. Labs reviewed. We'll plan to get a PET scan after 6 cycles.  # Borderline hypotension tachycardia/dizziness-dehydration. Recommend IV fluids today.  # Pain control- continue morphine liquid  # Will plan to get a PET scan after 6 cycles/in approximately 3 weeks. If patient has progression of disease hospice would be reasonable.  # Follow-up with me in 2 weeks/labs/infusion  PET - 3 weeks;     Orders Placed This Encounter  Procedures  . NM PET Image Restag (PS) Skull Base To Thigh    Standing Status: Future     Number of Occurrences:      Standing Expiration Date: 07/01/2017    Order Specific Question:  Reason for Exam (SYMPTOM  OR DIAGNOSIS REQUIRED)    Answer:  head and neck cancer- re-staging    Order Specific Question:  Preferred imaging location?    Answer:  [facility]   All questions were answered. The patient knows to call the clinic with any problems, questions or concerns.      Cammie Sickle, MD 05/01/2016 5:46 PM

## 2016-05-07 ENCOUNTER — Other Ambulatory Visit
Admission: RE | Admit: 2016-05-07 | Discharge: 2016-05-07 | Disposition: A | Discharge status: Home / Self Care | Payer: Medicare Other | Source: Ambulatory Visit | Attending: Gastroenterology | Admitting: Gastroenterology

## 2016-05-07 DIAGNOSIS — R197 Diarrhea, unspecified: Secondary | ICD-10-CM | POA: Diagnosis not present

## 2016-05-07 LAB — C DIFFICILE QUICK SCREEN W PCR REFLEX
C DIFFICILE (CDIFF) TOXIN: NEGATIVE
C DIFFICLE (CDIFF) ANTIGEN: NEGATIVE
C Diff interpretation: NEGATIVE

## 2016-05-08 LAB — MISC LABCORP TEST (SEND OUT): Labcorp test code: 183480

## 2016-05-13 DIAGNOSIS — K219 Gastro-esophageal reflux disease without esophagitis: Secondary | ICD-10-CM | POA: Diagnosis not present

## 2016-05-13 DIAGNOSIS — R1310 Dysphagia, unspecified: Secondary | ICD-10-CM | POA: Diagnosis not present

## 2016-05-13 DIAGNOSIS — Z9981 Dependence on supplemental oxygen: Secondary | ICD-10-CM | POA: Diagnosis not present

## 2016-05-13 DIAGNOSIS — Z85818 Personal history of malignant neoplasm of other sites of lip, oral cavity, and pharynx: Secondary | ICD-10-CM | POA: Diagnosis not present

## 2016-05-13 DIAGNOSIS — D649 Anemia, unspecified: Secondary | ICD-10-CM | POA: Diagnosis not present

## 2016-05-13 DIAGNOSIS — E43 Unspecified severe protein-calorie malnutrition: Secondary | ICD-10-CM | POA: Diagnosis not present

## 2016-05-13 DIAGNOSIS — C069 Malignant neoplasm of mouth, unspecified: Secondary | ICD-10-CM | POA: Diagnosis not present

## 2016-05-13 DIAGNOSIS — K259 Gastric ulcer, unspecified as acute or chronic, without hemorrhage or perforation: Secondary | ICD-10-CM | POA: Diagnosis not present

## 2016-05-13 DIAGNOSIS — G894 Chronic pain syndrome: Secondary | ICD-10-CM | POA: Diagnosis not present

## 2016-05-13 DIAGNOSIS — Z431 Encounter for attention to gastrostomy: Secondary | ICD-10-CM | POA: Diagnosis not present

## 2016-05-13 DIAGNOSIS — M1990 Unspecified osteoarthritis, unspecified site: Secondary | ICD-10-CM | POA: Diagnosis not present

## 2016-05-13 DIAGNOSIS — J690 Pneumonitis due to inhalation of food and vomit: Secondary | ICD-10-CM | POA: Diagnosis not present

## 2016-05-13 DIAGNOSIS — I10 Essential (primary) hypertension: Secondary | ICD-10-CM | POA: Diagnosis not present

## 2016-05-14 ENCOUNTER — Other Ambulatory Visit: Payer: Self-pay | Admitting: Oncology

## 2016-05-14 ENCOUNTER — Other Ambulatory Visit: Payer: Self-pay | Admitting: *Deleted

## 2016-05-14 DIAGNOSIS — C029 Malignant neoplasm of tongue, unspecified: Secondary | ICD-10-CM

## 2016-05-15 ENCOUNTER — Inpatient Hospital Stay: Payer: Medicare Other | Attending: Internal Medicine

## 2016-05-15 ENCOUNTER — Inpatient Hospital Stay: Payer: Medicare Other

## 2016-05-15 ENCOUNTER — Inpatient Hospital Stay (HOSPITAL_BASED_OUTPATIENT_CLINIC_OR_DEPARTMENT_OTHER): Payer: Medicare Other | Admitting: Internal Medicine

## 2016-05-15 VITALS — BP 102/70 | HR 91 | Temp 96.8°F | Resp 18 | Wt 112.7 lb

## 2016-05-15 DIAGNOSIS — E860 Dehydration: Secondary | ICD-10-CM | POA: Insufficient documentation

## 2016-05-15 DIAGNOSIS — K219 Gastro-esophageal reflux disease without esophagitis: Secondary | ICD-10-CM | POA: Diagnosis not present

## 2016-05-15 DIAGNOSIS — G894 Chronic pain syndrome: Secondary | ICD-10-CM | POA: Diagnosis not present

## 2016-05-15 DIAGNOSIS — M1990 Unspecified osteoarthritis, unspecified site: Secondary | ICD-10-CM | POA: Diagnosis not present

## 2016-05-15 DIAGNOSIS — Z9221 Personal history of antineoplastic chemotherapy: Secondary | ICD-10-CM | POA: Insufficient documentation

## 2016-05-15 DIAGNOSIS — Z79899 Other long term (current) drug therapy: Secondary | ICD-10-CM | POA: Insufficient documentation

## 2016-05-15 DIAGNOSIS — Z87891 Personal history of nicotine dependence: Secondary | ICD-10-CM | POA: Diagnosis not present

## 2016-05-15 DIAGNOSIS — Z85810 Personal history of malignant neoplasm of tongue: Secondary | ICD-10-CM | POA: Insufficient documentation

## 2016-05-15 DIAGNOSIS — C01 Malignant neoplasm of base of tongue: Secondary | ICD-10-CM

## 2016-05-15 DIAGNOSIS — R000 Tachycardia, unspecified: Secondary | ICD-10-CM | POA: Insufficient documentation

## 2016-05-15 DIAGNOSIS — I959 Hypotension, unspecified: Secondary | ICD-10-CM | POA: Diagnosis not present

## 2016-05-15 DIAGNOSIS — R0602 Shortness of breath: Secondary | ICD-10-CM | POA: Diagnosis not present

## 2016-05-15 DIAGNOSIS — R05 Cough: Secondary | ICD-10-CM | POA: Insufficient documentation

## 2016-05-15 DIAGNOSIS — E876 Hypokalemia: Secondary | ICD-10-CM | POA: Insufficient documentation

## 2016-05-15 DIAGNOSIS — I10 Essential (primary) hypertension: Secondary | ICD-10-CM | POA: Diagnosis not present

## 2016-05-15 DIAGNOSIS — G893 Neoplasm related pain (acute) (chronic): Secondary | ICD-10-CM

## 2016-05-15 DIAGNOSIS — C029 Malignant neoplasm of tongue, unspecified: Secondary | ICD-10-CM

## 2016-05-15 DIAGNOSIS — Z8711 Personal history of peptic ulcer disease: Secondary | ICD-10-CM | POA: Insufficient documentation

## 2016-05-15 DIAGNOSIS — Z931 Gastrostomy status: Secondary | ICD-10-CM | POA: Diagnosis not present

## 2016-05-15 DIAGNOSIS — E43 Unspecified severe protein-calorie malnutrition: Secondary | ICD-10-CM | POA: Diagnosis not present

## 2016-05-15 DIAGNOSIS — K9423 Gastrostomy malfunction: Secondary | ICD-10-CM | POA: Diagnosis not present

## 2016-05-15 LAB — CBC WITH DIFFERENTIAL/PLATELET
Basophils Absolute: 0 10*3/uL (ref 0–0.1)
Basophils Relative: 0 %
Eosinophils Absolute: 0 10*3/uL (ref 0–0.7)
Eosinophils Relative: 1 %
HCT: 32.7 % — ABNORMAL LOW (ref 40.0–52.0)
HEMOGLOBIN: 10.8 g/dL — AB (ref 13.0–18.0)
LYMPHS ABS: 1.9 10*3/uL (ref 1.0–3.6)
LYMPHS PCT: 21 %
MCH: 26.8 pg (ref 26.0–34.0)
MCHC: 33.1 g/dL (ref 32.0–36.0)
MCV: 80.9 fL (ref 80.0–100.0)
MONOS PCT: 9 %
Monocytes Absolute: 0.8 10*3/uL (ref 0.2–1.0)
NEUTROS PCT: 69 %
Neutro Abs: 6.2 10*3/uL (ref 1.4–6.5)
Platelets: 339 10*3/uL (ref 150–440)
RBC: 4.04 MIL/uL — AB (ref 4.40–5.90)
RDW: 19.4 % — ABNORMAL HIGH (ref 11.5–14.5)
WBC: 8.9 10*3/uL (ref 3.8–10.6)

## 2016-05-15 LAB — COMPREHENSIVE METABOLIC PANEL
ALK PHOS: 77 U/L (ref 38–126)
ALT: 6 U/L — AB (ref 17–63)
ANION GAP: 9 (ref 5–15)
AST: 15 U/L (ref 15–41)
Albumin: 2.6 g/dL — ABNORMAL LOW (ref 3.5–5.0)
BILIRUBIN TOTAL: 0.2 mg/dL — AB (ref 0.3–1.2)
BUN: 13 mg/dL (ref 6–20)
CALCIUM: 8.4 mg/dL — AB (ref 8.9–10.3)
CO2: 30 mmol/L (ref 22–32)
CREATININE: 0.58 mg/dL — AB (ref 0.61–1.24)
Chloride: 94 mmol/L — ABNORMAL LOW (ref 101–111)
Glucose, Bld: 92 mg/dL (ref 65–99)
Potassium: 3.4 mmol/L — ABNORMAL LOW (ref 3.5–5.1)
Sodium: 133 mmol/L — ABNORMAL LOW (ref 135–145)
TOTAL PROTEIN: 6.2 g/dL — AB (ref 6.5–8.1)

## 2016-05-15 MED ORDER — HEPARIN SOD (PORK) LOCK FLUSH 100 UNIT/ML IV SOLN
500.0000 [IU] | Freq: Once | INTRAVENOUS | Status: AC
  Administered 2016-05-15: 500 [IU] via INTRAVENOUS
  Filled 2016-05-15: qty 5

## 2016-05-15 MED ORDER — SODIUM CHLORIDE 0.9% FLUSH
10.0000 mL | INTRAVENOUS | Status: DC | PRN
  Administered 2016-05-15: 10 mL via INTRAVENOUS
  Filled 2016-05-15: qty 10

## 2016-05-15 MED ORDER — SODIUM CHLORIDE 0.9 % IV SOLN
Freq: Once | INTRAVENOUS | Status: AC
  Administered 2016-05-15: 10:00:00 via INTRAVENOUS
  Filled 2016-05-15: qty 1000

## 2016-05-15 MED ORDER — MORPHINE SULFATE (CONCENTRATE) 20 MG/ML PO SOLN: ORAL | Status: DC

## 2016-05-15 MED ORDER — PREDNISONE 20 MG PO TABS: ORAL_TABLET | ORAL | Status: DC

## 2016-05-15 MED ORDER — DIPHENOXYLATE-ATROPINE 2.5-0.025 MG PO TABS
ORAL_TABLET | ORAL | Status: AC
Start: 2016-05-15 — End: ?

## 2016-05-15 NOTE — Progress Notes (Signed)
Patient states he accidentally pulled his feeding tube out again after his appointment 2 weeks ago and had to have it put back in.  It does not drain to gravity and has to be flushed a lot.  States he is only getting about 2 cans of Jevity a day instead of 6 that he was getting before.  States his throat is very sore and it hurts him to talk.  Also states he has had diarrhea since new feeding tube placed.

## 2016-05-15 NOTE — Progress Notes (Signed)
[place] OFFICE PROGRESS NOTE  Patient Care Team: Cyndi Bender, PA-C as PCP - General (Physician Assistant) Beverly Gust, MD (Unknown Physician Specialty)  No matching staging information was found for the patient.   Oncology History   1. Has a history of cancer of the tongue in 2006. Patient underwent resection followed by radiation therapy 2. Increasing difficulty in swallowing for last 2 or 3 weeks. Patient had upper endoscopy done in December of 2015. 3. Significant weight loss 4. Abnormal PET scan (August of 2016) 5. Recurrent versus second primary base of the tongue on the left side (unresectable) 6. Started on chemoradiation therapy. (Cis-platinum and radiation therapy) (September 2 016) T3 N0 M0 tumor-  cetuximab as well as radiation therapy by 23rd of November 2 016 8. Patient was recently hospitalized with bilateral pneumonia, aspiration pneumonia and was transferred to rehabilitation (December, 2016) 9.It is going to be started on palliative radiation therapy.(Radiation would finished in now May of 2017 10Patient was started on [facility] from April of 2017 7. Status post PEG tube placement (September, 2016)        Cancer of tongue [facility])    Initial Diagnosis Cancer of tongue ([place])     INTERVAL HISTORY:  [facility] Lee 60 y.o.  male pleasant patient above history of Recurrent colon cancer status post palliative radiation may 2017. Patient is currently on Opdivo is here for follow-up. Patient is currently status post 5 treatments  Patient accidentally had his PEG tube pulled out; this was replaced in the emergency room a week ago. He stated that he has not been able to use not more than 2 cans per day to the PEG tube since the replacement.   Complains of worsening pain in his right jaw continues use morphine; complains of poor appetite and poor by mouth intake. Positive weight loss.  His chronic shortness of breath no worse. Chronic  cough-otherwise not any worse.   REVIEW OF SYSTEMS:  A complete 10 point review of system is done which is negative except mentioned above/history of present illness.   PAST MEDICAL HISTORY :  Past Medical History  Diagnosis Date  . Hypertension   . Pneumonia     2004  . GERD (gastroesophageal reflux disease)   . Arthritis   . Anemia     blood transfusion 2012  . Bleeding ulcer   . Machinery accident     LEG INJURY  . 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 Protein-calorie malnutrition, severe ([place]) 07/05/2015  . Chronic pain syndrome 08/08/2015  . PUD (peptic ulcer disease) 08/08/2015    PAST SURGICAL HISTORY :   Past Surgical History  Procedure Laterality Date  . Leg surgery    . Joint replacement      right hip  . Tongue biopsy    . Tongue surgery      multiple surgeries  . Direct laryngoscopy N/A 06/26/2015    Procedure: Laryngoscopy with tongue base biopsy ;  Surgeon: Beverly Gust, MD;  Location: [facility] ORS;  Service: ENT;  Laterality: N/A;  . Esophagogastroduodenoscopy N/A 07/06/2015    Procedure: ESOPHAGOGASTRODUODENOSCOPY (EGD) and percutaneous gastrostomy tyube placement;  Surgeon: Lollie Sails, MD;  Location: [facility] ENDOSCOPY;  Service: Endoscopy;  Laterality: N/A;  Residual need to be done in the afternoon on 07/06/2015. Combined procedure with Dr. Gustavo Lah and Dr Rayann Heman  . Peripheral vascular catheterization N/A 07/27/2015    Procedure: Glori Luis Cath Insertion;  Surgeon: Katha Cabal,  MD;  Location: [place] [facility] CV LAB;  Service: Cardiovascular;  Laterality: N/A;  . Esophagogastroduodenoscopy (egd) with propofol N/A 08/07/2015    Procedure: ESOPHAGOGASTRODUODENOSCOPY (EGD) WITH PROPOFOL;  Surgeon: Lollie Sails, MD;  Location: [facility] ENDOSCOPY;  Service: Endoscopy;  Laterality: N/A;  . Peg placement N/A 10/08/2015    Procedure: PERCUTANEOUS ENDOSCOPIC GASTROSTOMY (PEG) PLACEMENT;  Surgeon: Manya Silvas, MD;  Location: [facility] ENDOSCOPY;  Service:  Endoscopy;  Laterality: N/A;    FAMILY HISTORY :   Family History  Problem Relation Age of Onset  . CAD Mother   . CAD Father     SOCIAL HISTORY:   Social History  Substance Use Topics  . Smoking status: Former Smoker -- 1.00 packs/day for 0 years    Types: Cigarettes    Quit date: 09/21/2014  . Smokeless tobacco: Not on file  . Alcohol Use: 7.2 oz/week    12 Cans of beer per week     Comment: beer/wine every day    ALLERGIES:  has No Known Allergies.  MEDICATIONS:  Current Outpatient Prescriptions  Medication Sig Dispense Refill  . citalopram (CELEXA) 20 MG tablet Take 1 tablet (20 mg total) by mouth daily. 30 tablet 3  . dexamethasone (DECADRON) 4 MG tablet Take 1 tablet (4 mg total) by mouth 2 (two) times daily. x7 days, then 1 tab daily x7 days, then 1 tab every other day until finished 25 tablet 0  . fentaNYL (DURAGESIC - DOSED MCG/HR) 100 MCG/HR Place 1 patch (100 mcg total) onto the skin every 3 (three) days. 10 patch 0  . fentaNYL (DURAGESIC - DOSED MCG/HR) 25 MCG/HR patch Place 1 patch (25 mcg total) onto the skin every 3 (three) days. Use with 128mg patch for total dose of 1219m. 10 patch 0  . guaiFENesin (ROBITUSSIN) 100 MG/5ML SOLN Take 5 mLs (100 mg total) by mouth every 4 (four) hours as needed for cough or to loosen phlegm. 1200 mL 0  . morphine (ROXANOL) 20 MG/ML concentrated solution Use 1 ml through PEG tube every 2-3 hours for pain 240 mL 0  . Nutritional Supplements (FEEDING SUPPLEMENT, OSMOLITE 1.5 CAL,) LIQD Place 240 mLs into feeding tube 5 (five) times daily. 150 Bottle 3  . omeprazole (PRILOSEC) 20 MG capsule TAKE ONE CAPSULE BY MOUTH TWICE A DAY BEFORE MEALS 60 capsule 0  . ondansetron (ZOFRAN) 4 MG tablet Take 1 tablet (4 mg total) by mouth every 4 (four) hours as needed for nausea or vomiting. 45 tablet 0  . Oxycodone HCl 20 MG TABS     . phenol (CHLORASEPTIC) 1.4 % LIQD Use as directed 1 spray in the mouth or throat as needed for throat irritation /  pain. 177 mL 0  . polyvinyl alcohol (LIQUIFILM TEARS) 1.4 % ophthalmic solution Place 1 drop into both eyes as needed for dry eyes. 15 mL 0  . predniSONE 5 MG/5ML solution Place 20 mLs (20 mg total) into feeding tube daily with breakfast. 100 mL 3  . sucralfate (CARAFATE) 1 g tablet Take 1 tablet (1 g total) by mouth 3 (three) times daily. Dissolve each tablet in 2-3 tbsp warm water, swish and spit. 90 tablet 3  . zolpidem (AMBIEN) 10 MG tablet TAKE 1 TABLET BY MOUTH AT BEDTIME FOR SLEEP  2  . diphenoxylate-atropine (LOMOTIL) 2.5-0.025 MG tablet 1 pill crushed every 4 hours as needed for diarrhea. Take it along with immodium 40 tablet 3  . predniSONE (DELTASONE) 20 MG tablet Take 3 pills once a  day x 7 days; and then 2 pills once a day x7 days- do not stop until directed. 80 tablet 0   No current facility-administered medications for this visit.   Facility-Administered Medications Ordered in Other Visits  Medication Dose Route Frequency Provider Last Rate Last Dose  . sodium chloride 0.9 % injection 10 mL  10 mL Intravenous PRN Forest Gleason, MD   10 mL at 10/24/15 1000  . sodium chloride flush (NS) 0.9 % injection 10 mL  10 mL Intravenous PRN Forest Gleason, MD   10 mL at 04/17/16 0929    PHYSICAL EXAMINATION: ECOG PERFORMANCE STATUS: 2 - Symptomatic, <50% confined to bed  BP 102/70 mmHg  Pulse 91  Temp(Src) 96.8 F (36 C) (Tympanic)  Resp 18  Wt 112 lb 10.5 oz (51.1 kg)  Filed Weights   05/15/16 0855  Weight: 112 lb 10.5 oz (51.1 kg)    GENERAL: Cachectic appearing Caucasian male patient Alert, no distress and comfortable.  Accompanied by his wife.He is in a wheelchair.   EYES: no pallor or icterus OROPHARYNX: Poor dentition no thrush  No lymphadenopathy in the cervical, axillary or inguinal regions LUNGS: clear to auscultation and  No wheeze or crackles HEART/CVS: regular rate & rhythm and no murmurs; No lower extremity edema ABDOMEN:abdomen soft, non-tender and normal bowel  sounds Musculoskeletal:no cyanosis of digits and no clubbing  PSYCH: alert & oriented x 3 with fluent speech NEURO: no focal motor/sensory deficits SKIN:  no rashes or significant lesions  LABORATORY DATA:  I have reviewed the data as listed    Component Value Date/Time   NA 133* 05/15/2016 0829   NA 132* 02/20/2014 1732   K 3.4* 05/15/2016 0829   K 3.7 02/20/2014 1732   CL 94* 05/15/2016 0829   CL 102 02/20/2014 1732   CO2 30 05/15/2016 0829   CO2 20* 02/20/2014 1732   GLUCOSE 92 05/15/2016 0829   GLUCOSE 106* 02/20/2014 1732   BUN 13 05/15/2016 0829   BUN 6* 02/20/2014 1732   CREATININE 0.58* 05/15/2016 0829   CREATININE 0.77 02/20/2014 1732   CALCIUM 8.4* 05/15/2016 0829   CALCIUM 8.7 02/20/2014 1732   PROT 6.2* 05/15/2016 0829   PROT 7.9 02/20/2014 1732   ALBUMIN 2.6* 05/15/2016 0829   ALBUMIN 3.6 02/20/2014 1732   AST 15 05/15/2016 0829   AST 56* 02/20/2014 1732   ALT 6* 05/15/2016 0829   ALT 45 02/20/2014 1732   ALKPHOS 77 05/15/2016 0829   ALKPHOS 89 02/20/2014 1732   BILITOT 0.2* 05/15/2016 0829   BILITOT 0.6 02/20/2014 1732   GFRNONAA >60 05/15/2016 0829   GFRNONAA >60 02/20/2014 1732   GFRAA >60 05/15/2016 0829   GFRAA >60 02/20/2014 1732    No results found for: SPEP, UPEP  Lab Results  Component Value Date   WBC 8.9 05/15/2016   NEUTROABS 6.2 05/15/2016   HGB 10.8* 05/15/2016   HCT 32.7* 05/15/2016   MCV 80.9 05/15/2016   PLT 339 05/15/2016      Chemistry      Component Value Date/Time   NA 133* 05/15/2016 0829   NA 132* 02/20/2014 1732   K 3.4* 05/15/2016 0829   K 3.7 02/20/2014 1732   CL 94* 05/15/2016 0829   CL 102 02/20/2014 1732   CO2 30 05/15/2016 0829   CO2 20* 02/20/2014 1732   BUN 13 05/15/2016 0829   BUN 6* 02/20/2014 1732   CREATININE 0.58* 05/15/2016 0829   CREATININE 0.77 02/20/2014 1732  Component Value Date/Time   CALCIUM 8.4* 05/15/2016 0829   CALCIUM 8.7 02/20/2014 1732   ALKPHOS 77 05/15/2016 0829   ALKPHOS  89 02/20/2014 1732   AST 15 05/15/2016 0829   AST 56* 02/20/2014 1732   ALT 6* 05/15/2016 0829   ALT 45 02/20/2014 1732   BILITOT 0.2* 05/15/2016 0829   BILITOT 0.6 02/20/2014 1732       RADIOGRAPHIC STUDIES: I have personally reviewed the radiological images as listed and agreed with the findings in the report. No results found.   ASSESSMENT & PLAN:  Cancer of tongue ([place]) #Metastatic/recurrent tongue cancer- currently on palliative therapy with Opdivo status post 5 treatments. Dirrhea ? Opdivo; Hold opdivo today; plan PET  # Diarrhea- ? Opdiv. Start prednsione '60mg'$  x1 w; '40mg'$ /d x1 week; also start lomotil with imoidum.   # Borderline hypotension tachycardia/dizziness-dehydration. Recommend IV fluids today.  # Pain control- continue morphine liquid. New script given.   # Follow-up with me in 2 weeks/labs/infusion; PET prior.     Orders Placed This Encounter  Procedures  . CBC with Differential    Standing Status: Standing     Number of Occurrences: 12     Standing Expiration Date: 05/15/2017  . Comprehensive metabolic panel    Standing Status: Standing     Number of Occurrences: 12     Standing Expiration Date: 05/15/2017   All questions were answered. The patient knows to call the clinic with any problems, questions or concerns.      Cammie Sickle, MD 05/15/2016 7:36 PM

## 2016-05-15 NOTE — Assessment & Plan Note (Addendum)
#  Metastatic/recurrent tongue cancer- currently on palliative therapy with Opdivo status post 5 treatments. Dirrhea ? Opdivo; Hold opdivo today; plan PET  # Diarrhea- ? Opdiv. Start prednsione '60mg'$  x1 w; '40mg'$ /d x1 week; also start lomotil with imoidum.   # Borderline hypotension tachycardia/dizziness-dehydration. Recommend IV fluids today.  # Pain control- continue morphine liquid. New script given.   # Follow-up with me in 2 weeks/labs/infusion; PET prior.

## 2016-05-15 NOTE — Progress Notes (Signed)
Pt has an apt with [facility] on Tuesday next week to discuss feeding tube function.  His caregiver, Altha Harm, will call our office back after this appointment to inform Dr. Rogue Bussing of the plan on the feeding tube.

## 2016-05-16 ENCOUNTER — Ambulatory Visit: Payer: Medicare Other

## 2016-05-20 ENCOUNTER — Ambulatory Visit
Admission: RE | Admit: 2016-05-20 | Discharge: 2016-05-20 | Disposition: A | Discharge status: Home / Self Care | Payer: Medicare Other | Source: Ambulatory Visit | Attending: Gastroenterology | Admitting: Gastroenterology

## 2016-05-20 ENCOUNTER — Other Ambulatory Visit: Payer: Self-pay | Admitting: Gastroenterology

## 2016-05-20 DIAGNOSIS — K9423 Gastrostomy malfunction: Secondary | ICD-10-CM | POA: Diagnosis not present

## 2016-05-20 DIAGNOSIS — Z4682 Encounter for fitting and adjustment of non-vascular catheter: Secondary | ICD-10-CM | POA: Diagnosis not present

## 2016-05-20 MED ORDER — DIATRIZOATE MEGLUMINE & SODIUM 66-10 % PO SOLN
30.0000 mL | Freq: Once | ORAL | Status: AC
  Administered 2016-05-20: 30 mL

## 2016-05-21 ENCOUNTER — Inpatient Hospital Stay: Payer: Medicare Other

## 2016-05-21 ENCOUNTER — Other Ambulatory Visit: Payer: Self-pay | Admitting: Internal Medicine

## 2016-05-21 ENCOUNTER — Other Ambulatory Visit: Payer: Self-pay | Admitting: Gastroenterology

## 2016-05-21 ENCOUNTER — Other Ambulatory Visit: Payer: Self-pay | Admitting: *Deleted

## 2016-05-21 ENCOUNTER — Ambulatory Visit
Admission: RE | Admit: 2016-05-21 | Discharge: 2016-05-21 | Disposition: A | Discharge status: Home / Self Care | Payer: Medicare Other | Source: Ambulatory Visit | Attending: Gastroenterology | Admitting: Gastroenterology

## 2016-05-21 ENCOUNTER — Inpatient Hospital Stay (HOSPITAL_BASED_OUTPATIENT_CLINIC_OR_DEPARTMENT_OTHER): Payer: Medicare Other | Admitting: Oncology

## 2016-05-21 VITALS — BP 102/69 | HR 72 | Temp 98.0°F | Resp 18

## 2016-05-21 DIAGNOSIS — E876 Hypokalemia: Secondary | ICD-10-CM

## 2016-05-21 DIAGNOSIS — C029 Malignant neoplasm of tongue, unspecified: Secondary | ICD-10-CM

## 2016-05-21 DIAGNOSIS — C01 Malignant neoplasm of base of tongue: Secondary | ICD-10-CM | POA: Diagnosis not present

## 2016-05-21 DIAGNOSIS — K9423 Gastrostomy malfunction: Principal | ICD-10-CM

## 2016-05-21 DIAGNOSIS — I959 Hypotension, unspecified: Secondary | ICD-10-CM | POA: Diagnosis not present

## 2016-05-21 DIAGNOSIS — K219 Gastro-esophageal reflux disease without esophagitis: Secondary | ICD-10-CM | POA: Diagnosis not present

## 2016-05-21 DIAGNOSIS — M1990 Unspecified osteoarthritis, unspecified site: Secondary | ICD-10-CM

## 2016-05-21 DIAGNOSIS — Z79899 Other long term (current) drug therapy: Secondary | ICD-10-CM

## 2016-05-21 DIAGNOSIS — Z8711 Personal history of peptic ulcer disease: Secondary | ICD-10-CM | POA: Diagnosis not present

## 2016-05-21 DIAGNOSIS — I10 Essential (primary) hypertension: Secondary | ICD-10-CM

## 2016-05-21 DIAGNOSIS — R05 Cough: Secondary | ICD-10-CM

## 2016-05-21 DIAGNOSIS — K9420 Gastrostomy complication, unspecified: Secondary | ICD-10-CM

## 2016-05-21 DIAGNOSIS — Z931 Gastrostomy status: Secondary | ICD-10-CM | POA: Diagnosis not present

## 2016-05-21 DIAGNOSIS — Z87891 Personal history of nicotine dependence: Secondary | ICD-10-CM | POA: Diagnosis not present

## 2016-05-21 DIAGNOSIS — E860 Dehydration: Secondary | ICD-10-CM | POA: Diagnosis not present

## 2016-05-21 DIAGNOSIS — R000 Tachycardia, unspecified: Secondary | ICD-10-CM | POA: Diagnosis not present

## 2016-05-21 DIAGNOSIS — G893 Neoplasm related pain (acute) (chronic): Secondary | ICD-10-CM | POA: Diagnosis not present

## 2016-05-21 DIAGNOSIS — E43 Unspecified severe protein-calorie malnutrition: Secondary | ICD-10-CM

## 2016-05-21 DIAGNOSIS — G894 Chronic pain syndrome: Secondary | ICD-10-CM | POA: Diagnosis not present

## 2016-05-21 DIAGNOSIS — Z85810 Personal history of malignant neoplasm of tongue: Secondary | ICD-10-CM

## 2016-05-21 DIAGNOSIS — Z4682 Encounter for fitting and adjustment of non-vascular catheter: Secondary | ICD-10-CM | POA: Diagnosis not present

## 2016-05-21 DIAGNOSIS — R0602 Shortness of breath: Secondary | ICD-10-CM

## 2016-05-21 DIAGNOSIS — Z9221 Personal history of antineoplastic chemotherapy: Secondary | ICD-10-CM

## 2016-05-21 LAB — CBC WITH DIFFERENTIAL/PLATELET
Basophils Absolute: 0.1 10*3/uL (ref 0–0.1)
Basophils Relative: 1 %
EOS PCT: 0 %
Eosinophils Absolute: 0 10*3/uL (ref 0–0.7)
HEMATOCRIT: 34.4 % — AB (ref 40.0–52.0)
HEMOGLOBIN: 11.4 g/dL — AB (ref 13.0–18.0)
LYMPHS ABS: 3.5 10*3/uL (ref 1.0–3.6)
LYMPHS PCT: 30 %
MCH: 26.5 pg (ref 26.0–34.0)
MCHC: 33 g/dL (ref 32.0–36.0)
MCV: 80.1 fL (ref 80.0–100.0)
Monocytes Absolute: 0.8 10*3/uL (ref 0.2–1.0)
Monocytes Relative: 7 %
Neutro Abs: 7.5 10*3/uL — ABNORMAL HIGH (ref 1.4–6.5)
Neutrophils Relative %: 62 %
PLATELETS: 370 10*3/uL (ref 150–440)
RBC: 4.29 MIL/uL — AB (ref 4.40–5.90)
RDW: 19 % — ABNORMAL HIGH (ref 11.5–14.5)
WBC: 12 10*3/uL — AB (ref 3.8–10.6)

## 2016-05-21 LAB — COMPREHENSIVE METABOLIC PANEL
ALK PHOS: 80 U/L (ref 38–126)
ALT: 8 U/L — AB (ref 17–63)
AST: 13 U/L — ABNORMAL LOW (ref 15–41)
Albumin: 2.4 g/dL — ABNORMAL LOW (ref 3.5–5.0)
Anion gap: 10 (ref 5–15)
BILIRUBIN TOTAL: 0.6 mg/dL (ref 0.3–1.2)
BUN: 10 mg/dL (ref 6–20)
CALCIUM: 8 mg/dL — AB (ref 8.9–10.3)
CO2: 30 mmol/L (ref 22–32)
CREATININE: 0.35 mg/dL — AB (ref 0.61–1.24)
Chloride: 92 mmol/L — ABNORMAL LOW (ref 101–111)
Glucose, Bld: 76 mg/dL (ref 65–99)
Potassium: 3.1 mmol/L — ABNORMAL LOW (ref 3.5–5.1)
Sodium: 132 mmol/L — ABNORMAL LOW (ref 135–145)
Total Protein: 6.2 g/dL — ABNORMAL LOW (ref 6.5–8.1)

## 2016-05-21 LAB — MAGNESIUM: Magnesium: 1.8 mg/dL (ref 1.7–2.4)

## 2016-05-21 MED ORDER — SODIUM CHLORIDE 0.9% FLUSH
10.0000 mL | INTRAVENOUS | Status: AC | PRN
  Filled 2016-05-21: qty 10

## 2016-05-21 MED ORDER — SODIUM CHLORIDE 0.9 % IV SOLN
Freq: Once | INTRAVENOUS | Status: AC
  Administered 2016-05-21: 15:00:00 via INTRAVENOUS
  Filled 2016-05-21: qty 1000

## 2016-05-21 MED ORDER — HEPARIN SOD (PORK) LOCK FLUSH 100 UNIT/ML IV SOLN
500.0000 [IU] | Freq: Once | INTRAVENOUS | Status: AC
  Administered 2016-05-21: 500 [IU] via INTRAVENOUS
  Filled 2016-05-21: qty 5

## 2016-05-21 MED ORDER — POTASSIUM CHLORIDE 20 MEQ PO PACK: 20.0000 meq | PACK | Freq: Every day | ORAL | Status: AC

## 2016-05-21 NOTE — Progress Notes (Signed)
[place] OFFICE PROGRESS NOTE  Patient Care Team: Cyndi Bender, PA-C as PCP - General (Physician Assistant) Beverly Gust, MD (Unknown Physician Specialty)  No matching staging information was found for the patient.   Oncology History   1. Has a history of cancer of the tongue in 2006. Patient underwent resection followed by radiation therapy 2. Increasing difficulty in swallowing for last 2 or 3 weeks. Patient had upper endoscopy done in December of 2015. 3. Significant weight loss 4. Abnormal PET scan (August of 2016) 5. Recurrent versus second primary base of the tongue on the left side (unresectable) 6. Started on chemoradiation therapy. (Cis-platinum and radiation therapy) (September 2 016) T3 N0 M0 tumor-  cetuximab as well as radiation therapy by 23rd of November 2 016 8. Patient was recently hospitalized with bilateral pneumonia, aspiration pneumonia and was transferred to rehabilitation (December, 2016) 9.It is going to be started on palliative radiation therapy.(Radiation would finished in now May of 2017 10Patient was started on [facility] from April of 2017 7. Status post PEG tube placement (September, 2016)        Cancer of tongue [facility])    Initial Diagnosis Cancer of tongue ([place])     INTERVAL HISTORY:  Grant Lee 60 y.o.  male pleasant patient above history of Recurrent colon cancer status post palliative radiation may 2017.   He is here today with weakness and dehydration. He is also having diarrhea. He is taking Imodium and prednisone. He feels well other than being very weak. He did see GI today to assess his feeding tube.  His chronic shortness of breath no worse. Chronic cough-otherwise not any worse.   REVIEW OF SYSTEMS:  A complete 10 point review of system is done which is negative except mentioned above/history of present illness.   PAST MEDICAL HISTORY :  Past Medical History  Diagnosis Date  . Hypertension    . Pneumonia     2004  . GERD (gastroesophageal reflux disease)   . Arthritis   . Anemia     blood transfusion 2012  . Bleeding ulcer   . Machinery accident     LEG INJURY  . Cancer ([place])     mouth 2006 then again 2016  . Protein-calorie malnutrition, severe ([place]) 07/05/2015  . Chronic pain syndrome 08/08/2015  . PUD (peptic ulcer disease) 08/08/2015    PAST SURGICAL HISTORY :   Past Surgical History  Procedure Laterality Date  . Leg surgery    . Joint replacement      right hip  . Tongue biopsy    . Tongue surgery      multiple surgeries  . Direct laryngoscopy N/A 06/26/2015    Procedure: Laryngoscopy with tongue base biopsy ;  Surgeon: Beverly Gust, MD;  Location: [facility] ORS;  Service: ENT;  Laterality: N/A;  . Esophagogastroduodenoscopy N/A 07/06/2015    Procedure: ESOPHAGOGASTRODUODENOSCOPY (EGD) and percutaneous gastrostomy tyube placement;  Surgeon: Lollie Sails, MD;  Location: [facility] ENDOSCOPY;  Service: Endoscopy;  Laterality: N/A;  Residual need to be done in the afternoon on 07/06/2015. Combined procedure with Dr. Gustavo Lah and Dr Rayann Heman  . Peripheral vascular catheterization N/A 07/27/2015    Procedure: Glori Luis Cath Insertion;  Surgeon: Katha Cabal, MD;  Location: [facility] CV LAB;  Service: Cardiovascular;  Laterality: N/A;  . Esophagogastroduodenoscopy (egd) with propofol N/A 08/07/2015    Procedure: ESOPHAGOGASTRODUODENOSCOPY (EGD) WITH PROPOFOL;  Surgeon: Lollie Sails, MD;  Location: [facility] ENDOSCOPY;  Service: Endoscopy;  Laterality: N/A;  .  Peg placement N/A 10/08/2015    Procedure: PERCUTANEOUS ENDOSCOPIC GASTROSTOMY (PEG) PLACEMENT;  Surgeon: Manya Silvas, MD;  Location: [facility] END[facility]COPY;  Service: Endoscopy;  Laterality: N/A;    FAMILY HISTORY :   Family History  Problem Relation Age of Onset  . CAD Mother   . CAD Father     SOCIAL HISTORY:   Social History  Substance Use Topics  . Smoking status: Former Smoker -- 1.00 packs/day for 0 years     Types: Cigarettes    Quit date: 09/21/2014  . Smokeless tobacco: Not on file  . Alcohol Use: 7.2 oz/week    12 Cans of beer per week     Comment: beer/wine every day    ALLERGIES:  has No Known Allergies.  MEDICATIONS:  Current Outpatient Prescriptions  Medication Sig Dispense Refill  . citalopram (CELEXA) 20 MG tablet Take 1 tablet (20 mg total) by mouth daily. 30 tablet 3  . dexamethasone (DECADRON) 4 MG tablet Take 1 tablet (4 mg total) by mouth 2 (two) times daily. x7 days, then 1 tab daily x7 days, then 1 tab every other day until finished 25 tablet 0  . diphenoxylate-atropine (LOMOTIL) 2.5-0.025 MG tablet 1 pill crushed every 4 hours as needed for diarrhea. Take it along with immodium 40 tablet 3  . fentaNYL (DURAGESIC - DOSED MCG/HR) 100 MCG/HR Place 1 patch (100 mcg total) onto the skin every 3 (three) days. 10 patch 0  . fentaNYL (DURAGESIC - DOSED MCG/HR) 25 MCG/HR patch Place 1 patch (25 mcg total) onto the skin every 3 (three) days. Use with 153mg patch for total dose of 122m. 10 patch 0  . guaiFENesin (ROBITUSSIN) 100 MG/5ML SOLN Take 5 mLs (100 mg total) by mouth every 4 (four) hours as needed for cough or to loosen phlegm. 1200 mL 0  . morphine (ROXANOL) 20 MG/ML concentrated solution Use 1 ml through PEG tube every 2-3 hours for pain 240 mL 0  . Nutritional Supplements (FEEDING SUPPLEMENT, OSMOLITE 1.5 CAL,) LIQD Place 240 mLs into feeding tube 5 (five) times daily. 150 Bottle 3  . omeprazole (PRILOSEC) 20 MG capsule TAKE ONE CAPSULE BY MOUTH TWICE A DAY BEFORE MEALS 60 capsule 0  . ondansetron (ZOFRAN) 4 MG tablet Take 1 tablet (4 mg total) by mouth every 4 (four) hours as needed for nausea or vomiting. 45 tablet 0  . Oxycodone HCl 20 MG TABS     . phenol (CHLORASEPTIC) 1.4 % LIQD Use as directed 1 spray in the mouth or throat as needed for throat irritation / pain. 177 mL 0  . polyvinyl alcohol (LIQUIFILM TEARS) 1.4 % ophthalmic solution Place 1 drop into both eyes  as needed for dry eyes. 15 mL 0  . predniSONE (DELTASONE) 20 MG tablet Take 3 pills once a day x 7 days; and then 2 pills once a day x7 days- do not stop until directed. 80 tablet 0  . predniSONE 5 MG/5ML solution Place 20 mLs (20 mg total) into feeding tube daily with breakfast. 100 mL 3  . sucralfate (CARAFATE) 1 g tablet Take 1 tablet (1 g total) by mouth 3 (three) times daily. Dissolve each tablet in 2-3 tbsp warm water, swish and spit. 90 tablet 3  . zolpidem (AMBIEN) 10 MG tablet TAKE 1 TABLET BY MOUTH AT BEDTIME FOR SLEEP  2   No current facility-administered medications for this visit.   Facility-Administered Medications Ordered in Other Visits  Medication Dose Route Frequency Provider Last Rate Last  Dose  . sodium chloride 0.9 % injection 10 mL  10 mL Intravenous PRN Forest Gleason, MD   10 mL at 10/24/15 1000  . sodium chloride flush (NS) 0.9 % injection 10 mL  10 mL Intravenous PRN Forest Gleason, MD   10 mL at 04/17/16 0929  . sodium chloride flush (NS) 0.9 % injection 10 mL  10 mL Intravenous PRN Cammie Sickle, MD        PHYSICAL EXAMINATION: ECOG PERFORMANCE STATUS: 2 - Symptomatic, <50% confined to bed  There were no vitals taken for this visit.  There were no vitals filed for this visit.  GENERAL: Cachectic appearing Caucasian male patient Alert, no distress and comfortable.  Accompanied by his wife EYES: no pallor or icterus OROPHARYNX: Poor dentition no thrush  LUNGS: clear to auscultation and  No wheeze or crackles HEART/CVS: regular rate & rhythm and no murmurs; No lower extremity edema ABDOMEN:abdomen soft, non-tender and normal bowel sounds Musculoskeletal:no cyanosis of digits and no clubbing  PSYCH: alert & oriented x 3 with fluent speech NEURO: no focal motor/sensory deficits SKIN:  no rashes or significant lesions  LABORATORY DATA:  I have reviewed the data as listed    Component Value Date/Time   NA 132* 05/21/2016 1445   NA 132* 02/20/2014 1732    K 3.1* 05/21/2016 1445   K 3.7 02/20/2014 1732   CL 92* 05/21/2016 1445   CL 102 02/20/2014 1732   CO2 30 05/21/2016 1445   CO2 20* 02/20/2014 1732   GLUCOSE 76 05/21/2016 1445   GLUCOSE 106* 02/20/2014 1732   BUN 10 05/21/2016 1445   BUN 6* 02/20/2014 1732   CREATININE 0.35* 05/21/2016 1445   CREATININE 0.77 02/20/2014 1732   CALCIUM 8.0* 05/21/2016 1445   CALCIUM 8.7 02/20/2014 1732   PROT 6.2* 05/21/2016 1445   PROT 7.9 02/20/2014 1732   ALBUMIN 2.4* 05/21/2016 1445   ALBUMIN 3.6 02/20/2014 1732   AST 13* 05/21/2016 1445   AST 56* 02/20/2014 1732   ALT 8* 05/21/2016 1445   ALT 45 02/20/2014 1732   ALKPHOS 80 05/21/2016 1445   ALKPHOS 89 02/20/2014 1732   BILITOT 0.6 05/21/2016 1445   BILITOT 0.6 02/20/2014 1732   GFRNONAA >60 05/21/2016 1445   GFRNONAA >60 02/20/2014 1732   GFRAA >60 05/21/2016 1445   GFRAA >60 02/20/2014 1732    No results found for: SPEP, UPEP  Lab Results  Component Value Date   WBC 12.0* 05/21/2016   NEUTROABS 7.5* 05/21/2016   HGB 11.4* 05/21/2016   HCT 34.4* 05/21/2016   MCV 80.1 05/21/2016   PLT 370 05/21/2016      Chemistry      Component Value Date/Time   NA 132* 05/21/2016 1445   NA 132* 02/20/2014 1732   K 3.1* 05/21/2016 1445   K 3.7 02/20/2014 1732   CL 92* 05/21/2016 1445   CL 102 02/20/2014 1732   CO2 30 05/21/2016 1445   CO2 20* 02/20/2014 1732   BUN 10 05/21/2016 1445   BUN 6* 02/20/2014 1732   CREATININE 0.35* 05/21/2016 1445   CREATININE 0.77 02/20/2014 1732      Component Value Date/Time   CALCIUM 8.0* 05/21/2016 1445   CALCIUM 8.7 02/20/2014 1732   ALKPHOS 80 05/21/2016 1445   ALKPHOS 89 02/20/2014 1732   AST 13* 05/21/2016 1445   AST 56* 02/20/2014 1732   ALT 8* 05/21/2016 1445   ALT 45 02/20/2014 1732   BILITOT 0.6 05/21/2016 1445  BILITOT 0.6 02/20/2014 1732       RADIOGRAPHIC STUDIES: I have personally reviewed the radiological images as listed and agreed with the findings in the report. Dg Abd  1 View  05/21/2016  CLINICAL DATA:  Check feeding tube. EXAM: ABDOMEN - 1 VIEW COMPARISON:  05/20/2016 FINDINGS: The feeding tube tip is in the upper mid abdomen. Stable air throughout the small bowel and colon could suggest a mild ileus. No free air. Right hip prosthesis noted along with chronic left hip AVN. IMPRESSION: The feeding tube tip is in the midline of the upper abdomen. Stable bowel gas pattern. Electronically Signed   By: Marijo Sanes M.D.   On: 05/21/2016 14:00   Dg Abd 1 View  05/20/2016  CLINICAL DATA:  PEG tube placement. Gastroview injection, reposition EXAM: ABDOMEN - 1 VIEW COMPARISON:  04/17/2016 FINDINGS: Contrast material is seen within decompressed small bowel loops. Nonobstructive bowel gas pattern. No free air there IMPRESSION: Contrast within decompressed small bowel loops. Electronically Signed   By: Rolm Baptise M.D.   On: 05/20/2016 11:12     ASSESSMENT & PLAN:   1. Tongue cancer- Patient has a PET scan schedule for July 14th. He will follow up on July 20 with Dr B.  2. Dehydration- 1 L IV fluids today and 1 L IV fluids on Friday.   3. Hypokalemia- 20 meq Potassium solution sent to pharmacy.   All questions were answered. The patient knows to call the clinic with any problems, questions or concerns.      Mayra Reel, NP 05/21/2016 4:33 PM

## 2016-05-22 ENCOUNTER — Encounter: Payer: Self-pay | Admitting: Radiation Oncology

## 2016-05-22 ENCOUNTER — Ambulatory Visit
Admission: RE | Admit: 2016-05-22 | Discharge: 2016-05-22 | Disposition: A | Discharge status: Home / Self Care | Payer: Medicare Other | Source: Ambulatory Visit | Attending: Radiation Oncology | Admitting: Radiation Oncology

## 2016-05-22 VITALS — BP 98/69 | HR 89 | Temp 97.5°F | Resp 20 | Wt 114.2 lb

## 2016-05-22 DIAGNOSIS — C029 Malignant neoplasm of tongue, unspecified: Secondary | ICD-10-CM

## 2016-05-22 DIAGNOSIS — F1120 Opioid dependence, uncomplicated: Secondary | ICD-10-CM | POA: Diagnosis not present

## 2016-05-22 DIAGNOSIS — Z923 Personal history of irradiation: Secondary | ICD-10-CM | POA: Insufficient documentation

## 2016-05-22 DIAGNOSIS — C01 Malignant neoplasm of base of tongue: Secondary | ICD-10-CM | POA: Diagnosis not present

## 2016-05-22 DIAGNOSIS — R197 Diarrhea, unspecified: Secondary | ICD-10-CM | POA: Insufficient documentation

## 2016-05-22 NOTE — Progress Notes (Signed)
Radiation Oncology Follow up Note  Name: CHRISTOPH COPELAN   Date:   05/22/2016 MRN:  [id number] DOB: 02-Apr-1956    This 60 y.o. male presents to the clinic today for one-month follow-up for salvage radiation therapy for recurrent head and neck cancer with base of tongue involvement.  REFERRING PROVIDER: Cyndi Bender, PA-C  HPI: Patient is a 60 year old male now out for his second salvage treatment for recurrent head and neck cancer with base of tongue involvement. We just completed a course of hypofractionated radiation therapy 3000 cGy in 3 weeks and 100 cGy twice a day. He is seen today and doing poorly had his feeding tube replaced which is causing some diarrhea. He states the pain in the head and neck region is somewhat improved and swallowing may be slightly better.Marland Kitchen He is currently on up up Opdivo. 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  COMPLICATIONS OF TREATMENT: none  FOLLOW UP COMPLIANCE: keeps appointments   PHYSICAL EXAM:  BP 98/69 mmHg  Pulse 89  Temp(Src) 97.5 F (36.4 C)  Resp 20  Wt 114 lb 3.2 oz (51.8 kg) Frail-appearing wheelchair-bound male in NAD. Oral cavity shows no evidence of mucositis. Indirect mirror examination shows upper airway clear. Well-developed well-nourished patient in NAD. HEENT reveals PERLA, EOMI, discs not visualized.  Oral cavity is clear. No oral mucosal lesions are identified. Neck is clear without evidence of cervical or supraclavicular adenopathy. Lungs are clear to A&P. Cardiac examination is essentially unremarkable with regular rate and rhythm without murmur rub or thrill. Abdomen is benign with no organomegaly or masses noted. Motor sensory and DTR levels are equal and symmetric in the upper and lower extremities. Cranial nerves II through XII are grossly intact. Proprioception is intact. No peripheral adenopathy or edema is identified. No motor or sensory levels are noted. Crude visual fields are within normal  range.  RADIOLOGY RESULTS: No current films for review  PLAN: At the present time patient continues to decline. He probably will be a hospice candidate or should be a hospice candidate at this time. He continues close follow-up care with medical oncology. I have asked to see him back in 4 months for follow-up. We'll be happy to reevaluate him at any time for further palliative treatment.  I would like to take this opportunity to thank you for allowing me to participate in the care of your patient.Armstead Peaks., MD

## 2016-05-23 ENCOUNTER — Telehealth: Payer: Self-pay

## 2016-05-23 ENCOUNTER — Inpatient Hospital Stay: Payer: Medicare Other

## 2016-05-23 ENCOUNTER — Other Ambulatory Visit
Admission: RE | Admit: 2016-05-23 | Discharge: 2016-05-23 | Disposition: A | Discharge status: Home / Self Care | Payer: Medicare Other | Source: Ambulatory Visit | Attending: *Deleted | Admitting: *Deleted

## 2016-05-23 ENCOUNTER — Ambulatory Visit
Admission: RE | Admit: 2016-05-23 | Discharge: 2016-05-23 | Disposition: A | Discharge status: Home / Self Care | Payer: Medicare Other | Source: Ambulatory Visit | Attending: Internal Medicine | Admitting: Internal Medicine

## 2016-05-23 ENCOUNTER — Other Ambulatory Visit: Payer: Self-pay | Admitting: Internal Medicine

## 2016-05-23 VITALS — BP 108/72 | HR 91 | Temp 97.4°F | Resp 18

## 2016-05-23 DIAGNOSIS — E43 Unspecified severe protein-calorie malnutrition: Secondary | ICD-10-CM | POA: Diagnosis not present

## 2016-05-23 DIAGNOSIS — Z8711 Personal history of peptic ulcer disease: Secondary | ICD-10-CM | POA: Diagnosis not present

## 2016-05-23 DIAGNOSIS — Z87891 Personal history of nicotine dependence: Secondary | ICD-10-CM | POA: Diagnosis not present

## 2016-05-23 DIAGNOSIS — C029 Malignant neoplasm of tongue, unspecified: Secondary | ICD-10-CM | POA: Diagnosis not present

## 2016-05-23 DIAGNOSIS — K9423 Gastrostomy malfunction: Secondary | ICD-10-CM | POA: Insufficient documentation

## 2016-05-23 DIAGNOSIS — Z931 Gastrostomy status: Secondary | ICD-10-CM | POA: Diagnosis not present

## 2016-05-23 DIAGNOSIS — R188 Other ascites: Secondary | ICD-10-CM | POA: Diagnosis not present

## 2016-05-23 DIAGNOSIS — E860 Dehydration: Secondary | ICD-10-CM

## 2016-05-23 DIAGNOSIS — Z9221 Personal history of antineoplastic chemotherapy: Secondary | ICD-10-CM | POA: Diagnosis not present

## 2016-05-23 DIAGNOSIS — Z85810 Personal history of malignant neoplasm of tongue: Secondary | ICD-10-CM | POA: Diagnosis not present

## 2016-05-23 DIAGNOSIS — R05 Cough: Secondary | ICD-10-CM | POA: Diagnosis not present

## 2016-05-23 DIAGNOSIS — G894 Chronic pain syndrome: Secondary | ICD-10-CM | POA: Diagnosis not present

## 2016-05-23 DIAGNOSIS — Z79899 Other long term (current) drug therapy: Secondary | ICD-10-CM | POA: Diagnosis not present

## 2016-05-23 DIAGNOSIS — E876 Hypokalemia: Secondary | ICD-10-CM | POA: Diagnosis not present

## 2016-05-23 DIAGNOSIS — R000 Tachycardia, unspecified: Secondary | ICD-10-CM | POA: Diagnosis not present

## 2016-05-23 DIAGNOSIS — M1990 Unspecified osteoarthritis, unspecified site: Secondary | ICD-10-CM | POA: Diagnosis not present

## 2016-05-23 DIAGNOSIS — G893 Neoplasm related pain (acute) (chronic): Secondary | ICD-10-CM | POA: Diagnosis not present

## 2016-05-23 DIAGNOSIS — K219 Gastro-esophageal reflux disease without esophagitis: Secondary | ICD-10-CM | POA: Diagnosis not present

## 2016-05-23 DIAGNOSIS — C01 Malignant neoplasm of base of tongue: Secondary | ICD-10-CM | POA: Diagnosis not present

## 2016-05-23 DIAGNOSIS — I959 Hypotension, unspecified: Secondary | ICD-10-CM | POA: Diagnosis not present

## 2016-05-23 DIAGNOSIS — R0602 Shortness of breath: Secondary | ICD-10-CM | POA: Diagnosis not present

## 2016-05-23 DIAGNOSIS — I10 Essential (primary) hypertension: Secondary | ICD-10-CM | POA: Diagnosis not present

## 2016-05-23 LAB — C DIFFICILE QUICK SCREEN W PCR REFLEX
C Diff antigen: NEGATIVE
C Diff interpretation: NOT DETECTED
C Diff toxin: NEGATIVE

## 2016-05-23 LAB — GLUCOSE, CAPILLARY: GLUCOSE-CAPILLARY: 65 mg/dL (ref 65–99)

## 2016-05-23 MED ORDER — HEPARIN SOD (PORK) LOCK FLUSH 100 UNIT/ML IV SOLN
500.0000 [IU] | Freq: Once | INTRAVENOUS | Status: AC
  Administered 2016-05-23: 500 [IU] via INTRAVENOUS
  Filled 2016-05-23: qty 5

## 2016-05-23 MED ORDER — SODIUM CHLORIDE 0.9 % IV SOLN
Freq: Once | INTRAVENOUS | Status: AC
  Administered 2016-05-23: 11:00:00 via INTRAVENOUS
  Filled 2016-05-23: qty 1000

## 2016-05-23 MED ORDER — FLUDEOXYGLUCOSE F - 18 (FDG) INJECTION
12.0700 | Freq: Once | INTRAVENOUS | Status: AC | PRN
  Administered 2016-05-23: 12.07 via INTRAVENOUS

## 2016-05-23 MED ORDER — SODIUM CHLORIDE 0.9% FLUSH
10.0000 mL | INTRAVENOUS | Status: DC | PRN
  Administered 2016-05-23: 10 mL via INTRAVENOUS
  Filled 2016-05-23: qty 10

## 2016-05-23 NOTE — Telephone Encounter (Signed)
Olivia Mackie from radiology called to report that patients xray shoed a malpositioned g-tube

## 2016-05-26 ENCOUNTER — Other Ambulatory Visit: Payer: Self-pay | Admitting: Internal Medicine

## 2016-05-26 ENCOUNTER — Telehealth: Payer: Self-pay | Admitting: Internal Medicine

## 2016-05-26 NOTE — Telephone Encounter (Signed)
Left msg for patient. To contact our office to coordinate his gi referral to Dr. Gwenlyn Perking.  msg sent to cancer center scheduling to coordinate an office apt this Thursday with Dr. Irwin Brakeman to pt's malpositioned G tube.  per Dr. Thurman Coyer. Angelica Chessman stated that he could see the patient at [facility] on Thursday.

## 2016-05-26 NOTE — Progress Notes (Signed)
05/21/2016 visit - Dr. Rogue Bussing was available for consultation and review of plan of care for this patient.

## 2016-05-26 NOTE — Telephone Encounter (Signed)
Spoke to Dr.Skulskie-re: PEG malpositioing; he recommends pt follow up in their office on Thursday when he comes to see [place]. Please inform Dr.Skulskie's office/pt-family.

## 2016-05-28 ENCOUNTER — Other Ambulatory Visit: Payer: Self-pay | Admitting: Gastroenterology

## 2016-05-28 DIAGNOSIS — R197 Diarrhea, unspecified: Secondary | ICD-10-CM

## 2016-05-28 DIAGNOSIS — R1084 Generalized abdominal pain: Secondary | ICD-10-CM

## 2016-05-29 ENCOUNTER — Inpatient Hospital Stay: Payer: Medicare Other

## 2016-05-29 ENCOUNTER — Inpatient Hospital Stay (HOSPITAL_BASED_OUTPATIENT_CLINIC_OR_DEPARTMENT_OTHER): Payer: Medicare Other | Admitting: Internal Medicine

## 2016-05-29 VITALS — BP 100/70 | HR 106 | Temp 97.8°F | Resp 18 | Ht 74.0 in | Wt 108.0 lb

## 2016-05-29 VITALS — BP 116/77 | HR 80 | Temp 98.0°F | Resp 18

## 2016-05-29 DIAGNOSIS — K219 Gastro-esophageal reflux disease without esophagitis: Secondary | ICD-10-CM

## 2016-05-29 DIAGNOSIS — G894 Chronic pain syndrome: Secondary | ICD-10-CM

## 2016-05-29 DIAGNOSIS — Z85810 Personal history of malignant neoplasm of tongue: Secondary | ICD-10-CM

## 2016-05-29 DIAGNOSIS — Z87891 Personal history of nicotine dependence: Secondary | ICD-10-CM | POA: Diagnosis not present

## 2016-05-29 DIAGNOSIS — I10 Essential (primary) hypertension: Secondary | ICD-10-CM | POA: Diagnosis not present

## 2016-05-29 DIAGNOSIS — Z9221 Personal history of antineoplastic chemotherapy: Secondary | ICD-10-CM

## 2016-05-29 DIAGNOSIS — K9423 Gastrostomy malfunction: Secondary | ICD-10-CM

## 2016-05-29 DIAGNOSIS — C029 Malignant neoplasm of tongue, unspecified: Secondary | ICD-10-CM | POA: Diagnosis not present

## 2016-05-29 DIAGNOSIS — Z931 Gastrostomy status: Secondary | ICD-10-CM

## 2016-05-29 DIAGNOSIS — I959 Hypotension, unspecified: Secondary | ICD-10-CM | POA: Diagnosis not present

## 2016-05-29 DIAGNOSIS — R0602 Shortness of breath: Secondary | ICD-10-CM | POA: Diagnosis not present

## 2016-05-29 DIAGNOSIS — E860 Dehydration: Secondary | ICD-10-CM

## 2016-05-29 DIAGNOSIS — Z79899 Other long term (current) drug therapy: Secondary | ICD-10-CM | POA: Diagnosis not present

## 2016-05-29 DIAGNOSIS — Z8711 Personal history of peptic ulcer disease: Secondary | ICD-10-CM

## 2016-05-29 DIAGNOSIS — M1990 Unspecified osteoarthritis, unspecified site: Secondary | ICD-10-CM

## 2016-05-29 DIAGNOSIS — R000 Tachycardia, unspecified: Secondary | ICD-10-CM | POA: Diagnosis not present

## 2016-05-29 DIAGNOSIS — C01 Malignant neoplasm of base of tongue: Secondary | ICD-10-CM | POA: Diagnosis not present

## 2016-05-29 DIAGNOSIS — E876 Hypokalemia: Secondary | ICD-10-CM | POA: Diagnosis not present

## 2016-05-29 DIAGNOSIS — R05 Cough: Secondary | ICD-10-CM | POA: Diagnosis not present

## 2016-05-29 DIAGNOSIS — G893 Neoplasm related pain (acute) (chronic): Secondary | ICD-10-CM | POA: Diagnosis not present

## 2016-05-29 DIAGNOSIS — E43 Unspecified severe protein-calorie malnutrition: Secondary | ICD-10-CM | POA: Diagnosis not present

## 2016-05-29 LAB — CBC WITH DIFFERENTIAL/PLATELET
Basophils Absolute: 0 10*3/uL (ref 0–0.1)
Basophils Relative: 0 %
EOS ABS: 0 10*3/uL (ref 0–0.7)
Eosinophils Relative: 0 %
HEMATOCRIT: 33.6 % — AB (ref 40.0–52.0)
HEMOGLOBIN: 11.2 g/dL — AB (ref 13.0–18.0)
LYMPHS ABS: 2.5 10*3/uL (ref 1.0–3.6)
Lymphocytes Relative: 19 %
MCH: 26.9 pg (ref 26.0–34.0)
MCHC: 33.3 g/dL (ref 32.0–36.0)
MCV: 80.7 fL (ref 80.0–100.0)
MONOS PCT: 5 %
Monocytes Absolute: 0.7 10*3/uL (ref 0.2–1.0)
NEUTROS PCT: 76 %
Neutro Abs: 9.8 10*3/uL — ABNORMAL HIGH (ref 1.4–6.5)
Platelets: 401 10*3/uL (ref 150–440)
RBC: 4.16 MIL/uL — ABNORMAL LOW (ref 4.40–5.90)
RDW: 18.6 % — ABNORMAL HIGH (ref 11.5–14.5)
WBC: 13.1 10*3/uL — ABNORMAL HIGH (ref 3.8–10.6)

## 2016-05-29 LAB — COMPREHENSIVE METABOLIC PANEL
ALK PHOS: 91 U/L (ref 38–126)
ALT: 7 U/L — ABNORMAL LOW (ref 17–63)
ANION GAP: 7 (ref 5–15)
AST: 13 U/L — ABNORMAL LOW (ref 15–41)
Albumin: 2.2 g/dL — ABNORMAL LOW (ref 3.5–5.0)
BILIRUBIN TOTAL: 0.4 mg/dL (ref 0.3–1.2)
BUN: 9 mg/dL (ref 6–20)
CALCIUM: 7.8 mg/dL — AB (ref 8.9–10.3)
CO2: 24 mmol/L (ref 22–32)
Chloride: 100 mmol/L — ABNORMAL LOW (ref 101–111)
Creatinine, Ser: 0.4 mg/dL — ABNORMAL LOW (ref 0.61–1.24)
Glucose, Bld: 102 mg/dL — ABNORMAL HIGH (ref 65–99)
Potassium: 3.9 mmol/L (ref 3.5–5.1)
SODIUM: 131 mmol/L — AB (ref 135–145)
TOTAL PROTEIN: 5.8 g/dL — AB (ref 6.5–8.1)

## 2016-05-29 MED ORDER — FENTANYL 100 MCG/HR TD PT72: 100.0000 ug | MEDICATED_PATCH | TRANSDERMAL | Status: AC

## 2016-05-29 MED ORDER — HEPARIN SOD (PORK) LOCK FLUSH 100 UNIT/ML IV SOLN
500.0000 [IU] | Freq: Once | INTRAVENOUS | Status: AC
  Administered 2016-05-29: 500 [IU] via INTRAVENOUS
  Filled 2016-05-29: qty 5

## 2016-05-29 MED ORDER — SODIUM CHLORIDE 0.9 % IV SOLN
Freq: Once | INTRAVENOUS | Status: AC
  Administered 2016-05-29: 10:00:00 via INTRAVENOUS
  Filled 2016-05-29: qty 1000

## 2016-05-29 MED ORDER — FENTANYL 25 MCG/HR TD PT72: 25.0000 ug | MEDICATED_PATCH | TRANSDERMAL | Status: AC

## 2016-05-29 MED ORDER — SODIUM CHLORIDE 0.9% FLUSH
10.0000 mL | Freq: Once | INTRAVENOUS | Status: AC
  Administered 2016-05-29: 10 mL via INTRAVENOUS
  Filled 2016-05-29: qty 10

## 2016-05-29 MED ORDER — MORPHINE SULFATE (CONCENTRATE) 20 MG/ML PO SOLN: ORAL | Status: AC

## 2016-05-29 NOTE — Progress Notes (Signed)
[facility] OFFICE PROGRESS NOTE  Patient Care Team: Cyndi Bender, PA-C as PCP - General (Physician Assistant) Beverly Gust, MD (Unknown Physician Specialty)  No matching staging information was found for the patient.   Oncology History   1. Has a history of cancer of the tongue in 2006. Patient underwent resection followed by radiation therapy 2. Increasing difficulty in swallowing for last 2 or 3 weeks. Patient had upper endoscopy done in December of 2015. 3. Significant weight loss 4. Abnormal PET scan (August of 2016) 5. Recurrent versus second primary base of the tongue on the left side (unresectable) 6. Started on chemoradiation therapy. (Cis-platinum and radiation therapy) (September 2 016) T3 N0 M0 tumor-  cetuximab as well as radiation therapy by 23rd of November 2 016 8. Patient was recently hospitalized with bilateral pneumonia, aspiration pneumonia and was transferred to rehabilitation (December, 2016) 9.It is going to be started on palliative radiation therapy.(Radiation would finished in now May of 2017 10Patient was started on [facility] from April of 2017 7. Status post PEG tube placement (September, 2016)        Cancer of tongue [facility])    Initial Diagnosis Cancer of tongue ([place])     INTERVAL HISTORY:  Grant Lee 60 y.o.  male pleasant patient above history of Recurrent colon cancer status post palliative radiation may 2017. Patient is currently on Opdivo is here for follow-up. Patient is currently status post 5 treatments/ Is here to review the results of his restaging PET scan.  His also met with GI this morning awaiting a CAT scan for the repositioning of his PEG tube.  Patient continues to do poorly. Complains of worsening pain in his right jaw continues use morphine; complains of poor appetite and poor by mouth intake. Positive weight loss. Diarrhea is improved.  He has chronic shortness of breath; chronic cough -  worsening.   REVIEW OF SYSTEMS:  A complete 10 point review of system is done which is negative except mentioned above/history of present illness.   PAST MEDICAL HISTORY :  Past Medical History  Diagnosis Date  . Hypertension   . Pneumonia     2004  . GERD (gastroesophageal reflux disease)   . Arthritis   . Anemia     blood transfusion 2012  . Bleeding ulcer   . Machinery accident     LEG INJURY  . Cancer ([place])     mouth 2006 then again 2016  . Protein-calorie malnutrition, severe ([place]) 07/05/2015  . Chronic pain syndrome 08/08/2015  . PUD (peptic ulcer disease) 08/08/2015    PAST SURGICAL HISTORY :   Past Surgical History  Procedure Laterality Date  . Leg surgery    . Joint replacement      right hip  . Tongue biopsy    . Tongue surgery      multiple surgeries  . Direct laryngoscopy N/A 06/26/2015    Procedure: Laryngoscopy with tongue base biopsy ;  Surgeon: Beverly Gust, MD;  Location: [facility] ORS;  Service: ENT;  Laterality: N/A;  . Esophagogastroduodenoscopy N/A 07/06/2015    Procedure: ESOPHAGOGASTRODUODENOSCOPY (EGD) and percutaneous gastrostomy tyube placement;  Surgeon: Lollie Sails, MD;  Location: [facility] ENDOSCOPY;  Service: Endoscopy;  Laterality: N/A;  Residual need to be done in the afternoon on 07/06/2015. Combined procedure with Dr. Gustavo Lah and Dr Rayann Heman  . Peripheral vascular catheterization N/A 07/27/2015    Procedure: Glori Luis Cath Insertion;  Surgeon: Katha Cabal, MD;  Location: [place] CV LAB;  Service: Cardiovascular;  Laterality: N/A;  . Esophagogastroduodenoscopy (egd) with propofol N/A 08/07/2015    Procedure: ESOPHAGOGASTRODUODENOSCOPY (EGD) WITH PROPOFOL;  Surgeon: Lollie Sails, MD;  Location: [facility] ENDOSCOPY;  Service: Endoscopy;  Laterality: N/A;  . Peg placement N/A 10/08/2015    Procedure: PERCUTANEOUS ENDOSCOPIC GASTROSTOMY (PEG) PLACEMENT;  Surgeon: Manya Silvas, MD;  Location: [facility] ENDOSCOPY;  Service: Endoscopy;  Laterality: N/A;     FAMILY HISTORY :   Family History  Problem Relation Age of Onset  . CAD Mother   . CAD Father     SOCIAL HISTORY:   Social History  Substance Use Topics  . Smoking status: Former Smoker -- 1.00 packs/day for 0 years    Types: Cigarettes    Quit date: 09/21/2014  . Smokeless tobacco: Not on file  . Alcohol Use: 7.2 oz/week    12 Cans of beer per week     Comment: beer/wine every day    ALLERGIES:  has No Known Allergies.  MEDICATIONS:  Current Outpatient Prescriptions  Medication Sig Dispense Refill  . diphenoxylate-atropine (LOMOTIL) 2.5-0.025 MG tablet 1 pill crushed every 4 hours as needed for diarrhea. Take it along with immodium 40 tablet 3  . fentaNYL (DURAGESIC - DOSED MCG/HR) 100 MCG/HR Place 1 patch (100 mcg total) onto the skin every 3 (three) days. hospice 10 patch 0  . fentaNYL (DURAGESIC - DOSED MCG/HR) 25 MCG/HR patch Place 1 patch (25 mcg total) onto the skin every 3 (three) days. Use with 114mg patch for total dose of 1210m. 10 patch 0  . morphine (ROXANOL) 20 MG/ML concentrated solution Use 1 ml through PEG tube every 2-3 hours for pain 240 mL 0  . Nutritional Supplements (FEEDING SUPPLEMENT, OSMOLITE 1.5 CAL,) LIQD Place 240 mLs into feeding tube 5 (five) times daily. 150 Bottle 3  . omeprazole (PRILOSEC) 20 MG capsule TAKE ONE CAPSULE BY MOUTH TWICE A DAY BEFORE MEALS 60 capsule 0  . phenol (CHLORASEPTIC) 1.4 % LIQD Use as directed 1 spray in the mouth or throat as needed for throat irritation / pain. 177 mL 0  . potassium chloride (KLOR-CON) 20 MEQ packet Take 20 mEq by mouth daily. 30 packet 2  . sucralfate (CARAFATE) 1 g tablet Take 1 tablet (1 g total) by mouth 3 (three) times daily. Dissolve each tablet in 2-3 tbsp warm water, swish and spit. 90 tablet 3  . zolpidem (AMBIEN) 10 MG tablet TAKE 1 TABLET BY MOUTH AT BEDTIME FOR SLEEP  2  . ondansetron (ZOFRAN) 4 MG tablet Take 1 tablet (4 mg total) by mouth every 4 (four) hours as needed for nausea or  vomiting. (Patient not taking: Reported on 05/29/2016) 45 tablet 0   No current facility-administered medications for this visit.   Facility-Administered Medications Ordered in Other Visits  Medication Dose Route Frequency Provider Last Rate Last Dose  . sodium chloride 0.9 % injection 10 mL  10 mL Intravenous PRN JaForest GleasonMD   10 mL at 10/24/15 1000  . sodium chloride flush (NS) 0.9 % injection 10 mL  10 mL Intravenous PRN JaForest GleasonMD   10 mL at 04/17/16 0929  . sodium chloride flush (NS) 0.9 % injection 10 mL  10 mL Intravenous PRN GoCammie SickleMD        PHYSICAL EXAMINATION: ECOG PERFORMANCE STATUS: 2 - Symptomatic, <50% confined to bed  BP 100/70 mmHg  Pulse 106  Temp(Src) 97.8 F (36.6 C) (Tympanic)  Resp 18  Ht 6' 2"  (1.88  m)  Wt 108 lb (48.988 kg)  BMI 13.86 kg/m2  Filed Weights   05/29/16 0852  Weight: 108 lb (48.988 kg)    GENERAL: Cachectic appearing Caucasian male patient Alert, no distress and comfortable.  Accompanied by his wife/daughter.He is in a wheelchair.   EYES: no pallor or icterus OROPHARYNX: Poor dentition no thrush  No lymphadenopathy in the cervical, axillary or inguinal regions LUNGS: clear to auscultation and  No wheeze or crackles HEART/CVS: regular rate & rhythm and no murmurs; No lower extremity edema ABDOMEN:abdomen soft, non-tender and normal bowel sounds Musculoskeletal:no cyanosis of digits and no clubbing  PSYCH: alert & oriented x 3 with fluent speech NEURO: no focal motor/sensory deficits SKIN:  no rashes or significant lesions  LABORATORY DATA:  I have reviewed the data as listed    Component Value Date/Time   NA 131* 05/29/2016 0839   NA 132* 02/20/2014 1732   K 3.9 05/29/2016 0839   K 3.7 02/20/2014 1732   CL 100* 05/29/2016 0839   CL 102 02/20/2014 1732   CO2 24 05/29/2016 0839   CO2 20* 02/20/2014 1732   GLUCOSE 102* 05/29/2016 0839   GLUCOSE 106* 02/20/2014 1732   BUN 9 05/29/2016 0839   BUN 6*  02/20/2014 1732   CREATININE 0.40* 05/29/2016 0839   CREATININE 0.77 02/20/2014 1732   CALCIUM 7.8* 05/29/2016 0839   CALCIUM 8.7 02/20/2014 1732   PROT 5.8* 05/29/2016 0839   PROT 7.9 02/20/2014 1732   ALBUMIN 2.2* 05/29/2016 0839   ALBUMIN 3.6 02/20/2014 1732   AST 13* 05/29/2016 0839   AST 56* 02/20/2014 1732   ALT 7* 05/29/2016 0839   ALT 45 02/20/2014 1732   ALKPHOS 91 05/29/2016 0839   ALKPHOS 89 02/20/2014 1732   BILITOT 0.4 05/29/2016 0839   BILITOT 0.6 02/20/2014 1732   GFRNONAA >60 05/29/2016 0839   GFRNONAA >60 02/20/2014 1732   GFRAA >60 05/29/2016 0839   GFRAA >60 02/20/2014 1732    No results found for: SPEP, UPEP  Lab Results  Component Value Date   WBC 13.1* 05/29/2016   NEUTROABS 9.8* 05/29/2016   HGB 11.2* 05/29/2016   HCT 33.6* 05/29/2016   MCV 80.7 05/29/2016   PLT 401 05/29/2016      Chemistry      Component Value Date/Time   NA 131* 05/29/2016 0839   NA 132* 02/20/2014 1732   K 3.9 05/29/2016 0839   K 3.7 02/20/2014 1732   CL 100* 05/29/2016 0839   CL 102 02/20/2014 1732   CO2 24 05/29/2016 0839   CO2 20* 02/20/2014 1732   BUN 9 05/29/2016 0839   BUN 6* 02/20/2014 1732   CREATININE 0.40* 05/29/2016 0839   CREATININE 0.77 02/20/2014 1732      Component Value Date/Time   CALCIUM 7.8* 05/29/2016 0839   CALCIUM 8.7 02/20/2014 1732   ALKPHOS 91 05/29/2016 0839   ALKPHOS 89 02/20/2014 1732   AST 13* 05/29/2016 0839   AST 56* 02/20/2014 1732   ALT 7* 05/29/2016 0839   ALT 45 02/20/2014 1732   BILITOT 0.4 05/29/2016 0839   BILITOT 0.6 02/20/2014 1732       RADIOGRAPHIC STUDIES: I have personally reviewed the radiological images as listed and agreed with the findings in the report. No results found.   ASSESSMENT & PLAN:  Cancer of tongue ([place]) #Metastatic/recurrent tongue cancer- currently on palliative therapy with Opdivo status post 5 treatments. PET scan shows residual but improved right neck malignancy.  # Patient  however  continues to decline clinically- significant weight loss and declining performance status. I recommend hospice.   # Diarrhea- ? Opdivo- improved on prednisone.  # PEG tube malfunction- awaiting CT scan with GI for repositioning. Discussed with Dr.Skulskie.   # Pain control- continue morphine liquid. New script given.   # I reviewed the blood work- with the patient in detail; also reviewed the imaging independently [as summarized above]; and with the patient in detail.   # 25 minutes face-to-face with the patient discussing the above plan of care; more than 50% of time spent on prognosis/ natural history; counseling and coordination.    No orders of the defined types were placed in this encounter.      Cammie Sickle, MD 05/29/2016 6:29 PM

## 2016-05-29 NOTE — Progress Notes (Signed)
Pt elected for Hospice referral.

## 2016-05-29 NOTE — Assessment & Plan Note (Addendum)
#  Metastatic/recurrent tongue cancer- currently on palliative therapy with Opdivo status post 5 treatments. PET scan shows residual but improved right neck malignancy.  # Patient however continues to decline clinically- significant weight loss and declining performance status. I recommend hospice.   # Diarrhea- ? Opdivo- improved on prednisone.  # PEG tube malfunction- awaiting CT scan with GI for repositioning. Discussed with Dr.Skulskie.   # Pain control- continue morphine liquid. New script given.   # I reviewed the blood work- with the patient in detail; also reviewed the imaging independently [as summarized above]; and with the patient in detail.   # 25 minutes face-to-face with the patient discussing the above plan of care; more than 50% of time spent on prognosis/ natural history; counseling and coordination.

## 2016-05-30 ENCOUNTER — Telehealth: Payer: Self-pay

## 2016-05-30 ENCOUNTER — Ambulatory Visit
Admission: RE | Admit: 2016-05-30 | Discharge: 2016-05-30 | Disposition: A | Discharge status: Home / Self Care | Payer: Medicare Other | Source: Ambulatory Visit | Attending: Gastroenterology | Admitting: Gastroenterology

## 2016-05-30 DIAGNOSIS — I10 Essential (primary) hypertension: Secondary | ICD-10-CM | POA: Diagnosis not present

## 2016-05-30 DIAGNOSIS — Z431 Encounter for attention to gastrostomy: Secondary | ICD-10-CM | POA: Diagnosis not present

## 2016-05-30 DIAGNOSIS — G894 Chronic pain syndrome: Secondary | ICD-10-CM | POA: Diagnosis not present

## 2016-05-30 DIAGNOSIS — D649 Anemia, unspecified: Secondary | ICD-10-CM | POA: Diagnosis not present

## 2016-05-30 DIAGNOSIS — J690 Pneumonitis due to inhalation of food and vomit: Secondary | ICD-10-CM | POA: Diagnosis not present

## 2016-05-30 DIAGNOSIS — Z85818 Personal history of malignant neoplasm of other sites of lip, oral cavity, and pharynx: Secondary | ICD-10-CM | POA: Diagnosis not present

## 2016-05-30 DIAGNOSIS — Z9981 Dependence on supplemental oxygen: Secondary | ICD-10-CM | POA: Diagnosis not present

## 2016-05-30 DIAGNOSIS — K259 Gastric ulcer, unspecified as acute or chronic, without hemorrhage or perforation: Secondary | ICD-10-CM | POA: Diagnosis not present

## 2016-05-30 DIAGNOSIS — M1990 Unspecified osteoarthritis, unspecified site: Secondary | ICD-10-CM | POA: Diagnosis not present

## 2016-05-30 DIAGNOSIS — E43 Unspecified severe protein-calorie malnutrition: Secondary | ICD-10-CM | POA: Diagnosis not present

## 2016-05-30 DIAGNOSIS — R1310 Dysphagia, unspecified: Secondary | ICD-10-CM | POA: Diagnosis not present

## 2016-05-30 DIAGNOSIS — C069 Malignant neoplasm of mouth, unspecified: Secondary | ICD-10-CM | POA: Diagnosis not present

## 2016-05-30 DIAGNOSIS — K219 Gastro-esophageal reflux disease without esophagitis: Secondary | ICD-10-CM | POA: Diagnosis not present

## 2016-05-30 NOTE — Telephone Encounter (Signed)
Debra from Hospice called from to inform us that per pt's wife's request a Hospice nurse will go out on Monday 7/24 between 1-2 pm Per pt's request.  Dr. Sharmaine Base Nurse aware.

## 2016-06-02 ENCOUNTER — Telehealth: Payer: Self-pay | Admitting: *Deleted

## 2016-06-02 MED ORDER — ALPRAZOLAM 0.25 MG PO TABS: 0.2500 mg | ORAL_TABLET | Freq: Two times a day (BID) | ORAL | 0 refills | Status: AC | PRN

## 2016-06-02 NOTE — Telephone Encounter (Signed)
Asking for order for O2, sat 89% and requesting med for anxiety.Please advise

## 2016-06-02 NOTE — Telephone Encounter (Signed)
Per Dr Roberts Gaudy, [place] for O2. Sonia Baller informed

## 2016-06-03 ENCOUNTER — Telehealth: Payer: Self-pay | Admitting: *Deleted

## 2016-06-03 NOTE — Telephone Encounter (Addendum)
Called to report that Trazadone has not worked in past and Ambien is not working currently; asking for order for Temazepam. Also reports that he is unable to tolerate the Osmolyte, he had 6-8 diarrhea stools last night. Asking if you want to order fluids via PEG tube since he will not be using Osmolyte.

## 2016-06-03 NOTE — Telephone Encounter (Signed)
Temazepam 15 mg qhs is fine with Dr. Jacinto Reap.  Has hospice been in touch and are they monitoring stools/fluids etc? Please have hospice nurse manage and give [place] a call with POC per Dr. Jacinto Reap.

## 2016-06-04 MED ORDER — TEMAZEPAM 15 MG PO CAPS: 15.0000 mg | ORAL_CAPSULE | Freq: Every evening | ORAL | 0 refills | Status: AC | PRN

## 2016-06-04 MED ORDER — JEVITY 1.2 CAL/FIBER PO LIQD: 240.0000 mL | Freq: Every day | ORAL | 2 refills | Status: AC

## 2016-06-04 NOTE — Telephone Encounter (Signed)
Per Dr Rogue Bussing try Jevity 1.5 5 cans per day, and give water 250 ml every 4 - 6 hours. Temazepam alright  Temazepam 15 mg 1 - 2 caps q hs as needed. Vicky informed

## 2016-06-16 ENCOUNTER — Other Ambulatory Visit: Payer: Self-pay | Admitting: Internal Medicine

## 2016-07-11 DEATH — deceased

## 2016-09-25 ENCOUNTER — Ambulatory Visit: Payer: Medicare Other | Admitting: Radiation Oncology

## 2016-10-18 ENCOUNTER — Other Ambulatory Visit: Payer: Self-pay | Admitting: Nurse Practitioner

## 2017-03-07 IMAGING — CR DG CHEST 2V
2 series · 2 of 2 positions shown · non-contrast
Comparison: 01/24/2015

CLINICAL DATA: Cough, history of tongue cancer

EXAM:
CHEST  2 VIEW

[chest pa]
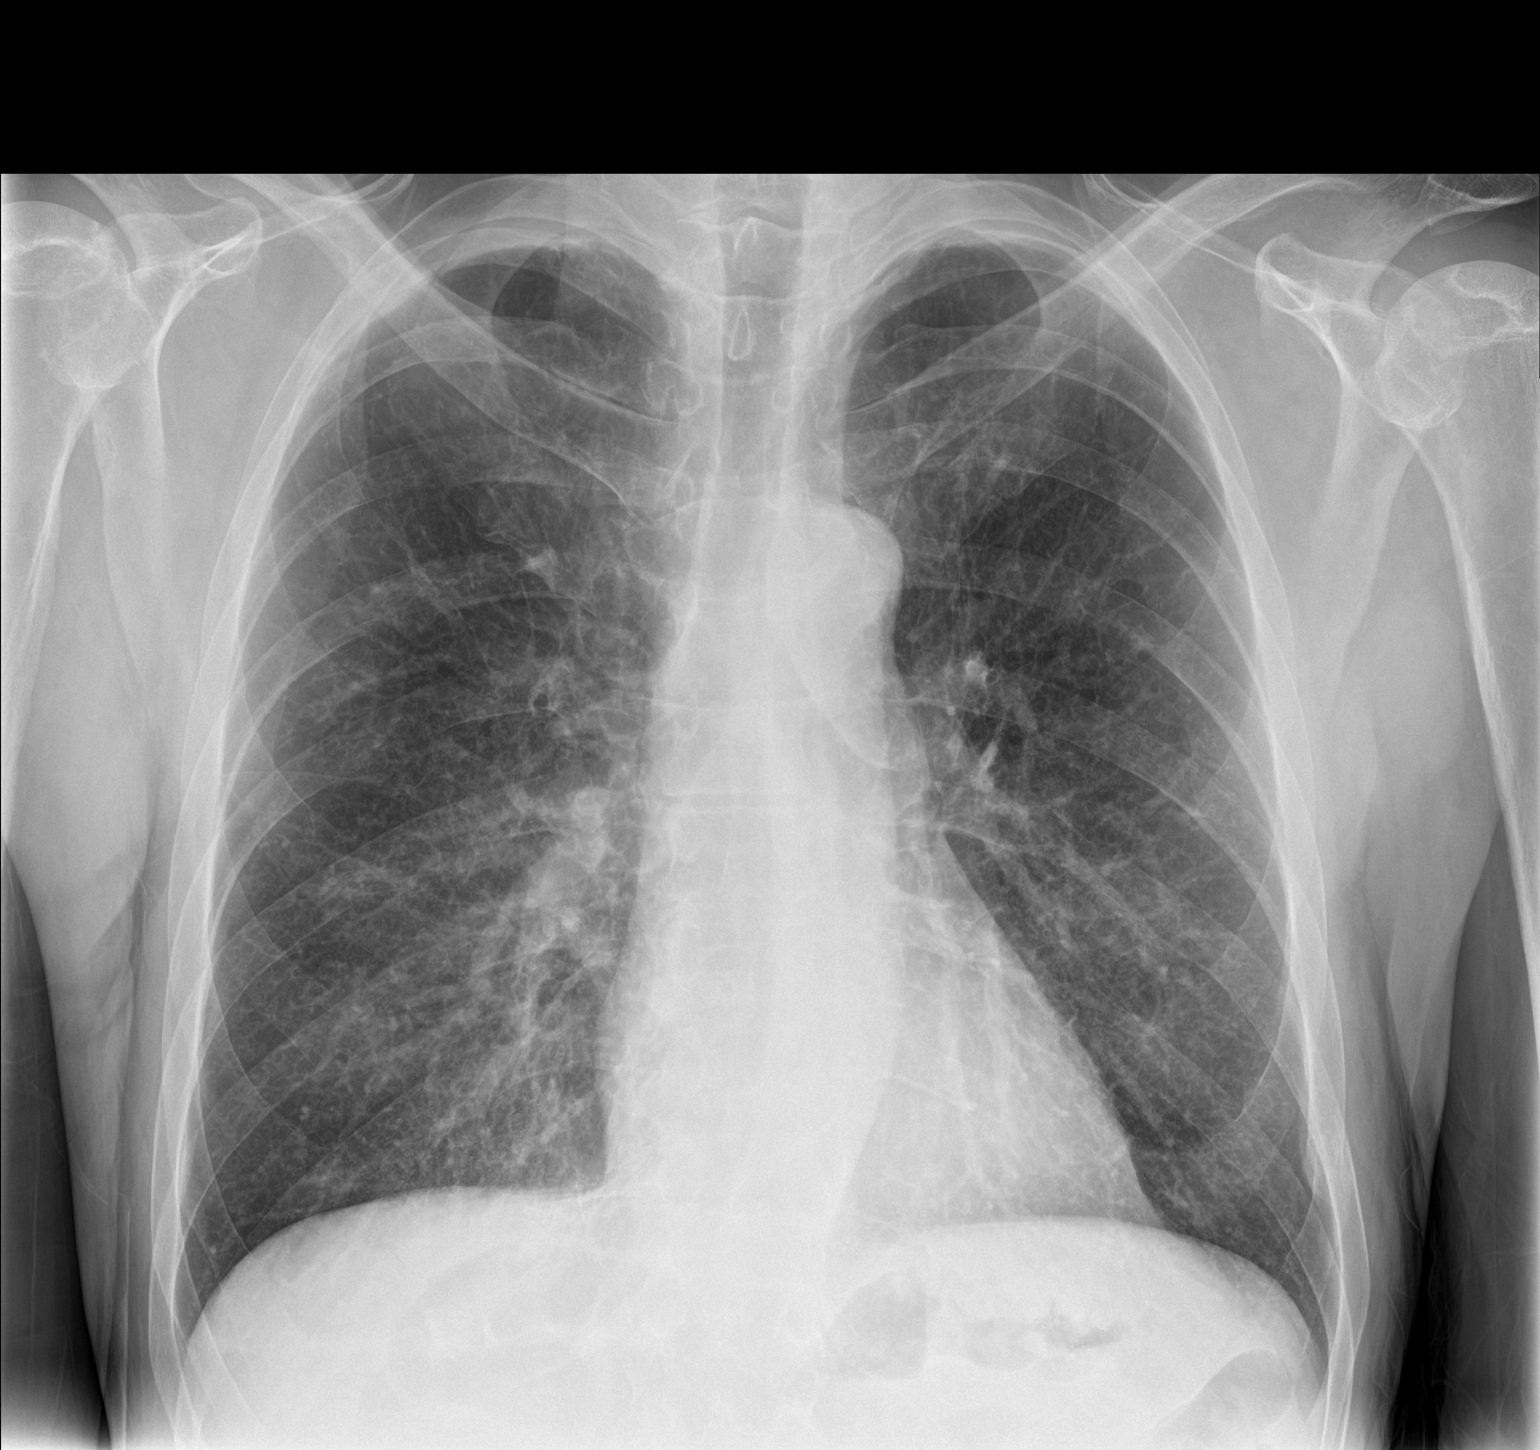

[chest lat]
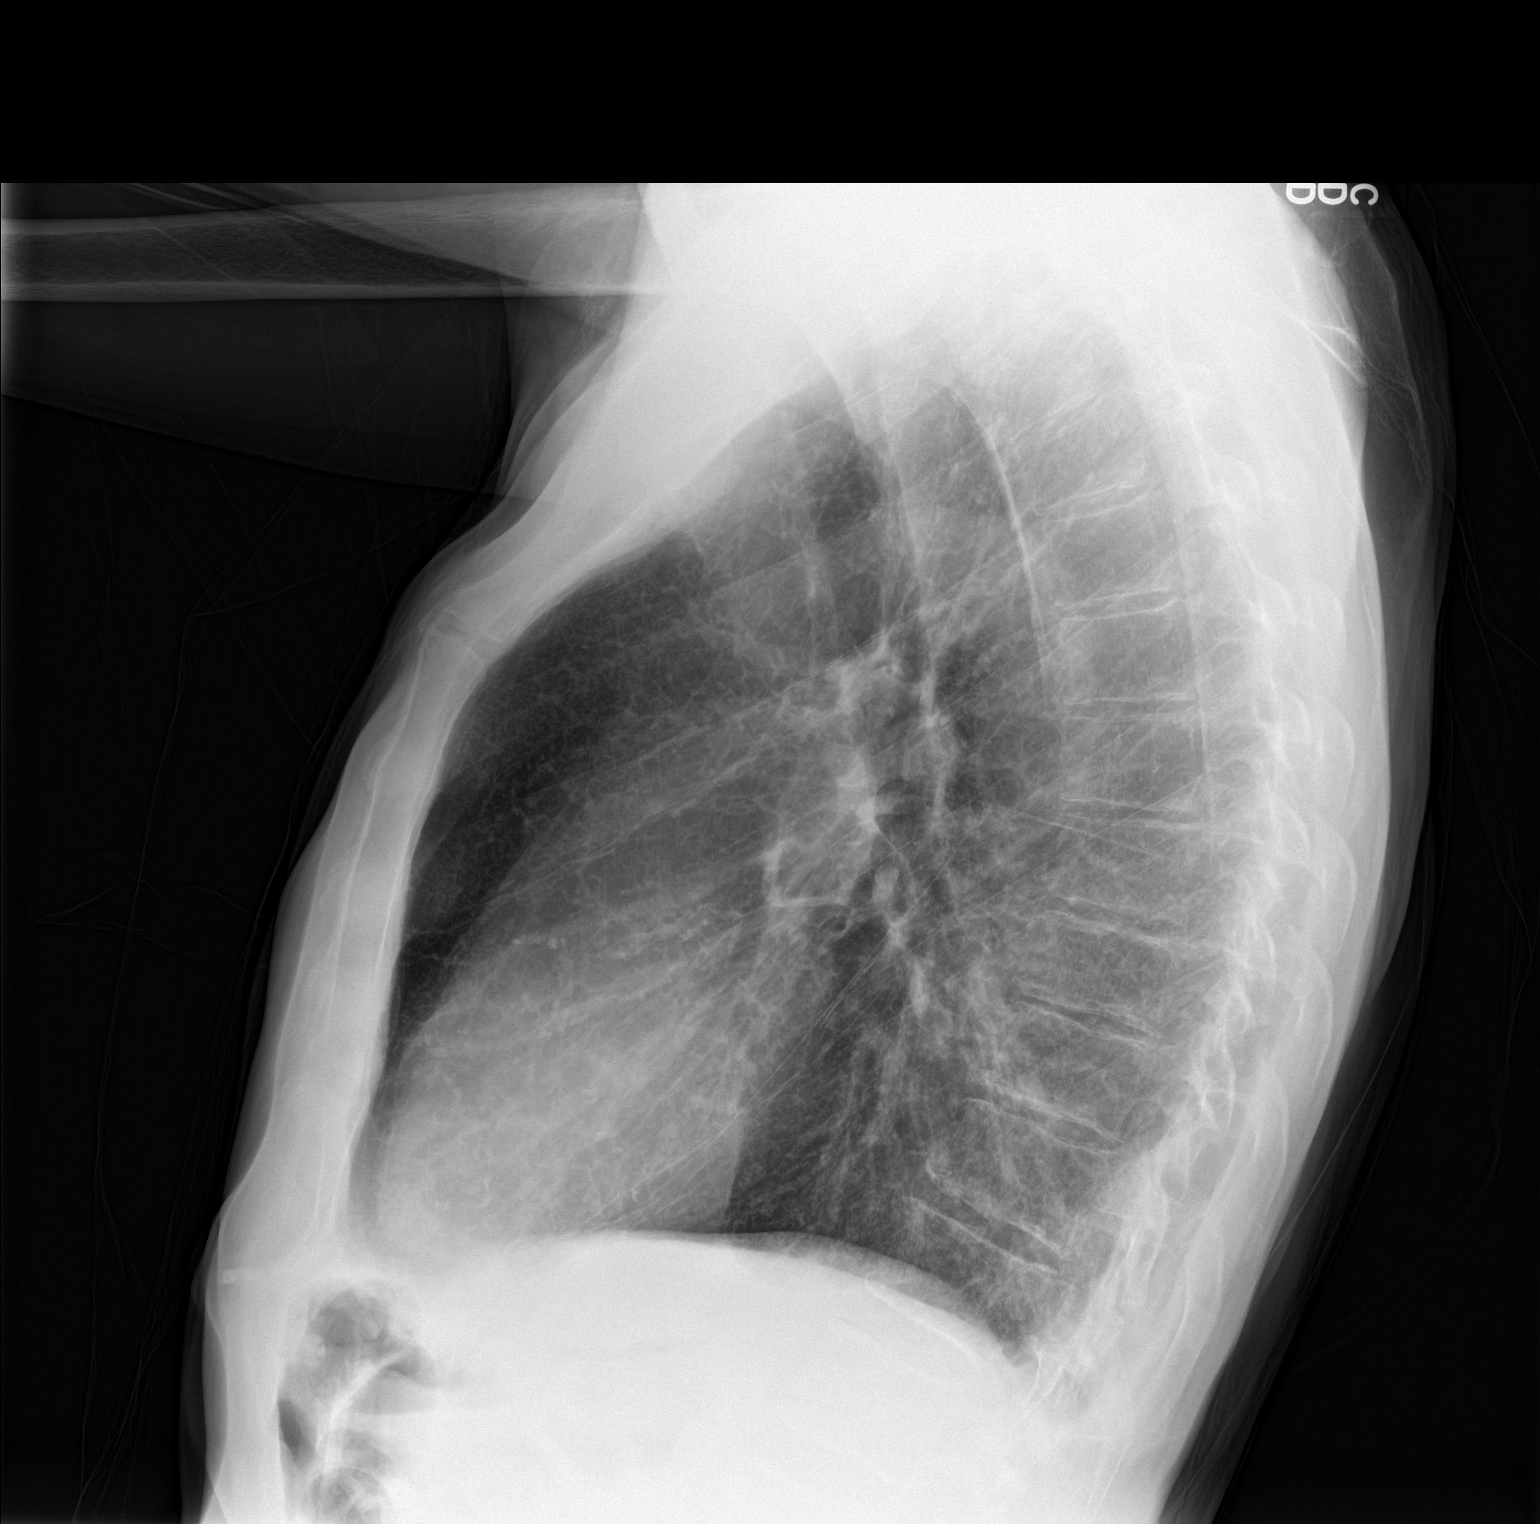

[2 of 2 positions shown; findings below may reference images not displayed]

FINDINGS: Cardiomediastinal silhouette is stable. Hyperinflation again noted.
Acute infiltrate or pulmonary edema. Stable chronic interstitial
prominence. Old left rib fracture again noted.
IMPRESSION: No active disease. Hyperinflation again noted. Stable chronic
interstitial prominence.

## 2017-03-14 IMAGING — CT CT ABD-PELV W/ CM
1 of 3 series · 14 of 32 positions shown, 19 images · IV contrast (omnipaque)
Comparison: PET-CT 06/14/1999 16

CLINICAL DATA: Head and neck cancer. Weight loss. Percutaneous
gastrostomy tube.

EXAM:
CT ABDOMEN AND PELVIS WITH CONTRAST
TECHNIQUE: Multidetector CT imaging of the abdomen and pelvis was performed
using the standard protocol following bolus administration of
intravenous contrast.
CONTRAST:  85mL OMNIPAQUE IOHEXOL 300 MG/ML  SOLN

[Series 2: routine abd pel with · axial · 0.72mm/px · z∈[-344,+66]mm · 14 of 92 slices shown, 19 images]
[im 5/92  soft-tissue]
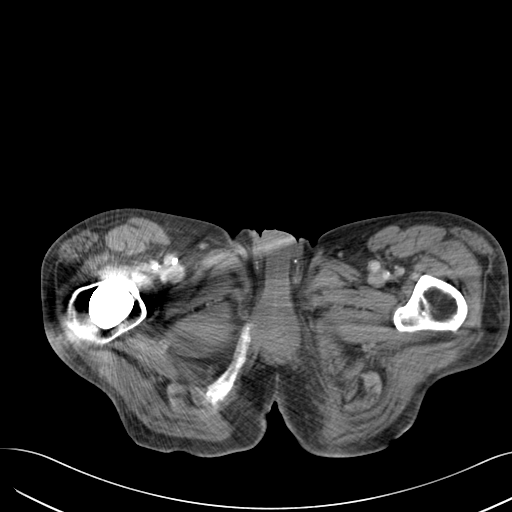
[im 5/92  bone]
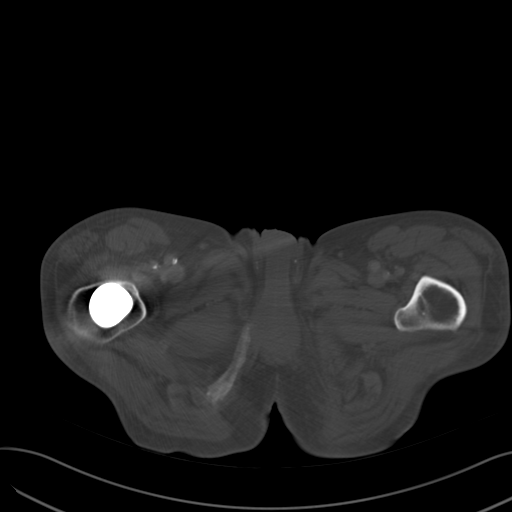
[im 15/92  soft-tissue]
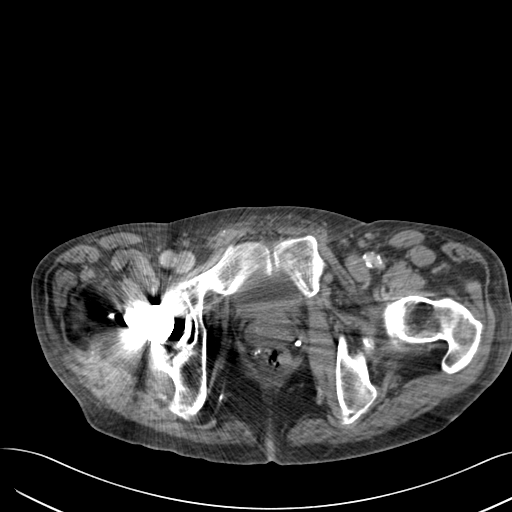
[im 20/92  soft-tissue]
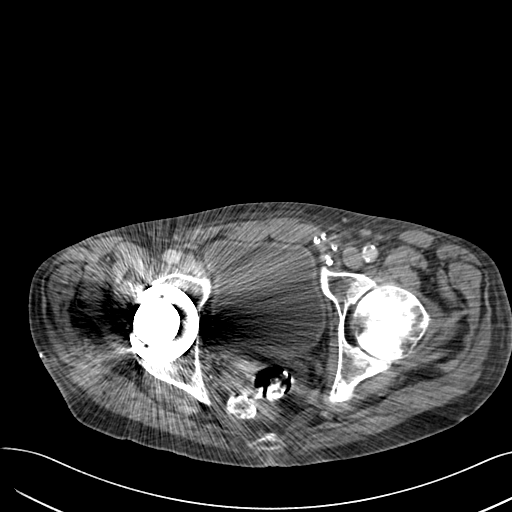
[im 24/92  soft-tissue]
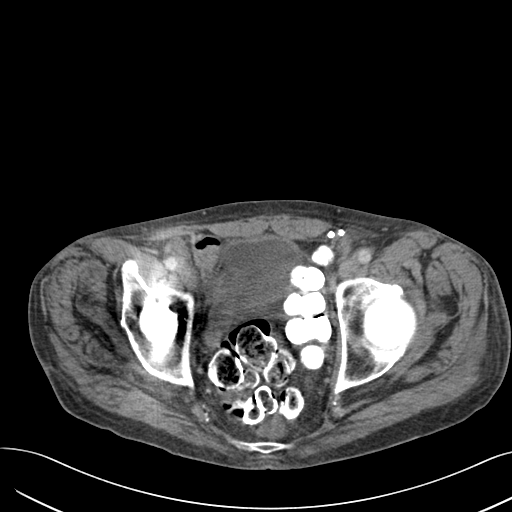
[im 34/92  soft-tissue]
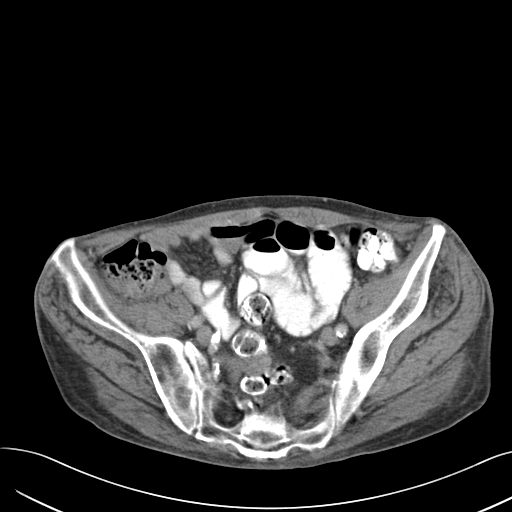
[im 39/92  soft-tissue]
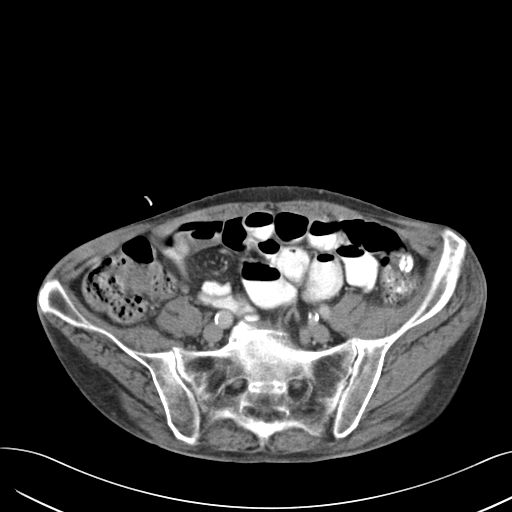
[im 48/92  soft-tissue]
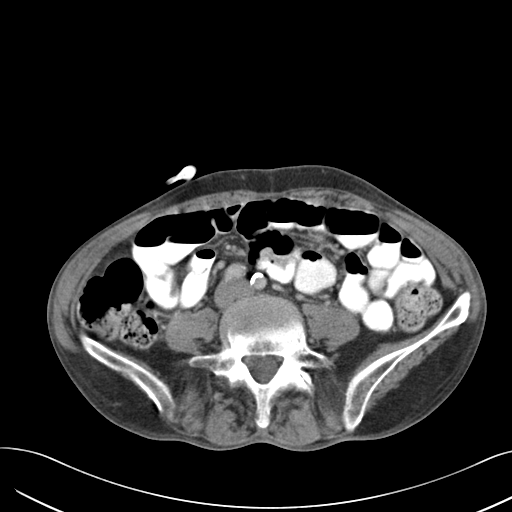
[im 53/92  soft-tissue]
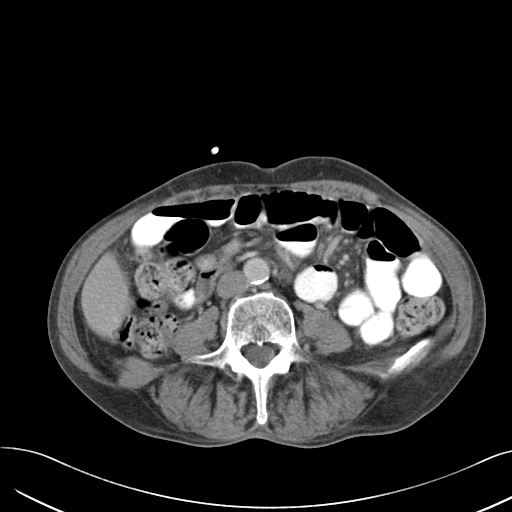
[im 58/92  soft-tissue]
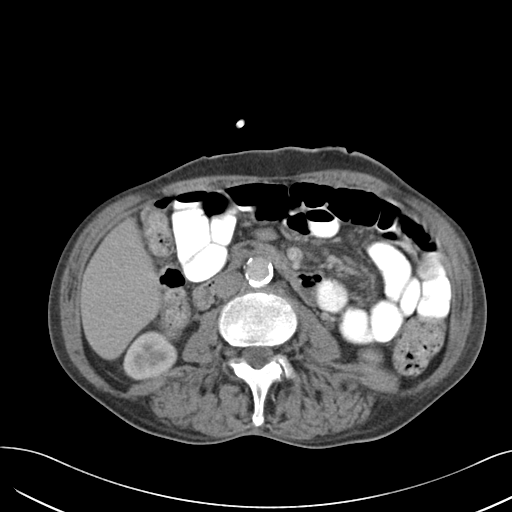
[im 58/92  bone]
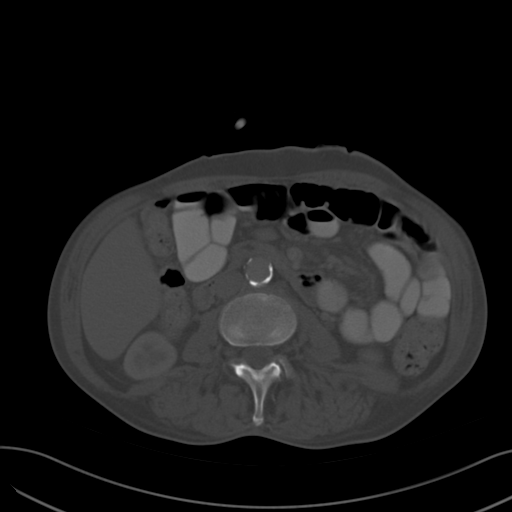
[im 68/92  soft-tissue]
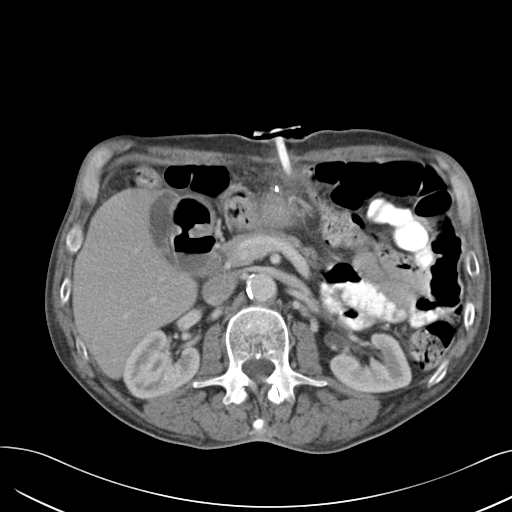
[im 72/92  soft-tissue]
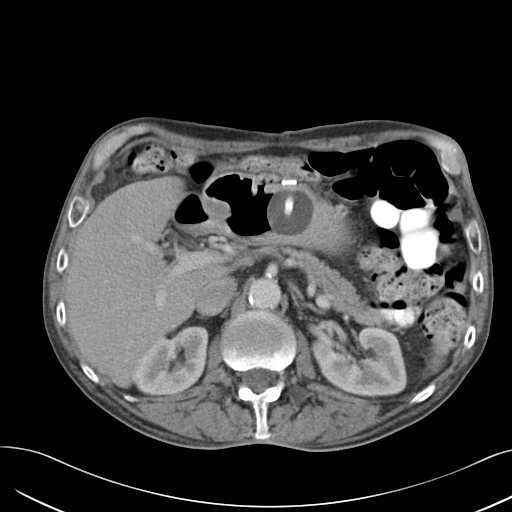
[im 72/92  lung]
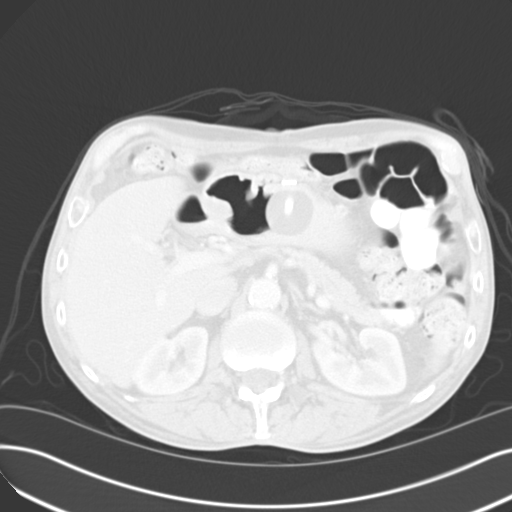
[im 77/92  soft-tissue]
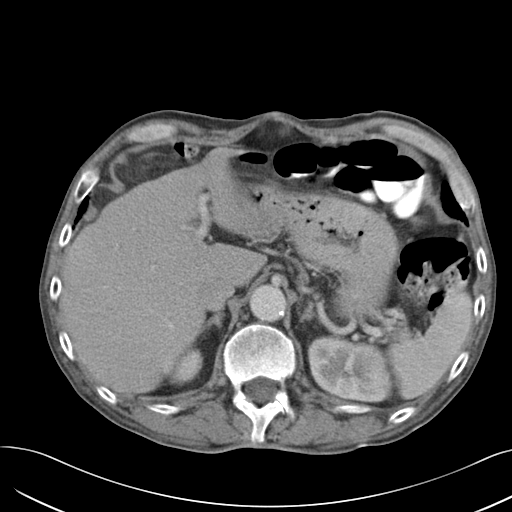
[im 77/92  lung]
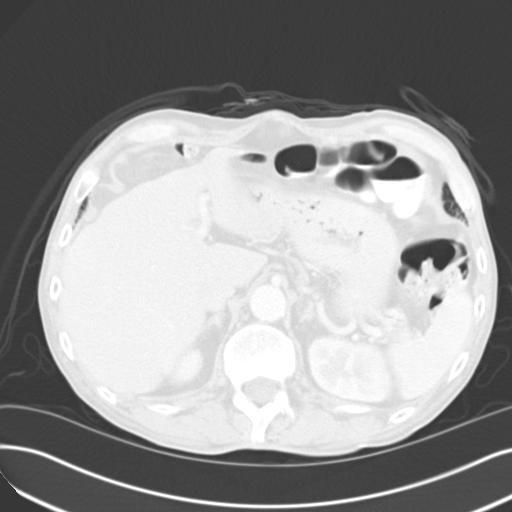
[im 82/92  lung]
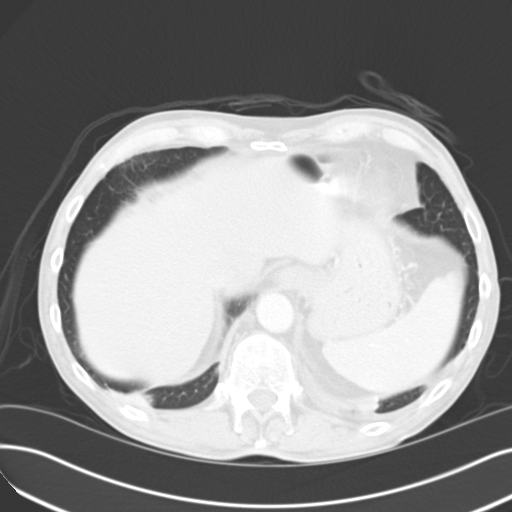
[im 87/92  soft-tissue]
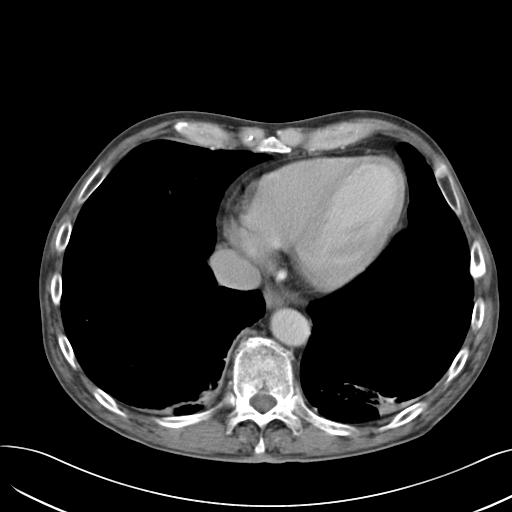
[im 87/92  lung]
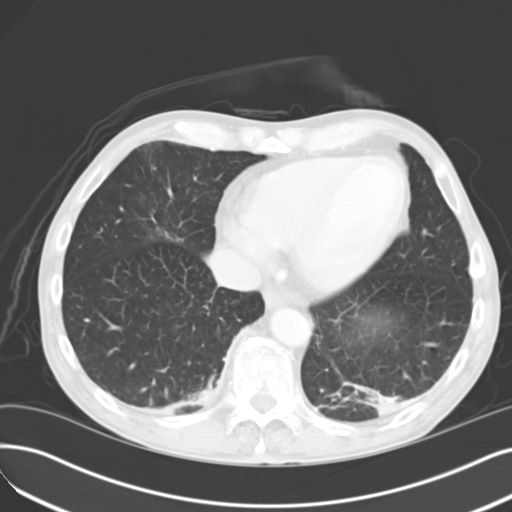

[14 of 32 positions shown; findings below may reference images not displayed]

FINDINGS: Lower chest: Bibasilar linear scarring at the lung bases. No
airspace disease to suggest active infection. Mild bronchiectasis
the lung bases.

Hepatobiliary: No focal hepatic lesion. No biliary duct dilatation.
Gallbladder is normal. Common bile duct is normal.

Pancreas: Pancreas is normal. No ductal dilatation. No pancreatic
inflammation.

Spleen: Normal spleen

Adrenals/urinary tract: Adrenal glands and kidneys are normal. The
ureters and bladder normal.

Stomach/Bowel: Percutaneous gastrostomy tube within the stomach. The
retention bulb is positioned properly within the gastric body. No
evidence of leak along the course of the gastrostomy tube. Contrast
flows distally through the small bowel without obstruction. The
appendix and cecum are normal. The colon and rectosigmoid colon are
normal.

Vascular/Lymphatic: Abdominal aorta is normal caliber with
atherosclerotic calcification. There is no retroperitoneal or
periportal lymphadenopathy. No pelvic lymphadenopathy.

Reproductive: Prostate gland is normal.

Musculoskeletal: RIGHT hip arthroplasty. Avascular necrosis of the
LEFT femoral head. No aggressive osseous lesion.

Other: No free fluid.
IMPRESSION: 1. Bibasilar linear scarring may well represent sequelae of prior
aspiration pneumonitis; however there is no evidence of active
pneumonia or pneumonitis.
2. Percutaneous gastrostomy tube appears in good position without
evidence of malposition or infection.
3. No bowel obstruction.
4. No evidence of abscess or inflammation the abdomen or pelvis.
5.  Atherosclerotic calcification of the abdominal aorta.
